# Patient Record
Sex: Female | Born: 1935 | Race: White | Hispanic: Yes | Smoking: Never smoker
Health system: Southern US, Community
[De-identification: ages and names within clinical notes are randomized; demographics above are authoritative.]

## PROBLEM LIST (undated history)

## (undated) DIAGNOSIS — I495 Sick sinus syndrome: Secondary | ICD-10-CM

## (undated) DIAGNOSIS — S329XXA Fracture of unspecified parts of lumbosacral spine and pelvis, initial encounter for closed fracture: Secondary | ICD-10-CM

## (undated) DIAGNOSIS — E785 Hyperlipidemia, unspecified: Secondary | ICD-10-CM

## (undated) DIAGNOSIS — S72112A Displaced fracture of greater trochanter of left femur, initial encounter for closed fracture: Secondary | ICD-10-CM

## (undated) DIAGNOSIS — I10 Essential (primary) hypertension: Secondary | ICD-10-CM

## (undated) DIAGNOSIS — R296 Repeated falls: Secondary | ICD-10-CM

## (undated) DIAGNOSIS — F419 Anxiety disorder, unspecified: Secondary | ICD-10-CM

## (undated) DIAGNOSIS — E039 Hypothyroidism, unspecified: Secondary | ICD-10-CM

## (undated) DIAGNOSIS — I480 Paroxysmal atrial fibrillation: Secondary | ICD-10-CM

## (undated) DIAGNOSIS — F329 Major depressive disorder, single episode, unspecified: Secondary | ICD-10-CM

## (undated) DIAGNOSIS — S72002A Fracture of unspecified part of neck of left femur, initial encounter for closed fracture: Secondary | ICD-10-CM

## (undated) DIAGNOSIS — E559 Vitamin D deficiency, unspecified: Secondary | ICD-10-CM

## (undated) DIAGNOSIS — Z950 Presence of cardiac pacemaker: Secondary | ICD-10-CM

## (undated) DIAGNOSIS — M810 Age-related osteoporosis without current pathological fracture: Secondary | ICD-10-CM

## (undated) DIAGNOSIS — J309 Allergic rhinitis, unspecified: Secondary | ICD-10-CM

## (undated) DIAGNOSIS — S0300XA Dislocation of jaw, unspecified side, initial encounter: Secondary | ICD-10-CM

## (undated) DIAGNOSIS — I639 Cerebral infarction, unspecified: Secondary | ICD-10-CM

## (undated) DIAGNOSIS — F32A Depression, unspecified: Secondary | ICD-10-CM

## (undated) DIAGNOSIS — N189 Chronic kidney disease, unspecified: Secondary | ICD-10-CM

## (undated) HISTORY — DX: Essential (primary) hypertension: I10

## (undated) HISTORY — DX: Major depressive disorder, single episode, unspecified: F32.9

## (undated) HISTORY — PX: TONSILLECTOMY AND ADENOIDECTOMY: SHX28

## (undated) HISTORY — DX: Depression, unspecified: F32.A

## (undated) HISTORY — DX: Hyperlipidemia, unspecified: E78.5

## (undated) HISTORY — DX: Chronic kidney disease, unspecified: N18.9

## (undated) HISTORY — DX: Presence of cardiac pacemaker: Z95.0

## (undated) HISTORY — DX: Sick sinus syndrome: I49.5

## (undated) HISTORY — DX: Hypothyroidism, unspecified: E03.9

## (undated) HISTORY — DX: Vitamin D deficiency, unspecified: E55.9

## (undated) HISTORY — PX: GALLBLADDER SURGERY: SHX652

## (undated) HISTORY — DX: Age-related osteoporosis without current pathological fracture: M81.0

## (undated) HISTORY — DX: Repeated falls: R29.6

## (undated) HISTORY — DX: Anxiety disorder, unspecified: F41.9

## (undated) HISTORY — DX: Paroxysmal atrial fibrillation: I48.0

## (undated) HISTORY — PX: CHOLECYSTECTOMY: SHX55

---

## 2005-01-04 HISTORY — PX: PACEMAKER INSERTION: SHX728

## 2010-09-23 ENCOUNTER — Ambulatory Visit: Payer: Medicare (Managed Care) | Attending: Orthopaedic Surgery | Admitting: Occupational Therapy

## 2010-09-23 DIAGNOSIS — IMO0001 Reserved for inherently not codable concepts without codable children: Secondary | ICD-10-CM | POA: Insufficient documentation

## 2010-09-23 DIAGNOSIS — M6281 Muscle weakness (generalized): Secondary | ICD-10-CM | POA: Insufficient documentation

## 2010-09-23 DIAGNOSIS — R269 Unspecified abnormalities of gait and mobility: Secondary | ICD-10-CM | POA: Insufficient documentation

## 2010-09-24 ENCOUNTER — Ambulatory Visit: Payer: Medicare (Managed Care) | Admitting: Occupational Therapy

## 2010-09-30 ENCOUNTER — Ambulatory Visit: Payer: Medicare (Managed Care) | Admitting: Occupational Therapy

## 2010-10-08 ENCOUNTER — Ambulatory Visit: Payer: Medicare (Managed Care) | Admitting: Occupational Therapy

## 2010-10-13 ENCOUNTER — Ambulatory Visit: Payer: Medicare (Managed Care) | Admitting: Occupational Therapy

## 2010-10-15 ENCOUNTER — Ambulatory Visit: Payer: Medicare (Managed Care) | Admitting: Occupational Therapy

## 2010-10-18 ENCOUNTER — Ambulatory Visit: Payer: Medicare (Managed Care) | Admitting: Occupational Therapy

## 2010-10-20 ENCOUNTER — Ambulatory Visit: Payer: Medicare (Managed Care) | Admitting: Occupational Therapy

## 2010-10-26 ENCOUNTER — Ambulatory Visit: Payer: Medicare Other | Attending: Orthopaedic Surgery | Admitting: Occupational Therapy

## 2010-10-26 DIAGNOSIS — IMO0001 Reserved for inherently not codable concepts without codable children: Secondary | ICD-10-CM | POA: Insufficient documentation

## 2010-10-26 DIAGNOSIS — M6281 Muscle weakness (generalized): Secondary | ICD-10-CM | POA: Insufficient documentation

## 2010-10-26 DIAGNOSIS — R269 Unspecified abnormalities of gait and mobility: Secondary | ICD-10-CM | POA: Insufficient documentation

## 2010-10-28 ENCOUNTER — Ambulatory Visit: Payer: Medicare Other | Admitting: Physical Therapy

## 2010-10-28 ENCOUNTER — Inpatient Hospital Stay (INDEPENDENT_AMBULATORY_CARE_PROVIDER_SITE_OTHER)
Admission: RE | Admit: 2010-10-28 | Discharge: 2010-10-28 | Disposition: A | Discharge status: Home / Self Care | Payer: Medicare Other | Source: Ambulatory Visit | Attending: Emergency Medicine | Admitting: Emergency Medicine

## 2010-10-28 ENCOUNTER — Ambulatory Visit: Payer: Medicare Other | Admitting: Occupational Therapy

## 2010-10-28 DIAGNOSIS — I10 Essential (primary) hypertension: Secondary | ICD-10-CM

## 2010-10-28 LAB — POCT I-STAT, CHEM 8
Calcium, Ion: 1.2 mmol/L (ref 1.12–1.32)
Creatinine, Ser: 0.8 mg/dL (ref 0.50–1.10)
Glucose, Bld: 103 mg/dL — ABNORMAL HIGH (ref 70–99)
Hemoglobin: 15.3 g/dL — ABNORMAL HIGH (ref 12.0–15.0)
Sodium: 142 mEq/L (ref 135–145)
TCO2: 28 mmol/L (ref 0–100)

## 2010-11-02 ENCOUNTER — Ambulatory Visit: Payer: Medicare Other | Admitting: Occupational Therapy

## 2010-11-05 ENCOUNTER — Ambulatory Visit: Payer: Medicare Other | Admitting: Physical Therapy

## 2010-11-05 ENCOUNTER — Ambulatory Visit: Payer: Medicare Other | Admitting: Occupational Therapy

## 2010-11-09 ENCOUNTER — Ambulatory Visit: Payer: Medicare Other | Admitting: Physical Therapy

## 2010-11-09 ENCOUNTER — Ambulatory Visit: Payer: Medicare Other | Admitting: Occupational Therapy

## 2010-11-12 ENCOUNTER — Ambulatory Visit: Payer: Medicare Other | Admitting: Occupational Therapy

## 2010-11-16 ENCOUNTER — Ambulatory Visit: Payer: Medicare Other | Admitting: Physical Therapy

## 2010-11-16 ENCOUNTER — Ambulatory Visit: Payer: Medicare Other | Admitting: Occupational Therapy

## 2010-11-18 ENCOUNTER — Ambulatory Visit: Payer: Medicare Other | Admitting: Physical Therapy

## 2010-11-18 ENCOUNTER — Ambulatory Visit: Payer: Medicare Other | Admitting: Occupational Therapy

## 2010-11-23 ENCOUNTER — Ambulatory Visit: Payer: Medicare Other | Admitting: Physical Therapy

## 2010-11-23 ENCOUNTER — Ambulatory Visit: Payer: Medicare Other | Attending: Orthopaedic Surgery | Admitting: Occupational Therapy

## 2010-11-23 DIAGNOSIS — R269 Unspecified abnormalities of gait and mobility: Secondary | ICD-10-CM | POA: Insufficient documentation

## 2010-11-23 DIAGNOSIS — IMO0001 Reserved for inherently not codable concepts without codable children: Secondary | ICD-10-CM | POA: Insufficient documentation

## 2010-11-23 DIAGNOSIS — M6281 Muscle weakness (generalized): Secondary | ICD-10-CM | POA: Insufficient documentation

## 2010-11-25 ENCOUNTER — Ambulatory Visit: Payer: Medicare Other | Admitting: Occupational Therapy

## 2010-11-25 ENCOUNTER — Ambulatory Visit: Payer: Medicare Other | Admitting: Physical Therapy

## 2010-11-26 ENCOUNTER — Ambulatory Visit: Payer: Medicare Other | Admitting: Family Medicine

## 2010-11-30 ENCOUNTER — Ambulatory Visit: Payer: Medicare Other | Admitting: Occupational Therapy

## 2010-11-30 ENCOUNTER — Ambulatory Visit: Payer: Medicare Other | Admitting: Physical Therapy

## 2010-12-03 ENCOUNTER — Ambulatory Visit: Payer: Medicare Other | Admitting: Physical Therapy

## 2010-12-03 ENCOUNTER — Ambulatory Visit: Payer: Medicare Other | Admitting: Occupational Therapy

## 2010-12-06 ENCOUNTER — Telehealth: Payer: Self-pay | Admitting: Family Medicine

## 2010-12-06 NOTE — Telephone Encounter (Signed)
I have no record that this patient is established with our practice.  I am unable to offer medical advice to people who are not yet established patients. Yvonne Buck

## 2010-12-06 NOTE — Telephone Encounter (Signed)
Yvonne Buck has an appt on Friday with Dr. Mauricio Po.  She had a fall on Friday and went to an Orthopedic MD for a fractured left arm.  She has also hit her head and now has headaches.  The daughter would like to talk to Dr. Mauricio Po.

## 2010-12-06 NOTE — Telephone Encounter (Signed)
Fwd. To Dr.Breen .Latorsha Curling  

## 2010-12-07 ENCOUNTER — Ambulatory Visit: Payer: Medicare Other | Admitting: Occupational Therapy

## 2010-12-07 ENCOUNTER — Ambulatory Visit: Payer: Medicare Other | Admitting: Physical Therapy

## 2010-12-09 ENCOUNTER — Ambulatory Visit: Payer: Medicare Other | Admitting: Physical Therapy

## 2010-12-09 ENCOUNTER — Ambulatory Visit: Payer: Medicare Other | Admitting: Occupational Therapy

## 2010-12-10 ENCOUNTER — Ambulatory Visit (INDEPENDENT_AMBULATORY_CARE_PROVIDER_SITE_OTHER): Payer: Medicare Other | Admitting: Family Medicine

## 2010-12-10 ENCOUNTER — Encounter: Payer: Self-pay | Admitting: Family Medicine

## 2010-12-10 DIAGNOSIS — E039 Hypothyroidism, unspecified: Secondary | ICD-10-CM

## 2010-12-10 DIAGNOSIS — W19XXXA Unspecified fall, initial encounter: Secondary | ICD-10-CM | POA: Insufficient documentation

## 2010-12-10 DIAGNOSIS — J45909 Unspecified asthma, uncomplicated: Secondary | ICD-10-CM | POA: Insufficient documentation

## 2010-12-10 DIAGNOSIS — I10 Essential (primary) hypertension: Secondary | ICD-10-CM | POA: Insufficient documentation

## 2010-12-10 LAB — COMPREHENSIVE METABOLIC PANEL
Alkaline Phosphatase: 66 U/L (ref 39–117)
BUN: 33 mg/dL — ABNORMAL HIGH (ref 6–23)
Creat: 0.68 mg/dL (ref 0.50–1.10)
Glucose, Bld: 117 mg/dL — ABNORMAL HIGH (ref 70–99)
Sodium: 143 mEq/L (ref 135–145)
Total Bilirubin: 0.4 mg/dL (ref 0.3–1.2)

## 2010-12-10 LAB — CBC
HCT: 41.5 % (ref 36.0–46.0)
Hemoglobin: 12.9 g/dL (ref 12.0–15.0)
MCV: 89.4 fL (ref 78.0–100.0)
RBC: 4.64 MIL/uL (ref 3.87–5.11)
WBC: 8 10*3/uL (ref 4.0–10.5)

## 2010-12-10 MED ORDER — CLONIDINE HCL 0.1 MG PO TABS: 0.1000 mg | ORAL_TABLET | Freq: Every day | ORAL | Status: DC

## 2010-12-10 MED ORDER — ALBUTEROL SULFATE HFA 108 (90 BASE) MCG/ACT IN AERS: 2.0000 | INHALATION_SPRAY | Freq: Four times a day (QID) | RESPIRATORY_TRACT | Status: DC | PRN

## 2010-12-10 MED ORDER — FLUTICASONE PROPIONATE (INHAL) 50 MCG/BLIST IN AEPB: 1.0000 | INHALATION_SPRAY | Freq: Two times a day (BID) | RESPIRATORY_TRACT | Status: DC

## 2010-12-10 MED ORDER — MAGNESIUM 250 MG PO TABS: 1.0000 | ORAL_TABLET | Freq: Once | ORAL | Status: DC | PRN

## 2010-12-10 MED ORDER — ASPIRIN 81 MG PO TBDP: 81.0000 mg | ORAL_TABLET | Freq: Every day | ORAL | Status: AC

## 2010-12-10 MED ORDER — HYDROCHLOROTHIAZIDE 25 MG PO TABS: 25.0000 mg | ORAL_TABLET | Freq: Every day | ORAL | Status: DC

## 2010-12-10 MED ORDER — LEVOTHYROXINE SODIUM 25 MCG PO TABS: 25.0000 ug | ORAL_TABLET | Freq: Every day | ORAL | Status: DC

## 2010-12-10 MED ORDER — NITROGLYCERIN 0.4 MG SL SUBL: 0.4000 mg | SUBLINGUAL_TABLET | SUBLINGUAL | Status: DC | PRN

## 2010-12-10 MED ORDER — B-12 2000 MCG PO TABS: 1.0000 | ORAL_TABLET | Freq: Every day | ORAL | Status: DC

## 2010-12-10 MED ORDER — LOSARTAN POTASSIUM 50 MG PO TABS: 50.0000 mg | ORAL_TABLET | Freq: Every day | ORAL | Status: DC

## 2010-12-10 MED ORDER — THEOPHYLLINE ER 400 MG PO TB24: 400.0000 mg | ORAL_TABLET | Freq: Every day | ORAL | Status: DC

## 2010-12-10 MED ORDER — AMLODIPINE BESYLATE 5 MG PO TABS: 5.0000 mg | ORAL_TABLET | Freq: Every day | ORAL | Status: DC

## 2010-12-10 MED ORDER — SIMVASTATIN 40 MG PO TABS: 40.0000 mg | ORAL_TABLET | Freq: Every evening | ORAL | Status: DC

## 2010-12-10 NOTE — Assessment & Plan Note (Addendum)
Patient reports that her hypertension has been very difficult to control. She reports that without the clonidine she has pressures in the 190s. She has a pacemaker since 2006 and is unable to give me more details about this. Again, we will request records from her primary physician in [place] and she plans to establish cardiac consultation with Dr. Eden Emms, who is her daughter's cardiologist as well. We are checking a direct LDL as well as a CBC and C&S on her today, and refilling her hydrochlorothiazide, amlodipine, Cozaar, and Catapres.

## 2010-12-10 NOTE — Progress Notes (Signed)
  Subjective:    Patient ID: Yvonne Buck, female    DOB: 1929/11/08, 77 y.o.   MRN: [id number]  HPI Ms. Broers comes in today for a new patient visit. She is accompanied by her daughter. She moved here from [place] about 2 months ago and comes to establish care and get refills on her chronic medications. Her previous doctor in [place] was Dr. Ramond Marrow. Her primary concern today is that she fell on October 12 and hit the left side of her head when getting out of her car in the garage. She feels that this was a trip and fall he was not preceded by any feeling of presyncope he was not preceded by any palpitations or chest pain.  She banged the left side of her head and had a knot on her head which has receded in the ensuing week. Prior to this fall she had fallen one other time in July in [place] and had fractured her left forearm requiring orthopedic surgery and hardware. She has seen an orthopedist here in [place] since moving here.  Additionally she had a pacemaker placed in [place] in 2006 and would like to establish with a cardiologist to have her pacemaker checked. Her daughter is a patient of Dr. Eden Emms on and she would like to he seen by him.  Her chronic medical conditions include hypertension which she reports has been difficult to control. She also has a long history of asthma and previously was using Flovent discus 250 mcg daily as well as Symbicort but has not needed to take these medications since moving to [place]. She has had hyperlipidemia and hypothyroidism in the past as well. The only medication allergy she reports is penicillin which gives her rashes.  Social history: The patient recently moved from [place] to [place]. She has never been a smoker. She does not drink alcohol. She is a widow.  Family history: Patient's mother died at age 71 and had diabetes. She has a brother who also has died and he too had diabetes. She has a sister who died of a MI.  Review  of Systems Her review of systems is negative for weight changes fevers or chills chest pain or pressure palpitations shortness of breath cough sputum production abdominal pain dysuria loss of control of bowel or bladder. She denies diplopia or headache or neck pain since her fall.    Objective:   Physical Exam Well-appearing in no acute distress able to get up on the exam table without assistance.  HEENT neck is supple without cervical adenopathy there is a small fleshy bump just to the left of midline at the corona it is nontender there is no ecchymosis. Her extraocular muscles are intact her pupils are round and reactive to light. Heart: Regular S1-S2 without any extra sounds or murmurs appreciated  Pulmonary: Good air movement with clear breath sounds bilaterally no wheezes rales or rhonchi are appreciated.  Abdomen soft nontender and without masses.  Neurologic: Full handgrip symmetrically, sensation in both hands and feet is grossly full and symmetrical. Gait is unremarkable. Walks without assistance.  Musculoskeletal: She has positive dorsalis pedis pulses in both feet without any ankle edema. Full-strength with dorsiflexion and plantar flexion of both feet which are symmetrical. She has no tenderness in her cervical spine, on her head, or with shoulder shrug.        Assessment & Plan:

## 2010-12-10 NOTE — Assessment & Plan Note (Signed)
Patient has suffered 2 falls in the past 6 months, both of which appeared to have been mechanical falls. This fall in July occurred in [place] while she was performing yard work with heavy long instruments and did not could not maintain her balance. The fall last week occurred while getting out of her car possibly with some mechanical obstructions limiting her movement. She gives no history of palpitations or presyncopal events.  We may consider a physical therapy consult for further assessment if she continues to have problems with unsteadiness or any future falls. She reports that she has had a DEXA scan that I determined that she had normal bone density 2 years ago in [place] We will request records from that DEXA scan as well as from her primary doctor in [place]

## 2010-12-10 NOTE — Patient Instructions (Signed)
Fue un Marketing executive.  Estoy mandando hacer una serie de laboratorios y Engineer, maintenance (IT) contacto con los [place].  QUiero [employer] records de sus medicos en [place] sobretodo de su medico de atencion primaria Dr Ramond Marrow, y [place] del estudio de la densidad de los huesos que se hizo Tax adviser.  Recomiendo que haga una cita con el Dr. Eden Emms para atenderle al [place].  RELEASE OF INFORMATION FOR DR VALE, PRIOR PRIMARY CARE DOCTOR.   SPECIFY BONE DENSITY REPORT OF 2 YRS AGO.

## 2010-12-10 NOTE — Assessment & Plan Note (Signed)
Patient reports she has been on a low dose of Levophed her oxygen for about one year. She says she has never had her TSH rechecked since initiating the medication. We will recheck a TSH today as part of her lab work and I will contact her with the results.

## 2010-12-10 NOTE — Progress Notes (Signed)
Addended by: Barbaraann Barthel on: 12/10/2010 02:33 PM   Modules accepted: Orders

## 2010-12-13 ENCOUNTER — Telehealth: Payer: Self-pay | Admitting: Family Medicine

## 2010-12-13 ENCOUNTER — Encounter: Payer: Self-pay | Admitting: Family Medicine

## 2010-12-13 DIAGNOSIS — E039 Hypothyroidism, unspecified: Secondary | ICD-10-CM

## 2010-12-13 NOTE — Telephone Encounter (Signed)
Called patient at number on file.  No answer, answering machine.  I left a message that I would send lab results to patient's home.  In the letter I plan to ask her to stop LT4 and recheck TSH in 6-8 weeks.

## 2010-12-14 ENCOUNTER — Ambulatory Visit: Payer: Medicare Other | Admitting: Physical Therapy

## 2010-12-14 ENCOUNTER — Ambulatory Visit: Payer: Medicare Other | Admitting: Occupational Therapy

## 2010-12-15 ENCOUNTER — Telehealth: Payer: Self-pay | Admitting: Internal Medicine

## 2010-12-15 NOTE — Telephone Encounter (Signed)
Spoke with pt dtr, she is a new device pt for dr allred and she was calling to let us know the provider that [place] the pacer [place] is Dr Boris Lown [phone]. Will try to contact to get records Deliah Goody

## 2010-12-15 NOTE — Telephone Encounter (Signed)
Pt daughter calling to speak with Stanton Kidney regarding pt Visual merchandiser.   Pt daughter asks that msg be sent to [facility], Dr. Fabio Bering nurse, per Debra's request b/c she needs information about pt pacemaker.   Please call back.

## 2010-12-16 ENCOUNTER — Ambulatory Visit: Payer: Medicare Other | Admitting: Occupational Therapy

## 2010-12-16 ENCOUNTER — Ambulatory Visit: Payer: Medicare Other | Admitting: Physical Therapy

## 2010-12-17 NOTE — Telephone Encounter (Signed)
Left message for pt dtr, release of information paperwork mailed to pt to sign for [place] to be able to get her records Yvonne Buck

## 2010-12-20 ENCOUNTER — Emergency Department (HOSPITAL_COMMUNITY)
Admission: EM | Admit: 2010-12-20 | Discharge: 2010-12-20 | Disposition: A | Discharge status: Home / Self Care | Payer: Medicare Other | Attending: Emergency Medicine | Admitting: Emergency Medicine

## 2010-12-20 ENCOUNTER — Inpatient Hospital Stay (INDEPENDENT_AMBULATORY_CARE_PROVIDER_SITE_OTHER)
Admission: RE | Admit: 2010-12-20 | Discharge: 2010-12-20 | Disposition: A | Discharge status: Home / Self Care | Payer: Medicare Other | Source: Ambulatory Visit | Attending: Family Medicine | Admitting: Family Medicine

## 2010-12-20 ENCOUNTER — Emergency Department (HOSPITAL_COMMUNITY): Payer: Medicare Other

## 2010-12-20 DIAGNOSIS — W19XXXA Unspecified fall, initial encounter: Secondary | ICD-10-CM

## 2010-12-20 DIAGNOSIS — R221 Localized swelling, mass and lump, neck: Secondary | ICD-10-CM | POA: Insufficient documentation

## 2010-12-20 DIAGNOSIS — S0003XA Contusion of scalp, initial encounter: Secondary | ICD-10-CM | POA: Insufficient documentation

## 2010-12-20 DIAGNOSIS — M542 Cervicalgia: Secondary | ICD-10-CM | POA: Insufficient documentation

## 2010-12-20 DIAGNOSIS — R51 Headache: Secondary | ICD-10-CM | POA: Insufficient documentation

## 2010-12-20 DIAGNOSIS — Z79899 Other long term (current) drug therapy: Secondary | ICD-10-CM | POA: Insufficient documentation

## 2010-12-20 DIAGNOSIS — E039 Hypothyroidism, unspecified: Secondary | ICD-10-CM | POA: Insufficient documentation

## 2010-12-20 DIAGNOSIS — S1093XA Contusion of unspecified part of neck, initial encounter: Secondary | ICD-10-CM | POA: Insufficient documentation

## 2010-12-20 DIAGNOSIS — W1809XA Striking against other object with subsequent fall, initial encounter: Secondary | ICD-10-CM | POA: Insufficient documentation

## 2010-12-20 DIAGNOSIS — I10 Essential (primary) hypertension: Secondary | ICD-10-CM | POA: Insufficient documentation

## 2010-12-20 DIAGNOSIS — R220 Localized swelling, mass and lump, head: Secondary | ICD-10-CM | POA: Insufficient documentation

## 2010-12-20 DIAGNOSIS — Z7982 Long term (current) use of aspirin: Secondary | ICD-10-CM | POA: Insufficient documentation

## 2010-12-20 DIAGNOSIS — E785 Hyperlipidemia, unspecified: Secondary | ICD-10-CM | POA: Insufficient documentation

## 2010-12-20 DIAGNOSIS — Y92009 Unspecified place in unspecified non-institutional (private) residence as the place of occurrence of the external cause: Secondary | ICD-10-CM | POA: Insufficient documentation

## 2010-12-20 DIAGNOSIS — J45909 Unspecified asthma, uncomplicated: Secondary | ICD-10-CM | POA: Insufficient documentation

## 2010-12-20 DIAGNOSIS — S0990XA Unspecified injury of head, initial encounter: Secondary | ICD-10-CM

## 2010-12-20 DIAGNOSIS — Z950 Presence of cardiac pacemaker: Secondary | ICD-10-CM | POA: Insufficient documentation

## 2010-12-20 DIAGNOSIS — S139XXA Sprain of joints and ligaments of unspecified parts of neck, initial encounter: Secondary | ICD-10-CM | POA: Insufficient documentation

## 2010-12-20 LAB — CBC
HCT: 41.4 % (ref 36.0–46.0)
Hemoglobin: 13.5 g/dL (ref 12.0–15.0)
MCHC: 32.6 g/dL (ref 30.0–36.0)
RBC: 4.81 MIL/uL (ref 3.87–5.11)

## 2010-12-20 LAB — URINALYSIS, ROUTINE W REFLEX MICROSCOPIC
Bilirubin Urine: NEGATIVE
Hgb urine dipstick: NEGATIVE
Nitrite: NEGATIVE
Specific Gravity, Urine: 1.01 (ref 1.005–1.030)
pH: 8 (ref 5.0–8.0)

## 2010-12-20 LAB — BASIC METABOLIC PANEL
BUN: 9 mg/dL (ref 6–23)
CO2: 27 mEq/L (ref 19–32)
GFR calc non Af Amer: 88 mL/min — ABNORMAL LOW (ref >90)
Glucose, Bld: 92 mg/dL (ref 70–99)
Potassium: 3.2 mEq/L — ABNORMAL LOW (ref 3.5–5.1)
Sodium: 140 mEq/L (ref 135–145)

## 2010-12-20 LAB — DIFFERENTIAL
Basophils Absolute: 0 10*3/uL (ref 0.0–0.1)
Lymphocytes Relative: 13 % (ref 12–46)
Monocytes Absolute: 0.5 10*3/uL (ref 0.1–1.0)
Monocytes Relative: 5 % (ref 3–12)
Neutro Abs: 7.2 10*3/uL (ref 1.7–7.7)
Neutrophils Relative %: 69 % (ref 43–77)

## 2010-12-21 ENCOUNTER — Encounter: Payer: Medicare Other | Admitting: Occupational Therapy

## 2010-12-21 ENCOUNTER — Encounter: Payer: Medicare Other | Admitting: Physical Therapy

## 2010-12-23 ENCOUNTER — Ambulatory Visit: Payer: Medicare Other | Admitting: Occupational Therapy

## 2010-12-23 ENCOUNTER — Ambulatory Visit: Payer: Medicare Other | Attending: Orthopaedic Surgery | Admitting: Physical Therapy

## 2010-12-23 DIAGNOSIS — M6281 Muscle weakness (generalized): Secondary | ICD-10-CM | POA: Insufficient documentation

## 2010-12-23 DIAGNOSIS — IMO0001 Reserved for inherently not codable concepts without codable children: Secondary | ICD-10-CM | POA: Insufficient documentation

## 2010-12-23 DIAGNOSIS — R269 Unspecified abnormalities of gait and mobility: Secondary | ICD-10-CM | POA: Insufficient documentation

## 2010-12-28 ENCOUNTER — Ambulatory Visit: Payer: Medicare Other | Admitting: Occupational Therapy

## 2010-12-28 ENCOUNTER — Ambulatory Visit: Payer: Medicare Other | Admitting: Physical Therapy

## 2010-12-30 ENCOUNTER — Ambulatory Visit: Payer: Medicare Other | Admitting: Occupational Therapy

## 2010-12-30 ENCOUNTER — Ambulatory Visit: Payer: Medicare Other | Admitting: Physical Therapy

## 2011-01-04 ENCOUNTER — Encounter: Payer: Medicare Other | Admitting: Occupational Therapy

## 2011-01-04 ENCOUNTER — Encounter: Payer: Medicare Other | Admitting: Physical Therapy

## 2011-01-06 ENCOUNTER — Encounter: Payer: Self-pay | Admitting: Internal Medicine

## 2011-01-06 ENCOUNTER — Ambulatory Visit (INDEPENDENT_AMBULATORY_CARE_PROVIDER_SITE_OTHER): Payer: Medicare Other | Admitting: Internal Medicine

## 2011-01-06 ENCOUNTER — Ambulatory Visit: Payer: Medicare Other | Admitting: Occupational Therapy

## 2011-01-06 ENCOUNTER — Ambulatory Visit: Payer: Medicare Other | Admitting: Physical Therapy

## 2011-01-06 VITALS — BP 177/86 | HR 78 | Ht 61.0 in | Wt 111.8 lb

## 2011-01-06 DIAGNOSIS — I495 Sick sinus syndrome: Secondary | ICD-10-CM

## 2011-01-06 DIAGNOSIS — I10 Essential (primary) hypertension: Secondary | ICD-10-CM

## 2011-01-06 DIAGNOSIS — I4891 Unspecified atrial fibrillation: Secondary | ICD-10-CM | POA: Insufficient documentation

## 2011-01-06 LAB — PACEMAKER DEVICE OBSERVATION
AL IMPEDENCE PM: 530 Ohm
ATRIAL PACING PM: 3
BAMS-0001: 170 {beats}/min
BAMS-0002: 0 ms
BAMS-0003: 70 {beats}/min
DEVICE MODEL PM: 764926
RV LEAD AMPLITUDE: 6 mv
VENTRICULAR PACING PM: 1

## 2011-01-06 MED ORDER — AMLODIPINE BESYLATE 10 MG PO TABS: 10.0000 mg | ORAL_TABLET | Freq: Every day | ORAL | Status: DC

## 2011-01-06 NOTE — Assessment & Plan Note (Signed)
Above goal Increase norvasc to 10mg  daily

## 2011-01-06 NOTE — Progress Notes (Signed)
Yvonne Buck is a pleasant 77 y.o. patient with a h/o atrial fibrillation and tachycardia bradycardia sp PPM Conservation officer, historic buildings) in [place] who presents today to establish care in the Electrophysiology device clinic.   She had symptomatic bradycardia for which she underwent PPM implantation 01/04/2005 in [place].  She was living in [place] but went to [place] to be with her daughter for the procedure.  She also reports having a heart cath and that she had "two blockages". The patient reports doing very well since having a pacemaker implanted and remains very active despite her age.  Her biggest concern is with recent frequent falls.   Today, she  denies symptoms of palpitations, chest pain, shortness of breath, orthopnea, PND, lower extremity edema, dizziness, presyncope, syncope, or neurologic sequela.  The patientis tolerating medications without difficulties and is otherwise without complaint today.   Past Medical History  Diagnosis Date  . Tachycardia-bradycardia syndrome     s/p [employer] PPM implant in [place]  . Paroxysmal atrial fibrillation   . Hypertension   . Frequent falls   . Asthma   . Hyperlipidemia     Past Surgical History  Procedure Date  . Pacemaker insertion 01/04/2005    [employer] [place] PPM 1290 [phone]), GDT [phone] atrial lead and 4457 V lead all implanted in [place]  . Cholecystectomy     History   Social History  . Marital Status: Widowed    Spouse Name: N/A    Number of Children: N/A  . Years of Education: N/A   Occupational History  . Not on file.   Social History Main Topics  . Smoking status: Never Smoker   . Smokeless tobacco: Never Used  . Alcohol Use: No  . Drug Use: No  . Sexually Active: Not on file   Other Topics Concern  . Not on file   Social History Narrative   Recently moved to [place] to live with her daughter.  Previously lived in [place].  Daughter lived previously in [place].    Family History  Problem  Relation Age of Onset  . Diabetes      Allergies  Allergen Reactions  . Penicillins     Current Outpatient Prescriptions  Medication Sig Dispense Refill  . albuterol (PROVENTIL HFA;VENTOLIN HFA) 108 (90 BASE) MCG/ACT inhaler Inhale 2 puffs into the lungs every 6 (six) hours as needed for wheezing or shortness of breath.  1 Inhaler  4  . amLODipine (NORVASC) 5 MG tablet Take 1 tablet (5 mg total) by mouth daily.  30 tablet  11  . Aspirin (ADULT ASPIRIN LOW STRENGTH) 81 MG EC tablet Take 1 tablet (81 mg total) by mouth daily.      . cloNIDine (CATAPRES) 0.1 MG tablet Take 1 tablet (0.1 mg total) by mouth at bedtime.  30 tablet  5  . Cyanocobalamin (B-12) 2000 MCG TABS Take 1 tablet by mouth daily.  30 tablet  0  . fluticasone (FLOVENT DISKUS) 50 MCG/BLIST diskus inhaler Inhale 1 puff into the lungs 2 (two) times daily.  1 Inhaler  12  . hydrochlorothiazide (HYDRODIURIL) 25 MG tablet Take 1 tablet (25 mg total) by mouth daily.  30 tablet  11  . losartan (COZAAR) 50 MG tablet Take 1 tablet (50 mg total) by mouth daily.  30 tablet  11  . Magnesium 250 MG TABS Take 1 tablet (250 mg total) by mouth once as needed.  1 each  0  . nitroGLYCERIN (NITROQUICK) 0.4  MG SL tablet Place 1 tablet (0.4 mg total) under the tongue every 5 (five) minutes as needed for chest pain.  90 tablet  12  . simvastatin (ZOCOR) 40 MG tablet Take 1 tablet (40 mg total) by mouth every evening.  30 tablet  11  . theophylline (UNIPHYL) 400 MG 24 hr tablet Take 1 tablet (400 mg total) by mouth daily.  30 tablet  6    ROS- all systems are reviewed and negative except as per HPI  Physical Exam: Filed Vitals:   01/06/11 1139  BP: 177/86  Pulse: 78  Height: 5\' 1"  (1.549 m)  Weight: 111 lb 12.8 oz (50.712 kg)    GEN- The patient is thin and elderly appearing, alert and oriented x 3 today.   Head- normocephalic, atraumatic Eyes-  Sclera clear, conjunctiva pink Ears- hearing intact Oropharynx- clear Neck- supple, no  JVP Lymph- no cervical lymphadenopathy Lungs- normal work of breathing few expiratory wheezes otherwise clear Chest- pacemaker pocket is well healed Heart- Regular rate and rhythm, no murmurs, rubs or gallops, PMI not laterally displaced GI- soft, NT, ND, + BS Extremities- no clubbing, cyanosis, or edema MS- diffuse muscle atrophy, walks slowly with rolling walker Skin- no rash or lesion Psych- euthymic mood, full affect Neuro- strength and sensation are intact  Pacemaker interrogation- reviewed in detail today,  See PACEART report  Assessment and Plan:

## 2011-01-06 NOTE — Patient Instructions (Signed)
Your physician wants you to follow-up in: 6 months in the device clinic You will receive a reminder letter in the mail two months in advance. If you don't receive a letter, please call our office to schedule the follow-up appointment.  Your physician has recommended you make the following change in your medication:  1) Increase Amlodipine to 10mg  daily

## 2011-01-06 NOTE — Assessment & Plan Note (Signed)
Normal pacemaker function See Arita Miss Art report No changes today  Return to device clinic in 6 months if not back in [place]

## 2011-01-06 NOTE — Assessment & Plan Note (Signed)
Maintaining sinus rhythm Poor candidate for coumadin due to multiple recent falls No changes

## 2011-01-07 ENCOUNTER — Telehealth: Payer: Self-pay | Admitting: Internal Medicine

## 2011-01-07 NOTE — Telephone Encounter (Addendum)
Release of Information faxed to Medical Records Dept with Shari Heritage @ [phone]   01/07/11/km  Refaxed ROI to [facility]el's Office @ [phone]  01/14/11/km  Received ROI back Via fax, Dr.Patel's Office has NO records on this Pt, Let Tresa Endo Know 01/18/11  Michail Sermon wit Dr.Allred about Dr.Patels office has no records on Pt, Dr.Allred said don't worry about it, I am Shredding ROI  01/20/11/km

## 2011-01-12 ENCOUNTER — Ambulatory Visit: Payer: Medicare Other | Admitting: *Deleted

## 2011-01-12 ENCOUNTER — Ambulatory Visit: Payer: Medicare Other | Admitting: Physical Therapy

## 2011-01-18 ENCOUNTER — Ambulatory Visit: Payer: Medicare Other | Admitting: Occupational Therapy

## 2011-01-18 ENCOUNTER — Ambulatory Visit: Payer: Medicare Other | Admitting: Physical Therapy

## 2011-01-20 ENCOUNTER — Ambulatory Visit: Payer: Medicare Other | Admitting: Occupational Therapy

## 2011-01-20 ENCOUNTER — Ambulatory Visit: Payer: Medicare Other | Admitting: Physical Therapy

## 2011-01-25 ENCOUNTER — Encounter: Payer: Medicare Other | Admitting: Occupational Therapy

## 2011-01-25 ENCOUNTER — Ambulatory Visit: Payer: Medicare Other | Admitting: Physical Therapy

## 2011-01-27 ENCOUNTER — Ambulatory Visit: Payer: Medicare Other | Admitting: Physical Therapy

## 2011-01-27 ENCOUNTER — Encounter: Payer: Medicare Other | Admitting: Occupational Therapy

## 2011-02-01 ENCOUNTER — Ambulatory Visit: Payer: Medicare Other | Admitting: Physical Therapy

## 2011-02-01 ENCOUNTER — Encounter: Payer: Medicare Other | Admitting: Occupational Therapy

## 2011-02-03 ENCOUNTER — Ambulatory Visit: Payer: Medicare Other | Admitting: Physical Therapy

## 2011-02-03 ENCOUNTER — Encounter: Payer: Medicare Other | Admitting: Occupational Therapy

## 2011-08-10 ENCOUNTER — Telehealth: Payer: Self-pay | Admitting: Internal Medicine

## 2011-08-10 ENCOUNTER — Encounter: Payer: Self-pay | Admitting: Internal Medicine

## 2011-08-10 NOTE — Telephone Encounter (Signed)
08-10-11 the lady who answered the phone, said this pt does not live there, sent past due letter/mt

## 2012-02-09 NOTE — Telephone Encounter (Signed)
02-09-12 called home number back and pt moved to [facility] rico/mt

## 2012-09-20 ENCOUNTER — Emergency Department (HOSPITAL_COMMUNITY)
Admission: EM | Admit: 2012-09-20 | Discharge: 2012-09-20 | Disposition: A | Discharge status: Home / Self Care | Payer: Medicare Other | Attending: Emergency Medicine | Admitting: Emergency Medicine

## 2012-09-20 ENCOUNTER — Encounter (HOSPITAL_COMMUNITY): Payer: Self-pay | Admitting: *Deleted

## 2012-09-20 DIAGNOSIS — Z79899 Other long term (current) drug therapy: Secondary | ICD-10-CM | POA: Insufficient documentation

## 2012-09-20 DIAGNOSIS — S0300XA Dislocation of jaw, unspecified side, initial encounter: Secondary | ICD-10-CM | POA: Insufficient documentation

## 2012-09-20 DIAGNOSIS — I4891 Unspecified atrial fibrillation: Secondary | ICD-10-CM | POA: Insufficient documentation

## 2012-09-20 DIAGNOSIS — E785 Hyperlipidemia, unspecified: Secondary | ICD-10-CM | POA: Insufficient documentation

## 2012-09-20 DIAGNOSIS — Y929 Unspecified place or not applicable: Secondary | ICD-10-CM | POA: Insufficient documentation

## 2012-09-20 DIAGNOSIS — Z8679 Personal history of other diseases of the circulatory system: Secondary | ICD-10-CM | POA: Insufficient documentation

## 2012-09-20 DIAGNOSIS — I10 Essential (primary) hypertension: Secondary | ICD-10-CM | POA: Insufficient documentation

## 2012-09-20 DIAGNOSIS — X58XXXA Exposure to other specified factors, initial encounter: Secondary | ICD-10-CM | POA: Insufficient documentation

## 2012-09-20 DIAGNOSIS — Z9181 History of falling: Secondary | ICD-10-CM | POA: Insufficient documentation

## 2012-09-20 DIAGNOSIS — Y9389 Activity, other specified: Secondary | ICD-10-CM | POA: Insufficient documentation

## 2012-09-20 DIAGNOSIS — Z880 Allergy status to penicillin: Secondary | ICD-10-CM | POA: Insufficient documentation

## 2012-09-20 DIAGNOSIS — J45909 Unspecified asthma, uncomplicated: Secondary | ICD-10-CM | POA: Insufficient documentation

## 2012-09-20 DIAGNOSIS — Z950 Presence of cardiac pacemaker: Secondary | ICD-10-CM | POA: Insufficient documentation

## 2012-09-20 NOTE — ED Notes (Signed)
The pt  Went to sleep around 1900 tonight.  The pt woke up with swelling to her lower lip and cannot speak very well.  She reports that she has had this previously and calls it lock jaw.  .  The daughter is speaking for her.  The pt nods yes to difficulty breathing sats ok

## 2012-09-20 NOTE — ED Provider Notes (Signed)
CSN: [id number]     Arrival date & time 09/20/12  0030 History     First MD Initiated Contact with Patient 09/20/12 0047     Chief Complaint  Patient presents with  . Oral Swelling    HPI Patient reports she awoke this morning with "my job locked".  She states she's had a history of childhood dislocation before in the past.  Usually it spontaneously resolves.  Tonight she was unable to get her job back in and that she presents the ER for evaluation.  Her pain is mild in severity at this time.  No trauma.  She was in her normal state of health when she went to bed.  No other complaints.  Past Medical History  Diagnosis Date  . Tachycardia-bradycardia syndrome     s/p [employer] PPM implant in [place]  . Paroxysmal atrial fibrillation   . Hypertension   . Frequent falls   . Asthma   . Hyperlipidemia    Past Surgical History  Procedure Laterality Date  . Pacemaker insertion  01/04/2005    [employer] [place] PPM 1290 [phone]), GDT [phone] atrial lead and 4457 V lead all implanted in [place]  . Cholecystectomy     Family History  Problem Relation Age of Onset  . Diabetes     History  Substance Use Topics  . Smoking status: Never Smoker   . Smokeless tobacco: Never Used  . Alcohol Use: No   OB History   Grav Para Term Preterm Abortions TAB SAB Ect Mult Living                 Review of Systems  All other systems reviewed and are negative.    Allergies  Penicillins  Home Medications   Current Outpatient Rx  Name  Route  Sig  Dispense  Refill  . EXPIRED: albuterol (PROVENTIL HFA;VENTOLIN HFA) 108 (90 BASE) MCG/ACT inhaler   Inhalation   Inhale 2 puffs into the lungs every 6 (six) hours as needed for wheezing or shortness of breath.   1 Inhaler   4   . EXPIRED: amLODipine (NORVASC) 10 MG tablet   Oral   Take 1 tablet (10 mg total) by mouth daily.   30 tablet   11   . EXPIRED: cloNIDine (CATAPRES) 0.1 MG tablet   Oral   Take 1 tablet (0.1 mg total) by  mouth at bedtime.   30 tablet   5   . Cyanocobalamin (B-12) 2000 MCG TABS   Oral   Take 1 tablet by mouth daily.   30 tablet   0   . EXPIRED: fluticasone (FLOVENT DISKUS) 50 MCG/BLIST diskus inhaler   Inhalation   Inhale 1 puff into the lungs 2 (two) times daily.   1 Inhaler   12   . EXPIRED: hydrochlorothiazide (HYDRODIURIL) 25 MG tablet   Oral   Take 1 tablet (25 mg total) by mouth daily.   30 tablet   11   . EXPIRED: losartan (COZAAR) 50 MG tablet   Oral   Take 1 tablet (50 mg total) by mouth daily.   30 tablet   11   . Magnesium 250 MG TABS   Oral   Take 1 tablet (250 mg total) by mouth once as needed.   1 each   0   . EXPIRED: nitroGLYCERIN (NITROQUICK) 0.4 MG SL tablet   Sublingual   Place 1 tablet (0.4 mg total) under the tongue every 5 (five) minutes as needed  for chest pain.   90 tablet   12   . EXPIRED: simvastatin (ZOCOR) 40 MG tablet   Oral   Take 1 tablet (40 mg total) by mouth every evening.   30 tablet   11   . EXPIRED: theophylline (UNIPHYL) 400 MG 24 hr tablet   Oral   Take 1 tablet (400 mg total) by mouth daily.   30 tablet   6    BP 218/91  Pulse 79  Temp(Src) 97.9 F (36.6 C) (Oral)  Resp 26  SpO2 100% Physical Exam  Nursing note and vitals reviewed. Constitutional: She is oriented to person, place, and time. She appears well-developed and well-nourished. No distress.  HENT:  Head: Normocephalic and atraumatic.  Obvious jaw dislocation of bilaterally. Easily relocated without any difficulty.  No dental injury  Eyes: EOM are normal.  Neck: Normal range of motion.  Cardiovascular: Normal rate, regular rhythm and normal heart sounds.   Pulmonary/Chest: Effort normal and breath sounds normal.  Abdominal: Soft. She exhibits no distension. There is no tenderness.  Musculoskeletal: Normal range of motion.  Neurological: She is alert and oriented to person, place, and time.  Skin: Skin is warm and dry.  Psychiatric: She has a normal  mood and affect. Judgment normal.    ED Course   Procedures (including critical care time)  Reduction of dislocation Date/Time: 07/22/2012 11:41 AM Performed by: Lyanne Co Authorized by: Lyanne Co Consent: Verbal consent obtained. Risks and benefits: risks, benefits and alternatives were discussed Consent given by: patient Required items: required blood products, implants, devices, and special equipment available Time out: Immediately prior to procedure a "time out" was called to verify the correct patient, procedure, equipment, support staff and site/side marked as required. Patient sedated: no Vitals: Vital signs were monitored during sedation. Patient tolerance: Patient tolerated the procedure well with no immediate complications. Joint: bilateral Temporal Mandibular Joint Reduction technique: thumbs on molars bilaterally, tolerated procedure well    Labs Reviewed - No data to display No results found. 1. Dislocated jaw, initial encounter     MDM  Very simple reduction of bilateral temporal mandibular joints.  Patient tolerated procedure well.  Discharge home in good condition.  No indication for imaging.  The patient will need to followup with ENT as this is been a recurrent issue.  Lyanne Co, MD 09/20/12 [phone]

## 2012-10-20 ENCOUNTER — Encounter (HOSPITAL_COMMUNITY): Payer: Self-pay

## 2012-10-20 ENCOUNTER — Emergency Department (HOSPITAL_COMMUNITY)
Admission: EM | Admit: 2012-10-20 | Discharge: 2012-10-20 | Disposition: A | Discharge status: Home / Self Care | Payer: Medicare Other | Attending: Emergency Medicine | Admitting: Emergency Medicine

## 2012-10-20 ENCOUNTER — Emergency Department (HOSPITAL_COMMUNITY): Payer: Medicare Other

## 2012-10-20 DIAGNOSIS — Z8679 Personal history of other diseases of the circulatory system: Secondary | ICD-10-CM | POA: Insufficient documentation

## 2012-10-20 DIAGNOSIS — Y939 Activity, unspecified: Secondary | ICD-10-CM | POA: Insufficient documentation

## 2012-10-20 DIAGNOSIS — J45909 Unspecified asthma, uncomplicated: Secondary | ICD-10-CM | POA: Insufficient documentation

## 2012-10-20 DIAGNOSIS — Z7982 Long term (current) use of aspirin: Secondary | ICD-10-CM | POA: Insufficient documentation

## 2012-10-20 DIAGNOSIS — Y929 Unspecified place or not applicable: Secondary | ICD-10-CM | POA: Insufficient documentation

## 2012-10-20 DIAGNOSIS — Z79899 Other long term (current) drug therapy: Secondary | ICD-10-CM | POA: Insufficient documentation

## 2012-10-20 DIAGNOSIS — Z880 Allergy status to penicillin: Secondary | ICD-10-CM | POA: Insufficient documentation

## 2012-10-20 DIAGNOSIS — E785 Hyperlipidemia, unspecified: Secondary | ICD-10-CM | POA: Insufficient documentation

## 2012-10-20 DIAGNOSIS — S0300XA Dislocation of jaw, unspecified side, initial encounter: Secondary | ICD-10-CM | POA: Insufficient documentation

## 2012-10-20 DIAGNOSIS — S0300XD Dislocation of jaw, unspecified side, subsequent encounter: Secondary | ICD-10-CM

## 2012-10-20 DIAGNOSIS — X58XXXA Exposure to other specified factors, initial encounter: Secondary | ICD-10-CM | POA: Insufficient documentation

## 2012-10-20 DIAGNOSIS — Z9181 History of falling: Secondary | ICD-10-CM | POA: Insufficient documentation

## 2012-10-20 DIAGNOSIS — I10 Essential (primary) hypertension: Secondary | ICD-10-CM | POA: Insufficient documentation

## 2012-10-20 MED ORDER — LORAZEPAM 2 MG/ML IJ SOLN
0.5000 mg | Freq: Once | INTRAMUSCULAR | Status: AC
  Administered 2012-10-20: 0.5 mg via INTRAMUSCULAR
  Filled 2012-10-20: qty 1

## 2012-10-20 MED ORDER — METHOCARBAMOL 500 MG PO TABS
1000.0000 mg | ORAL_TABLET | Freq: Once | ORAL | Status: AC
  Administered 2012-10-20: 1000 mg via ORAL
  Filled 2012-10-20: qty 2

## 2012-10-20 MED ORDER — METHOCARBAMOL 500 MG PO TABS: 500.0000 mg | ORAL_TABLET | Freq: Two times a day (BID) | ORAL | Status: DC

## 2012-10-20 NOTE — ED Provider Notes (Signed)
CSN: [id number]     Arrival date & time 10/20/12  0405 History   First MD Initiated Contact with Patient 10/20/12 [phone]     Chief Complaint  Patient presents with  . Jaw Pain   (Consider location/radiation/quality/duration/timing/severity/associated sxs/prior Treatment) Patient is a 79 y.o. female presenting with mouth injury. The history is provided by the patient. No language interpreter was used.  Mouth Injury This is a recurrent problem. Episode onset: unknown. The problem occurs constantly. The problem has not changed since onset.Pertinent negatives include no chest pain, no abdominal pain, no headaches and no shortness of breath. Nothing aggravates the symptoms. Nothing relieves the symptoms. She has tried nothing for the symptoms. The treatment provided no relief.  Repeat dislocation   Past Medical History  Diagnosis Date  . Tachycardia-bradycardia syndrome     s/p [employer] PPM implant in [place]  . Paroxysmal atrial fibrillation   . Hypertension   . Frequent falls   . Asthma   . Hyperlipidemia    Past Surgical History  Procedure Laterality Date  . Pacemaker insertion  01/04/2005    [employer] [place] PPM 1290 [phone]), GDT [phone] atrial lead and 4457 V lead all implanted in [place]  . Cholecystectomy     Family History  Problem Relation Age of Onset  . Diabetes     History  Substance Use Topics  . Smoking status: Never Smoker   . Smokeless tobacco: Never Used  . Alcohol Use: No   OB History   Grav Para Term Preterm Abortions TAB SAB Ect Mult Living                 Review of Systems  Respiratory: Negative for shortness of breath.   Cardiovascular: Negative for chest pain.  Gastrointestinal: Negative for abdominal pain.  Neurological: Negative for headaches.  All other systems reviewed and are negative.    Allergies  Penicillins  Home Medications   Current Outpatient Rx  Name  Route  Sig  Dispense  Refill  . amLODipine (NORVASC) 5 MG tablet  Oral   Take 5 mg by mouth daily.         Marland Kitchen aspirin EC 81 MG tablet   Oral   Take 81 mg by mouth daily.         . cholecalciferol (VITAMIN D) 1000 UNITS tablet   Oral   Take 1,000 Units by mouth daily.         . fluticasone (FLOVENT DISKUS) 50 MCG/BLIST diskus inhaler   Inhalation   Inhale 1 puff into the lungs 2 (two) times daily.   1 Inhaler   12   . hydrochlorothiazide (HYDRODIURIL) 25 MG tablet   Oral   Take 1 tablet (25 mg total) by mouth daily.   30 tablet   11   . losartan (COZAAR) 50 MG tablet   Oral   Take 1 tablet (50 mg total) by mouth daily.   30 tablet   11   . nitroGLYCERIN (NITROQUICK) 0.4 MG SL tablet   Sublingual   Place 1 tablet (0.4 mg total) under the tongue every 5 (five) minutes as needed for chest pain.   90 tablet   12   . simvastatin (ZOCOR) 40 MG tablet   Oral   Take 1 tablet (40 mg total) by mouth every evening.   30 tablet   11   . theophylline (UNIPHYL) 400 MG 24 hr tablet   Oral   Take 1 tablet (400 mg total)  by mouth daily.   30 tablet   6    BP 171/92  Pulse 84  Temp(Src) 97.7 F (36.5 C) (Axillary)  Resp 18  SpO2 95% Physical Exam  Constitutional: She is oriented to person, place, and time. She appears well-developed and well-nourished. No distress.  HENT:  Head: Normocephalic and atraumatic.  Mouth/Throat: Oropharynx is clear and moist.  Jaw dislocated  Eyes: Conjunctivae are normal. Pupils are equal, round, and reactive to light.  Neck: Normal range of motion. Neck supple.  Cardiovascular: Normal rate, regular rhythm and intact distal pulses.   Pulmonary/Chest: Effort normal and breath sounds normal. She has no wheezes. She has no rales.  Abdominal: Soft. Bowel sounds are normal. There is no tenderness. There is no rebound and no guarding.  Musculoskeletal: Normal range of motion.  Neurological: She is alert and oriented to person, place, and time.  Skin: Skin is warm and dry.  Psychiatric: She has a normal  mood and affect.    ED Course  Procedures (including critical care time) Labs Review Labs Reviewed - No data to display Imaging Review Dg Orthopantogram  10/20/2012   *RADIOLOGY REPORT*  Clinical Data: Jaw pain.  ORTHOPANTOGRAM/PANORAMIC  Comparison: CT head 12/20/2010.  Findings: Limited examination due to blurring, overpenetration laterally, and underpenetration centrally.  No acute fracture detected.  There is extensive dental work, which would be better assessed by bite wing radiography.  No large periapical erosions detected.  IMPRESSION: 1.Technically limited study, particularly for evaluating the mandibular angles.  No acute abnormality detected. 2.  Numerous dental caries which would be better assessed by bite wing radiography.   Original Report Authenticated By: Tiburcio Pea    MDM  Dr. Jeanice Lim to see    Sonda Coppens K Creedence Heiss-Rasch, MD 10/20/12 [phone]

## 2012-10-20 NOTE — ED Notes (Signed)
Pt discharged home. Instructed to follow up with [facility]. No further questions. Wheelchair to triage.

## 2012-10-20 NOTE — Consult Note (Signed)
Oral and Maxillofacial Surgery Consultation Note  Reason for Consult: Bilateral Jaw Dislocation Referring Physician: Dr. Markham Jordan is an 79 y.o. female.  HPI: The patient is a 79 year old female that has had multiple subluxations and dislocations in the past.  She reports that she was sleeping in bed and yawned jaw dislocated.  She was unable to reduce and was taken to ER by her daughter.  At [facility] ER reduction was attempted by Dr. Nicanor Alcon after Ativan and Robaxin; however, this was unsuccessful. I was consulted for evaluation.  PMHx:  Past Medical History  Diagnosis Date  . Tachycardia-bradycardia syndrome     s/p [employer] PPM implant in [place]  . Paroxysmal atrial fibrillation   . Hypertension   . Frequent falls   . Asthma   . Hyperlipidemia     PSx:  Past Surgical History  Procedure Laterality Date  . Pacemaker insertion  01/04/2005    [employer] [place] PPM 1290 [phone]), GDT [phone] atrial lead and 4457 V lead all implanted in [place]  . Cholecystectomy      Family Hx:  Family History  Problem Relation Age of Onset  . Diabetes      Social Hx:  reports that she has never smoked. She has never used smokeless tobacco. She reports that she does not drink alcohol or use illicit drugs.  Allergies:  Allergies  Allergen Reactions  . Penicillins Other (See Comments)    unknown    Medications: I have reviewed the patient's current medications.  Labs: No results found for this or any previous visit (from the past 48 hour(s)).  Radiology: Dg Orthopantogram  10/20/2012   *RADIOLOGY REPORT*  Clinical Data: Jaw pain.  ORTHOPANTOGRAM/PANORAMIC  Comparison: CT head 12/20/2010.  Findings: Limited examination due to blurring, overpenetration laterally, and underpenetration centrally.  No acute fracture detected.  There is extensive dental work, which would be better assessed by bite wing radiography.  No large periapical erosions detected.  IMPRESSION:  1.Technically limited study, particularly for evaluating the mandibular angles.  No acute abnormality detected. 2.  Numerous dental caries which would be better assessed by bite wing radiography.   Original Report Authenticated By: Tiburcio Pea    WJX:BJYNWGNFA items are noted in HPI.  Vital Signs: BP 171/92  Pulse 84  Temp(Src) 97.7 F (36.5 C) (Axillary)  Resp 18  SpO2 95%  Physical Exam: General appearance: alert and cooperative The patient has an open bite due to anterior dislocation of the temporomandibular joint. She can not reduce the jaw on her own.  Assessment/Plan: 79 year old female with bilateral dislocation of the Temporomandibular Joint. 1. I reduced the dislocated joint by applying inferior and posterior pressure. 2. Recommend follow up at [facility] for possible EM guided botox of the lateral pterygoid muscles. The patient will likely dislocate again and maxillomandibular fixation (wiring her jaw) is not an appropriate option at the age of 37.    Yvonne Buck,Yvonne Buck  10/20/2012, 6:48 AM

## 2012-10-20 NOTE — ED Notes (Signed)
0415  Pt arrives with jaw dislocation.  Has had problems with this before but has been unable to find a specialist to help with the problem

## 2012-11-16 ENCOUNTER — Ambulatory Visit (INDEPENDENT_AMBULATORY_CARE_PROVIDER_SITE_OTHER): Payer: Medicare Other | Admitting: Neurology

## 2012-11-16 ENCOUNTER — Encounter: Payer: Self-pay | Admitting: Neurology

## 2012-11-16 VITALS — BP 170/79 | HR 89 | Ht 60.0 in | Wt 194.0 lb

## 2012-11-16 DIAGNOSIS — G8114 Spastic hemiplegia affecting left nondominant side: Secondary | ICD-10-CM | POA: Insufficient documentation

## 2012-11-16 DIAGNOSIS — G8110 Spastic hemiplegia affecting unspecified side: Secondary | ICD-10-CM

## 2012-11-16 NOTE — Progress Notes (Signed)
[facility]  PATIENT: Yvonne Buck DOB: 1929-12-21  HISTORICAL  [name] is a 79 yo RH Female, accompanied by her daughter [name] for evaluation of left-sided difficulty, gait difficulty.  She had past medical history of pacemaker placement, hypertension, hyperlipidemia, depression, anxiety.  She began to have gradual onset gait difficulty, frequent morning since 2012, she fell January 2013, broke her left wrist, require surgery, she has increased gait difficulty since, she fell multiple times this year, complains of left leg and the left arm weakness, she also complains of numbness from the left calf down, has urinary urgency, wear a diaper now, chronic constipation,  She suffered a sever fall in June 2014,, with bilateral lower extremity bruise, has moved from [place] to be with her daughter in [place],  She denies swallowing difficulty, no language difficulty,   REVIEW OF SYSTEMS: Full 14 system review of systems performed and notable only for chest pain, swelling in legs, trouble swallowing, shortness of breath, wheezing, incontinence, constipation, easy bruising, feeling hot, feeling cold, achy muscles, running nose, numbness, difficulty swelling, left-sided weakness, insomnia, restless legs, depression, anxiety, not enough sleep, decreased energy, disinterested in activities  ALLERGIES: Allergies  Allergen Reactions  . Penicillins Other (See Comments)    unknown    HOME MEDICATIONS: Outpatient Prescriptions Prior to Visit  Medication Sig Dispense Refill  . amLODipine (NORVASC) 5 MG tablet Take 10 mg by mouth daily.       Marland Kitchen aspirin EC 81 MG tablet Take 81 mg by mouth daily.      . cholecalciferol (VITAMIN D) 1000 UNITS tablet Take 1,000 Units by mouth daily.      . nitroGLYCERIN (NITROQUICK) 0.4 MG SL tablet Place 1 tablet (0.4 mg total) under the tongue every 5 (five) minutes as needed for chest pain.  90 tablet  12  . simvastatin (ZOCOR)  40 MG tablet Take 1 tablet (40 mg total) by mouth every evening.  30 tablet  11  . theophylline (UNIPHYL) 400 MG 24 hr tablet Take 1 tablet (400 mg total) by mouth daily.  30 tablet  6  . fluticasone (FLOVENT DISKUS) 50 MCG/BLIST diskus inhaler Inhale 1 puff into the lungs 2 (two) times daily.  1 Inhaler  12  . hydrochlorothiazide (HYDRODIURIL) 25 MG tablet Take 1 tablet (25 mg total) by mouth daily.  30 tablet  11  . losartan (COZAAR) 50 MG tablet Take 1 tablet (50 mg total) by mouth daily.  30 tablet  11  . methocarbamol (ROBAXIN) 500 MG tablet Take 1 tablet (500 mg total) by mouth 2 (two) times daily.  20 tablet  0   No facility-administered medications prior to visit.    PAST MEDICAL HISTORY: Past Medical History  Diagnosis Date  . Tachycardia-bradycardia syndrome     s/p [employer] PPM implant in [place]  . Paroxysmal atrial fibrillation   . Hypertension   . Frequent falls   . Asthma   . Hyperlipidemia   . Depression   . Anxiety   . Pacemaker     PAST SURGICAL HISTORY: Past Surgical History  Procedure Laterality Date  . Pacemaker insertion  01/04/2005    [employer] [place] PPM 1290 [phone]), GDT [phone] atrial lead and 4457 V lead all implanted in [place]  . Cholecystectomy    . Gallbladder surgery    . Tonsillectomy and adenoidectomy      FAMILY HISTORY: Family History  Problem Relation Age of Onset  . Diabetes Mother   .  High blood pressure Mother   . Heart Problems Father     SOCIAL HISTORY:  History   Social History  . Marital Status: Widowed    Spouse Name: N/A    Number of Children: 2  . Years of Education: college   Occupational History  .      retired   Social History Main Topics  . Smoking status: Never Smoker   . Smokeless tobacco: Never Used  . Alcohol Use: No  . Drug Use: No  . Sexual Activity: Not on file    Social History Narrative   Recently moved to [place] to live with her daughter.  Previously lived in [place].      Daughter . [name].     Patient has some college education.   Right handed   Caffeine- None     PHYSICAL EXAM   Filed Vitals:   11/16/12 1023  BP: 170/79  Pulse: 89  Height: 5' (1.524 m)  Weight: 194 lb (87.998 kg)    Body mass index is 37.89 kg/(m^2).   Generalized: In no acute distress  Neck: Supple, no carotid bruits   Cardiac: Regular rate rhythm  Pulmonary: Clear to auscultation bilaterally  Musculoskeletal: No deformity  Neurological examination  Mentation: Alert oriented to time, place, history taking, and causual conversation  Cranial nerve II-XII: Pupils were equal round reactive to light extraocular movements were full, visual field were full on confrontational test. facial sensation and strength were normal. hearing was intact to finger rubbing bilaterally. Uvula tongue midline.  head turning and shoulder shrug and were normal and symmetric.Tongue protrusion into cheek strength was normal.  Motor: mild spastic left hemiparesis, left shoulder abduction 4+, elbow flexion 4+, elbow extension 4+, fixed left wrist, left wrist flexion 4+, extension 4+, grip 4+, left hip flexion 4+, knee flexion 4+, left ankle dorsiflexion 4+.  Sensory: decreased fine touch, pinprick at left distal leg, preserved vibratory sensation at toes.  Coordination: Normal finger to nose, heel-to-shin bilaterally there was no truncal ataxia  Gait: need to push up from seated position, cautious, mild unsteady, dragging left leg across the floor, left circumferential gait.  Romberg signs: Negative  Deep tendon reflexes: Brachioradialis 2/2, biceps 2/2, triceps 2/2, patellar 2/2, Achilles 2/2, plantar responses were flexor at right, extensor at left.   DIAGNOSTIC DATA (LABS, IMAGING, TESTING) - I reviewed patient records, labs, notes, testing and imaging myself where available.  Lab Results  Component Value Date   WBC 10.5 12/20/2010   HGB 13.5 12/20/2010   HCT 41.4  12/20/2010   MCV 86.1 12/20/2010   PLT 316 12/20/2010      Component Value Date/Time   NA 140 12/20/2010 1152   K 3.2* 12/20/2010 1152   CL 104 12/20/2010 1152   CO2 27 12/20/2010 1152   GLUCOSE 92 12/20/2010 1152   BUN 9 12/20/2010 1152   CREATININE 0.53 12/20/2010 1152   CREATININE 0.68 12/10/2010 1018   CALCIUM 9.9 12/20/2010 1152   PROT 6.8 12/10/2010 1018   ALBUMIN 4.2 12/10/2010 1018   AST 13 12/10/2010 1018   ALT 10 12/10/2010 1018   ALKPHOS 66 12/10/2010 1018   BILITOT 0.4 12/10/2010 1018   GFRNONAA 88* 12/20/2010 1152   GFRAA >90 12/20/2010 1152   Lab Results  Component Value Date   LDLDIRECT 83 12/10/2010    Lab Results  Component Value Date   TSH 0.308* 12/10/2010     ASSESSMENT AND PLAN  79 years old right-handed female,  with left-sided weakness, involving left upper, and lower extremity, gait difficulty,  1. most suggestive of right hemisphere pathology, such as right-sided stroke, 2 she has pacemaker, is not MRI candidate, we will proceed with CAT scan of the brain, 3  daily aspirin, keep physical therapy 4. complete evaluation with ultrasound of carotid artery, echocardiogram. 5 return to clinic in one month.   Levert Feinstein, M.D. Ph.D.  [facility] [address] [place], [place] [address] [phone]

## 2012-11-23 ENCOUNTER — Ambulatory Visit
Admission: RE | Admit: 2012-11-23 | Discharge: 2012-11-23 | Disposition: A | Discharge status: Home / Self Care | Payer: Medicare Other | Source: Ambulatory Visit | Attending: Neurology | Admitting: Neurology

## 2012-11-23 DIAGNOSIS — R413 Other amnesia: Secondary | ICD-10-CM

## 2012-11-23 DIAGNOSIS — G8114 Spastic hemiplegia affecting left nondominant side: Secondary | ICD-10-CM

## 2012-11-23 NOTE — Progress Notes (Signed)
Quick Note:  Please call patient, age related changes, no acute lesions. ______

## 2012-11-27 ENCOUNTER — Encounter: Payer: Self-pay | Admitting: Neurology

## 2012-11-27 NOTE — Progress Notes (Signed)
Quick Note:  I called and spoke to Ronal Fear for pt, gave her the results of CT. No stroke, only age related changes, no acute lesions. She verbalized understanding. ______

## 2012-12-06 ENCOUNTER — Encounter: Payer: Self-pay | Admitting: Neurology

## 2012-12-06 ENCOUNTER — Other Ambulatory Visit: Payer: Self-pay | Admitting: Neurology

## 2012-12-06 ENCOUNTER — Ambulatory Visit (INDEPENDENT_AMBULATORY_CARE_PROVIDER_SITE_OTHER): Payer: Medicare Other | Admitting: Neurology

## 2012-12-06 VITALS — BP 196/88 | HR 77 | Ht 61.0 in | Wt 110.0 lb

## 2012-12-06 DIAGNOSIS — E039 Hypothyroidism, unspecified: Secondary | ICD-10-CM

## 2012-12-06 DIAGNOSIS — I10 Essential (primary) hypertension: Secondary | ICD-10-CM

## 2012-12-06 DIAGNOSIS — G8114 Spastic hemiplegia affecting left nondominant side: Secondary | ICD-10-CM

## 2012-12-06 DIAGNOSIS — G8110 Spastic hemiplegia affecting unspecified side: Secondary | ICD-10-CM

## 2012-12-06 DIAGNOSIS — W19XXXS Unspecified fall, sequela: Secondary | ICD-10-CM

## 2012-12-06 NOTE — Progress Notes (Signed)
[facility]  PATIENT: Yvonne Buck DOB: 1929-05-01  HISTORICAL  Yvonne Buck is a 79 yo RH Female, accompanied by her daughter Yvonne for evaluation of left-sided difficulty, gait difficulty.  She had past medical history of pacemaker placement, hypertension, hyperlipidemia, depression, anxiety.  She began to have gradual onset gait difficulty, frequent fall since 2012, she fell January 2013, broke her left wrist, require surgery, she has increased gait difficulty since, she fell multiple times this year, complains of left leg and the left arm weakness, she also complains of numbness from the left calf down, has urinary urgency, wear a diaper now, chronic constipation,  She suffered a sever fall in June 2014,, with bilateral lower extremity bruise, has moved from [place] to be with her daughter in [place],  She denies swallowing difficulty, no language difficulty.  UPDATE Oct 16th 2014: I have reviewed CAT scan with patient and her daughter, there is evidence of moderate periventricular small vessel disease, no acute lesions,     REVIEW OF SYSTEMS: Full 14 system review of systems performed and notable only for chest pain, swelling in legs, trouble swallowing, shortness of breath, wheezing, incontinence, constipation, easy bruising, feeling hot, feeling cold, achy muscles, running nose, numbness, difficulty swelling, left-sided weakness, insomnia, restless legs, depression, anxiety, not enough sleep, decreased energy, disinterested in activities  ALLERGIES: Allergies  Allergen Reactions  . Penicillins Other (See Comments)    unknown    HOME MEDICATIONS: Outpatient Prescriptions Prior to Visit  Medication Sig Dispense Refill  . albuterol (PROVENTIL) (2.5 MG/3ML) 0.083% nebulizer solution Take 2.5 mg by nebulization. One puff twice daily      . amLODipine (NORVASC) 5 MG tablet Take 10 mg by mouth daily.       Marland Kitchen aspirin EC 81 MG tablet Take 81 mg by mouth  daily.      . cholecalciferol (VITAMIN D) 1000 UNITS tablet Take 1,000 Units by mouth daily.      Marland Kitchen losartan (COZAAR) 100 MG tablet Take 100 mg by mouth daily.      . montelukast (SINGULAIR) 10 MG tablet Take 10 mg by mouth at bedtime.      Marland Kitchen MONTELUKAST SODIUM PO Take by mouth as directed.      . nitroGLYCERIN (NITROQUICK) 0.4 MG SL tablet Place 1 tablet (0.4 mg total) under the tongue every 5 (five) minutes as needed for chest pain.  90 tablet  12  . simvastatin (ZOCOR) 40 MG tablet Take 1 tablet (40 mg total) by mouth every evening.  30 tablet  11  . theophylline (UNIPHYL) 400 MG 24 hr tablet Take 1 tablet (400 mg total) by mouth daily.  30 tablet  6   No facility-administered medications prior to visit.    PAST MEDICAL HISTORY: Past Medical History  Diagnosis Date  . Tachycardia-bradycardia syndrome     s/p [employer] PPM implant in [place]  . Paroxysmal atrial fibrillation   . Hypertension   . Frequent falls   . Asthma   . Hyperlipidemia   . Depression   . Anxiety   . Pacemaker     PAST SURGICAL HISTORY: Past Surgical History  Procedure Laterality Date  . Pacemaker insertion  01/04/2005    [employer] [place] PPM 1290 [phone]), GDT [phone] atrial lead and 4457 V lead all implanted in [place]  . Cholecystectomy    . Gallbladder surgery    . Tonsillectomy and adenoidectomy      FAMILY HISTORY: Family History  Problem Relation Age  of Onset  . Diabetes Mother   . High blood pressure Mother   . Heart Problems Father     SOCIAL HISTORY:  History   Social History  . Marital Status: Widowed    Spouse Name: N/A    Number of Children: 2  . Years of Education: college   Occupational History  .      retired   Social History Main Topics  . Smoking status: Never Smoker   . Smokeless tobacco: Never Used  . Alcohol Use: No  . Drug Use: No  . Sexual Activity: Not on file    Social History Narrative   Recently moved to [place] to live with her daughter.   Previously lived in [place].     Daughter . Arleene Norton Buck.     Patient has some college education.   Right handed   Caffeine- None     PHYSICAL EXAM   Filed Vitals:   12/06/12 1444  BP: 196/88  Pulse: 77  Height: 5\' 1"  (1.549 m)  Weight: 110 lb (49.896 kg)    Body mass index is 20.8 kg/(m^2).   Generalized: In no acute distress  Neck: Supple, no carotid bruits   Cardiac: Regular rate rhythm  Pulmonary: Clear to auscultation bilaterally  Musculoskeletal: No deformity  Neurological examination  Mentation: Alert oriented to time, place, history taking, and causual conversation  Cranial nerve II-XII: Pupils were equal round reactive to light extraocular movements were full, visual field were full on confrontational test. facial sensation and strength were normal. hearing was intact to finger rubbing bilaterally. Uvula tongue midline.  head turning and shoulder shrug and were normal and symmetric.Tongue protrusion into cheek strength was normal.  Motor: mild spastic left hemiparesis, left shoulder abduction 4+, elbow flexion 4+, elbow extension 4+, fixed left wrist, left wrist flexion 4+, extension 4+, grip 4+, left hip flexion 4+, knee flexion 4+, left ankle dorsiflexion 4+.  Sensory: decreased fine touch, pinprick at left distal leg, preserved vibratory sensation at toes.  Coordination: Normal finger to nose, heel-to-shin bilaterally there was no truncal ataxia  Gait: need to push up from seated position, cautious, mild unsteady, dragging left leg across the floor, left circumferential gait.  Romberg signs: Negative  Deep tendon reflexes: Brachioradialis 2/2, biceps 2/2, triceps 2/2, patellar 2/2, Achilles 2/2, plantar responses were flexor at right, extensor at left.   DIAGNOSTIC DATA (LABS, IMAGING, TESTING) - I reviewed patient records, labs, notes, testing and imaging myself where available.  Lab Results  Component Value Date   WBC 10.5 12/20/2010    HGB 13.5 12/20/2010   HCT 41.4 12/20/2010   MCV 86.1 12/20/2010   PLT 316 12/20/2010      Component Value Date/Time   NA 140 12/20/2010 1152   K 3.2* 12/20/2010 1152   CL 104 12/20/2010 1152   CO2 27 12/20/2010 1152   GLUCOSE 92 12/20/2010 1152   BUN 9 12/20/2010 1152   CREATININE 0.53 12/20/2010 1152   CREATININE 0.68 12/10/2010 1018   CALCIUM 9.9 12/20/2010 1152   PROT 6.8 12/10/2010 1018   ALBUMIN 4.2 12/10/2010 1018   AST 13 12/10/2010 1018   ALT 10 12/10/2010 1018   ALKPHOS 66 12/10/2010 1018   BILITOT 0.4 12/10/2010 1018   GFRNONAA 88* 12/20/2010 1152   GFRAA >90 12/20/2010 1152   Lab Results  Component Value Date   LDLDIRECT 83 12/10/2010    Lab Results  Component Value Date   TSH 0.308* 12/10/2010  ASSESSMENT AND PLAN  79 years old right-handed female, with spastic left hemiparesis,  left-sided weakness, involving left upper, and lower extremity, gait difficulty,  1. CAT scan of the brain has demonstrated periventricular small vessel disease, which could explain her spastic left hemiparesis,  2.  Botox injection of her spastic left lower extremity  3  continue moderate exercise 4. RTC in one month     Levert Feinstein, M.D. Ph.D.  [facility] [address] [place], [place] [address] [phone]

## 2012-12-14 ENCOUNTER — Emergency Department (HOSPITAL_COMMUNITY): Payer: Medicare Other

## 2012-12-14 ENCOUNTER — Emergency Department (HOSPITAL_COMMUNITY)
Admission: EM | Admit: 2012-12-14 | Discharge: 2012-12-14 | Disposition: A | Discharge status: Home / Self Care | Payer: Medicare Other | Attending: Emergency Medicine | Admitting: Emergency Medicine

## 2012-12-14 ENCOUNTER — Encounter (HOSPITAL_COMMUNITY): Payer: Self-pay | Admitting: Emergency Medicine

## 2012-12-14 DIAGNOSIS — S79919A Unspecified injury of unspecified hip, initial encounter: Secondary | ICD-10-CM | POA: Insufficient documentation

## 2012-12-14 DIAGNOSIS — R0789 Other chest pain: Secondary | ICD-10-CM

## 2012-12-14 DIAGNOSIS — S0003XA Contusion of scalp, initial encounter: Secondary | ICD-10-CM | POA: Insufficient documentation

## 2012-12-14 DIAGNOSIS — S0990XA Unspecified injury of head, initial encounter: Secondary | ICD-10-CM | POA: Insufficient documentation

## 2012-12-14 DIAGNOSIS — Z950 Presence of cardiac pacemaker: Secondary | ICD-10-CM | POA: Insufficient documentation

## 2012-12-14 DIAGNOSIS — Z9181 History of falling: Secondary | ICD-10-CM | POA: Insufficient documentation

## 2012-12-14 DIAGNOSIS — S8990XA Unspecified injury of unspecified lower leg, initial encounter: Secondary | ICD-10-CM | POA: Insufficient documentation

## 2012-12-14 DIAGNOSIS — W1809XA Striking against other object with subsequent fall, initial encounter: Secondary | ICD-10-CM | POA: Insufficient documentation

## 2012-12-14 DIAGNOSIS — R42 Dizziness and giddiness: Secondary | ICD-10-CM | POA: Insufficient documentation

## 2012-12-14 DIAGNOSIS — Y9389 Activity, other specified: Secondary | ICD-10-CM | POA: Insufficient documentation

## 2012-12-14 DIAGNOSIS — E876 Hypokalemia: Secondary | ICD-10-CM | POA: Insufficient documentation

## 2012-12-14 DIAGNOSIS — I10 Essential (primary) hypertension: Secondary | ICD-10-CM | POA: Insufficient documentation

## 2012-12-14 DIAGNOSIS — G8929 Other chronic pain: Secondary | ICD-10-CM | POA: Insufficient documentation

## 2012-12-14 DIAGNOSIS — Z7982 Long term (current) use of aspirin: Secondary | ICD-10-CM | POA: Insufficient documentation

## 2012-12-14 DIAGNOSIS — Z880 Allergy status to penicillin: Secondary | ICD-10-CM | POA: Insufficient documentation

## 2012-12-14 DIAGNOSIS — E785 Hyperlipidemia, unspecified: Secondary | ICD-10-CM | POA: Insufficient documentation

## 2012-12-14 DIAGNOSIS — Y9289 Other specified places as the place of occurrence of the external cause: Secondary | ICD-10-CM | POA: Insufficient documentation

## 2012-12-14 DIAGNOSIS — Z8659 Personal history of other mental and behavioral disorders: Secondary | ICD-10-CM | POA: Insufficient documentation

## 2012-12-14 DIAGNOSIS — W06XXXA Fall from bed, initial encounter: Secondary | ICD-10-CM | POA: Insufficient documentation

## 2012-12-14 DIAGNOSIS — J45901 Unspecified asthma with (acute) exacerbation: Secondary | ICD-10-CM | POA: Insufficient documentation

## 2012-12-14 DIAGNOSIS — W19XXXA Unspecified fall, initial encounter: Secondary | ICD-10-CM

## 2012-12-14 DIAGNOSIS — S298XXA Other specified injuries of thorax, initial encounter: Secondary | ICD-10-CM | POA: Insufficient documentation

## 2012-12-14 DIAGNOSIS — Z79899 Other long term (current) drug therapy: Secondary | ICD-10-CM | POA: Insufficient documentation

## 2012-12-14 LAB — CBC WITH DIFFERENTIAL/PLATELET
Eosinophils Absolute: 2.3 10*3/uL — ABNORMAL HIGH (ref 0.0–0.7)
Eosinophils Relative: 30 % — ABNORMAL HIGH (ref 0–5)
Lymphs Abs: 1.4 10*3/uL (ref 0.7–4.0)
MCH: 29.4 pg (ref 26.0–34.0)
MCHC: 33.4 g/dL (ref 30.0–36.0)
MCV: 88 fL (ref 78.0–100.0)
Monocytes Absolute: 0.4 10*3/uL (ref 0.1–1.0)
Platelets: 307 10*3/uL (ref 150–400)
RBC: 4.83 MIL/uL (ref 3.87–5.11)

## 2012-12-14 LAB — POCT I-STAT, CHEM 8
Creatinine, Ser: 0.9 mg/dL (ref 0.50–1.10)
Glucose, Bld: 127 mg/dL — ABNORMAL HIGH (ref 70–99)
Hemoglobin: 15 g/dL (ref 12.0–15.0)
Sodium: 144 mEq/L (ref 135–145)
TCO2: 29 mmol/L (ref 0–100)

## 2012-12-14 LAB — BASIC METABOLIC PANEL
CO2: 30 mEq/L (ref 19–32)
Calcium: 9.4 mg/dL (ref 8.4–10.5)
Creatinine, Ser: 0.63 mg/dL (ref 0.50–1.10)
GFR calc non Af Amer: 82 mL/min — ABNORMAL LOW (ref >90)
Glucose, Bld: 127 mg/dL — ABNORMAL HIGH (ref 70–99)

## 2012-12-14 MED ORDER — POTASSIUM CHLORIDE CRYS ER 20 MEQ PO TBCR
40.0000 meq | EXTENDED_RELEASE_TABLET | Freq: Once | ORAL | Status: AC
  Administered 2012-12-14: 40 meq via ORAL
  Filled 2012-12-14: qty 2

## 2012-12-14 MED ORDER — AEROCHAMBER PLUS W/MASK MISC
1.0000 | Freq: Once | Status: DC
  Filled 2012-12-14: qty 1

## 2012-12-14 MED ORDER — ALBUTEROL SULFATE HFA 108 (90 BASE) MCG/ACT IN AERS
2.0000 | INHALATION_SPRAY | Freq: Once | RESPIRATORY_TRACT | Status: AC
  Administered 2012-12-14: 2 via RESPIRATORY_TRACT
  Filled 2012-12-14: qty 6.7

## 2012-12-14 MED ORDER — ACETAMINOPHEN 325 MG PO TABS
650.0000 mg | ORAL_TABLET | Freq: Once | ORAL | Status: AC
  Administered 2012-12-14: 650 mg via ORAL
  Filled 2012-12-14: qty 2

## 2012-12-14 NOTE — ED Provider Notes (Signed)
CSN: [id number]     Arrival date & time 12/14/12  0820 History   First MD Initiated Contact with Patient 12/14/12 [phone]     Chief Complaint  Patient presents with  . Fall    fell backward and hit the back of her head   (Consider location/radiation/quality/duration/timing/severity/associated sxs/prior Treatment) HPI Yvonne Buck is a 79 y.o. female who presents to emergency department with complaint of a fall. Patient states she was trying to get out of bed and got tangled in her bed sheets and fell down to the ground backwards. Patient states that she hit the back of her head, and states having pain in her left hip and left knee. Patient did not have any loss of consciousness. Patient was found by her daughter who heard patient fall. Patient was ambulatory after the fall. Patient does have history of chronic left leg pain with associated numbness, states this is not new. Patient stated that after falling down she developed right-sided chest pain, dizziness, shortness of breath. Patient states that her symptoms are definitely started after she fell. Patient states that she has had an several recent falls, states unstable on her feet.  Past Medical History  Diagnosis Date  . Tachycardia-bradycardia syndrome     s/p [employer] PPM implant in [place]  . Paroxysmal atrial fibrillation   . Hypertension   . Frequent falls   . Asthma   . Hyperlipidemia   . Depression   . Anxiety   . Pacemaker    Past Surgical History  Procedure Laterality Date  . Pacemaker insertion  01/04/2005    [employer] [place] PPM 1290 [phone]), GDT [phone] atrial lead and 4457 V lead all implanted in [place]  . Cholecystectomy    . Gallbladder surgery    . Tonsillectomy and adenoidectomy     Family History  Problem Relation Age of Onset  . Diabetes Mother   . High blood pressure Mother   . Heart Problems Father    History  Substance Use Topics  . Smoking status: Never Smoker   . Smokeless tobacco:  Never Used  . Alcohol Use: No   OB History   Grav Para Term Preterm Abortions TAB SAB Ect Mult Living                 Review of Systems  Constitutional: Negative for fever and chills.  Respiratory: Positive for chest tightness and shortness of breath. Negative for cough.   Cardiovascular: Positive for chest pain. Negative for palpitations and leg swelling.  Gastrointestinal: Negative for nausea, vomiting, abdominal pain and diarrhea.  Genitourinary: Negative for dysuria, flank pain, vaginal bleeding, vaginal discharge, vaginal pain and pelvic pain.  Musculoskeletal: Positive for arthralgias and myalgias. Negative for back pain, joint swelling, neck pain and neck stiffness.  Skin: Negative for rash.  Neurological: Positive for dizziness and headaches. Negative for seizures, syncope, speech difficulty, weakness and numbness.  All other systems reviewed and are negative.    Allergies  Penicillins  Home Medications   Current Outpatient Rx  Name  Route  Sig  Dispense  Refill  . amLODipine (NORVASC) 5 MG tablet   Oral   Take 10 mg by mouth daily.          . montelukast (SINGULAIR) 10 MG tablet   Oral   Take 10 mg by mouth at bedtime.         . montelukast (SINGULAIR) 10 MG tablet   Oral   Take 10 mg by mouth  at bedtime.         . simvastatin (ZOCOR) 40 MG tablet   Oral   Take 1 tablet (40 mg total) by mouth every evening.   30 tablet   11   . theophylline (UNIPHYL) 400 MG 24 hr tablet   Oral   Take 1 tablet (400 mg total) by mouth daily.   30 tablet   6   . aspirin EC 81 MG tablet   Oral   Take 81 mg by mouth daily.         . cholecalciferol (VITAMIN D) 1000 UNITS tablet   Oral   Take 1,000 Units by mouth daily.         Marland Kitchen losartan (COZAAR) 100 MG tablet   Oral   Take 100 mg by mouth daily.         Marland Kitchen EXPIRED: nitroGLYCERIN (NITROQUICK) 0.4 MG SL tablet   Sublingual   Place 1 tablet (0.4 mg total) under the tongue every 5 (five) minutes as needed  for chest pain.   90 tablet   12    BP 190/85  Pulse 77  Temp(Src) 97.4 F (36.3 C) (Oral)  Resp 24  SpO2 96% Physical Exam  Nursing note and vitals reviewed. Constitutional: She appears well-developed and well-nourished. No distress.  HENT:  Head: Normocephalic.  Hematoma to the right posterior scalp  Eyes: Conjunctivae are normal.  Neck: Normal range of motion. Neck supple.  Cardiovascular: Normal rate, regular rhythm and normal heart sounds.   Dorsal pedal pulses intact bilaterally  Pulmonary/Chest: Effort normal. No respiratory distress. She has wheezes. She has no rales.  Right chest wall tenderness. No bruising or deformity  Abdominal: Soft. Bowel sounds are normal. She exhibits no distension. There is no tenderness. There is no rebound.  Musculoskeletal: She exhibits no edema.  Mild midline cervical tenderness, no thoracic or lumbar spine tenderness. FUll ROM of upper and lower extremities. Small abrasion to the left knee. Full ROM of the knee. Ambulatory with assist  Neurological: She is alert.  Skin: Skin is warm and dry.  Psychiatric: She has a normal mood and affect. Her behavior is normal.    ED Course  Procedures (including critical care time) Labs Review Labs Reviewed  CBC WITH DIFFERENTIAL - Abnormal; Notable for the following:    Eosinophils Relative 30 (*)    Eosinophils Absolute 2.3 (*)    All other components within normal limits  BASIC METABOLIC PANEL - Abnormal; Notable for the following:    Potassium 3.1 (*)    Glucose, Bld 127 (*)    GFR calc non Af Amer 82 (*)    All other components within normal limits  POCT I-STAT, CHEM 8 - Abnormal; Notable for the following:    Potassium 2.7 (*)    Glucose, Bld 127 (*)    All other components within normal limits  POCT I-STAT TROPONIN I   Imaging Review Dg Chest 2 View  12/14/2012   CLINICAL DATA:  Pain post trauma  EXAM: CHEST  2 VIEW  COMPARISON:  December 20, 2010  FINDINGS: The lungs are clear.  Heart size and pulmonary vascularity are normal. No adenopathy. Pacemaker leads are attached to the right atrium and right ventricle. There is evidence of an old healed fracture of the left posterior 9th rib. There is mild anterior wedging of an upper thoracic vertebral body which is stable. No pneumothorax.  IMPRESSION: No edema or consolidation.   Electronically Signed   By: Chrissie Noa  Margarita Grizzle M.D.   On: 12/14/2012 09:30   Ct Cervical Spine Wo Contrast  12/14/2012   CLINICAL DATA:  Pain post trauma  EXAM: CT HEAD WITHOUT CONTRAST  CT CERVICAL SPINE WITHOUT CONTRAST  TECHNIQUE: Multidetector CT imaging of the head and cervical spine was performed following the standard protocol without intravenous contrast. Multiplanar CT image reconstructions of the cervical spine were also generated.  COMPARISON:  brain CT November 23, 2012; cervical spine CT December 20, 2010  FINDINGS: CT HEAD FINDINGS  There is mild diffuse atrophy. There is no intracranial mass, hemorrhage, extra-axial fluid collection, or midline shift.  There is patchy small vessel disease throughout the centra semiovale bilaterally. There is no new gray-white compartment lesion. No acute infarct apparent.  There is a right parietal scalp hematoma. Bony calvarium appears intact. The mastoid air cells are clear.  CT CERVICAL SPINE FINDINGS  There is no fracture or spondylolisthesis. Prevertebral soft tissues and predental space regions are normal.  There is the moderately severe disc space narrowing at C6-7. There is milder disc space narrowing at C3-4, C4-5, and C5-6. There is facet hypertrophy at multiple levels, most marked at C6-7 on the left. No disc extrusion or stenosis. There is carotid artery calcification bilaterally.  IMPRESSION: CT head: Mild atrophy with patchy small vessel disease throughout the centra semiovale bilaterally, stable. No intracranial mass or hemorrhage. There is a right parietal scalp hematoma.  CT cervical spine: No fracture or  spondylolisthesis. Multilevel osteoarthritic change. Carotid artery calcification bilaterally.   Electronically Signed   By: Bretta Bang M.D.   On: 12/14/2012 09:29    EKG Interpretation     Ventricular Rate:  72 PR Interval:  164 QRS Duration: 105 QT Interval:  447 QTC Calculation: 489 R Axis:   82 Text Interpretation:  Age not entered, assumed to be  79 years old for purpose of ECG interpretation Sinus rhythm Borderline prolonged QT interval No significant change was found            MDM   1. Fall, initial encounter   2. Chest wall pain   3. Hypokalemia     Pt with mechanical fall at home. She is complaining of a headache, right sided chest pain in ER. ECG unremarkable. No LOC. On 81mg  aspirin daily otherwise no anticoagulants. Her exam unremarkable other than hematoma to the right scalp, mild cervical spine tenderness, and right chest wall tenderness. Her CT head and cspine negative for acute injury, CXR unremarkable. She is ambulatory. Blood work initially showed potassium of 2.7 on istat chem 8, regular BMET obtained, and it is 3.1. She was given of potassium in ED, will need close recheck. Her BP elevated in ED, advised to see her PCP for medication adjustment. Pain is atypical doubt ACS or PE. No hypoxia, tachycardia, tachypnea. She did have mild wheezing on chest exam, she did not take her albuterol this am, inhaler given in ED which helped her symptoms. At this time, believe cp is musculoskeletal from the fall. She is stable for d/c home with close follow up.    Filed Vitals:   12/14/12 0840 12/14/12 0857 12/14/12 1015  BP: 190/85  176/80  Pulse: 77 71 70  Temp: 97.4 F (36.3 C)    TempSrc: Oral    Resp: 24 28 22   SpO2: 96% 95% 96%      Rahi Chandonnet A Brieann Osinski, PA-C 12/14/12 1021

## 2012-12-14 NOTE — ED Notes (Signed)
Pt stated that she had chest pain once she was roomed in the ED.

## 2012-12-14 NOTE — ED Notes (Signed)
Chem 8 results shown to [employer].

## 2012-12-14 NOTE — ED Provider Notes (Signed)
Medical screening examination/treatment/procedure(s) were conducted as a shared visit with non-physician practitioner(s) and myself.  I personally evaluated the patient during the encounter.  EKG Interpretation     Ventricular Rate:  72 PR Interval:  164 QRS Duration: 105 QT Interval:  447 QTC Calculation: 489 R Axis:   82 Text Interpretation:  Age not entered, assumed to be  79 years old for purpose of ECG interpretation Sinus rhythm Borderline prolonged QT interval No significant change was found             Shon Baton, MD 12/14/12 [phone]

## 2012-12-14 NOTE — ED Notes (Signed)
Patient's daughter said she heard a loud noise and when she went to check on her mother.  The patient's daughter said she saw her mother on the floor with the sheets tangled in her feet.  The patient was getting out of bed and fell backward.  The patient is complaining of chest pain and pain to the back of the head.

## 2012-12-14 NOTE — ED Provider Notes (Signed)
Medical screening examination/treatment/procedure(s) were conducted as a shared visit with non-physician practitioner(s) and myself.  I personally evaluated the patient during the encounter.  EKG Interpretation     Ventricular Rate:  72 PR Interval:  164 QRS Duration: 105 QT Interval:  447 QTC Calculation: 489 R Axis:   82 Text Interpretation:  Age not entered, assumed to be  79 years old for purpose of ECG interpretation Sinus rhythm Borderline prolonged QT interval No significant change was found             This is an 79 year old female who presents following a mechanical fall. Patient reports headache and chest pain. She denies loss of consciousness. When asked what her chest pain feels like the patient states "it feels like I fell and hit chest." She denies any shortness of breath. Patient has a hematoma to the back of the forehead without laceration. She is reproducible anterior chest wall pain. EKG was interpreted by me and is in the PAs note.  It is nonischemic. Basic lab work is notable for hypokalemia. Plain films and head CT are negative.  Patient w/u otherwise reassuring.  Shon Baton, MD 12/14/12 [phone]

## 2012-12-19 ENCOUNTER — Other Ambulatory Visit: Payer: Medicare Other

## 2012-12-21 ENCOUNTER — Other Ambulatory Visit (HOSPITAL_COMMUNITY): Payer: Self-pay | Admitting: Neurology

## 2012-12-21 ENCOUNTER — Ambulatory Visit (HOSPITAL_COMMUNITY): Payer: Medicare Other | Attending: Neurology | Admitting: Radiology

## 2012-12-21 DIAGNOSIS — I079 Rheumatic tricuspid valve disease, unspecified: Secondary | ICD-10-CM | POA: Insufficient documentation

## 2012-12-21 DIAGNOSIS — I495 Sick sinus syndrome: Secondary | ICD-10-CM | POA: Insufficient documentation

## 2012-12-21 DIAGNOSIS — I4891 Unspecified atrial fibrillation: Secondary | ICD-10-CM | POA: Insufficient documentation

## 2012-12-21 DIAGNOSIS — I6789 Other cerebrovascular disease: Secondary | ICD-10-CM

## 2012-12-21 DIAGNOSIS — G8114 Spastic hemiplegia affecting left nondominant side: Secondary | ICD-10-CM

## 2012-12-21 DIAGNOSIS — I10 Essential (primary) hypertension: Secondary | ICD-10-CM | POA: Insufficient documentation

## 2012-12-21 DIAGNOSIS — Z950 Presence of cardiac pacemaker: Secondary | ICD-10-CM | POA: Insufficient documentation

## 2012-12-21 DIAGNOSIS — I639 Cerebral infarction, unspecified: Secondary | ICD-10-CM

## 2012-12-21 DIAGNOSIS — E785 Hyperlipidemia, unspecified: Secondary | ICD-10-CM | POA: Insufficient documentation

## 2012-12-21 DIAGNOSIS — I6350 Cerebral infarction due to unspecified occlusion or stenosis of unspecified cerebral artery: Secondary | ICD-10-CM | POA: Insufficient documentation

## 2012-12-21 NOTE — Progress Notes (Signed)
Echocardiogram performed.  

## 2012-12-24 NOTE — Progress Notes (Signed)
Quick Note:  Please call patient for normal ECHO, ______

## 2012-12-24 NOTE — Progress Notes (Signed)
Quick Note:  I called pt and relayed the results of echocardiogram (normal) . Pt verbalized understanding. ______

## 2012-12-25 ENCOUNTER — Telehealth: Payer: Self-pay | Admitting: Neurology

## 2012-12-25 ENCOUNTER — Other Ambulatory Visit: Payer: Medicare Other

## 2012-12-25 NOTE — Telephone Encounter (Signed)
Patient was resched

## 2012-12-26 ENCOUNTER — Other Ambulatory Visit: Payer: Medicare Other

## 2012-12-31 ENCOUNTER — Emergency Department (HOSPITAL_COMMUNITY): Payer: Medicare Other

## 2012-12-31 ENCOUNTER — Inpatient Hospital Stay (HOSPITAL_COMMUNITY)
Admission: EM | Admit: 2012-12-31 | Discharge: 2013-01-04 | DRG: 689 | Disposition: A | Discharge status: Skilled Nursing Facility | Payer: Medicare Other | Attending: Family Medicine | Admitting: Family Medicine

## 2012-12-31 ENCOUNTER — Encounter (HOSPITAL_COMMUNITY): Payer: Self-pay | Admitting: Emergency Medicine

## 2012-12-31 DIAGNOSIS — I4891 Unspecified atrial fibrillation: Secondary | ICD-10-CM | POA: Diagnosis present

## 2012-12-31 DIAGNOSIS — Z681 Body mass index (BMI) 19 or less, adult: Secondary | ICD-10-CM

## 2012-12-31 DIAGNOSIS — F4320 Adjustment disorder, unspecified: Secondary | ICD-10-CM

## 2012-12-31 DIAGNOSIS — F3289 Other specified depressive episodes: Secondary | ICD-10-CM | POA: Diagnosis present

## 2012-12-31 DIAGNOSIS — E039 Hypothyroidism, unspecified: Secondary | ICD-10-CM | POA: Diagnosis present

## 2012-12-31 DIAGNOSIS — F4323 Adjustment disorder with mixed anxiety and depressed mood: Secondary | ICD-10-CM | POA: Diagnosis present

## 2012-12-31 DIAGNOSIS — Z72820 Sleep deprivation: Secondary | ICD-10-CM

## 2012-12-31 DIAGNOSIS — E41 Nutritional marasmus: Secondary | ICD-10-CM | POA: Diagnosis present

## 2012-12-31 DIAGNOSIS — F329 Major depressive disorder, single episode, unspecified: Secondary | ICD-10-CM | POA: Diagnosis present

## 2012-12-31 DIAGNOSIS — Z8673 Personal history of transient ischemic attack (TIA), and cerebral infarction without residual deficits: Secondary | ICD-10-CM

## 2012-12-31 DIAGNOSIS — N390 Urinary tract infection, site not specified: Principal | ICD-10-CM

## 2012-12-31 DIAGNOSIS — I495 Sick sinus syndrome: Secondary | ICD-10-CM | POA: Diagnosis present

## 2012-12-31 DIAGNOSIS — S0003XA Contusion of scalp, initial encounter: Secondary | ICD-10-CM | POA: Diagnosis present

## 2012-12-31 DIAGNOSIS — Z79899 Other long term (current) drug therapy: Secondary | ICD-10-CM

## 2012-12-31 DIAGNOSIS — Z7982 Long term (current) use of aspirin: Secondary | ICD-10-CM

## 2012-12-31 DIAGNOSIS — Z9181 History of falling: Secondary | ICD-10-CM

## 2012-12-31 DIAGNOSIS — Y92009 Unspecified place in unspecified non-institutional (private) residence as the place of occurrence of the external cause: Secondary | ICD-10-CM

## 2012-12-31 DIAGNOSIS — E876 Hypokalemia: Secondary | ICD-10-CM | POA: Diagnosis not present

## 2012-12-31 DIAGNOSIS — Z950 Presence of cardiac pacemaker: Secondary | ICD-10-CM

## 2012-12-31 DIAGNOSIS — IMO0002 Reserved for concepts with insufficient information to code with codable children: Secondary | ICD-10-CM | POA: Diagnosis present

## 2012-12-31 DIAGNOSIS — I10 Essential (primary) hypertension: Secondary | ICD-10-CM | POA: Diagnosis present

## 2012-12-31 DIAGNOSIS — F411 Generalized anxiety disorder: Secondary | ICD-10-CM | POA: Diagnosis present

## 2012-12-31 DIAGNOSIS — J45909 Unspecified asthma, uncomplicated: Secondary | ICD-10-CM | POA: Diagnosis present

## 2012-12-31 DIAGNOSIS — E785 Hyperlipidemia, unspecified: Secondary | ICD-10-CM | POA: Diagnosis present

## 2012-12-31 HISTORY — DX: Cerebral infarction, unspecified: I63.9

## 2012-12-31 LAB — CREATININE, SERUM
GFR calc Af Amer: 45 mL/min — ABNORMAL LOW (ref >90)
GFR calc non Af Amer: 38 mL/min — ABNORMAL LOW (ref >90)

## 2012-12-31 LAB — COMPREHENSIVE METABOLIC PANEL
AST: 17 U/L (ref 0–37)
Albumin: 3.6 g/dL (ref 3.5–5.2)
Alkaline Phosphatase: 101 U/L (ref 39–117)
BUN: 25 mg/dL — ABNORMAL HIGH (ref 6–23)
CO2: 26 mEq/L (ref 19–32)
Chloride: 102 mEq/L (ref 96–112)
Creatinine, Ser: 0.95 mg/dL (ref 0.50–1.10)
Potassium: 3.5 mEq/L (ref 3.5–5.1)
Sodium: 139 mEq/L (ref 135–145)
Total Bilirubin: 0.5 mg/dL (ref 0.3–1.2)
Total Protein: 6.8 g/dL (ref 6.0–8.3)

## 2012-12-31 LAB — URINALYSIS, ROUTINE W REFLEX MICROSCOPIC
Glucose, UA: NEGATIVE mg/dL
Hgb urine dipstick: NEGATIVE
Ketones, ur: NEGATIVE mg/dL
Protein, ur: 30 mg/dL — AB

## 2012-12-31 LAB — CBC
MCHC: 32.6 g/dL (ref 30.0–36.0)
MCV: 88.9 fL (ref 78.0–100.0)
Platelets: 349 10*3/uL (ref 150–400)
RDW: 14.2 % (ref 11.5–15.5)
WBC: 9.5 10*3/uL (ref 4.0–10.5)

## 2012-12-31 LAB — CBC WITH DIFFERENTIAL/PLATELET
Basophils Absolute: 0 10*3/uL (ref 0.0–0.1)
Basophils Relative: 0 % (ref 0–1)
Eosinophils Absolute: 0.6 10*3/uL (ref 0.0–0.7)
HCT: 39.1 % (ref 36.0–46.0)
Hemoglobin: 12.9 g/dL (ref 12.0–15.0)
Lymphocytes Relative: 8 % — ABNORMAL LOW (ref 12–46)
MCHC: 33 g/dL (ref 30.0–36.0)
Monocytes Absolute: 0.8 10*3/uL (ref 0.1–1.0)
Monocytes Relative: 9 % (ref 3–12)
Neutro Abs: 6.5 10*3/uL (ref 1.7–7.7)
Neutrophils Relative %: 76 % (ref 43–77)
RBC: 4.43 MIL/uL (ref 3.87–5.11)
RDW: 14.2 % (ref 11.5–15.5)
WBC: 8.5 10*3/uL (ref 4.0–10.5)

## 2012-12-31 LAB — URINE MICROSCOPIC-ADD ON

## 2012-12-31 MED ORDER — LOSARTAN POTASSIUM 50 MG PO TABS
100.0000 mg | ORAL_TABLET | Freq: Every day | ORAL | Status: DC
  Administered 2012-12-31 – 2013-01-04 (×5): 100 mg via ORAL
  Filled 2012-12-31 (×5): qty 2

## 2012-12-31 MED ORDER — SODIUM CHLORIDE 0.9 % IV SOLN
Freq: Once | INTRAVENOUS | Status: AC
  Administered 2012-12-31: 13:00:00 via INTRAVENOUS

## 2012-12-31 MED ORDER — DEXTROSE 5 % IV SOLN
1.0000 g | Freq: Once | INTRAVENOUS | Status: AC
  Administered 2012-12-31: 1 g via INTRAVENOUS
  Filled 2012-12-31: qty 10

## 2012-12-31 MED ORDER — SODIUM CHLORIDE 0.9 % IV SOLN
INTRAVENOUS | Status: DC
  Administered 2013-01-01: 02:00:00 via INTRAVENOUS

## 2012-12-31 MED ORDER — ONDANSETRON HCL 4 MG PO TABS: 4.0000 mg | ORAL_TABLET | Freq: Four times a day (QID) | ORAL | Status: DC | PRN

## 2012-12-31 MED ORDER — MONTELUKAST SODIUM 10 MG PO TABS
10.0000 mg | ORAL_TABLET | Freq: Every day | ORAL | Status: DC
  Administered 2012-12-31 – 2013-01-04 (×5): 10 mg via ORAL
  Filled 2012-12-31 (×6): qty 1

## 2012-12-31 MED ORDER — ACETAMINOPHEN 325 MG PO TABS: 650.0000 mg | ORAL_TABLET | Freq: Four times a day (QID) | ORAL | Status: DC | PRN

## 2012-12-31 MED ORDER — AMLODIPINE BESYLATE 10 MG PO TABS
10.0000 mg | ORAL_TABLET | Freq: Every day | ORAL | Status: DC
  Administered 2012-12-31 – 2013-01-04 (×5): 10 mg via ORAL
  Filled 2012-12-31 (×6): qty 1

## 2012-12-31 MED ORDER — ENOXAPARIN SODIUM 40 MG/0.4ML ~~LOC~~ SOLN
40.0000 mg | SUBCUTANEOUS | Status: DC
  Administered 2012-12-31 – 2013-01-03 (×4): 40 mg via SUBCUTANEOUS
  Filled 2012-12-31 (×5): qty 0.4

## 2012-12-31 MED ORDER — SIMVASTATIN 40 MG PO TABS: 40.0000 mg | ORAL_TABLET | Freq: Every day | ORAL | Status: DC

## 2012-12-31 MED ORDER — ACETAMINOPHEN 650 MG RE SUPP: 650.0000 mg | Freq: Four times a day (QID) | RECTAL | Status: DC | PRN

## 2012-12-31 MED ORDER — VITAMIN D3 25 MCG (1000 UNIT) PO TABS
1000.0000 [IU] | ORAL_TABLET | Freq: Every day | ORAL | Status: DC
  Administered 2013-01-01 – 2013-01-04 (×4): 1000 [IU] via ORAL
  Filled 2012-12-31 (×4): qty 1

## 2012-12-31 MED ORDER — ASPIRIN EC 81 MG PO TBEC
81.0000 mg | DELAYED_RELEASE_TABLET | Freq: Every day | ORAL | Status: DC
  Administered 2012-12-31 – 2013-01-04 (×5): 81 mg via ORAL
  Filled 2012-12-31 (×5): qty 1

## 2012-12-31 MED ORDER — FLUTICASONE PROPIONATE HFA 110 MCG/ACT IN AERO
1.0000 | INHALATION_SPRAY | Freq: Every day | RESPIRATORY_TRACT | Status: DC
  Administered 2012-12-31 – 2013-01-04 (×5): 1 via RESPIRATORY_TRACT
  Filled 2012-12-31: qty 12

## 2012-12-31 MED ORDER — DEXTROSE 5 % IV SOLN
1.0000 g | INTRAVENOUS | Status: DC
  Administered 2013-01-01 – 2013-01-02 (×2): 1 g via INTRAVENOUS
  Filled 2012-12-31 (×2): qty 10

## 2012-12-31 MED ORDER — ATORVASTATIN CALCIUM 20 MG PO TABS
20.0000 mg | ORAL_TABLET | Freq: Every day | ORAL | Status: DC
  Administered 2012-12-31 – 2013-01-03 (×4): 20 mg via ORAL
  Filled 2012-12-31 (×5): qty 1

## 2012-12-31 MED ORDER — SODIUM CHLORIDE 0.9 % IJ SOLN
3.0000 mL | Freq: Two times a day (BID) | INTRAMUSCULAR | Status: DC
  Administered 2013-01-03: 3 mL via INTRAVENOUS

## 2012-12-31 MED ORDER — ONDANSETRON HCL 4 MG/2ML IJ SOLN: 4.0000 mg | Freq: Four times a day (QID) | INTRAMUSCULAR | Status: DC | PRN

## 2012-12-31 MED ORDER — TUBERCULIN PPD 5 UNIT/0.1ML ID SOLN
5.0000 [IU] | Freq: Once | INTRADERMAL | Status: DC
  Filled 2012-12-31: qty 0.1

## 2012-12-31 MED ORDER — SODIUM CHLORIDE 0.9 % IV SOLN
INTRAVENOUS | Status: AC
  Administered 2012-12-31: 17:00:00 via INTRAVENOUS

## 2012-12-31 MED ORDER — SODIUM CHLORIDE 0.9 % IJ SOLN: 3.0000 mL | INTRAMUSCULAR | Status: DC | PRN

## 2012-12-31 MED ORDER — ALBUTEROL SULFATE HFA 108 (90 BASE) MCG/ACT IN AERS
2.0000 | INHALATION_SPRAY | Freq: Four times a day (QID) | RESPIRATORY_TRACT | Status: DC | PRN
  Filled 2012-12-31: qty 6.7

## 2012-12-31 MED ORDER — SODIUM CHLORIDE 0.9 % IV SOLN: 250.0000 mL | INTRAVENOUS | Status: DC | PRN

## 2012-12-31 NOTE — Progress Notes (Signed)
UR done. 

## 2012-12-31 NOTE — ED Notes (Signed)
Pt is calm and cooperative. Pt provided water.
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
## 2012-12-31 NOTE — ED Notes (Signed)
Pt ambulated to restroom with walker and RN and back to bed 16. Stretcher locked and in lowest position. Side rails up x 2.

## 2012-12-31 NOTE — ED Notes (Signed)
[place], MD at bedside. 
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
## 2013-01-01 DIAGNOSIS — E039 Hypothyroidism, unspecified: Secondary | ICD-10-CM

## 2013-01-01 DIAGNOSIS — F4323 Adjustment disorder with mixed anxiety and depressed mood: Secondary | ICD-10-CM

## 2013-01-01 LAB — COMPREHENSIVE METABOLIC PANEL
ALT: 11 U/L (ref 0–35)
Albumin: 3 g/dL — ABNORMAL LOW (ref 3.5–5.2)
CO2: 25 mEq/L (ref 19–32)
Calcium: 9.2 mg/dL (ref 8.4–10.5)
Creatinine, Ser: 0.79 mg/dL (ref 0.50–1.10)
GFR calc Af Amer: 88 mL/min — ABNORMAL LOW (ref >90)
GFR calc non Af Amer: 76 mL/min — ABNORMAL LOW (ref >90)
Glucose, Bld: 92 mg/dL (ref 70–99)
Sodium: 139 mEq/L (ref 135–145)
Total Protein: 6 g/dL (ref 6.0–8.3)

## 2013-01-01 LAB — URINE CULTURE: Culture: NO GROWTH

## 2013-01-01 LAB — CBC
Hemoglobin: 11.5 g/dL — ABNORMAL LOW (ref 12.0–15.0)
MCH: 29.2 pg (ref 26.0–34.0)
MCHC: 32.6 g/dL (ref 30.0–36.0)
MCV: 89.6 fL (ref 78.0–100.0)
Platelets: 284 10*3/uL (ref 150–400)

## 2013-01-01 MED ORDER — ENSURE COMPLETE PO LIQD
237.0000 mL | Freq: Two times a day (BID) | ORAL | Status: DC
  Administered 2013-01-01 – 2013-01-04 (×6): 237 mL via ORAL

## 2013-01-01 MED ORDER — POTASSIUM CHLORIDE CRYS ER 20 MEQ PO TBCR
40.0000 meq | EXTENDED_RELEASE_TABLET | Freq: Once | ORAL | Status: DC
  Filled 2013-01-01: qty 2
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
## 2013-01-02 DIAGNOSIS — F4320 Adjustment disorder, unspecified: Secondary | ICD-10-CM

## 2013-01-02 LAB — BASIC METABOLIC PANEL
BUN: 16 mg/dL (ref 6–23)
CO2: 27 mEq/L (ref 19–32)
Chloride: 105 mEq/L (ref 96–112)
Creatinine, Ser: 0.58 mg/dL (ref 0.50–1.10)
GFR calc Af Amer: >90 mL/min (ref >90)
GFR calc non Af Amer: 84 mL/min — ABNORMAL LOW (ref >90)
Potassium: 3.1 mEq/L — ABNORMAL LOW (ref 3.5–5.1)

## 2013-01-02 MED ORDER — CIPROFLOXACIN HCL 500 MG PO TABS
500.0000 mg | ORAL_TABLET | Freq: Two times a day (BID) | ORAL | Status: DC
  Administered 2013-01-02 – 2013-01-04 (×4): 500 mg via ORAL
  Filled 2013-01-02 (×6): qty 1

## 2013-01-02 MED ORDER — HALOPERIDOL LACTATE 5 MG/ML IJ SOLN
2.0000 mg | Freq: Once | INTRAMUSCULAR | Status: AC
  Administered 2013-01-02: 2 mg via INTRAVENOUS

## 2013-01-02 MED ORDER — HALOPERIDOL LACTATE 5 MG/ML IJ SOLN
2.0000 mg | Freq: Once | INTRAMUSCULAR | Status: DC
  Administered 2013-01-02: 07:00:00 2 mg via INTRAVENOUS
  Filled 2013-01-02: qty 0.4

## 2013-01-02 NOTE — Progress Notes (Signed)
Discussed with MD. Capacity may need to be readdressed due to patient's behavior. CSW can not currently call patient's daughter due to the fact that patient does not want CSW to and patient was found to have capacity yesterday.  Takia Runyon C. Kiki Bivens MSW, LCSW [phone]
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
## 2013-01-02 NOTE — Progress Notes (Addendum)
Clinical Social Work  Per psych MD, patient has capacity to make decisions. CSW attempted to meet with patient in order to complete psychosocial assessment but patient sleeping at this time. No visitors present. CSW will continue to follow and will complete assessment at later time.  [employer], [place] [phone]
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
## 2013-01-02 NOTE — Progress Notes (Signed)
Patient and sitter in room. Patient asked me to remove her restraints. I told her I could not do this. She answered questions about her life appropriately, but sometimes wanders in her thought. She is Catholic and has a strong faith that is important to her. She loves her home which she considers to be [place] not here. She has a home and family there including six brothers and a sister. Her daughter brought her here to [facility] but she does not want to go back to her daughter's and wants to return to [place] Her husband died years ago, but she is still grieving his loss and talked about how much changed as a result of his death. We had a pleasant visit. Support; presence; listening; prayer. Will follow up.

## 2013-01-03 ENCOUNTER — Ambulatory Visit: Payer: Medicare Other | Admitting: Neurology

## 2013-01-03 LAB — CBC WITH DIFFERENTIAL/PLATELET
Basophils Absolute: 0.1 10*3/uL (ref 0.0–0.1)
Basophils Relative: 1 % (ref 0–1)
Eosinophils Absolute: 2.6 10*3/uL — ABNORMAL HIGH (ref 0.0–0.7)
MCH: 29.7 pg (ref 26.0–34.0)
MCHC: 33.3 g/dL (ref 30.0–36.0)
Monocytes Absolute: 0.6 10*3/uL (ref 0.1–1.0)
Neutrophils Relative %: 40 % — ABNORMAL LOW (ref 43–77)
Platelets: 335 10*3/uL (ref 150–400)
WBC: 8 10*3/uL (ref 4.0–10.5)

## 2013-01-03 LAB — COMPREHENSIVE METABOLIC PANEL
ALT: 12 U/L (ref 0–35)
Albumin: 2.9 g/dL — ABNORMAL LOW (ref 3.5–5.2)
Alkaline Phosphatase: 82 U/L (ref 39–117)
Calcium: 9.8 mg/dL (ref 8.4–10.5)
Creatinine, Ser: 0.72 mg/dL (ref 0.50–1.10)
GFR calc Af Amer: >90 mL/min (ref >90)
Glucose, Bld: 98 mg/dL (ref 70–99)
Potassium: 4 mEq/L (ref 3.5–5.1)
Sodium: 139 mEq/L (ref 135–145)
Total Protein: 6.1 g/dL (ref 6.0–8.3)

## 2013-01-03 MED ORDER — POTASSIUM CHLORIDE CRYS ER 20 MEQ PO TBCR
40.0000 meq | EXTENDED_RELEASE_TABLET | Freq: Once | ORAL | Status: DC
  Filled 2013-01-03: qty 2

## 2013-01-03 MED ORDER — POTASSIUM CHLORIDE 20 MEQ PO PACK: 40.0000 meq | PACK | Freq: Once | ORAL | Status: DC

## 2013-01-03 NOTE — Progress Notes (Addendum)
Clinical Social Work Department CLINICAL SOCIAL WORK PLACEMENT NOTE 01/03/2013  Patient:  Yvonne Buck, Yvonne Buck  Account Number:  [id number] Admit date:  12/31/2012  Clinical Social Worker:  Unk Lightning, LCSW  Date/time:  01/03/2013 10:00 AM  Clinical Social Work is seeking post-discharge placement for this patient at the following level of care:   SKILLED NURSING   (*CSW will update this form in Epic as items are completed)   01/03/2013  Patient/family provided with [facility] Department of Clinical Social Work's list of facilities offering this level of care within the geographic area requested by the patient (or if unable, by the patient's family).  01/03/2013  Patient/family informed of their freedom to choose among providers that offer the needed level of care, that participate in Medicare, Medicaid or managed care program needed by the patient, have an available bed and are willing to accept the patient.  01/03/2013  Patient/family informed of [facility]' ownership interest in [facility], as well as of the fact that they are under no obligation to receive care at this facility.  PASARR submitted to EDS on 01/03/2013 PASARR number received from EDS on 01/03/2013  FL2 transmitted to all facilities in geographic area requested by pt/family on  01/03/2013 FL2 transmitted to all facilities within larger geographic area on   Patient informed that his/her managed care company has contracts with or will negotiate with  certain facilities, including the following:     Patient/family informed of bed offers received:  01/03/13 Patient chooses bed at [facility] Physician recommends and patient chooses bed at    Patient to be transferred to [facility]  on  01/04/13 Patient to be transferred to facility by [facility]  The following physician request were entered in Epic:   Additional Comments:

## 2013-01-03 NOTE — Progress Notes (Signed)
Discussed case with pt's daughter, [name], and updated her on her mother's condition. This AM, patient stated it is OK to speak with her daughter. Daughter expressed concerns that it is becoming more difficult to care for her mother and therefore, is requesting SNF. Will defer discharge planning to SW. Will follow.
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
## 2013-01-04 MED ORDER — CIPROFLOXACIN HCL 500 MG PO TABS: 500.0000 mg | ORAL_TABLET | Freq: Two times a day (BID) | ORAL | Status: DC

## 2013-01-04 NOTE — Progress Notes (Signed)
Report called to Cheri Guppy, RN, at [facility]. All questions answered.
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
## 2013-01-08 NOTE — Clinical Documentation Improvement (Signed)
Malnutrition Coding Query   Please update your documentation within the medical record to reflect your response to this query.  01/08/13  Dear Dr. Rhona Leavens Marton Redwood,  In a better effort to capture your patient's severity of illness, reflect appropriate length of stay and utilization of resources, a review of the patient medical record has revealed the following indicators.   Based on your clinical judgment, please clarify and document in a progress note and/or discharge summary the clinical condition associated with the following supporting information: BMI 16.83  In responding to this query please exercise your independent judgment. The fact that a query is asked, does not imply that any particular answer is desired or expected.  Possible Clinical Conditions?  _______Mild Malnutrition  _______Moderate Malnutrition ____x___Severe Malnutrition  _______Protein Calorie Malnutrition _______Severe Protein Calorie Malnutrition _______Emaciation  _______Cachexia  _______Other Condition________________ _______Cannot clinically determine    Supporting Information:  Signs & Symptoms: Ht 5'6 Wt 104 lbs  BMI: 16.83   Diagnostics:  Albumin level: 2.9   Treatment: Ensure Complete

## 2013-01-10 ENCOUNTER — Encounter: Payer: Medicare Other | Admitting: Internal Medicine

## 2013-01-14 ENCOUNTER — Non-Acute Institutional Stay (SKILLED_NURSING_FACILITY): Payer: Medicare Other | Admitting: Internal Medicine

## 2013-01-14 ENCOUNTER — Encounter: Payer: Self-pay | Admitting: Internal Medicine

## 2013-01-14 DIAGNOSIS — N390 Urinary tract infection, site not specified: Secondary | ICD-10-CM

## 2013-01-14 DIAGNOSIS — J45909 Unspecified asthma, uncomplicated: Secondary | ICD-10-CM

## 2013-01-14 DIAGNOSIS — I10 Essential (primary) hypertension: Secondary | ICD-10-CM

## 2013-01-14 DIAGNOSIS — I495 Sick sinus syndrome: Secondary | ICD-10-CM

## 2013-01-14 DIAGNOSIS — R269 Unspecified abnormalities of gait and mobility: Secondary | ICD-10-CM

## 2013-01-14 DIAGNOSIS — R2681 Unsteadiness on feet: Secondary | ICD-10-CM

## 2013-01-14 NOTE — Progress Notes (Signed)
Patient ID: Yvonne Buck, female   DOB: Jul 03, 1929, 79 y.o.   MRN: [id number]  [facility]   PCP: Lonia Blood, MD  Code Status: full code  Allergies  Allergen Reactions  . Penicillins Other (See Comments)    Red spots ### Tolerated Rocephin 12/2012 ###    Chief Complaint: new admit  HPI:  79 y/o female patient is here for rehabilitation after hospital admission from 12/31/12- 01/04/13 with UTI. She has history of afib, hypertension and hypothyroidism. She was seen in her room. She has been working with therapy team. She denies any complaints. She has been worried about returning to her daughter's home as she feels she is not getting appropriate care there. Se is alert and oriented. No concerns from staff  Review of Systems  Constitutional: Negative for fever, chills, weight loss, malaise/fatigue and diaphoresis.  HENT: Negative for congestion, hearing loss and sore throat.   Eyes: Negative for blurred vision, double vision and discharge.  Respiratory: Negative for cough, sputum production, shortness of breath and wheezing.   Cardiovascular: Negative for chest pain, palpitations, orthopnea and leg swelling.  Gastrointestinal: Negative for heartburn, nausea, vomiting, abdominal pain, diarrhea and constipation.  Genitourinary: Negative for dysuria, urgency, frequency and flank pain.  Musculoskeletal: Negative for back pain, falls, joint pain and myalgias.  Skin: Negative for itching and rash.  Neurological: Positive for weakness. Negative for dizziness, tingling, focal weakness and headaches.  Psychiatric/Behavioral: Negative for depression and memory loss. The patient is some what nervous/anxious.     Past Medical History  Diagnosis Date  . Tachycardia-bradycardia syndrome     s/p [employer] PPM implant in [place]  . Paroxysmal atrial fibrillation   . Hypertension   . Frequent falls   . Asthma   . Hyperlipidemia   . Depression   . Anxiety   . Pacemaker   .  Stroke    Past Surgical History  Procedure Laterality Date  . Pacemaker insertion  01/04/2005    [employer] [place] PPM 1290 [phone]), GDT [id number] atrial lead and 4457 V lead all implanted in [place]  . Cholecystectomy    . Gallbladder surgery    . Tonsillectomy and adenoidectomy     Social History:   reports that she has never smoked. She has never used smokeless tobacco. She reports that she does not drink alcohol or use illicit drugs.  Family History  Problem Relation Age of Onset  . Diabetes Mother   . High blood pressure Mother   . Heart Problems Father     Medications: Patient's Medications  New Prescriptions   No medications on file  Previous Medications   ALBUTEROL (PROVENTIL HFA;VENTOLIN HFA) 108 (90 BASE) MCG/ACT INHALER    Inhale 2 puffs into the lungs every 6 (six) hours as needed for wheezing.   AMLODIPINE (NORVASC) 10 MG TABLET    Take 10 mg by mouth at bedtime.   ASPIRIN EC 81 MG TABLET    Take 81 mg by mouth daily.   CHOLECALCIFEROL (VITAMIN D) 1000 UNITS TABLET    Take 1,000 Units by mouth daily.   CIPROFLOXACIN (CIPRO) 500 MG TABLET    Take 1 tablet (500 mg total) by mouth 2 (two) times daily.   FLUTICASONE (FLOVENT HFA) 110 MCG/ACT INHALER    Inhale 1 puff into the lungs daily.    LOSARTAN (COZAAR) 100 MG TABLET    Take 100 mg by mouth daily.   MONTELUKAST (SINGULAIR) 10 MG TABLET    Take 10  mg by mouth at bedtime.   NITROGLYCERIN (NITROSTAT) 0.4 MG SL TABLET    Place 0.4 mg under the tongue every 5 (five) minutes as needed for chest pain.   SIMVASTATIN (ZOCOR) 40 MG TABLET    Take 40 mg by mouth daily.   TETRAHYDROZOLINE HCL (VISINE OP)    Apply 1 drop to eye 2 (two) times daily as needed (for dry eyes).   THEOPHYLLINE (UNIPHYL) 400 MG 24 HR TABLET    Take 400 mg by mouth daily.  Modified Medications   No medications on file  Discontinued Medications   No medications on file     Physical Exam:  Filed Vitals:   01/14/13 1706  BP: 148/69  Pulse:  82  Temp: 97.7 F (36.5 C)  Resp: 18  SpO2: 96%   General- elderly frail female in no acute distress Head- atraumatic, normocephalic Eyes- PERRLA, EOMI, no pallor, no icterus, no discharge Neck- no lymphadenopathy, no thyromegaly, no jugular vein distension, no carotid bruit Cardiovascular- normal s1,s2, no murmurs/ rubs/ gallops Respiratory- bilateral clear to auscultation, no wheeze, no rhonchi, no crackles Abdomen- bowel sounds present, soft, non tender Musculoskeletal- able to move all 4 extremities, no spinal and paraspinal tenderness, unsteady gait, using walker Neurological- no focal deficit Psychiatry- alert and oriented to person, place and time   Labs reviewed: Basic Metabolic Panel:  Recent Labs  98/11/91 0515 01/02/13 0831 01/03/13 0530  NA 139 140 139  K 3.4* 3.1* 4.0  CL 107 105 105  CO2 25 27 29   GLUCOSE 92 166* 98  BUN 19 16 22   CREATININE 0.79 0.58 0.72  CALCIUM 9.2 9.5 9.8   Liver Function Tests:  Recent Labs  12/31/12 1125 01/01/13 0515 01/03/13 0530  AST 17 14 15   ALT 15 11 12   ALKPHOS 101 85 82  BILITOT 0.5 0.5 0.3  PROT 6.8 6.0 6.1  ALBUMIN 3.6 3.0* 2.9*   No results found for this basename: LIPASE, AMYLASE,  in the last 8760 hours No results found for this basename: AMMONIA,  in the last 8760 hours CBC:  Recent Labs  12/14/12 0857  12/31/12 1125 12/31/12 1717 01/01/13 0515 01/03/13 0530  WBC 7.5  --  8.5 9.5 8.1 8.0  NEUTROABS 3.3  --  6.5  --   --  3.2  HGB 14.2  < > 12.9 12.5 11.5* 12.1  HCT 42.5  < > 39.1 38.3 35.3* 36.3  MCV 88.0  --  88.3 88.9 89.6 89.2  PLT 307  --  361 349 284 335  < > = values in this interval not displayed.  Radiological Exams: Dg Chest 2 View  12/14/2012   CLINICAL DATA:  Pain post trauma  EXAM: CHEST  2 VIEW  COMPARISON:  December 20, 2010  FINDINGS: The lungs are clear. Heart size and pulmonary vascularity are normal. No adenopathy. Pacemaker leads are attached to the right atrium and right  ventricle. There is evidence of an old healed fracture of the left posterior 9th rib. There is mild anterior wedging of an upper thoracic vertebral body which is stable. No pneumothorax.  IMPRESSION: No edema or consolidation.   Electronically Signed   By: Bretta Bang M.D.   On: 12/14/2012 09:30   Ct Head Wo Contrast  12/31/2012   CLINICAL DATA:  Altered mental status. Medical clearance.  EXAM: CT HEAD WITHOUT CONTRAST  TECHNIQUE: Contiguous axial images were obtained from the base of the skull through the vertex without intravenous contrast.  COMPARISON:  12/14/2012  FINDINGS: Cerebral volume normal for age. Mild atrophy noted. Negative for hydrocephalus.  Chronic microvascular ischemic changes in the white matter are stable. No acute infarct, hemorrhage, or mass lesion.  IMPRESSION: Chronic microvascular ischemia. No acute abnormality.   Electronically Signed   By: Marlan Palau M.D.   On: 12/31/2012 11:46   Ct Cervical Spine Wo Contrast  12/14/2012   CLINICAL DATA:  Pain post trauma  EXAM: CT HEAD WITHOUT CONTRAST  CT CERVICAL SPINE WITHOUT CONTRAST  TECHNIQUE: Multidetector CT imaging of the head and cervical spine was performed following the standard protocol without intravenous contrast. Multiplanar CT image reconstructions of the cervical spine were also generated.  COMPARISON:  brain CT November 23, 2012; cervical spine CT December 20, 2010  FINDINGS: CT HEAD FINDINGS  There is mild diffuse atrophy. There is no intracranial mass, hemorrhage, extra-axial fluid collection, or midline shift.  There is patchy small vessel disease throughout the centra semiovale bilaterally. There is no new gray-white compartment lesion. No acute infarct apparent.  There is a right parietal scalp hematoma. Bony calvarium appears intact. The mastoid air cells are clear.  CT CERVICAL SPINE FINDINGS  There is no fracture or spondylolisthesis. Prevertebral soft tissues and predental space regions are normal.  There is the  moderately severe disc space narrowing at C6-7. There is milder disc space narrowing at C3-4, C4-5, and C5-6. There is facet hypertrophy at multiple levels, most marked at C6-7 on the left. No disc extrusion or stenosis. There is carotid artery calcification bilaterally.  IMPRESSION: CT head: Mild atrophy with patchy small vessel disease throughout the centra semiovale bilaterally, stable. No intracranial mass or hemorrhage. There is a right parietal scalp hematoma.  CT cervical spine: No fracture or spondylolisthesis. Multilevel osteoarthritic change. Carotid artery calcification bilaterally.   Electronically Signed   By: Bretta Bang M.D.   On: 12/14/2012 09:29    Assessment/Plan Gait instability- ere for gait training exercise, fall precautions, to work with PT and OT  Hypertension- controlled bp readings, continue amlodipine 10 mg daily, losartan 100 mg daily  Tachy brady syndrome- rate controlled, continue baby aspirin  uti- currently asymptomatic, completed course of ciprofloxacin, monitor clinically  Asthma- continue flovent, theophylline and singulair, currently stable. Also to continue proventil   Goals of care: return home to her son, will have social worker look into this   Labs/tests ordered- none

## 2013-01-25 ENCOUNTER — Encounter: Payer: Self-pay | Admitting: Cardiology

## 2013-01-25 ENCOUNTER — Ambulatory Visit (INDEPENDENT_AMBULATORY_CARE_PROVIDER_SITE_OTHER): Payer: Medicare Other | Admitting: Cardiology

## 2013-01-25 VITALS — BP 148/80 | HR 84 | Ht 66.0 in | Wt 113.0 lb

## 2013-01-25 DIAGNOSIS — Z950 Presence of cardiac pacemaker: Secondary | ICD-10-CM

## 2013-01-25 DIAGNOSIS — I4891 Unspecified atrial fibrillation: Secondary | ICD-10-CM

## 2013-01-25 DIAGNOSIS — I495 Sick sinus syndrome: Secondary | ICD-10-CM

## 2013-01-25 LAB — MDC_IDC_ENUM_SESS_TYPE_INCLINIC
Brady Statistic RV Percent Paced: 1 %
Date Time Interrogation Session: 20141205050000
Implantable Pulse Generator Model: 1290
Lead Channel Impedance Value: 380 Ohm
Lead Channel Impedance Value: 470 Ohm
Lead Channel Pacing Threshold Amplitude: 0.4 V
Lead Channel Pacing Threshold Pulse Width: 0.5 ms
Lead Channel Sensing Intrinsic Amplitude: 2.7 mV
Lead Channel Setting Pacing Amplitude: 3 V
Lead Channel Setting Pacing Amplitude: 3 V

## 2013-01-25 NOTE — Progress Notes (Signed)
ELECTROPHYSIOLOGY OFFICE NOTE  Patient ID: Yvonne Buck MRN: [id number], DOB/AGE: [birth year]   Date of Visit: 01/25/2013  Primary Physician: Lonia Blood, MD Primary Cardiologist: Hillis Range, MD Reason for Visit: EP/device follow-up  History of Present Illness  Yvonne Buck is a 79 y.o. female with tachy-brady syndrome s/p PPM implant, PAF, prior CVA, HTN and frequent falls who presents today for routine electrophysiology followup. She has been living in [place] over the last 2 years and has not been seen here since Nov 2012. She is now back in the [place] with her daughter who accompanies her today. She is residing in a nursing and rehab facility.   Since last being seen in our clinic, she reports she is doing well and has no new complaints. She continues to have frequent falls which is why she is not anticoagulated. She has occasional racing palpitations. She denies chest pain or shortness of breath. She denies dizziness, near syncope or syncope. She denies LE swelling, orthopnea, PND or recent weight gain. She is compliant with medications.  Past Medical History Past Medical History  Diagnosis Date  . Tachycardia-bradycardia syndrome     s/p [employer] PPM implant in [place]  . Paroxysmal atrial fibrillation   . Hypertension   . Frequent falls   . Asthma   . Hyperlipidemia   . Depression   . Anxiety   . Pacemaker   . Stroke     Past Surgical History Past Surgical History  Procedure Laterality Date  . Pacemaker insertion  01/04/2005    [employer] [place] PPM 1290 [phone]), GDT [phone] atrial lead and 4457 V lead all implanted in [place]  . Cholecystectomy    . Gallbladder surgery    . Tonsillectomy and adenoidectomy      Allergies/Intolerances Allergies  Allergen Reactions  . Penicillins Other (See Comments)    Red spots ### Tolerated Rocephin 12/2012 ###    Current Home Medications Current Outpatient Prescriptions  Medication Sig Dispense Refill  .  albuterol (PROVENTIL HFA;VENTOLIN HFA) 108 (90 BASE) MCG/ACT inhaler Inhale 2 puffs into the lungs every 6 (six) hours as needed for wheezing.      Marland Kitchen amLODipine (NORVASC) 10 MG tablet Take 10 mg by mouth at bedtime.      Marland Kitchen aspirin EC 81 MG tablet Take 81 mg by mouth daily.      . cholecalciferol (VITAMIN D) 1000 UNITS tablet Take 1,000 Units by mouth daily.      . ciprofloxacin (CIPRO) 500 MG tablet Take 1 tablet (500 mg total) by mouth 2 (two) times daily.  8 tablet  0  . losartan (COZAAR) 100 MG tablet Take 100 mg by mouth daily.      . montelukast (SINGULAIR) 10 MG tablet Take 10 mg by mouth at bedtime.      . nitroGLYCERIN (NITROSTAT) 0.4 MG SL tablet Place 0.4 mg under the tongue every 5 (five) minutes as needed for chest pain.      . simvastatin (ZOCOR) 40 MG tablet Take 40 mg by mouth daily.      . Tetrahydrozoline HCl (VISINE OP) Apply 1 drop to eye 2 (two) times daily as needed (for dry eyes).      . theophylline (UNIPHYL) 400 MG 24 hr tablet Take 400 mg by mouth daily.       No current facility-administered medications for this visit.    Social History History   Social History  . Marital Status: Widowed    Spouse Name: N/A  Number of Children: 2  . Years of Education: college   Occupational History  .      retired   Social History Main Topics  . Smoking status: Never Smoker   . Smokeless tobacco: Never Used  . Alcohol Use: No  . Drug Use: No  . Sexual Activity: No   Other Topics Concern  . Not on file   Social History Narrative   Recently moved to [place] to live with her daughter.  Previously lived in [place].     Daughter . [name].     Patient has some college education.   Right handed   Caffeine- None     Review of Systems General: ++frequent falls  No chills, fever, night sweats or weight changes Cardiovascular: No chest pain, dyspnea on exertion, edema, orthopnea, paroxysmal nocturnal dyspnea Dermatological: No rash, lesions or  masses Respiratory: No cough, dyspnea Urologic: No hematuria, dysuria Abdominal: No nausea, vomiting, diarrhea, bright red blood per rectum, melena, or hematemesis Neurologic: No visual changes, weakness, changes in mental status All other systems reviewed and are otherwise negative except as noted above.  Physical Exam Vitals: Blood pressure 148/80, pulse 84, height 5\' 6"  (1.676 m), weight 113 lb (51.256 kg), SpO2 94.00%.  General: Well developed, well appearing 79 y.o. female in no acute distress. HEENT: Normocephalic, atraumatic. EOMs intact. Sclera nonicteric. Oropharynx clear.  Neck: Supple. No JVD. Lungs: Respirations regular and unlabored, CTA bilaterally. No wheezes, rales or rhonchi. Heart: RRR. S1, S2 present. No murmurs, rub, S3 or S4. Abdomen: Soft, non-distended.  Extremities: No clubbing, cyanosis or edema. PT/Radials 2+ and equal bilaterally. Psych: Normal affect. Neuro: Alert and oriented X 3. Moves all extremities spontaneously. Skin: Left upper chest / implant site intact and well healed.   Diagnostics Device interrogation today - Normal device function. Thresholds, sensing, impedances consistent with previous measurements. Device programmed to maximize longevity. 29 mode switch episodes since 10/04/2007, longest 2.6 minutes. 26 V-tachy detections since 10/04/2007, available EGMs (3) reviewed and show SVT. Device programmed at appropriate safety margins. Histogram distribution appropriate for patient activity level. Device programmed to optimize intrinsic conduction. Estimated longevity 2 years.  Assessment and Plan 1. Tachy-brady syndrome s/p PPM implant - normal device function - no programming changes made - return to device clinic for follow-up in 6 months - return for follow-up with Dr. Johney Frame in one year 2. PAF - stable - not a candidate for anticoagulation due to frequent falls  Signed, Rick Duff, PA-C 01/25/2013, 4:24 PM

## 2013-01-25 NOTE — Patient Instructions (Signed)
Your physician wants you to follow-up in: 6 MONTHS WITH DEVICE CLINIC You will receive a reminder letter in the mail two months in advance. If you don't receive a letter, please call our office to schedule the follow-up appointment.  Your physician wants you to follow-up in: 1 YEAR WITH DR. ALLRED You will receive a reminder letter in the mail two months in advance. If you don't receive a letter, please call our office to schedule the follow-up appointment.  Your physician recommends that you continue on your current medications as directed. Please refer to the Current Medication list given to you today.

## 2013-01-30 ENCOUNTER — Telehealth: Payer: Self-pay | Admitting: Neurology

## 2013-01-30 NOTE — Telephone Encounter (Signed)
I have called this patient twice to attempt to set up Botox injections and have not received a call back.

## 2013-02-01 ENCOUNTER — Ambulatory Visit: Payer: Medicare Other | Admitting: Neurology

## 2013-02-05 ENCOUNTER — Non-Acute Institutional Stay (SKILLED_NURSING_FACILITY): Payer: Medicare Other | Admitting: Adult Health

## 2013-02-05 ENCOUNTER — Encounter: Payer: Self-pay | Admitting: Adult Health

## 2013-02-05 DIAGNOSIS — F418 Other specified anxiety disorders: Secondary | ICD-10-CM

## 2013-02-05 DIAGNOSIS — I4891 Unspecified atrial fibrillation: Secondary | ICD-10-CM

## 2013-02-05 DIAGNOSIS — I10 Essential (primary) hypertension: Secondary | ICD-10-CM

## 2013-02-05 DIAGNOSIS — F341 Dysthymic disorder: Secondary | ICD-10-CM

## 2013-02-05 DIAGNOSIS — J45909 Unspecified asthma, uncomplicated: Secondary | ICD-10-CM

## 2013-02-05 NOTE — Progress Notes (Signed)
Patient ID: Yvonne Buck, female   DOB: Jan 27, 1930, 79 y.o.   MRN: [id number]     [facility]  Allergies  Allergen Reactions  . Penicillins Other (See Comments)    Red spots ### Tolerated Rocephin 12/2012 ###     Chief Complaint  Patient presents with  . Medical Managment of Chronic Issues    HPI:  She is being seen for the management of her chronic illnesses. Her family is concerned about her having dementia. She is alert to place time person; surroundings. She is have depression and anxiety about being in a facility.    Past Medical History  Diagnosis Date  . Tachycardia-bradycardia syndrome     s/p [employer] PPM implant in [place]  . Paroxysmal atrial fibrillation   . Hypertension   . Frequent falls   . Asthma   . Hyperlipidemia   . Depression   . Anxiety   . Pacemaker   . Stroke     Past Surgical History  Procedure Laterality Date  . Pacemaker insertion  01/04/2005    [employer] [place] PPM 1290 [phone]), GDT [phone] atrial lead and 4457 V lead all implanted in [place]  . Cholecystectomy    . Gallbladder surgery    . Tonsillectomy and adenoidectomy      VITAL SIGNS BP 136/72  Pulse 82  Ht 5\' 5"  (1.651 m)  Wt 108 lb (48.988 kg)  BMI 17.97 kg/m2   Patient's Medications  New Prescriptions   No medications on file  Previous Medications   ALBUTEROL (PROVENTIL HFA;VENTOLIN HFA) 108 (90 BASE) MCG/ACT INHALER    Inhale 2 puffs into the lungs every 6 (six) hours as needed for wheezing.   AMLODIPINE (NORVASC) 10 MG TABLET    Take 10 mg by mouth at bedtime.   ASPIRIN EC 81 MG TABLET    Take 81 mg by mouth daily.   CHOLECALCIFEROL (VITAMIN D) 1000 UNITS TABLET    Take 1,000 Units by mouth daily.   FLUTICASONE (FLOVENT HFA) 110 MCG/ACT INHALER    Inhale 1 puff into the lungs daily.   LOSARTAN (COZAAR) 100 MG TABLET    Take 100 mg by mouth daily.   MONTELUKAST (SINGULAIR) 10 MG TABLET    Take 10 mg by mouth at bedtime.   NITROGLYCERIN (NITROSTAT) 0.4  MG SL TABLET    Place 0.4 mg under the tongue every 5 (five) minutes as needed for chest pain.   SIMVASTATIN (ZOCOR) 40 MG TABLET    Take 40 mg by mouth daily.   TETRAHYDROZOLINE HCL (VISINE OP)    Apply 1 drop to eye 2 (two) times daily as needed (for dry eyes).   THEOPHYLLINE (UNIPHYL) 400 MG 24 HR TABLET    Take 400 mg by mouth daily.  Modified Medications   No medications on file  Discontinued Medications   CIPROFLOXACIN (CIPRO) 500 MG TABLET    Take 1 tablet (500 mg total) by mouth 2 (two) times daily.    SIGNIFICANT DIAGNOSTIC EXAMS    LABS REVIEWED:   01-07-13: wbc 8.7;hgb 12.5; hct 41.0; mcv 91.3;plt 362;glucose 167; bun 25; creat 0.8; k+3.3; na++138;  Liver normal albumin 3.7; chol 160; ldl 88; trig 57    Review of Systems  Constitutional: Negative for malaise/fatigue.  Eyes: Negative for blurred vision.  Respiratory: Negative for cough and shortness of breath.   Cardiovascular: Negative for chest pain, palpitations and leg swelling.  Gastrointestinal: Negative for heartburn and abdominal pain.  Musculoskeletal: Negative for joint  pain and myalgias.  Skin: Negative.   Neurological: Negative for dizziness and headaches.  Psychiatric/Behavioral: Positive for depression. Negative for memory loss. The patient is nervous/anxious.     Physical Exam  Constitutional: She is oriented to person, place, and time. No distress.  frail  Neck: Neck supple. No JVD present.  Cardiovascular: Normal rate, regular rhythm and intact distal pulses.   Respiratory: Effort normal and breath sounds normal. No respiratory distress. She has no wheezes.  GI: Soft. Bowel sounds are normal. She exhibits no distension. There is no tenderness.  Musculoskeletal: Normal range of motion.  Neurological: She is alert and oriented to person, place, and time.  Skin: Skin is warm and dry. She is not diaphoretic.  Psychiatric: She has a normal mood and affect.     ASSESSMENT/ PLAN:  1. Hypertension: is  stable is taking cozaar 100 mg daily; norvasc 10 mg daily   2. Asthma: is stable will continue flovent inhaler daily; albuterol 2 puffs every 6 hours as needed; singulair 10 mg nightly is staking theophylline er 400 mg daily.   3. Afib: is stable has pacemaker; asa 81 mg daily  4. Depression with anxiety: will begin zoloft 50 mg daily for one week then increase to 100 mg daily and will continue to monitor her status. I have left a message for her family. There may be some underlying dementia issues present; however; her mood state will need to be treated first.

## 2013-03-07 ENCOUNTER — Encounter: Payer: Self-pay | Admitting: Internal Medicine

## 2013-03-14 ENCOUNTER — Non-Acute Institutional Stay (SKILLED_NURSING_FACILITY): Payer: Medicare Other | Admitting: Internal Medicine

## 2013-03-14 DIAGNOSIS — J45909 Unspecified asthma, uncomplicated: Secondary | ICD-10-CM

## 2013-03-14 DIAGNOSIS — I4891 Unspecified atrial fibrillation: Secondary | ICD-10-CM

## 2013-03-14 DIAGNOSIS — I10 Essential (primary) hypertension: Secondary | ICD-10-CM

## 2013-03-14 DIAGNOSIS — E785 Hyperlipidemia, unspecified: Secondary | ICD-10-CM | POA: Insufficient documentation

## 2013-03-14 NOTE — Progress Notes (Signed)
Patient ID: Yvonne Buck, female   DOB: [birth year], 80 y.o.   MRN: [id number]    [facility]    PCP: Barbette Merino, MD  Code Status: full code  Allergies  Allergen Reactions  . Penicillins Other (See Comments)    Red spots ### Tolerated Rocephin 12/2012 ###    Chief Complaint: RV  HPI:  80 y/o female pt is seen for routine visit. She denies any complaints. No concerns from staff.   Review of Systems  Constitutional: Negative for fever, chills, weight loss, malaise/fatigue and diaphoresis.  HENT: Negative for congestion, hearing loss and sore throat.   Eyes: Negative for blurred vision, double vision and discharge.  Respiratory: Negative for cough, sputum production, shortness of breath and wheezing.   Cardiovascular: Negative for chest pain, palpitations, orthopnea and leg swelling.  Gastrointestinal: Negative for heartburn, nausea, vomiting, abdominal pain, diarrhea and constipation.  Genitourinary: Negative for dysuria, urgency, frequency and flank pain.  Musculoskeletal: Negative for back pain, falls, joint pain and myalgias.  Skin: Negative for itching and rash.  Neurological: Negative for dizziness, tingling, focal weakness and headaches.  Psychiatric/Behavioral: Negative for depression and memory loss. The patient is not nervous/anxious.     Past Medical History  Diagnosis Date  . Tachycardia-bradycardia syndrome     s/p [employer] PPM implant in [place]  . Paroxysmal atrial fibrillation   . Hypertension   . Frequent falls   . Asthma   . Hyperlipidemia   . Depression   . Anxiety   . Pacemaker   . Stroke    Past Surgical History  Procedure Laterality Date  . Pacemaker insertion  01/04/2005    [place] PPM 1290 [phone]), GDT [phone] atrial lead and 4457 V lead all implanted in [place]  . Cholecystectomy    . Gallbladder surgery    . Tonsillectomy and adenoidectomy     Social History:   reports that she has never smoked. She has  never used smokeless tobacco. She reports that she does not drink alcohol or use illicit drugs.  Family History  Problem Relation Age of Onset  . Diabetes Mother   . High blood pressure Mother   . Heart Problems Father     Medications: Patient's Medications  New Prescriptions   No medications on file  Previous Medications   ALBUTEROL (PROVENTIL HFA;VENTOLIN HFA) 108 (90 BASE) MCG/ACT INHALER    Inhale 2 puffs into the lungs every 6 (six) hours as needed for wheezing.   AMLODIPINE (NORVASC) 10 MG TABLET    Take 10 mg by mouth at bedtime.   ASPIRIN EC 81 MG TABLET    Take 81 mg by mouth daily.   CHOLECALCIFEROL (VITAMIN D) 1000 UNITS TABLET    Take 1,000 Units by mouth daily.   FLUTICASONE (FLOVENT HFA) 110 MCG/ACT INHALER    Inhale 1 puff into the lungs daily.   LOSARTAN (COZAAR) 100 MG TABLET    Take 100 mg by mouth daily.   MONTELUKAST (SINGULAIR) 10 MG TABLET    Take 10 mg by mouth at bedtime.   NITROGLYCERIN (NITROSTAT) 0.4 MG SL TABLET    Place 0.4 mg under the tongue every 5 (five) minutes as needed for chest pain.   SIMVASTATIN (ZOCOR) 40 MG TABLET    Take 40 mg by mouth daily.   TETRAHYDROZOLINE HCL (VISINE OP)    Apply 1 drop to eye 2 (two) times daily as needed (for dry eyes).   THEOPHYLLINE (UNIPHYL) 400 MG 24  HR TABLET    Take 400 mg by mouth daily.  Modified Medications   No medications on file  Discontinued Medications   No medications on file     Physical Exam: BP 140/74  Pulse 78  Temp(Src) 97.2 F (36.2 C)  Resp 18  SpO2 98%  Constitutional: She is oriented to person, place, and time. No distress. frail  Neck: Neck supple. No JVD present.  Cardiovascular: Normal rate, regular rhythm and intact distal pulses.   Respiratory: Effort normal and breath sounds normal. No respiratory distress. She has no wheezes.  GI: Soft. Bowel sounds are normal. She exhibits no distension. There is no tenderness.  Musculoskeletal: Normal range of motion.  Neurological: She is  alert and oriented to person, place, and time.  Skin: Skin is warm and dry. She is not diaphoretic.  Psychiatric: She has a normal mood and affect.    Labs reviewed: Basic Metabolic Panel:  Recent Labs  01/01/13 0515 01/02/13 0831 01/03/13 0530  NA 139 140 139  K 3.4* 3.1* 4.0  CL 107 105 105  CO2 25 27 29   GLUCOSE 92 166* 98  BUN 19 16 22   CREATININE 0.79 0.58 0.72  CALCIUM 9.2 9.5 9.8   Liver Function Tests:  Recent Labs  12/31/12 1125 01/01/13 0515 01/03/13 0530  AST 17 14 15   ALT 15 11 12   ALKPHOS 101 85 82  BILITOT 0.5 0.5 0.3  PROT 6.8 6.0 6.1  ALBUMIN 3.6 3.0* 2.9*   No results found for this basename: LIPASE, AMYLASE,  in the last 8760 hours No results found for this basename: AMMONIA,  in the last 8760 hours CBC:  Recent Labs  12/14/12 0857  12/31/12 1125 12/31/12 1717 01/01/13 0515 01/03/13 0530  WBC 7.5  --  8.5 9.5 8.1 8.0  NEUTROABS 3.3  --  6.5  --   --  3.2  HGB 14.2  < > 12.9 12.5 11.5* 12.1  HCT 42.5  < > 39.1 38.3 35.3* 36.3  MCV 88.0  --  88.3 88.9 89.6 89.2  PLT 307  --  361 349 284 335  < > = values in this interval not displayed.  LABS REVIEWED:   01-07-13: wbc 8.7;hgb 12.5; hct 41.0; mcv 91.3;plt 362;glucose 167; bun 25; creat 0.8; k+3.3; na++138;  Liver normal albumin 3.7; chol 160; ldl 88; trig 57  02-27-13 theophylline 3.9 low  Assessment/Plan  Afib: stable. continue asa 81 mg daily, has a pacemaker  Hyperlipidemia: continue zocor for now  Hypertension: is stable on cozaar 100 mg daily and norvasc 10 mg daily. Monitor renal function  Asthma: breathing remains stable. Has low theophylline level. No recent asthma exacerbation. Continue flovent inhaler daily, theophylline 400 mg daily, albuterol 2 puffs every 6 hours as needed; singulair 10 mg nightly. Consider decreasing theophylline next visit if her breathing remains stable

## 2013-04-02 ENCOUNTER — Encounter (HOSPITAL_COMMUNITY): Payer: Self-pay | Admitting: Emergency Medicine

## 2013-04-02 ENCOUNTER — Emergency Department (HOSPITAL_COMMUNITY): Payer: Medicare Other

## 2013-04-02 ENCOUNTER — Encounter: Payer: Self-pay | Admitting: Adult Health

## 2013-04-02 ENCOUNTER — Non-Acute Institutional Stay (SKILLED_NURSING_FACILITY): Payer: Medicare Other | Admitting: Adult Health

## 2013-04-02 ENCOUNTER — Observation Stay (HOSPITAL_COMMUNITY)
Admission: EM | Admit: 2013-04-02 | Discharge: 2013-04-03 | Disposition: A | Discharge status: Skilled Nursing Facility | Payer: Medicare Other | Attending: Internal Medicine | Admitting: Internal Medicine

## 2013-04-02 DIAGNOSIS — N390 Urinary tract infection, site not specified: Secondary | ICD-10-CM

## 2013-04-02 DIAGNOSIS — S72002A Fracture of unspecified part of neck of left femur, initial encounter for closed fracture: Secondary | ICD-10-CM | POA: Diagnosis present

## 2013-04-02 DIAGNOSIS — S72112A Displaced fracture of greater trochanter of left femur, initial encounter for closed fracture: Secondary | ICD-10-CM | POA: Diagnosis present

## 2013-04-02 DIAGNOSIS — S72109A Unspecified trochanteric fracture of unspecified femur, initial encounter for closed fracture: Principal | ICD-10-CM

## 2013-04-02 DIAGNOSIS — E785 Hyperlipidemia, unspecified: Secondary | ICD-10-CM

## 2013-04-02 DIAGNOSIS — I69959 Hemiplegia and hemiparesis following unspecified cerebrovascular disease affecting unspecified side: Secondary | ICD-10-CM | POA: Insufficient documentation

## 2013-04-02 DIAGNOSIS — D472 Monoclonal gammopathy: Secondary | ICD-10-CM | POA: Insufficient documentation

## 2013-04-02 DIAGNOSIS — F418 Other specified anxiety disorders: Secondary | ICD-10-CM

## 2013-04-02 DIAGNOSIS — R2681 Unsteadiness on feet: Secondary | ICD-10-CM

## 2013-04-02 DIAGNOSIS — J45909 Unspecified asthma, uncomplicated: Secondary | ICD-10-CM

## 2013-04-02 DIAGNOSIS — I495 Sick sinus syndrome: Secondary | ICD-10-CM

## 2013-04-02 DIAGNOSIS — F329 Major depressive disorder, single episode, unspecified: Secondary | ICD-10-CM | POA: Insufficient documentation

## 2013-04-02 DIAGNOSIS — I10 Essential (primary) hypertension: Secondary | ICD-10-CM | POA: Diagnosis present

## 2013-04-02 DIAGNOSIS — F3289 Other specified depressive episodes: Secondary | ICD-10-CM | POA: Insufficient documentation

## 2013-04-02 DIAGNOSIS — S72009A Fracture of unspecified part of neck of unspecified femur, initial encounter for closed fracture: Secondary | ICD-10-CM

## 2013-04-02 DIAGNOSIS — G8114 Spastic hemiplegia affecting left nondominant side: Secondary | ICD-10-CM | POA: Diagnosis present

## 2013-04-02 DIAGNOSIS — Z9181 History of falling: Secondary | ICD-10-CM | POA: Insufficient documentation

## 2013-04-02 DIAGNOSIS — E039 Hypothyroidism, unspecified: Secondary | ICD-10-CM

## 2013-04-02 DIAGNOSIS — F411 Generalized anxiety disorder: Secondary | ICD-10-CM | POA: Insufficient documentation

## 2013-04-02 DIAGNOSIS — Z950 Presence of cardiac pacemaker: Secondary | ICD-10-CM | POA: Insufficient documentation

## 2013-04-02 DIAGNOSIS — I4891 Unspecified atrial fibrillation: Secondary | ICD-10-CM | POA: Diagnosis present

## 2013-04-02 DIAGNOSIS — W010XXA Fall on same level from slipping, tripping and stumbling without subsequent striking against object, initial encounter: Secondary | ICD-10-CM | POA: Insufficient documentation

## 2013-04-02 DIAGNOSIS — W19XXXA Unspecified fall, initial encounter: Secondary | ICD-10-CM | POA: Diagnosis present

## 2013-04-02 DIAGNOSIS — Y9210 Unspecified residential institution as the place of occurrence of the external cause: Secondary | ICD-10-CM | POA: Insufficient documentation

## 2013-04-02 HISTORY — DX: Fracture of unspecified part of neck of left femur, initial encounter for closed fracture: S72.002A

## 2013-04-02 HISTORY — DX: Displaced fracture of greater trochanter of left femur, initial encounter for closed fracture: S72.112A

## 2013-04-02 LAB — BASIC METABOLIC PANEL
BUN: 15 mg/dL (ref 6–23)
CALCIUM: 9.2 mg/dL (ref 8.4–10.5)
CO2: 27 mEq/L (ref 19–32)
CREATININE: 0.61 mg/dL (ref 0.50–1.10)
Chloride: 104 mEq/L (ref 96–112)
GFR calc Af Amer: >90 mL/min (ref >90)
GFR, EST NON AFRICAN AMERICAN: 83 mL/min — AB (ref >90)
GLUCOSE: 105 mg/dL — AB (ref 70–99)
Potassium: 3.4 mEq/L — ABNORMAL LOW (ref 3.7–5.3)
Sodium: 143 mEq/L (ref 137–147)

## 2013-04-02 LAB — CBC WITH DIFFERENTIAL/PLATELET
BASOS PCT: 0 % (ref 0–1)
Basophils Absolute: 0 10*3/uL (ref 0.0–0.1)
EOS ABS: 1.3 10*3/uL — AB (ref 0.0–0.7)
Eosinophils Relative: 17 % — ABNORMAL HIGH (ref 0–5)
HCT: 42.2 % (ref 36.0–46.0)
Hemoglobin: 13.9 g/dL (ref 12.0–15.0)
Lymphocytes Relative: 16 % (ref 12–46)
Lymphs Abs: 1.2 10*3/uL (ref 0.7–4.0)
MCH: 28.7 pg (ref 26.0–34.0)
MCHC: 32.9 g/dL (ref 30.0–36.0)
MCV: 87.2 fL (ref 78.0–100.0)
Monocytes Absolute: 0.8 10*3/uL (ref 0.1–1.0)
Monocytes Relative: 10 % (ref 3–12)
NEUTROS PCT: 57 % (ref 43–77)
Neutro Abs: 4.5 10*3/uL (ref 1.7–7.7)
PLATELETS: 292 10*3/uL (ref 150–400)
RBC: 4.84 MIL/uL (ref 3.87–5.11)
RDW: 13.4 % (ref 11.5–15.5)
WBC: 7.8 10*3/uL (ref 4.0–10.5)

## 2013-04-02 LAB — PROTIME-INR
INR: 0.93 (ref 0.00–1.49)
Prothrombin Time: 12.3 seconds (ref 11.6–15.2)

## 2013-04-02 LAB — TYPE AND SCREEN
ABO/RH(D): O POS
ANTIBODY SCREEN: NEGATIVE

## 2013-04-02 LAB — ABO/RH: ABO/RH(D): O POS

## 2013-04-02 MED ORDER — HYDROCODONE-ACETAMINOPHEN 5-325 MG PO TABS: 1.0000 | ORAL_TABLET | ORAL | Status: DC | PRN

## 2013-04-02 MED ORDER — POTASSIUM CHLORIDE IN NACL 20-0.45 MEQ/L-% IV SOLN
INTRAVENOUS | Status: DC
  Administered 2013-04-02: via INTRAVENOUS
  Filled 2013-04-02 (×2): qty 1000

## 2013-04-02 MED ORDER — MORPHINE SULFATE 2 MG/ML IJ SOLN: 0.5000 mg | INTRAMUSCULAR | Status: DC | PRN

## 2013-04-02 MED ORDER — NITROGLYCERIN 0.4 MG SL SUBL: 0.4000 mg | SUBLINGUAL_TABLET | SUBLINGUAL | Status: DC | PRN

## 2013-04-02 MED ORDER — ONDANSETRON HCL 4 MG/2ML IJ SOLN
4.0000 mg | Freq: Once | INTRAMUSCULAR | Status: AC | PRN
  Administered 2013-04-02: 4 mg via INTRAVENOUS
  Filled 2013-04-02: qty 2

## 2013-04-02 MED ORDER — HYDROMORPHONE HCL PF 1 MG/ML IJ SOLN
0.5000 mg | INTRAMUSCULAR | Status: DC | PRN
  Administered 2013-04-02: 0.5 mg via INTRAVENOUS
  Filled 2013-04-02: qty 1

## 2013-04-02 MED ORDER — TRAMADOL HCL 50 MG PO TABS: 50.0000 mg | ORAL_TABLET | Freq: Four times a day (QID) | ORAL | Status: DC | PRN

## 2013-04-02 MED ORDER — MORPHINE SULFATE 2 MG/ML IJ SOLN: 1.0000 mg | INTRAMUSCULAR | Status: DC | PRN

## 2013-04-02 MED ORDER — SIMVASTATIN 40 MG PO TABS
40.0000 mg | ORAL_TABLET | Freq: Every day | ORAL | Status: DC
  Administered 2013-04-02: 40 mg via ORAL
  Filled 2013-04-02 (×2): qty 1

## 2013-04-02 MED ORDER — SODIUM CHLORIDE 0.9 % IV SOLN
1000.0000 mL | INTRAVENOUS | Status: DC
  Administered 2013-04-02: 1000 mL via INTRAVENOUS

## 2013-04-02 MED ORDER — VITAMIN D3 25 MCG (1000 UNIT) PO TABS
1000.0000 [IU] | ORAL_TABLET | Freq: Every day | ORAL | Status: DC
  Administered 2013-04-03: 1000 [IU] via ORAL
  Filled 2013-04-02: qty 1

## 2013-04-02 MED ORDER — AMLODIPINE BESYLATE 10 MG PO TABS
10.0000 mg | ORAL_TABLET | Freq: Every day | ORAL | Status: DC
  Administered 2013-04-02: 10 mg via ORAL
  Filled 2013-04-02 (×2): qty 1

## 2013-04-02 MED ORDER — FLUTICASONE PROPIONATE HFA 110 MCG/ACT IN AERO
1.0000 | INHALATION_SPRAY | Freq: Every day | RESPIRATORY_TRACT | Status: DC
  Administered 2013-04-03: 1 via RESPIRATORY_TRACT
  Filled 2013-04-02 (×2): qty 12

## 2013-04-02 MED ORDER — ONDANSETRON HCL 4 MG/2ML IJ SOLN
4.0000 mg | Freq: Three times a day (TID) | INTRAMUSCULAR | Status: AC | PRN
  Administered 2013-04-03: 4 mg via INTRAVENOUS
  Filled 2013-04-02: qty 2

## 2013-04-02 MED ORDER — ALBUTEROL SULFATE HFA 108 (90 BASE) MCG/ACT IN AERS: 2.0000 | INHALATION_SPRAY | Freq: Four times a day (QID) | RESPIRATORY_TRACT | Status: DC | PRN

## 2013-04-02 MED ORDER — MONTELUKAST SODIUM 10 MG PO TABS
10.0000 mg | ORAL_TABLET | Freq: Every day | ORAL | Status: DC
  Administered 2013-04-02: 10 mg via ORAL
  Filled 2013-04-02 (×2): qty 1

## 2013-04-02 MED ORDER — THEOPHYLLINE ER 400 MG PO TB24
400.0000 mg | ORAL_TABLET | Freq: Every day | ORAL | Status: DC
  Administered 2013-04-03: 400 mg via ORAL
  Filled 2013-04-02: qty 1

## 2013-04-02 MED ORDER — HYDROCODONE-ACETAMINOPHEN 5-325 MG PO TABS: 1.0000 | ORAL_TABLET | Freq: Four times a day (QID) | ORAL | Status: DC | PRN

## 2013-04-02 NOTE — Progress Notes (Signed)
Pt arrived to unit at 2145. Positioned comfortably in bed. Rates pain after movement at "3". Pt is A&Ox4. +CMS. Skin intact, sacrum slightly pink. Contacted pt's daughter, Huston Foley, who is HCPOA to ensure she was aware of pt's arrival to hospital. She stated she had not been told as of yet the pt has a hip fx. Informed daughter of the plan for Ortho MD consult and possible surgery. Per the daughter the pt may have some underlying undiagnosed dementia, as before she went to [facility] she was running away from home in freezing temperatures and eventually police were called to find the pt, which led to her placement at [facility]. She states she does have some confusion at times. Also stated she has a history of delirium with anesthesia, with great confusion and belligerent. Pt currently resting comfortably in bed. Pt informed of being NPO after midnight for possible surgery tomorrow. Will continue to monitor.

## 2013-04-02 NOTE — ED Notes (Signed)
Pt arrived from [facility] by [facility] and they report pt fell Last night and facility took XR and confirmed a L Hip FX

## 2013-04-02 NOTE — ED Notes (Signed)
Patient returned from XR. 

## 2013-04-02 NOTE — ED Provider Notes (Signed)
CSN: [id number]     Arrival date & time 04/02/13  1839 History   First MD Initiated Contact with Patient 04/02/13 1849     Chief Complaint  Patient presents with  . Hip Pain    L Hip Fx   HPI The patient presents to the emergency room with complaints of left hip pain. Patient states she had a fall last night in the bathroom. She landed on her left hip. Since that time the patient has had pain in her left hip and she states it's very difficult to walk.  She denies any other injuries., She denies knee pain or chest pain. She denies any head injury or loss of consciousness. She denies any nausea vomiting or diarrhea. Patient is residing in a nursing facility. She had x-rays today that showed questionable left greater trochanter fracture. She was sent to the emergency room for further treatment and evaluation. Past Medical History  Diagnosis Date  . Tachycardia-bradycardia syndrome     s/p [employer] PPM implant in [place]  . Paroxysmal atrial fibrillation   . Hypertension   . Frequent falls   . Asthma   . Hyperlipidemia   . Depression   . Anxiety   . Pacemaker   . Stroke    Past Surgical History  Procedure Laterality Date  . Pacemaker insertion  01/04/2005    [facility] PPM 1290 [phone]), GDT [phone] atrial lead and 4457 V lead all implanted in [place]  . Cholecystectomy    . Gallbladder surgery    . Tonsillectomy and adenoidectomy     Family History  Problem Relation Age of Onset  . Diabetes Mother   . High blood pressure Mother   . Heart Problems Father    History  Substance Use Topics  . Smoking status: Never Smoker   . Smokeless tobacco: Never Used  . Alcohol Use: No   OB History   Grav Para Term Preterm Abortions TAB SAB Ect Mult Living                 Review of Systems  All other systems reviewed and are negative.      Allergies  Penicillins  Home Medications   Current Outpatient Rx  Name  Route  Sig  Dispense  Refill  . albuterol  (PROVENTIL HFA;VENTOLIN HFA) 108 (90 BASE) MCG/ACT inhaler   Inhalation   Inhale 2 puffs into the lungs every 6 (six) hours as needed for wheezing.         Marland Kitchen amLODipine (NORVASC) 10 MG tablet   Oral   Take 10 mg by mouth at bedtime.         Marland Kitchen aspirin EC 81 MG tablet   Oral   Take 81 mg by mouth daily.         . cholecalciferol (VITAMIN D) 1000 UNITS tablet   Oral   Take 1,000 Units by mouth daily.         . fluticasone (FLOVENT HFA) 110 MCG/ACT inhaler   Inhalation   Inhale 1 puff into the lungs daily.         Marland Kitchen losartan (COZAAR) 100 MG tablet   Oral   Take 100 mg by mouth daily.         . montelukast (SINGULAIR) 10 MG tablet   Oral   Take 10 mg by mouth at bedtime.         . nitroGLYCERIN (NITROSTAT) 0.4 MG SL tablet   Sublingual   Place  0.4 mg under the tongue every 5 (five) minutes as needed for chest pain.         . simvastatin (ZOCOR) 40 MG tablet   Oral   Take 40 mg by mouth daily.         . Tetrahydrozoline HCl (VISINE OP)   Ophthalmic   Apply 1 drop to eye 2 (two) times daily as needed (for dry eyes).         . theophylline (UNIPHYL) 400 MG 24 hr tablet   Oral   Take 400 mg by mouth daily.         . traMADol (ULTRAM) 50 MG tablet   Oral   Take 1 tablet (50 mg total) by mouth every 6 (six) hours as needed.   30 tablet   0    BP 163/82  Pulse 77  Temp(Src) 98.4 F (36.9 C) (Oral)  Resp 16  Ht 5\' 6"  (1.676 m)  Wt 140 lb (63.504 kg)  BMI 22.61 kg/m2  SpO2 97% Physical Exam  Nursing note and vitals reviewed. Constitutional: No distress.  HENT:  Head: Normocephalic and atraumatic.  Right Ear: External ear normal.  Left Ear: External ear normal.  Eyes: Conjunctivae are normal. Right eye exhibits no discharge. Left eye exhibits no discharge. No scleral icterus.  Neck: Neck supple. No tracheal deviation present.  Cardiovascular: Normal rate, regular rhythm and intact distal pulses.   Pulmonary/Chest: Effort normal and breath  sounds normal. No stridor. No respiratory distress. She has no wheezes. She has no rales.  Abdominal: Soft. Bowel sounds are normal. She exhibits no distension. There is no tenderness. There is no rebound and no guarding.  Musculoskeletal: She exhibits tenderness. She exhibits no edema.       Left hip: She exhibits decreased strength and bony tenderness. She exhibits no swelling and no deformity.       Cervical back: Normal.       Thoracic back: Normal.       Lumbar back: Normal.  Neurological: She is alert. She has normal strength. No cranial nerve deficit (no facial droop, extraocular movements intact, no slurred speech) or sensory deficit. She exhibits normal muscle tone. She displays no seizure activity. Coordination normal.  Skin: Skin is warm and dry. No rash noted.  Psychiatric: She has a normal mood and affect.    ED Course  Procedures (including critical care time) Labs Review Labs Reviewed  BASIC METABOLIC PANEL - Abnormal; Notable for the following:    Potassium 3.4 (*)    Glucose, Bld 105 (*)    GFR calc non Af Amer 83 (*)    All other components within normal limits  CBC WITH DIFFERENTIAL - Abnormal; Notable for the following:    Eosinophils Relative 17 (*)    Eosinophils Absolute 1.3 (*)    All other components within normal limits  PROTIME-INR  TYPE AND SCREEN  ABO/RH   Imaging Review Dg Hip Complete Left  04/02/2013   CLINICAL DATA:  [place] last night.  Left hip pain.  EXAM: LEFT HIP - COMPLETE 2+ VIEW  COMPARISON:  None.  FINDINGS: There is subtle lucency at the greater trochanter, consistent with avulsion of the greater trochanter. The femoral head appears intact. No evidence for dislocation. The acetabulum appears intact. The right hip is unremarkable in appearance. Degenerative changes are seen in the lower lumbar spine.  Bowel gas pattern is nonobstructive. There is a large amount of stool throughout nondilated loops of colon however. There is dense  atherosclerotic  calcification of the femoral arteries bilaterally.  IMPRESSION: Minimally displaced fracture of the left greater trochanter.   Electronically Signed   By: Shon Hale M.D.   On: 04/02/2013 19:32    EKG ordered for pre-op purposes.   EKG  Rate: 75  Rhythm: normal sinus rhythm  QRS Axis: normal  Intervals: normal  ST/T Wave abnormalities: normal  Conduction Disutrbances:none  Narrative Interpretation:   Old EKG Reviewed: no SIGNIFICANT CHANGE  MDM   Final diagnoses:  Fracture of greater trochanter of left femur    Discussed with Dr Percell Miller.  Pt may not require surgery.  Will admit to medicine and he will consult for admission.    Kathalene Frames, MD 04/05/13 [phone]

## 2013-04-02 NOTE — H&P (Signed)
PCP:   Barbette Merino, MD   Chief Complaint:  Hip pain  HPI: 80 yo female fell last night at assisted living going to the bathroom mechanical fall, has been having left hip pain since and difficult to walk.  No recent illnesses.  Not so much pain when she moves hip just to bear weight.    Review of Systems:  Positive and negative as per HPI otherwise all other systems are negative  Past Medical History: Past Medical History  Diagnosis Date  . Tachycardia-bradycardia syndrome     s/p [employer] PPM implant in [place]  . Paroxysmal atrial fibrillation   . Hypertension   . Frequent falls   . Asthma   . Hyperlipidemia   . Depression   . Anxiety   . Pacemaker   . Stroke    Past Surgical History  Procedure Laterality Date  . Pacemaker insertion  01/04/2005    [place] PPM 1290 [phone]), GDT [phone] atrial lead and 4457 V lead all implanted in [place]  . Cholecystectomy    . Gallbladder surgery    . Tonsillectomy and adenoidectomy      Medications: Prior to Admission medications   Medication Sig Start Date End Date Taking? Authorizing Provider  albuterol (PROVENTIL HFA;VENTOLIN HFA) 108 (90 BASE) MCG/ACT inhaler Inhale 2 puffs into the lungs every 6 (six) hours as needed for wheezing.    Historical Provider, MD  amLODipine (NORVASC) 10 MG tablet Take 10 mg by mouth at bedtime.    Historical Provider, MD  aspirin EC 81 MG tablet Take 81 mg by mouth daily.    Historical Provider, MD  cholecalciferol (VITAMIN D) 1000 UNITS tablet Take 1,000 Units by mouth daily.    Historical Provider, MD  fluticasone (FLOVENT HFA) 110 MCG/ACT inhaler Inhale 1 puff into the lungs daily.    Historical Provider, MD  losartan (COZAAR) 100 MG tablet Take 100 mg by mouth daily.    Historical Provider, MD  montelukast (SINGULAIR) 10 MG tablet Take 10 mg by mouth at bedtime.    Historical Provider, MD  nitroGLYCERIN (NITROSTAT) 0.4 MG SL tablet Place 0.4 mg under the tongue every 5 (five)  minutes as needed for chest pain.    Historical Provider, MD  simvastatin (ZOCOR) 40 MG tablet Take 40 mg by mouth daily.    Historical Provider, MD  Tetrahydrozoline HCl (VISINE OP) Apply 1 drop to eye 2 (two) times daily as needed (for dry eyes).    Historical Provider, MD  theophylline (UNIPHYL) 400 MG 24 hr tablet Take 400 mg by mouth daily.    Historical Provider, MD  traMADol (ULTRAM) 50 MG tablet Take 1 tablet (50 mg total) by mouth every 6 (six) hours as needed. 04/02/13   Gerlene Fee, NP    Allergies:   Allergies  Allergen Reactions  . Penicillins Other (See Comments)    Red spots ### Tolerated Rocephin 12/2012 ###    Social History:  reports that she has never smoked. She has never used smokeless tobacco. She reports that she does not drink alcohol or use illicit drugs.  Family History: Family History  Problem Relation Age of Onset  . Diabetes Mother   . High blood pressure Mother   . Heart Problems Father     Physical Exam: Filed Vitals:   04/02/13 1858 04/02/13 1859 04/02/13 1935  BP:  181/82 163/82  Pulse:  79 77  Temp:  98.4 F (36.9 C)   TempSrc:  Oral  Resp:  16 16  Height: 5\' 6"  (1.676 m)    Weight: 63.504 kg (140 lb)    SpO2:  97% 97%   General appearance: alert, cooperative and no distress Head: Normocephalic, without obvious abnormality, atraumatic Eyes: negative Nose: Nares normal. Septum midline. Mucosa normal. No drainage or sinus tenderness. Neck: no JVD and supple, symmetrical, trachea midline Lungs: clear to auscultation bilaterally Heart: regular rate and rhythm, S1, S2 normal, no murmur, click, rub or gallop Abdomen: soft, non-tender; bowel sounds normal; no masses,  no organomegaly Extremities: extremities normal, atraumatic, no cyanosis or edema Pulses: 2+ and symmetric Skin: Skin color, texture, turgor normal. No rashes or lesions Neurologic: Grossly normal    Labs on Admission:   Recent Labs  04/02/13 1858  NA 143  K  3.4*  CL 104  CO2 27  GLUCOSE 105*  BUN 15  CREATININE 0.61  CALCIUM 9.2    Radiological Exams on Admission: Dg Hip Complete Left  04/02/2013   CLINICAL DATA:  [place] last night.  Left hip pain.  EXAM: LEFT HIP - COMPLETE 2+ VIEW  COMPARISON:  None.  FINDINGS: There is subtle lucency at the greater trochanter, consistent with avulsion of the greater trochanter. The femoral head appears intact. No evidence for dislocation. The acetabulum appears intact. The right hip is unremarkable in appearance. Degenerative changes are seen in the lower lumbar spine.  Bowel gas pattern is nonobstructive. There is a large amount of stool throughout nondilated loops of colon however. There is dense atherosclerotic calcification of the femoral arteries bilaterally.  IMPRESSION: Minimally displaced fracture of the left greater trochanter.   Electronically Signed   By: Shon Hale M.D.   On: 04/02/2013 19:32    Assessment/Plan  80 yo femael with left minimally displaced greater trochanter fracture  Principal Problem:   Fracture of greater trochanter of left femur-  Ortho service to see.  Will keep npo in case needs surgical repair in am.  May not need surgery however, await ortho recs.  Place on hip fracture pathway.  Obtain 12 lead ekg in case needs surgical repair.  obs until plan clarified.  Pain meds ordered.  Active Problems:   Hypertension   Fall   Atrial fibrillation   Left spastic hemiparesis    Yvonne Buck A 04/02/2013, 8:05 PM

## 2013-04-02 NOTE — Progress Notes (Signed)
Patient ID: Yvonne Buck, female   DOB: July 29, 1929, 80 y.o.   MRN: [id number]     [facility]  Allergies  Allergen Reactions  . Penicillins Other (See Comments)    Red spots ### Tolerated Rocephin 12/2012 ###     Chief Complaint  Patient presents with  . Acute Visit    left leg pain     HPI:  She had a fall yesterday, and at that time there was no report of injury present. Today she is complaining of left knee and leg pain. Upon exam her left knee is not tender to palpation; there is a small hematoma on her left lower leg. She has tenderness to palpation to her left hip; she does exhibit external rotation to the left leg. I was concerned for fracture her x-ray was positive for hip fracture. She is to be sent to the ed for further treatment options.    Past Medical History  Diagnosis Date  . Tachycardia-bradycardia syndrome     s/p [employer] PPM implant in [place]  . Paroxysmal atrial fibrillation   . Hypertension   . Frequent falls   . Asthma   . Hyperlipidemia   . Depression   . Anxiety   . Pacemaker   . Stroke     Past Surgical History  Procedure Laterality Date  . Pacemaker insertion  01/04/2005    [place] PPM 1290 [phone]), GDT [phone] atrial lead and 4457 V lead all implanted in [place]  . Cholecystectomy    . Gallbladder surgery    . Tonsillectomy and adenoidectomy      VITAL SIGNS BP 164/98  Pulse 85  Ht 5\' 5"  (1.651 m)  Wt 106 lb 1.6 oz (48.127 kg)  BMI 17.66 kg/m2   Patient's Medications  New Prescriptions   No medications on file  Previous Medications   ALBUTEROL (PROVENTIL HFA;VENTOLIN HFA) 108 (90 BASE) MCG/ACT INHALER    Inhale 2 puffs into the lungs every 6 (six) hours as needed for wheezing.   AMLODIPINE (NORVASC) 10 MG TABLET    Take 10 mg by mouth at bedtime.   ASPIRIN EC 81 MG TABLET    Take 81 mg by mouth daily.   CHOLECALCIFEROL (VITAMIN D) 1000 UNITS TABLET    Take 1,000 Units by mouth daily.   FLUTICASONE  (FLOVENT HFA) 110 MCG/ACT INHALER    Inhale 1 puff into the lungs daily.   LOSARTAN (COZAAR) 100 MG TABLET    Take 100 mg by mouth daily.   MONTELUKAST (SINGULAIR) 10 MG TABLET    Take 10 mg by mouth at bedtime.   NITROGLYCERIN (NITROSTAT) 0.4 MG SL TABLET    Place 0.4 mg under the tongue every 5 (five) minutes as needed for chest pain.   SIMVASTATIN (ZOCOR) 40 MG TABLET    Take 40 mg by mouth daily.   TETRAHYDROZOLINE HCL (VISINE OP)    Apply 1 drop to eye 2 (two) times daily as needed (for dry eyes).   THEOPHYLLINE (UNIPHYL) 400 MG 24 HR TABLET    Take 400 mg by mouth daily.  Modified Medications   No medications on file  Discontinued Medications   No medications on file    SIGNIFICANT DIAGNOSTIC EXAMS  04-02-13: pelvic x-ray: mild to moderate diffuse osteoporosis; no acute fracture or lytic destructive lesion. Mild osteoarthritis at the sacroiliac disease.   04-02-13: left hip x-ray: mild diffuse osteopenia. No acute fracture; malalignment or lytic destructive disease. There is fracture deformity at  the left greater trochanter of indeterminate age possible acute or subacute.    LABS REVIEWED:   01-07-13: wbc 8.7;hgb 12.5; hct 41.0; mcv 91.3;plt 362;glucose 167; bun 25; creat 0.8; k+3.3; na++138;  Liver normal albumin 3.7; chol 160; ldl 88; trig 57    Review of Systems  Constitutional: Negative for malaise/fatigue.  Eyes: Negative for blurred vision.  Respiratory: Negative for cough and shortness of breath.   Cardiovascular: Negative for chest pain, palpitations and leg swelling.  Gastrointestinal: Negative for heartburn and abdominal pain.  Musculoskeletal: has left leg and knee pain   Skin: Negative.   Neurological: Negative for dizziness and headaches.  Psychiatric/Behavioral: negative for anxiety and depression    Physical Exam  Constitutional: She is oriented to person, place, and time. No distress.  frail  Neck: Neck supple. No JVD present.  Cardiovascular: Normal rate,  regular rhythm and intact distal pulses.   Respiratory: Effort normal and breath sounds normal. No respiratory distress. She has no wheezes.  GI: Soft. Bowel sounds are normal. She exhibits no distension. There is no tenderness.  Musculoskeletal: Normal range of motion. has tenderness to left hip joint has external rotation to left leg present.  Neurological: She is alert and oriented to person, place, and time.  Skin: Skin is warm and dry. She is not diaphoretic.  Psychiatric: She has a normal mood and affect.    ASSESSMENT/ PLAN:  1. Status post fall with left greater trochanteric fracture: will send to the ED for further evaluation and treatment.   2. Her blood pressure is elevated; this is more than likely related to her level of pain at this time.    Time spent with patient 45 minutes.

## 2013-04-02 NOTE — Consult Note (Signed)
ORTHOPAEDIC CONSULTATION  REQUESTING PHYSICIAN: Kathalene Frames, MD  Chief Complaint: L hip greater trochanteric fracture  HPI: Yvonne Buck is a 80 y.o. female who complains of  Mechanical fall onto the L side  Past Medical History  Diagnosis Date  . Tachycardia-bradycardia syndrome     s/p [employer] PPM implant in [place]  . Paroxysmal atrial fibrillation   . Hypertension   . Frequent falls   . Asthma   . Hyperlipidemia   . Depression   . Anxiety   . Pacemaker   . Stroke    Past Surgical History  Procedure Laterality Date  . Pacemaker insertion  01/04/2005    [place] PPM 1290 [phone]), GDT [phone] atrial lead and 4457 V lead all implanted in [place]  . Cholecystectomy    . Gallbladder surgery    . Tonsillectomy and adenoidectomy     History   Social History  . Marital Status: Widowed    Spouse Name: N/A    Number of Children: 2  . Years of Education: college   Occupational History  .      retired   Social History Main Topics  . Smoking status: Never Smoker   . Smokeless tobacco: Never Used  . Alcohol Use: No  . Drug Use: No  . Sexual Activity: No   Other Topics Concern  . None   Social History Narrative   Recently moved to [place] to live with her daughter.  Previously lived in [place].     Daughter . Yvonne Buck.     Patient has some college education.   Right handed   Caffeine- None   Family History  Problem Relation Age of Onset  . Diabetes Mother   . High blood pressure Mother   . Heart Problems Father    Allergies  Allergen Reactions  . Penicillins Other (See Comments)    Red spots ### Tolerated Rocephin 12/2012 ###   Prior to Admission medications   Medication Sig Start Date End Date Taking? Authorizing Provider  albuterol (PROVENTIL HFA;VENTOLIN HFA) 108 (90 BASE) MCG/ACT inhaler Inhale 2 puffs into the lungs every 6 (six) hours as needed for wheezing.    Historical Provider, MD  amLODipine  (NORVASC) 10 MG tablet Take 10 mg by mouth at bedtime.    Historical Provider, MD  aspirin EC 81 MG tablet Take 81 mg by mouth daily.    Historical Provider, MD  cholecalciferol (VITAMIN D) 1000 UNITS tablet Take 1,000 Units by mouth daily.    Historical Provider, MD  fluticasone (FLOVENT HFA) 110 MCG/ACT inhaler Inhale 1 puff into the lungs daily.    Historical Provider, MD  losartan (COZAAR) 100 MG tablet Take 100 mg by mouth daily.    Historical Provider, MD  montelukast (SINGULAIR) 10 MG tablet Take 10 mg by mouth at bedtime.    Historical Provider, MD  nitroGLYCERIN (NITROSTAT) 0.4 MG SL tablet Place 0.4 mg under the tongue every 5 (five) minutes as needed for chest pain.    Historical Provider, MD  simvastatin (ZOCOR) 40 MG tablet Take 40 mg by mouth daily.    Historical Provider, MD  Tetrahydrozoline HCl (VISINE OP) Apply 1 drop to eye 2 (two) times daily as needed (for dry eyes).    Historical Provider, MD  theophylline (UNIPHYL) 400 MG 24 hr tablet Take 400 mg by mouth daily.    Historical Provider, MD  traMADol (ULTRAM) 50 MG tablet Take 1 tablet (50  mg total) by mouth every 6 (six) hours as needed. 04/02/13   Gerlene Fee, NP   Dg Hip Complete Left  04/02/2013   CLINICAL DATA:  [place] last night.  Left hip pain.  EXAM: LEFT HIP - COMPLETE 2+ VIEW  COMPARISON:  None.  FINDINGS: There is subtle lucency at the greater trochanter, consistent with avulsion of the greater trochanter. The femoral head appears intact. No evidence for dislocation. The acetabulum appears intact. The right hip is unremarkable in appearance. Degenerative changes are seen in the lower lumbar spine.  Bowel gas pattern is nonobstructive. There is a large amount of stool throughout nondilated loops of colon however. There is dense atherosclerotic calcification of the femoral arteries bilaterally.  IMPRESSION: Minimally displaced fracture of the left greater trochanter.   Electronically Signed   By: Shon Hale M.D.   On:  04/02/2013 19:32    Positive ROS: All other systems have been reviewed and were otherwise negative with the exception of those mentioned in the HPI and as above.  Labs cbc  Recent Labs  04/02/13 1858  WBC 7.8  HGB 13.9  HCT 42.2  PLT 292    Labs inflam No results found for this basename: ESR, CRP,  in the last 72 hours  Labs coag  Recent Labs  04/02/13 1858  INR 0.93     Recent Labs  04/02/13 1858  NA 143  K 3.4*  CL 104  CO2 27  GLUCOSE 105*  BUN 15  CREATININE 0.61  CALCIUM 9.2    Physical Exam: Filed Vitals:   04/02/13 1935  BP: 163/82  Pulse: 77  Temp:   Resp: 16   General: Alert, no acute distress Cardiovascular: No pedal edema Respiratory: No cyanosis, no use of accessory musculature GI: No organomegaly, abdomen is soft and non-tender Skin: No lesions in the area of chief complaint Neurologic: Sensation intact distally Psychiatric: Patient is competent for consent with normal mood and affect Lymphatic: No axillary or cervical lymphadenopathy  MUSCULOSKELETAL:  Some pain with log roll SILT DP/SP/S/S/T nerve, 2+ DP, +TA/GS/EHL Compartments soft  Other extremities are atraumatic with painless ROM and NVI.  Assessment: Left greator trochanteric fracture  Plan: Non-operative managment Weight Bearing Status: TDWB LLE  PT/OT VTE px: SCD's and Chemical per the primary team, recommend continued aspirin but no orthopedic contraindication to a stronger anticoagulant.   Dispo when possible, likely SNF vs AIR  Follow up with me in 2-4wks for re-exam and xray.   Yvonne Buck, D, MD Cell [phone]   04/02/2013 8:15 PM

## 2013-04-02 NOTE — ED Notes (Signed)
Attempted to call report to 5N, was told will call back.

## 2013-04-02 NOTE — ED Notes (Signed)
Patient transported to XR. 

## 2013-04-03 DIAGNOSIS — F341 Dysthymic disorder: Secondary | ICD-10-CM

## 2013-04-03 LAB — BASIC METABOLIC PANEL
BUN: 11 mg/dL (ref 6–23)
CALCIUM: 9.1 mg/dL (ref 8.4–10.5)
CO2: 27 meq/L (ref 19–32)
Chloride: 107 mEq/L (ref 96–112)
Creatinine, Ser: 0.64 mg/dL (ref 0.50–1.10)
GFR calc Af Amer: >90 mL/min (ref >90)
GFR calc non Af Amer: 81 mL/min — ABNORMAL LOW (ref >90)
GLUCOSE: 108 mg/dL — AB (ref 70–99)
Potassium: 4.3 mEq/L (ref 3.7–5.3)
Sodium: 144 mEq/L (ref 137–147)

## 2013-04-03 MED ORDER — TRAMADOL HCL 50 MG PO TABS: 50.0000 mg | ORAL_TABLET | Freq: Four times a day (QID) | ORAL | Status: DC | PRN

## 2013-04-03 MED ORDER — ACETAMINOPHEN 325 MG PO TABS
650.0000 mg | ORAL_TABLET | Freq: Four times a day (QID) | ORAL | Status: DC | PRN
  Administered 2013-04-03: 650 mg via ORAL
  Filled 2013-04-03: qty 2

## 2013-04-03 MED ORDER — ATORVASTATIN CALCIUM 20 MG PO TABS
20.0000 mg | ORAL_TABLET | Freq: Every day | ORAL | Status: DC
  Filled 2013-04-03: qty 1

## 2013-04-03 MED ORDER — HYDROCODONE-ACETAMINOPHEN 5-325 MG PO TABS: 1.0000 | ORAL_TABLET | Freq: Four times a day (QID) | ORAL | Status: DC | PRN

## 2013-04-03 NOTE — Evaluation (Signed)
Physical Therapy Evaluation Patient Details Name: Yvonne Buck MRN: [id number] DOB: 1929/05/23 Today's Date: 04/03/2013 Time: 8413-2440 PT Time Calculation (min): 23 min  PT Assessment / Plan / Recommendation History of Present Illness  80 yo female fell last night at assisted living going to the bathroom mechanical fall, has been having left hip pain since and difficult to walk.  No recent illnesses.  Not so much pain when she moves hip just to bear weight. Pt with left minimally displaced greater trochanter fracture. Has mild dementia, reports that she moved here from [place] 4 mos ago and was independent living alone before that.   Clinical Impression  Pt at min A level with mobility and needs frequent cues for safety and to keep TDWB status. Pt will require supervision for all OOB mobility. She would like to return to [facility] but was in [place] per her report. If adequate supervision not available, will need SNF level care. PT will follow acutely.    PT Assessment  Patient needs continued PT services    Follow Up Recommendations  SNF;Supervision for mobility/OOB;Other (comment) (or return to ALF if they can provide adequate supervision)    Does the patient have the potential to tolerate intense rehabilitation      Barriers to Discharge Decreased caregiver support pt will need assistance for all OOB mobility    Equipment Recommendations  None recommended by PT    Recommendations for Other Services     Frequency Min 3X/week    Precautions / Restrictions Precautions Precautions: Fall Restrictions Weight Bearing Restrictions: Yes LLE Weight Bearing: Touchdown weight bearing   Pertinent Vitals/Pain 6/10 LLE, meds given prior to session      Mobility  Bed Mobility General bed mobility comments: pt up in chair, will practice next session Transfers Overall transfer level: Needs assistance Equipment used: Rolling walker (2 wheeled) Transfers: Sit to/from Stand Sit to  Stand: Min assist General transfer comment: min A to steady and to help pt understand TDWB status. Put my foot under her foot, she was trying to put too much wt through LLE but imrpoved with practice.  Ambulation/Gait Ambulation/Gait assistance: Min assist Ambulation Distance (Feet): 15 Feet Assistive device: Rolling walker (2 wheeled) Gait Pattern/deviations: Step-to pattern;Decreased stance time - left;Decreased step length - left;Trunk flexed Gait velocity: decreased Gait velocity interpretation: <1.8 ft/sec, indicative of risk for recurrent falls General Gait Details: pt needing education on taking wt through UE's to offset wt through LLE. Frequent vc's needed, also cued for sequencing of gait and upright posture    Exercises General Exercises - Lower Extremity Ankle Circles/Pumps: AROM;Both;10 reps;Seated Quad Sets: AROM;Both;10 reps;Seated Straight Leg Raises: AAROM;Left;5 reps;Seated   PT Diagnosis: Difficulty walking;Abnormality of gait;Acute pain  PT Problem List: Decreased strength;Decreased balance;Decreased mobility;Decreased cognition;Decreased knowledge of use of DME;Decreased safety awareness;Decreased knowledge of precautions;Pain PT Treatment Interventions: DME instruction;Gait training;Functional mobility training;Therapeutic activities;Therapeutic exercise;Balance training;Patient/family education     PT Goals(Current goals can be found in the care plan section) Acute Rehab PT Goals Patient Stated Goal: return to [facility] PT Goal Formulation: With patient Time For Goal Achievement: 04/17/13 Potential to Achieve Goals: Good  Visit Information  Last PT Received On: 04/03/13 Assistance Needed: +1 History of Present Illness: 80 yo female fell last night at assisted living going to the bathroom mechanical fall, has been having left hip pain since and difficult to walk.  No recent illnesses.  Not so much pain when she moves hip just to bear weight. Pt with left  minimally displaced greater trochanter fracture. Has mild dementia, reports that she moved here from [place] 4 mos ago and was independent living alone before that.        Prior [place] expects to be discharged to:: Assisted living Home Equipment: Walker - 2 wheels Additional Comments: pt was at [employer], she says she used a RW but it looked different from ours though she cannot tell me what was different Prior Function Comments: unsure of functional status, pt reports she could walk unassisted and that she fell in the bathroom Communication Communication: Prefers language other than English    Cognition  Cognition Arousal/Alertness: Awake/alert Behavior During Therapy: WFL for tasks assessed/performed Overall Cognitive Status: History of cognitive impairments - at baseline Memory: Decreased recall of precautions;Decreased short-term memory    Extremity/Trunk Assessment Upper Extremity Assessment Upper Extremity Assessment: Defer to OT evaluation;Overall [facility] for tasks assessed Lower Extremity Assessment Lower Extremity Assessment: LLE deficits/detail LLE Deficits / Details: knee and ankle WFL, hip flex 2/5 and painful Cervical / Trunk Assessment Cervical / Trunk Assessment: Kyphotic   Balance Balance Overall balance assessment: Needs assistance Standing balance support: Bilateral upper extremity supported;During functional activity Standing balance-Leahy Scale: Poor Standing balance comment: needs UE support to maintain standing with TDWB status  End of Session PT - End of Session Equipment Utilized During Treatment: Gait belt Activity Tolerance: Patient tolerated treatment well Patient left: in chair;with call bell/phone within reach Nurse Communication: Mobility status  GP Functional Assessment Tool Used: clinical judgement Functional Limitation: Mobility: Walking and moving around Mobility: Walking and Moving Around Current Status  (T0211): At least 20 percent but less than 40 percent impaired, limited or restricted Mobility: Walking and Moving Around Goal Status [id number]): At least 1 percent but less than 20 percent impaired, limited or restricted  Leighton Roach, PT  Acute Rehab Services  [phone]  Leighton Roach 04/03/2013, 12:17 PM

## 2013-04-03 NOTE — Discharge Instructions (Signed)
Partial Weight bearing on Left leg likely for 4wks  Continue Aspirin 81mg  daily  Mobilize with PT/OT

## 2013-04-03 NOTE — Discharge Summary (Addendum)
Physician Discharge Summary  Yvonne Buck DVV:[id number] DOB: Apr 21, 1929 DOA: 04/02/2013  PCP: Barbette Merino, MD  Admit date: 04/02/2013 Discharge date: 04/03/2013  Time spent: <43minutes  Recommendations for Outpatient Follow-up:  Follow-up Information   Follow up with Edmonia Lynch, D, MD. Schedule an appointment as soon as possible for a visit in 3 weeks.   Specialty:  Orthopedic Surgery   Contact information:   [address] [place] [address] [phone]       Please follow up. (Facility MD in 1-2days)        Discharge Diagnoses:  Principal Problem:   Fracture of greater trochanter of left femur Active Problems:   Hypertension   Fall   Atrial fibrillation   Left spastic hemiparesis   Hip fracture, left   Discharge Condition: Improved/stable  Diet recommendation: Low sodium  Filed Weights   04/02/13 1858  Weight: 63.504 kg (140 lb)    History of present illness:  Patient is  An 80 yo female fell last night at assisted living going to the bathroom mechanical fall, has been having left hip pain since and difficult to walk. No recent illnesses. Not so much pain when she moves hip just to bear weight. She was admitted for further evaluation and management.   Hospital Course:  Fracture of greater trochanter of left femur- -As discussed above upon admission orthopedics was consulted and Dr. Percell Miller saw the patient and recommended nonsurgical management. She was placed on narcotics for pain management as needed. On followup today she reports she is feeling better and per nursing only requiring Tylenol when necessary for pain. -PT OT was consulted and saw patient in the hospital recommends SNF -Dr. Percell Miller recommends left lower extremity touch down weightbearing with PT -Discussed her DVT prophylaxis per Dr. Percell Miller as well and he recommends to continue her aspirin 81 mg as previously. -She is to follow up with Dr. Percell Miller in 2-4 weeks for reexam on  x-ray, per Dr. Percell Miller okay to DC back to facility today.  Active Problems:  Hypertension  -Continue outpatient medication Fall  History of paroxysmal Atrial fibrillation  -Continue aspirin, she is on no rate control medication Left spastic hemiparesis   Procedures:  None  Consultations:  Orthopedics-Dr. Percell Miller  Discharge Exam: Filed Vitals:   04/03/13 0551  BP: 150/69  Pulse: 78  Temp: 97.9 F (36.6 C)  Resp: 18   Exam:  General: Sitting up in chair,alert & oriented x 3 In NAD Cardiovascular: RRR, nl S1 s2 Respiratory: CTAB Abdomen: soft +BS NT/ND, no masses palpable Extremities: No cyanosis and no edema    Discharge Instructions  Discharge Orders   Future Appointments Provider Department Dept Phone   07/29/2013 2:00 PM [facility] [place] Office [phone]   Future Orders Complete By Expires   Diet - low sodium heart healthy  As directed    Increase activity slowly  As directed        Medication List         albuterol 108 (90 BASE) MCG/ACT inhaler  Commonly known as:  PROVENTIL HFA;VENTOLIN HFA  Inhale 2 puffs into the lungs every 6 (six) hours as needed for wheezing.     amLODipine 10 MG tablet  Commonly known as:  NORVASC  Take 10 mg by mouth at bedtime.     aspirin EC 81 MG tablet  Take 81 mg by mouth daily.     cholecalciferol 1000 UNITS tablet  Commonly known as:  VITAMIN D  Take 1,000 Units by mouth daily.     fluticasone 110 MCG/ACT inhaler  Commonly known as:  FLOVENT HFA  Inhale 1 puff into the lungs daily.     HYDROcodone-acetaminophen 5-325 MG per tablet  Commonly known as:  NORCO/VICODIN  Take 1-2 tablets by mouth every 6 (six) hours as needed for moderate pain.     losartan 100 MG tablet  Commonly known as:  COZAAR  Take 100 mg by mouth daily.     montelukast 10 MG tablet  Commonly known as:  SINGULAIR  Take 10 mg by mouth at bedtime.     nitroGLYCERIN 0.4 MG SL tablet  Commonly known as:   NITROSTAT  Place 0.4 mg under the tongue every 5 (five) minutes as needed for chest pain.     simvastatin 40 MG tablet  Commonly known as:  ZOCOR  Take 40 mg by mouth daily.     theophylline 400 MG 24 hr tablet  Commonly known as:  UNIPHYL  Take 400 mg by mouth daily.     traMADol 50 MG tablet  Commonly known as:  ULTRAM  Take 1 tablet (50 mg total) by mouth every 6 (six) hours as needed.     VISINE OP  Apply 1 drop to eye 2 (two) times daily as needed (for dry eyes).       Allergies  Allergen Reactions  . Penicillins Other (See Comments)    Red spots ### Tolerated Rocephin 12/2012 ###       Follow-up Information   Follow up with MURPHY, TIMOTHY, D, MD. Schedule an appointment as soon as possible for a visit in 3 weeks.   Specialty:  Orthopedic Surgery   Contact information:   [address] [place] [address] [phone]       Please follow up. (Facility MD in 1-2days)        The results of significant diagnostics from this hospitalization (including imaging, microbiology, ancillary and laboratory) are listed below for reference.    Significant Diagnostic Studies: Dg Hip Complete Left  04/02/2013   CLINICAL DATA:  [place] last night.  Left hip pain.  EXAM: LEFT HIP - COMPLETE 2+ VIEW  COMPARISON:  None.  FINDINGS: There is subtle lucency at the greater trochanter, consistent with avulsion of the greater trochanter. The femoral head appears intact. No evidence for dislocation. The acetabulum appears intact. The right hip is unremarkable in appearance. Degenerative changes are seen in the lower lumbar spine.  Bowel gas pattern is nonobstructive. There is a large amount of stool throughout nondilated loops of colon however. There is dense atherosclerotic calcification of the femoral arteries bilaterally.  IMPRESSION: Minimally displaced fracture of the left greater trochanter.   Electronically Signed   By: Shon Hale M.D.   On: 04/02/2013 19:32     Microbiology: No results found for this or any previous visit (from the past 240 hour(s)).   Labs: Basic Metabolic Panel:  Recent Labs Lab 04/02/13 1858  NA 143  K 3.4*  CL 104  CO2 27  GLUCOSE 105*  BUN 15  CREATININE 0.61  CALCIUM 9.2   Liver Function Tests: No results found for this basename: AST, ALT, ALKPHOS, BILITOT, PROT, ALBUMIN,  in the last 168 hours No results found for this basename: LIPASE, AMYLASE,  in the last 168 hours No results found for this basename: AMMONIA,  in the last 168 hours CBC:  Recent Labs Lab 04/02/13 1858  WBC 7.8  NEUTROABS 4.5  HGB 13.9  HCT 42.2  MCV 87.2  PLT 292   Cardiac Enzymes: No results found for this basename: CKTOTAL, CKMB, CKMBINDEX, TROPONINI,  in the last 168 hours BNP: BNP (last 3 results)  Recent Labs  12/31/12 1717  PROBNP 309.1   CBG: No results found for this basename: GLUCAP,  in the last 168 hours     Signed:  Lailany Enoch C  [facility] 04/03/2013, 10:31 AM

## 2013-04-04 ENCOUNTER — Non-Acute Institutional Stay (SKILLED_NURSING_FACILITY): Payer: Medicare Other | Admitting: Adult Health

## 2013-04-04 ENCOUNTER — Other Ambulatory Visit: Payer: Self-pay | Admitting: *Deleted

## 2013-04-04 ENCOUNTER — Encounter: Payer: Self-pay | Admitting: Adult Health

## 2013-04-04 DIAGNOSIS — S72112A Displaced fracture of greater trochanter of left femur, initial encounter for closed fracture: Secondary | ICD-10-CM

## 2013-04-04 DIAGNOSIS — I10 Essential (primary) hypertension: Secondary | ICD-10-CM

## 2013-04-04 DIAGNOSIS — S72109A Unspecified trochanteric fracture of unspecified femur, initial encounter for closed fracture: Secondary | ICD-10-CM

## 2013-04-04 MED ORDER — TRAMADOL HCL 50 MG PO TABS: ORAL_TABLET | ORAL | Status: DC

## 2013-04-04 MED ORDER — HYDROCODONE-ACETAMINOPHEN 5-325 MG PO TABS: ORAL_TABLET | ORAL | Status: DC

## 2013-04-04 NOTE — Telephone Encounter (Signed)
[facility] 

## 2013-04-04 NOTE — Progress Notes (Signed)
Patient ID: Yvonne Buck, female   DOB: 1929/05/15, 80 y.o.   MRN: [id number]     Allergies  Allergen Reactions  . Penicillins Other (See Comments)    Red spots ### Tolerated Rocephin 12/2012 ###     Chief Complaint  Patient presents with  . Acute Visit    follow up fracture     HPI:  She had gone to the ED for her left hip fracture. After being examined in the hospital and consultations done; the decision was made for conservative treatment. Her pain is being managed at this time. She will need therapy for follow up and will need further monitoring.   Past Medical History  Diagnosis Date  . Tachycardia-bradycardia syndrome     s/p [employer] PPM implant in [place]  . Paroxysmal atrial fibrillation   . Hypertension   . Frequent falls   . Asthma   . Hyperlipidemia   . Depression   . Anxiety   . Pacemaker   . Stroke     Past Surgical History  Procedure Laterality Date  . Pacemaker insertion  01/04/2005    [place] PPM 1290 [phone]), GDT [phone] atrial lead and 4457 V lead all implanted in [place]  . Cholecystectomy    . Gallbladder surgery    . Tonsillectomy and adenoidectomy      VITAL SIGNS BP 155/70  Pulse 70  Ht 5\' 5"  (1.651 m)  Wt 106 lb 1.6 oz (48.127 kg)  BMI 17.66 kg/m2   Patient's Medications  New Prescriptions   No medications on file  Previous Medications   ALBUTEROL (PROVENTIL HFA;VENTOLIN HFA) 108 (90 BASE) MCG/ACT INHALER    Inhale 2 puffs into the lungs every 6 (six) hours as needed for wheezing.   AMLODIPINE (NORVASC) 10 MG TABLET    Take 10 mg by mouth at bedtime.   ASPIRIN EC 81 MG TABLET    Take 81 mg by mouth daily.   CHOLECALCIFEROL (VITAMIN D) 1000 UNITS TABLET    Take 1,000 Units by mouth daily.   FLUTICASONE (FLOVENT HFA) 110 MCG/ACT INHALER    Inhale 1 puff into the lungs daily.   HYDROCODONE-ACETAMINOPHEN (NORCO/VICODIN) 5-325 MG PER TABLET    Take one tablet by mouth every 6 hours as needed for mild pain; Take two  tablets by mouth every 6 hours as needed for moderate to severe pain   LOSARTAN (COZAAR) 100 MG TABLET    Take 100 mg by mouth daily.   MONTELUKAST (SINGULAIR) 10 MG TABLET    Take 10 mg by mouth at bedtime.   NITROGLYCERIN (NITROSTAT) 0.4 MG SL TABLET    Place 0.4 mg under the tongue every 5 (five) minutes as needed for chest pain.   SIMVASTATIN (ZOCOR) 40 MG TABLET    Take 40 mg by mouth daily.   TETRAHYDROZOLINE HCL (VISINE OP)    Apply 1 drop to eye 2 (two) times daily as needed (for dry eyes).   THEOPHYLLINE (UNIPHYL) 400 MG 24 HR TABLET    Take 400 mg by mouth daily.   TRAMADOL (ULTRAM) 50 MG TABLET    Take 1 tablet (50 mg total) by mouth every 6 (six) hours as needed.  Modified Medications   No medications on file  Discontinued Medications   No medications on file    SIGNIFICANT DIAGNOSTIC EXAMS  04-02-13: pelvic x-ray: mild to moderate diffuse osteoporosis; no acute fracture or lytic destructive lesion. Mild osteoarthritis at the sacroiliac disease.   04-02-13: left  hip x-ray: mild diffuse osteopenia. No acute fracture; malalignment or lytic destructive disease. There is fracture deformity at the left greater trochanter of indeterminate age possible acute or subacute.  04-02-13: left hip x-ray (hospital): Minimally displaced fracture of the left greater trochanter.    LABS REVIEWED:   01-07-13: wbc 8.7;hgb 12.5; hct 41.0; mcv 91.3;plt 362;glucose 167; bun 25; creat 0.8; k+3.3; na++138;  Liver normal albumin 3.7; chol 160; ldl 88; trig 57  04-02-13: wbc 7.8; hgb 13.9; hct 87.2; plt 292; glucose 105; bun 15; creat 0.61; k+3.4; na++ 143  2-111-15: glucose 108; bun 11; creat 0.64; k+4.3; na++144      Review of Systems  Constitutional: Negative for malaise/fatigue.  Eyes: Negative for blurred vision.  Respiratory: Negative for cough and shortness of breath.   Cardiovascular: Negative for chest pain, palpitations and leg swelling.  Gastrointestinal: Negative for heartburn and  abdominal pain.  Musculoskeletal: has left leg and knee pain is managed    Skin: Negative.   Neurological: Negative for dizziness and headaches.  Psychiatric/Behavioral: negative for anxiety and depression    Physical Exam  Constitutional: She is oriented to person, place, and time. No distress.  frail  Neck: Neck supple. No JVD present.  Cardiovascular: Normal rate, regular rhythm and intact distal pulses.   Respiratory: Effort normal and breath sounds normal. No respiratory distress. She has no wheezes.  GI: Soft. Bowel sounds are normal. She exhibits no distension. There is no tenderness.  Musculoskeletal: Normal range of motion. has tenderness to left hip joint has external rotation to left leg present.  Neurological: She is alert and oriented to person, place, and time.  Skin: Skin is warm and dry. She is not diaphoretic.  Psychiatric: She has a normal mood and affect.    ASSESSMENT/ PLAN:  1. Left trochanter fracture: will begin therapy as directed; will continue vicodin 5.325 mg every 6 hours as needed and ultram 50 mg every 6 hours as needed and  Will continue to monitor her status.   2. Hypertension; her blood pressure is improving with better management of her pain; will monitor her status.   Ok Edwards NP [facility] Adult Medicine  Contact [phone] Monday through Friday 8am- 5pm  After hours call [phone]

## 2013-04-12 ENCOUNTER — Non-Acute Institutional Stay (SKILLED_NURSING_FACILITY): Payer: Medicare Other | Admitting: Adult Health

## 2013-04-12 DIAGNOSIS — S72112A Displaced fracture of greater trochanter of left femur, initial encounter for closed fracture: Secondary | ICD-10-CM

## 2013-04-12 DIAGNOSIS — J45909 Unspecified asthma, uncomplicated: Secondary | ICD-10-CM

## 2013-04-12 DIAGNOSIS — E785 Hyperlipidemia, unspecified: Secondary | ICD-10-CM

## 2013-04-12 DIAGNOSIS — F418 Other specified anxiety disorders: Secondary | ICD-10-CM

## 2013-04-12 DIAGNOSIS — S72109A Unspecified trochanteric fracture of unspecified femur, initial encounter for closed fracture: Secondary | ICD-10-CM

## 2013-04-12 DIAGNOSIS — I10 Essential (primary) hypertension: Secondary | ICD-10-CM

## 2013-04-12 DIAGNOSIS — F341 Dysthymic disorder: Secondary | ICD-10-CM

## 2013-04-12 DIAGNOSIS — I4891 Unspecified atrial fibrillation: Secondary | ICD-10-CM

## 2013-04-14 ENCOUNTER — Encounter: Payer: Self-pay | Admitting: Adult Health

## 2013-04-14 NOTE — Progress Notes (Signed)
Patient ID: Yvonne Buck, female   DOB: 30-Jan-1930, 80 y.o.   MRN: [id number]     [facility]  Allergies  Allergen Reactions  . Penicillins Other (See Comments)    Red spots ### Tolerated Rocephin 12/2012 ###     Chief Complaint  Patient presents with  . Medical Managment of Chronic Issues    HPI:  She is being seen for the medical management of her chronic illnesses. she has had a left hip fracture this month; in which the decision was made for conservative treatment. She is not complaining of pain; states her medications are working. There are no concerns being voiced by the nursing staff at this time.   Past Medical History  Diagnosis Date  . Tachycardia-bradycardia syndrome     s/p [employer] PPM implant in [place]  . Paroxysmal atrial fibrillation   . Hypertension   . Frequent falls   . Asthma   . Hyperlipidemia   . Depression   . Anxiety   . Pacemaker   . Stroke     Past Surgical History  Procedure Laterality Date  . Pacemaker insertion  01/04/2005    [place] PPM 1290 [phone]), GDT [phone] atrial lead and 4457 V lead all implanted in [place]  . Cholecystectomy    . Gallbladder surgery    . Tonsillectomy and adenoidectomy      VITAL SIGNS BP 162/89  Pulse 75  Ht 5\' 5"  (1.651 m)  Wt 106 lb 1.6 oz (48.127 kg)  BMI 17.66 kg/m2   Patient's Medications  New Prescriptions   No medications on file  Previous Medications   ALBUTEROL (PROVENTIL HFA;VENTOLIN HFA) 108 (90 BASE) MCG/ACT INHALER    Inhale 2 puffs into the lungs every 6 (six) hours as needed for wheezing.   AMLODIPINE (NORVASC) 10 MG TABLET    Take 10 mg by mouth at bedtime.   ASPIRIN EC 81 MG TABLET    Take 81 mg by mouth daily.   CHOLECALCIFEROL (VITAMIN D) 1000 UNITS TABLET    Take 1,000 Units by mouth daily.   FLUTICASONE (FLOVENT HFA) 110 MCG/ACT INHALER    Inhale 1 puff into the lungs daily.   HYDROCODONE-ACETAMINOPHEN (NORCO/VICODIN) 5-325 MG PER TABLET    Take one tablet  by mouth every 6 hours as needed for pain   LOSARTAN (COZAAR) 100 MG TABLET    Take 100 mg by mouth daily.   MONTELUKAST (SINGULAIR) 10 MG TABLET    Take 10 mg by mouth at bedtime.   NITROGLYCERIN (NITROSTAT) 0.4 MG SL TABLET    Place 0.4 mg under the tongue every 5 (five) minutes as needed for chest pain.   SIMVASTATIN (ZOCOR) 40 MG TABLET    Take 40 mg by mouth daily.   TETRAHYDROZOLINE HCL (VISINE OP)    Apply 1 drop to eye 2 (two) times daily as needed (for dry eyes).   THEOPHYLLINE (UNIPHYL) 400 MG 24 HR TABLET    Take 400 mg by mouth daily.   TRAMADOL (ULTRAM) 50 MG TABLET    Take one tablet by mouth every six hours for 7 days for left hip and knee pain. Then begin as needed dosing.  Modified Medications   No medications on file  Discontinued Medications   No medications on file    SIGNIFICANT DIAGNOSTIC EXAMS  04-02-13: pelvic x-ray: mild to moderate diffuse osteoporosis; no acute fracture or lytic destructive lesion. Mild osteoarthritis at the sacroiliac disease.   04-02-13: left hip x-ray:  mild diffuse osteopenia. No acute fracture; malalignment or lytic destructive disease. There is fracture deformity at the left greater trochanter of indeterminate age possible acute or subacute.   04-02-13: left hip x-ray (hospital): Minimally displaced fracture of the left greater trochanter.    LABS REVIEWED:   01-07-13: wbc 8.7;hgb 12.5; hct 41.0; mcv 91.3;plt 362;glucose 167; bun 25; creat 0.8; k+3.3; na++138;  Liver normal albumin 3.7; chol 160; ldl 88; trig 57  04-02-13: wbc 7.8; hgb 13.9; hct 87.2; plt 292; glucose 105; bun 15; creat 0.61; k+3.4; na++ 143  2-111-15: glucose 108; bun 11; creat 0.64; k+4.3; na++144      Review of Systems  Constitutional: Negative for malaise/fatigue.  Eyes: Negative for blurred vision.  Respiratory: Negative for cough and shortness of breath.   Cardiovascular: Negative for chest pain, palpitations and leg swelling.  Gastrointestinal: Negative for  heartburn and abdominal pain.  Musculoskeletal: has left leg and knee pain is managed    Skin: Negative.   Neurological: Negative for dizziness and headaches.  Psychiatric/Behavioral: negative for anxiety and depression    Physical Exam  Constitutional: She is oriented to person, place, and time. No distress.  frail  Neck: Neck supple. No JVD present.  Cardiovascular: Normal rate, regular rhythm and intact distal pulses.   Respiratory: Effort normal and breath sounds normal. No respiratory distress. She has no wheezes.  GI: Soft. Bowel sounds are normal. She exhibits no distension. There is no tenderness.  Musculoskeletal: Normal range of motion. is able to move left leg.  Neurological: She is alert and oriented to person, place, and time.  Skin: Skin is warm and dry. She is not diaphoretic.  Psychiatric: She has a normal mood and affect.      ASSESSMENT/ PLAN:  1. Hypertension; is worse: will continue cozaar 100 mg daily and norvasc 10 mg daily; will add lopressor 25 mg twice daily and will monitor her status.   2. Dyslipidemia: will continue zocor 40 mg daily  3. Left trochanteric fracture: is stable will continue therapy as directed; will continue ultram 50 mg every 6 hours as needed and vicodin 5/325 mg every 6 hours as needed and will monitor her status.   4. asthma : is stable will continue theophylline er 400 mg daily; flovent 110 mcg inhaler daily singulair 10 mg daily; and albuterol 2 puffs every 6 hours as needed   5, afib: her heart rate is under control;  Will continue her asa 81 mg daily and will monitor her status.  6. Depression with anxiety: she is emotionally stable; she is presently not on medications; will not make changes at this time and will monitor her status.       Ok Edwards NP [facility] Adult Medicine  Contact [phone] Monday through Friday 8am- 5pm  After hours call [phone]

## 2013-05-06 ENCOUNTER — Encounter: Payer: Self-pay | Admitting: Adult Health

## 2013-05-06 ENCOUNTER — Non-Acute Institutional Stay (SKILLED_NURSING_FACILITY): Payer: Medicare Other | Admitting: Adult Health

## 2013-05-06 DIAGNOSIS — F418 Other specified anxiety disorders: Secondary | ICD-10-CM

## 2013-05-06 DIAGNOSIS — F341 Dysthymic disorder: Secondary | ICD-10-CM

## 2013-05-06 DIAGNOSIS — J45909 Unspecified asthma, uncomplicated: Secondary | ICD-10-CM

## 2013-05-06 DIAGNOSIS — S72112A Displaced fracture of greater trochanter of left femur, initial encounter for closed fracture: Secondary | ICD-10-CM

## 2013-05-06 DIAGNOSIS — I4891 Unspecified atrial fibrillation: Secondary | ICD-10-CM

## 2013-05-06 DIAGNOSIS — I10 Essential (primary) hypertension: Secondary | ICD-10-CM

## 2013-05-06 DIAGNOSIS — S72109A Unspecified trochanteric fracture of unspecified femur, initial encounter for closed fracture: Secondary | ICD-10-CM

## 2013-05-06 NOTE — Progress Notes (Signed)
Patient ID: Yvonne Buck, female   DOB: [birth year], 80 y.o.   MRN: [id number]     [facility]  Allergies  Allergen Reactions  . Penicillins Other (See Comments)    Red spots ### Tolerated Rocephin 12/2012 ###     Chief Complaint  Patient presents with  . Medical Managment of Chronic Issues    HPI:  She is being seen for the management of her chronic illnesses. Her blood pressure remains elevated and will require further treatment. She is without change overall in her status. There are no concerns being voiced by the nursing staff at this time. She is not voicing any complaints at this time.   Past Medical History  Diagnosis Date  . Tachycardia-bradycardia syndrome     s/p [employer] PPM implant in [place]  . Paroxysmal atrial fibrillation   . Hypertension   . Frequent falls   . Asthma   . Hyperlipidemia   . Depression   . Anxiety   . Pacemaker   . Stroke     Past Surgical History  Procedure Laterality Date  . Pacemaker insertion  01/04/2005    [place] PPM 1290 [phone]), GDT [phone] atrial lead and 4457 V lead all implanted in [place]  . Cholecystectomy    . Gallbladder surgery    . Tonsillectomy and adenoidectomy      VITAL SIGNS BP 186/70  Pulse 80  Ht 5\' 5"  (1.651 m)  Wt 107 lb 12.8 oz (48.898 kg)  BMI 17.94 kg/m2   Patient's Medications  New Prescriptions   No medications on file  Previous Medications   ALBUTEROL (PROVENTIL HFA;VENTOLIN HFA) 108 (90 BASE) MCG/ACT INHALER    Inhale 2 puffs into the lungs every 6 (six) hours as needed for wheezing.   AMLODIPINE (NORVASC) 10 MG TABLET    Take 10 mg by mouth at bedtime.   ASPIRIN EC 81 MG TABLET    Take 81 mg by mouth daily.   CHOLECALCIFEROL (VITAMIN D) 1000 UNITS TABLET    Take 1,000 Units by mouth daily.   FLUTICASONE (FLOVENT HFA) 110 MCG/ACT INHALER    Inhale 1 puff into the lungs daily.   HYDROCODONE-ACETAMINOPHEN (NORCO/VICODIN) 5-325 MG PER TABLET    Take one tablet by mouth  every 6 hours as needed for pain   LOSARTAN (COZAAR) 100 MG TABLET    Take 100 mg by mouth daily.   METOPROLOL TARTRATE (LOPRESSOR) 25 MG TABLET    Take 25 mg by mouth 2 (two) times daily.   MONTELUKAST (SINGULAIR) 10 MG TABLET    Take 10 mg by mouth at bedtime.   NITROGLYCERIN (NITROSTAT) 0.4 MG SL TABLET    Place 0.4 mg under the tongue every 5 (five) minutes as needed for chest pain.   SIMVASTATIN (ZOCOR) 40 MG TABLET    Take 40 mg by mouth daily.   THEOPHYLLINE (UNIPHYL) 400 MG 24 HR TABLET    Take 400 mg by mouth daily.   TRAMADOL (ULTRAM) 50 MG TABLET    Take one tablet by mouth every six hours for 7 days for left hip and knee pain. Then begin as needed dosing.  Modified Medications   No medications on file  Discontinued Medications   TETRAHYDROZOLINE HCL (VISINE OP)    Apply 1 drop to eye 2 (two) times daily as needed (for dry eyes).    SIGNIFICANT DIAGNOSTIC EXAMS  04-02-13: pelvic x-ray: mild to moderate diffuse osteoporosis; no acute fracture or lytic destructive lesion. Mild  osteoarthritis at the sacroiliac disease.   04-02-13: left hip x-ray: mild diffuse osteopenia. No acute fracture; malalignment or lytic destructive disease. There is fracture deformity at the left greater trochanter of indeterminate age possible acute or subacute.   04-02-13: left hip x-ray (hospital): Minimally displaced fracture of the left greater trochanter.    LABS REVIEWED:   01-07-13: wbc 8.7;hgb 12.5; hct 41.0; mcv 91.3;plt 362;glucose 167; bun 25; creat 0.8; k+3.3; na++138;  Liver normal albumin 3.7; chol 160; ldl 88; trig 57  04-02-13: wbc 7.8; hgb 13.9; hct 87.2; plt 292; glucose 105; bun 15; creat 0.61; k+3.4; na++ 143  2-111-15: glucose 108; bun 11; creat 0.64; k+4.3; na++144      Review of Systems  Constitutional: Negative for malaise/fatigue.  Eyes: Negative for blurred vision.  Respiratory: Negative for cough and shortness of breath.   Cardiovascular: Negative for chest pain,  palpitations and leg swelling.  Gastrointestinal: Negative for heartburn and abdominal pain.  Musculoskeletal: no complaints of pain present     Skin: Negative.   Neurological: Negative for dizziness and headaches.  Psychiatric/Behavioral: negative for anxiety and depression    Physical Exam  Constitutional: She is oriented to person, place, and time. No distress.  frail  Neck: Neck supple. No JVD present.  Cardiovascular: Normal rate, regular rhythm and intact distal pulses.   Respiratory: Effort normal and breath sounds normal. No respiratory distress. She has no wheezes.  GI: Soft. Bowel sounds are normal. She exhibits no distension. There is no tenderness.  Musculoskeletal: Normal range of motion. is able to move left leg.  Neurological: She is alert and oriented to person, place, and time.  Skin: Skin is warm and dry. She is not diaphoretic.  Psychiatric: She has a normal mood and affect.      ASSESSMENT/ PLAN:  1. Hypertension; is worse: will continue cozaar 100 mg daily and norvasc 10 mg daily; will increase  lopressor 25 mg twice daily and will monitor her status.   2. Dyslipidemia: will continue zocor 40 mg daily  3. Left trochanteric fracture: is stable  will continue ultram 50 mg every 6 hours as needed and vicodin 5/325 mg every 6 hours as needed and will monitor her status.   4. asthma : is stable will continue theophylline er 400 mg daily; flovent 110 mcg inhaler daily singulair 10 mg daily; and albuterol 2 puffs every 6 hours as needed   5, afib: her heart rate is under control;  Will continue her asa 81 mg daily and will monitor her status.  6. Depression with anxiety: she is emotionally stable; she is presently not on medications; will not make changes at this time and will monitor her status.          Ok Edwards NP [facility]  Contact [phone] Monday through Friday 8am- 5pm  After hours call [phone]

## 2013-05-09 MED ORDER — METOPROLOL TARTRATE 25 MG PO TABS
50.0000 mg | ORAL_TABLET | Freq: Two times a day (BID) | ORAL | Status: DC
Start: ? — End: 2014-03-24

## 2013-06-06 ENCOUNTER — Non-Acute Institutional Stay (SKILLED_NURSING_FACILITY): Payer: Medicare Other | Admitting: Adult Health

## 2013-06-06 DIAGNOSIS — F418 Other specified anxiety disorders: Secondary | ICD-10-CM

## 2013-06-06 DIAGNOSIS — S72112A Displaced fracture of greater trochanter of left femur, initial encounter for closed fracture: Secondary | ICD-10-CM

## 2013-06-06 DIAGNOSIS — F341 Dysthymic disorder: Secondary | ICD-10-CM

## 2013-06-06 DIAGNOSIS — J45909 Unspecified asthma, uncomplicated: Secondary | ICD-10-CM

## 2013-06-06 DIAGNOSIS — S72109A Unspecified trochanteric fracture of unspecified femur, initial encounter for closed fracture: Secondary | ICD-10-CM

## 2013-06-06 DIAGNOSIS — I4891 Unspecified atrial fibrillation: Secondary | ICD-10-CM

## 2013-06-06 DIAGNOSIS — I10 Essential (primary) hypertension: Secondary | ICD-10-CM

## 2013-06-13 ENCOUNTER — Non-Acute Institutional Stay (SKILLED_NURSING_FACILITY): Payer: Medicare Other | Admitting: Adult Health

## 2013-06-13 DIAGNOSIS — S329XXA Fracture of unspecified parts of lumbosacral spine and pelvis, initial encounter for closed fracture: Secondary | ICD-10-CM

## 2013-06-13 DIAGNOSIS — W19XXXA Unspecified fall, initial encounter: Secondary | ICD-10-CM

## 2013-06-13 DIAGNOSIS — M810 Age-related osteoporosis without current pathological fracture: Secondary | ICD-10-CM

## 2013-06-16 ENCOUNTER — Encounter: Payer: Self-pay | Admitting: Adult Health

## 2013-06-16 DIAGNOSIS — J45909 Unspecified asthma, uncomplicated: Secondary | ICD-10-CM | POA: Insufficient documentation

## 2013-06-16 NOTE — Progress Notes (Signed)
Patient ID: Yvonne Buck, female   DOB: 1930/01/11, 80 y.o.   MRN: [id number]    [facility]  Allergies  Allergen Reactions  . Penicillins Other (See Comments)    Red spots ### Tolerated Rocephin 12/2012 ###     Chief Complaint  Patient presents with  . Medical Management of Chronic Issues    HPI:  She is being seen for the management of her chronic illnesses. Overall her status is without change. There are no concerns being voiced by the nursing staff at this time. She is not voicing any concerns.    Past Medical History  Diagnosis Date  . Tachycardia-bradycardia syndrome     s/p [employer] PPM implant in [place]  . Paroxysmal atrial fibrillation   . Hypertension   . Frequent falls   . Asthma   . Hyperlipidemia   . Depression   . Anxiety   . Pacemaker   . Stroke     Past Surgical History  Procedure Laterality Date  . Pacemaker insertion  01/04/2005    [place] PPM 1290 [phone]), GDT [phone] atrial lead and 4457 V lead all implanted in [place]  . Cholecystectomy    . Gallbladder surgery    . Tonsillectomy and adenoidectomy      VITAL SIGNS BP 148/80  Pulse 70  Ht 5\' 5"  (1.651 m)  Wt 106 lb (48.081 kg)  BMI 17.64 kg/m2   Patient's Medications  New Prescriptions   No medications on file  Previous Medications   ALBUTEROL (PROVENTIL HFA;VENTOLIN HFA) 108 (90 BASE) MCG/ACT INHALER    Inhale 2 puffs into the lungs every 6 (six) hours as needed for wheezing.   AMLODIPINE (NORVASC) 10 MG TABLET    Take 10 mg by mouth at bedtime.   ASPIRIN EC 81 MG TABLET    Take 81 mg by mouth daily.   CHOLECALCIFEROL (VITAMIN D) 1000 UNITS TABLET    Take 1,000 Units by mouth daily.   FLUTICASONE (FLOVENT HFA) 110 MCG/ACT INHALER    Inhale 1 puff into the lungs daily.   HYDROCODONE-ACETAMINOPHEN (NORCO/VICODIN) 5-325 MG PER TABLET    Take one tablet by mouth every 6 hours as needed for pain   LOSARTAN (COZAAR) 100 MG TABLET    Take 100 mg by mouth daily.   METOPROLOL TARTRATE (LOPRESSOR) 25 MG TABLET    Take 2 tablets (50 mg total) by mouth 2 (two) times daily.   MONTELUKAST (SINGULAIR) 10 MG TABLET    Take 10 mg by mouth at bedtime.   NITROGLYCERIN (NITROSTAT) 0.4 MG SL TABLET    Place 0.4 mg under the tongue every 5 (five) minutes as needed for chest pain.   SIMVASTATIN (ZOCOR) 40 MG TABLET    Take 40 mg by mouth daily.   THEOPHYLLINE (UNIPHYL) 400 MG 24 HR TABLET    Take 400 mg by mouth daily.   TRAMADOL (ULTRAM) 50 MG TABLET    Take one tablet by mouth every six hours for 7 days for left hip and knee pain. Then begin as needed dosing.  Modified Medications   No medications on file  Discontinued Medications   No medications on file    SIGNIFICANT DIAGNOSTIC EXAMS   04-02-13: pelvic x-ray: mild to moderate diffuse osteoporosis; no acute fracture or lytic destructive lesion. Mild osteoarthritis at the sacroiliac disease.   04-02-13: left hip x-ray: mild diffuse osteopenia. No acute fracture; malalignment or lytic destructive disease. There is fracture deformity at the left greater trochanter  of indeterminate age possible acute or subacute.   04-02-13: left hip x-ray (hospital): Minimally displaced fracture of the left greater trochanter.    LABS REVIEWED:   01-07-13: wbc 8.7;hgb 12.5; hct 41.0; mcv 91.3;plt 362;glucose 167; bun 25; creat 0.8; k+3.3; na++138;  Liver normal albumin 3.7; chol 160; ldl 88; trig 57  04-02-13: wbc 7.8; hgb 13.9; hct 87.2; plt 292; glucose 105; bun 15; creat 0.61; k+3.4; na++ 143  2-111-15: glucose 108; bun 11; creat 0.64; k+4.3; na++144      Review of Systems  Constitutional: Negative for malaise/fatigue.  Eyes: Negative for blurred vision.  Respiratory: Negative for cough and shortness of breath.   Cardiovascular: Negative for chest pain, palpitations and leg swelling.  Gastrointestinal: Negative for heartburn and abdominal pain.  Musculoskeletal: no complaints of pain present     Skin: Negative.     Neurological: Negative for dizziness and headaches.  Psychiatric/Behavioral: negative for anxiety and depression    Physical Exam  Constitutional: She is oriented to person, place, and time. No distress.  frail  Neck: Neck supple. No JVD present.  Cardiovascular: Normal rate, regular rhythm and intact distal pulses.   Respiratory: Effort normal and breath sounds normal. No respiratory distress. She has no wheezes.  GI: Soft. Bowel sounds are normal. She exhibits no distension. There is no tenderness.  Musculoskeletal: Normal range of motion. is able to move left leg.  Neurological: She is alert and oriented to person, place, and time.  Skin: Skin is warm and dry. She is not diaphoretic.  Psychiatric: She has a normal mood and affect.      ASSESSMENT/ PLAN:  1. Hypertension; is worse: will continue cozaar 100 mg daily and norvasc 10 mg daily; will increase  lopressor 25 mg twice daily and will monitor her status.   2. Dyslipidemia: will continue zocor 40 mg daily  3. Left trochanteric fracture: is stable  will continue ultram 50 mg every 6 hours as needed and vicodin 5/325 mg every 6 hours as needed and will monitor her status.   4. asthma : is stable will continue theophylline er 400 mg daily; flovent 110 mcg inhaler daily singulair 10 mg daily; and albuterol 2 puffs every 6 hours as needed   5, afib: her heart rate is under control;  Will continue her asa 81 mg daily and will monitor her status.  6. Depression with anxiety: she is emotionally stable; she is presently not on medications; will not make changes at this time and will monitor her status.         Ok Edwards NP [facility]  Contact [phone] Monday through Friday 8am- 5pm  After hours call [phone]

## 2013-06-17 ENCOUNTER — Other Ambulatory Visit: Payer: Self-pay | Admitting: *Deleted

## 2013-06-17 MED ORDER — HYDROCODONE-ACETAMINOPHEN 5-325 MG PO TABS
ORAL_TABLET | ORAL | Status: DC
Start: 2013-06-17 — End: 2014-06-25

## 2013-06-17 NOTE — Telephone Encounter (Signed)
[facility] 

## 2013-06-23 ENCOUNTER — Encounter: Payer: Self-pay | Admitting: Adult Health

## 2013-06-23 DIAGNOSIS — M810 Age-related osteoporosis without current pathological fracture: Secondary | ICD-10-CM | POA: Insufficient documentation

## 2013-06-23 DIAGNOSIS — S329XXA Fracture of unspecified parts of lumbosacral spine and pelvis, initial encounter for closed fracture: Secondary | ICD-10-CM

## 2013-06-23 HISTORY — DX: Age-related osteoporosis without current pathological fracture: M81.0

## 2013-06-23 HISTORY — DX: Fracture of unspecified parts of lumbosacral spine and pelvis, initial encounter for closed fracture: S32.9XXA

## 2013-06-23 MED ORDER — ASPIRIN EC 325 MG PO TBEC: 325.0000 mg | DELAYED_RELEASE_TABLET | Freq: Every day | ORAL | Status: DC

## 2013-06-23 MED ORDER — CALCIUM CARBONATE 600 MG PO TABS: 600.0000 mg | ORAL_TABLET | Freq: Two times a day (BID) | ORAL | Status: DC

## 2013-06-23 MED ORDER — VITAMIN D 50 MCG (2000 UT) PO CAPS: 2000.0000 [IU] | ORAL_CAPSULE | Freq: Every day | ORAL | Status: DC

## 2013-06-23 MED ORDER — TERIPARATIDE (RECOMBINANT) 600 MCG/2.4ML ~~LOC~~ SOLN: 20.0000 ug | Freq: Every day | SUBCUTANEOUS | Status: DC

## 2013-06-23 NOTE — Progress Notes (Signed)
Patient ID: Yvonne Buck, female   DOB: Dec 28, 1929, 80 y.o.   MRN: [id number]     [facility]  Allergies  Allergen Reactions  . Penicillins Other (See Comments)    Red spots ### Tolerated Rocephin 12/2012 ###     Chief Complaint  Patient presents with  . Acute Visit    follow up x-rays     HPI:  She has had a fall in which she has suffered another fall. She has had a pelvic fracture. She also has had a previous minimal left hip fracture as well. She will require forteo treatment at this time in order better prevent further fractures.    Past Medical History  Diagnosis Date  . Tachycardia-bradycardia syndrome     s/p [employer] PPM implant in [place]  . Paroxysmal atrial fibrillation   . Hypertension   . Frequent falls   . Asthma   . Hyperlipidemia   . Depression   . Anxiety   . Pacemaker   . Stroke     Past Surgical History  Procedure Laterality Date  . Pacemaker insertion  01/04/2005    [place] PPM 1290 [phone]), GDT [phone] atrial lead and 4457 V lead all implanted in [place]  . Cholecystectomy    . Gallbladder surgery    . Tonsillectomy and adenoidectomy      VITAL SIGNS BP 148/80  Pulse 78  Ht 5\' 5"  (1.651 m)  Wt 106 lb (48.081 kg)  BMI 17.64 kg/m2   Patient's Medications  New Prescriptions   No medications on file  Previous Medications   ALBUTEROL (PROVENTIL HFA;VENTOLIN HFA) 108 (90 BASE) MCG/ACT INHALER    Inhale 2 puffs into the lungs every 6 (six) hours as needed for wheezing.   AMLODIPINE (NORVASC) 10 MG TABLET    Take 10 mg by mouth at bedtime.   ASPIRIN EC 81 MG TABLET    Take 81 mg by mouth daily.   CHOLECALCIFEROL (VITAMIN D) 1000 UNITS TABLET    Take 1,000 Units by mouth daily.   FLUTICASONE (FLOVENT HFA) 110 MCG/ACT INHALER    Inhale 1 puff into the lungs daily.   HYDROCODONE-ACETAMINOPHEN (NORCO/VICODIN) 5-325 MG PER TABLET    Take one tablet by mouth every 6 hours as needed for mild pain; Take two tablets by mouth  every 6 hours as needed for moderate to severe pain   LOSARTAN (COZAAR) 100 MG TABLET    Take 100 mg by mouth daily.   METOPROLOL TARTRATE (LOPRESSOR) 25 MG TABLET    Take 2 tablets (50 mg total) by mouth 2 (two) times daily.   MONTELUKAST (SINGULAIR) 10 MG TABLET    Take 10 mg by mouth at bedtime.   NITROGLYCERIN (NITROSTAT) 0.4 MG SL TABLET    Place 0.4 mg under the tongue every 5 (five) minutes as needed for chest pain.   SIMVASTATIN (ZOCOR) 40 MG TABLET    Take 40 mg by mouth daily.   THEOPHYLLINE (UNIPHYL) 400 MG 24 HR TABLET    Take 400 mg by mouth daily.   TRAMADOL (ULTRAM) 50 MG TABLET    Take one tablet by mouth every six hours for 7 days for left hip and knee pain. Then begin as needed dosing.  Modified Medications   No medications on file  Discontinued Medications   No medications on file    SIGNIFICANT DIAGNOSTIC EXAMS  04-02-13: pelvic x-ray: mild to moderate diffuse osteoporosis; no acute fracture or lytic destructive lesion. Mild osteoarthritis at  the sacroiliac disease.   04-02-13: left hip x-ray: mild diffuse osteopenia. No acute fracture; malalignment or lytic destructive disease. There is fracture deformity at the left greater trochanter of indeterminate age possible acute or subacute.   04-02-13: left hip x-ray (hospital): Minimally displaced fracture of the left greater trochanter.  06-13-13: pelvic x-ray: 1. Mild to moderate diffuse osteopenia. 2. No lytic destructive lesion. 3. Nondisplaced acute or subacute fracture at the left inferior pubic ramus which has developed in the interval.   06-13-13: right hip x-ray: 1. No acute fracture or malalignment or lytic destructive lesion. 2. No significant joint space narrowing 3. Mild degenerative irregularity at the right greater trochanter  06-13-13: left hip x-ray; 1. Mild diffuse osteopenia 2. No subluxation, dislocation or lytic destructive lesion. 3. Acute to subacute nondisplaced fracture at the left inferior pubic ramus. 4.  A 2.0 cm old fracture fragment lateral to the left femoral neck   06-13-13: right femur x-ray: 1. Mild diffuse osteopenia 2. No acute fracture or lytic destructive lesion  06-13-13: left knee x-ray: 1. No acute fracture malalignment or lytic destructive lesion 2. No significant joint space narrowing  06-13-13; left tib/fib x-ray; 1. Mild diffuse osteopenia 2. No acute fracture or lytic destructive lesion 3. No radiographic evidence for osteomyelitis.   06-13-13: right tib/fib x-ray: mild diffuse osteopenia 2. No acute fracture or lytic destructive lesion. 3. No radiographic evidence for osteomyelitis  06-13-13; right knee x-ray; no acute fracture malalignment or lytic destructive lesion. No significant joint narrowing.     LABS REVIEWED:   01-07-13: wbc 8.7;hgb 12.5; hct 41.0; mcv 91.3;plt 362;glucose 167; bun 25; creat 0.8; k+3.3; na++138;  Liver normal albumin 3.7; chol 160; ldl 88; trig 57  04-02-13: wbc 7.8; hgb 13.9; hct 87.2; plt 292; glucose 105; bun 15; creat 0.61; k+3.4; na++ 143  2-111-15: glucose 108; bun 11; creat 0.64; k+4.3; na++144      Review of Systems  Constitutional: Negative for malaise/fatigue.  Eyes: Negative for blurred vision.  Respiratory: Negative for cough and shortness of breath.   Cardiovascular: Negative for chest pain, palpitations and leg swelling.  Gastrointestinal: Negative for heartburn and abdominal pain.  Musculoskeletal: no complaints of pain present     Skin: Negative.   Neurological: Negative for dizziness and headaches.  Psychiatric/Behavioral: negative for anxiety and depression    Physical Exam  Constitutional: She is oriented to person, place, and time. No distress.  frail  Neck: Neck supple. No JVD present.  Cardiovascular: Normal rate, regular rhythm and intact distal pulses.   Respiratory: Effort normal and breath sounds normal. No respiratory distress. She has no wheezes.  GI: Soft. Bowel sounds are normal. She exhibits no distension.  There is no tenderness.  Musculoskeletal: Normal range of motion. is able to move left leg.  Neurological: She is alert and oriented to person, place, and time.  Skin: Skin is warm and dry. She is not diaphoretic.  Psychiatric: She has a normal mood and affect.       ASSESSMENT/ PLAN:  1. Pelvic fracture 2. Osteoporosis Will begin forteo daily for 2 years; will begin ca++600 mg twice daily and vit d 2000 units daily. Will check bmp and pth next draw. Will monitor her status.   Time spent with patient 45 minutes.

## 2013-07-01 ENCOUNTER — Non-Acute Institutional Stay (SKILLED_NURSING_FACILITY): Payer: Medicare Other | Admitting: Adult Health

## 2013-07-01 ENCOUNTER — Encounter: Payer: Self-pay | Admitting: Adult Health

## 2013-07-01 DIAGNOSIS — J45909 Unspecified asthma, uncomplicated: Secondary | ICD-10-CM

## 2013-07-01 DIAGNOSIS — M810 Age-related osteoporosis without current pathological fracture: Secondary | ICD-10-CM

## 2013-07-01 DIAGNOSIS — S72109A Unspecified trochanteric fracture of unspecified femur, initial encounter for closed fracture: Secondary | ICD-10-CM

## 2013-07-01 DIAGNOSIS — S329XXA Fracture of unspecified parts of lumbosacral spine and pelvis, initial encounter for closed fracture: Secondary | ICD-10-CM

## 2013-07-01 DIAGNOSIS — F341 Dysthymic disorder: Secondary | ICD-10-CM

## 2013-07-01 DIAGNOSIS — I4891 Unspecified atrial fibrillation: Secondary | ICD-10-CM

## 2013-07-01 DIAGNOSIS — S72112A Displaced fracture of greater trochanter of left femur, initial encounter for closed fracture: Secondary | ICD-10-CM

## 2013-07-01 DIAGNOSIS — I10 Essential (primary) hypertension: Secondary | ICD-10-CM

## 2013-07-01 DIAGNOSIS — F418 Other specified anxiety disorders: Secondary | ICD-10-CM

## 2013-07-01 DIAGNOSIS — W19XXXA Unspecified fall, initial encounter: Secondary | ICD-10-CM

## 2013-07-01 DIAGNOSIS — E785 Hyperlipidemia, unspecified: Secondary | ICD-10-CM

## 2013-07-01 NOTE — Progress Notes (Signed)
Patient ID: Yvonne Buck, female   DOB: [birth year], 80 y.o.   MRN: [id number]     [facility]  Allergies  Allergen Reactions  . Penicillins Other (See Comments)    Red spots ### Tolerated Rocephin 12/2012 ###     Chief Complaint  Patient presents with  . Medical Management of Chronic Issues    HPI:  She is being seen for the management of her chronic illnesses. She has been treated for an uti this past month without complications. She has suffered a pelvic fracture from a fall and has been started on forteo therapy. There are no concerns being voiced by the nursing staff at this time. She states that she is feeling better. She is ambulating with restorative therapy.    Past Medical History  Diagnosis Date  . Tachycardia-bradycardia syndrome     s/p [employer] PPM implant in [place]  . Paroxysmal atrial fibrillation   . Hypertension   . Frequent falls   . Asthma   . Hyperlipidemia   . Depression   . Anxiety   . Pacemaker   . Stroke     Past Surgical History  Procedure Laterality Date  . Pacemaker insertion  01/04/2005    [place] PPM 1290 [phone]), GDT [phone] atrial lead and 4457 V lead all implanted in [place]  . Cholecystectomy    . Gallbladder surgery    . Tonsillectomy and adenoidectomy      VITAL SIGNS BP 130/80  Pulse 70  Ht 5\' 5"  (1.651 m)  Wt 104 lb 3.2 oz (47.265 kg)  BMI 17.34 kg/m2   Patient's Medications  New Prescriptions   No medications on file  Previous Medications   ALBUTEROL (PROVENTIL HFA;VENTOLIN HFA) 108 (90 BASE) MCG/ACT INHALER    Inhale 2 puffs into the lungs every 6 (six) hours as needed for wheezing.   AMLODIPINE (NORVASC) 10 MG TABLET    Take 10 mg by mouth at bedtime.   ASPIRIN EC 325 MG TABLET    Take 1 tablet (325 mg total) by mouth daily.   CALCIUM CARBONATE (CALCIUM 600) 600 MG TABS TABLET    Take 1 tablet (600 mg total) by mouth 2 (two) times daily with a meal.   CHOLECALCIFEROL (VITAMIN D) 2000 UNITS  CAPS    Take 1 capsule (2,000 Units total) by mouth daily.   FLUTICASONE (FLOVENT HFA) 110 MCG/ACT INHALER    Inhale 1 puff into the lungs daily.   HYDROCODONE-ACETAMINOPHEN (NORCO/VICODIN) 5-325 MG PER TABLET    Take one tablet by mouth every 6 hours as needed for mild pain; Take two tablets by mouth every 6 hours as needed for moderate to severe pain   LOSARTAN (COZAAR) 100 MG TABLET    Take 100 mg by mouth daily.   METOPROLOL TARTRATE (LOPRESSOR) 25 MG TABLET    Take 2 tablets (50 mg total) by mouth 2 (two) times daily.   MONTELUKAST (SINGULAIR) 10 MG TABLET    Take 10 mg by mouth at bedtime.   NITROGLYCERIN (NITROSTAT) 0.4 MG SL TABLET    Place 0.4 mg under the tongue every 5 (five) minutes as needed for chest pain.   SIMVASTATIN (ZOCOR) 40 MG TABLET    Take 40 mg by mouth daily.   TERIPARATIDE, RECOMBINANT, (FORTEO) 600 MCG/2.4ML SOLN    Inject 0.08 mLs (20 mcg total) into the skin daily. From 06-14-13 for 2 years   THEOPHYLLINE (UNIPHYL) 400 MG 24 HR TABLET    Take 400  mg by mouth daily.  Modified Medications   Modified Medication Previous Medication   TRAMADOL (ULTRAM) 50 MG TABLET traMADol (ULTRAM) 50 MG tablet      Take 50 mg by mouth every 6 (six) hours as needed.    Take one tablet by mouth every six hours for 7 days for left hip and knee pain. Then begin as needed dosing.  Discontinued Medications   No medications on file    SIGNIFICANT DIAGNOSTIC EXAMS  04-02-13: pelvic x-ray: mild to moderate diffuse osteoporosis; no acute fracture or lytic destructive lesion. Mild osteoarthritis at the sacroiliac disease.   04-02-13: left hip x-ray: mild diffuse osteopenia. No acute fracture; malalignment or lytic destructive disease. There is fracture deformity at the left greater trochanter of indeterminate age possible acute or subacute.   04-02-13: left hip x-ray (hospital): Minimally displaced fracture of the left greater trochanter.  06-13-13: pelvic x-ray: 1. Mild to moderate diffuse  osteopenia. 2. No lytic destructive lesion. 3. Nondisplaced acute or subacute fracture at the left inferior pubic ramus which has developed in the interval.   06-13-13: right hip x-ray: 1. No acute fracture or malalignment or lytic destructive lesion. 2. No significant joint space narrowing 3. Mild degenerative irregularity at the right greater trochanter  06-13-13: left hip x-ray; 1. Mild diffuse osteopenia 2. No subluxation, dislocation or lytic destructive lesion. 3. Acute to subacute nondisplaced fracture at the left inferior pubic ramus. 4. A 2.0 cm old fracture fragment lateral to the left femoral neck   06-13-13: right femur x-ray: 1. Mild diffuse osteopenia 2. No acute fracture or lytic destructive lesion  06-13-13: left knee x-ray: 1. No acute fracture malalignment or lytic destructive lesion 2. No significant joint space narrowing  06-13-13; left tib/fib x-ray; 1. Mild diffuse osteopenia 2. No acute fracture or lytic destructive lesion 3. No radiographic evidence for osteomyelitis.   06-13-13: right tib/fib x-ray: mild diffuse osteopenia 2. No acute fracture or lytic destructive lesion. 3. No radiographic evidence for osteomyelitis  06-13-13; right knee x-ray; no acute fracture malalignment or lytic destructive lesion. No significant joint narrowing.     LABS REVIEWED:   01-07-13: wbc 8.7;hgb 12.5; hct 41.0; mcv 91.3;plt 362;glucose 167; bun 25; creat 0.8; k+3.3; na++138;  Liver normal albumin 3.7; chol 160; ldl 88; trig 57  04-02-13: wbc 7.8; hgb 13.9; hct 87.2; plt 292; glucose 105; bun 15; creat 0.61; k+3.4; na++ 143  04-03-13: glucose 108; bun 11; creat 0.64; k+4.3; na++144  06-17-13: glucose 102; bun 15; creat 0.7; k+3.5; na++144 06-21-13: urine culture: e-coli: macrobid      Review of Systems  Constitutional: Negative for malaise/fatigue.  Eyes: Negative for blurred vision.  Respiratory: Negative for cough and shortness of breath.   Cardiovascular: Negative for chest pain,  palpitations and leg swelling.  Gastrointestinal: Negative for heartburn and abdominal pain.  Musculoskeletal: no complaints of pain present     Skin: Negative.   Neurological: Negative for dizziness and headaches.  Psychiatric/Behavioral: negative for anxiety and depression      Physical Exam  Constitutional: She is oriented to person, place, and time. No distress.  frail  Neck: Neck supple. No JVD present.  Cardiovascular: Normal rate, regular rhythm and intact distal pulses.   Respiratory: Effort normal and breath sounds normal. No respiratory distress. She has no wheezes.  GI: Soft. Bowel sounds are normal. She exhibits no distension. There is no tenderness.  Musculoskeletal: Normal range of motion. is able to move left leg.  Neurological: She is alert  and oriented to person, place, and time.  Skin: Skin is warm and dry. She is not diaphoretic.  Psychiatric: She has a normal mood and affect.     ASSESSMENT/ PLAN:  1. Hypertension: will continue norvasc 10 mg daily; cozaar 100 mg daily; lopressor 50 mg twice daily   2. Afib: her heart rate is stable is status post pace maker insertion; is not a candidate for coumadin therapy. Will continue asa 325 mg daily   3. Osteoporosis: is status post multiple fractures of left trochanter fracture; and pelvic fracture; will continue forteo daily for total of 2 years; will continue ca++ 600 mg twice daily and vit d 2000 units daily; will continue vitcodin 5/325 mg 1-2 tabs every 6 hours as needed for pain and ultram 50 mge very 6 hours as needed for pain and will monitor    4. Chronic asthma: she is presently stable; will continue flovent inhaler daily; will continue singulair 10 mg daily; will continue albuterol 2 puffs every 6 hours as needed will continue theophylline er 400 mg daily  Will monitor   5. Dyslipidemia: will continue zocor 40 mg daily   6. Depression with anxiety: she is presently not on medications is presently stable will  continue to monitor her status.     Ok Edwards NP [facility]  Contact [phone] Monday through Friday 8am- 5pm  After hours call [phone]

## 2013-07-02 ENCOUNTER — Non-Acute Institutional Stay (SKILLED_NURSING_FACILITY): Payer: Medicare Other | Admitting: Adult Health

## 2013-07-02 DIAGNOSIS — I10 Essential (primary) hypertension: Secondary | ICD-10-CM

## 2013-07-07 ENCOUNTER — Encounter: Payer: Self-pay | Admitting: Adult Health

## 2013-07-07 DIAGNOSIS — I10 Essential (primary) hypertension: Secondary | ICD-10-CM | POA: Insufficient documentation

## 2013-07-07 MED ORDER — HYDROCHLOROTHIAZIDE 25 MG PO TABS: 25.0000 mg | ORAL_TABLET | Freq: Every day | ORAL | Status: DC

## 2013-07-07 NOTE — Progress Notes (Signed)
Patient ID: Yvonne Buck, female   DOB: 1929-05-01, 80 y.o.   MRN: [id number]     [facility]  Allergies  Allergen Reactions  . Penicillins Other (See Comments)    Red spots ### Tolerated Rocephin 12/2012 ###     Chief Complaint  Patient presents with  . Acute Visit    hypertension    HPI:  Her blood pressure is not being well managed. Her readings are elevated around 190/90. She is not complaining of chest pain; shortness of breath. No complaints of visual changes. She is taking her medications on a regular basis.    Past Medical History  Diagnosis Date  . Tachycardia-bradycardia syndrome     s/p [employer] PPM implant in [place]  . Paroxysmal atrial fibrillation   . Hypertension   . Frequent falls   . Asthma   . Hyperlipidemia   . Depression   . Anxiety   . Pacemaker   . Stroke     Past Surgical History  Procedure Laterality Date  . Pacemaker insertion  01/04/2005    [place] PPM 1290 [phone]), GDT [id number] atrial lead and 4457 V lead all implanted in [place]  . Cholecystectomy    . Gallbladder surgery    . Tonsillectomy and adenoidectomy      VITAL SIGNS BP 190/92  Pulse 63  Ht 5\' 5"  (1.651 m)  Wt 104 lb 3.2 oz (47.265 kg)  BMI 17.34 kg/m2   Patient's Medications  New Prescriptions   No medications on file  Previous Medications   ALBUTEROL (PROVENTIL HFA;VENTOLIN HFA) 108 (90 BASE) MCG/ACT INHALER    Inhale 2 puffs into the lungs every 6 (six) hours as needed for wheezing.   AMLODIPINE (NORVASC) 10 MG TABLET    Take 10 mg by mouth at bedtime.   ASPIRIN EC 325 MG TABLET    Take 1 tablet (325 mg total) by mouth daily.   CALCIUM CARBONATE (CALCIUM 600) 600 MG TABS TABLET    Take 1 tablet (600 mg total) by mouth 2 (two) times daily with a meal.   CHOLECALCIFEROL (VITAMIN D) 2000 UNITS CAPS    Take 1 capsule (2,000 Units total) by mouth daily.   FLUTICASONE (FLOVENT HFA) 110 MCG/ACT INHALER    Inhale 1 puff into the lungs daily.   HYDROCODONE-ACETAMINOPHEN (NORCO/VICODIN) 5-325 MG PER TABLET    Take one tablet by mouth every 6 hours as needed for mild pain; Take two tablets by mouth every 6 hours as needed for moderate to severe pain   LOSARTAN (COZAAR) 100 MG TABLET    Take 100 mg by mouth daily.   METOPROLOL TARTRATE (LOPRESSOR) 25 MG TABLET    Take 2 tablets (50 mg total) by mouth 2 (two) times daily.   MONTELUKAST (SINGULAIR) 10 MG TABLET    Take 10 mg by mouth at bedtime.   NITROGLYCERIN (NITROSTAT) 0.4 MG SL TABLET    Place 0.4 mg under the tongue every 5 (five) minutes as needed for chest pain.   SIMVASTATIN (ZOCOR) 40 MG TABLET    Take 40 mg by mouth daily.   TERIPARATIDE, RECOMBINANT, (FORTEO) 600 MCG/2.4ML SOLN    Inject 0.08 mLs (20 mcg total) into the skin daily. From 06-14-13 for 2 years   THEOPHYLLINE (UNIPHYL) 400 MG 24 HR TABLET    Take 400 mg by mouth daily.   TRAMADOL (ULTRAM) 50 MG TABLET    Take 50 mg by mouth every 6 (six) hours as needed.  Modified  Medications   No medications on file  Discontinued Medications   No medications on file    SIGNIFICANT DIAGNOSTIC EXAMS  04-02-13: pelvic x-ray: mild to moderate diffuse osteoporosis; no acute fracture or lytic destructive lesion. Mild osteoarthritis at the sacroiliac disease.   04-02-13: left hip x-ray: mild diffuse osteopenia. No acute fracture; malalignment or lytic destructive disease. There is fracture deformity at the left greater trochanter of indeterminate age possible acute or subacute.   04-02-13: left hip x-ray (hospital): Minimally displaced fracture of the left greater trochanter.  06-13-13: pelvic x-ray: 1. Mild to moderate diffuse osteopenia. 2. No lytic destructive lesion. 3. Nondisplaced acute or subacute fracture at the left inferior pubic ramus which has developed in the interval.   06-13-13: right hip x-ray: 1. No acute fracture or malalignment or lytic destructive lesion. 2. No significant joint space narrowing 3. Mild degenerative  irregularity at the right greater trochanter  06-13-13: left hip x-ray; 1. Mild diffuse osteopenia 2. No subluxation, dislocation or lytic destructive lesion. 3. Acute to subacute nondisplaced fracture at the left inferior pubic ramus. 4. A 2.0 cm old fracture fragment lateral to the left femoral neck   06-13-13: right femur x-ray: 1. Mild diffuse osteopenia 2. No acute fracture or lytic destructive lesion  06-13-13: left knee x-ray: 1. No acute fracture malalignment or lytic destructive lesion 2. No significant joint space narrowing  06-13-13; left tib/fib x-ray; 1. Mild diffuse osteopenia 2. No acute fracture or lytic destructive lesion 3. No radiographic evidence for osteomyelitis.   06-13-13: right tib/fib x-ray: mild diffuse osteopenia 2. No acute fracture or lytic destructive lesion. 3. No radiographic evidence for osteomyelitis  06-13-13; right knee x-ray; no acute fracture malalignment or lytic destructive lesion. No significant joint narrowing.     LABS REVIEWED:   01-07-13: wbc 8.7;hgb 12.5; hct 41.0; mcv 91.3;plt 362;glucose 167; bun 25; creat 0.8; k+3.3; na++138;  Liver normal albumin 3.7; chol 160; ldl 88; trig 57  04-02-13: wbc 7.8; hgb 13.9; hct 87.2; plt 292; glucose 105; bun 15; creat 0.61; k+3.4; na++ 143  04-03-13: glucose 108; bun 11; creat 0.64; k+4.3; na++144  06-17-13: glucose 102; bun 15; creat 0.7; k+3.5; na++144 06-21-13: urine culture: e-coli: macrobid      Review of Systems  Constitutional: Negative for malaise/fatigue.  Eyes: Negative for blurred vision.  Respiratory: Negative for cough and shortness of breath.   Cardiovascular: Negative for chest pain, palpitations and leg swelling.  Gastrointestinal: Negative for heartburn and abdominal pain.  Musculoskeletal: no complaints of pain present     Skin: Negative.   Neurological: Negative for dizziness and headaches.  Psychiatric/Behavioral: negative for anxiety and depression      Physical Exam    Constitutional: She is oriented to person, place, and time. No distress.  frail  Neck: Neck supple. No JVD present.  Cardiovascular: Normal rate, regular rhythm and intact distal pulses.   Respiratory: Effort normal and breath sounds normal. No respiratory distress. She has no wheezes.  GI: Soft. Bowel sounds are normal. She exhibits no distension. There is no tenderness.  Musculoskeletal: Normal range of motion. is able to move left leg.  Neurological: She is alert and oriented to person, place, and time.  Skin: Skin is warm and dry. She is not diaphoretic.  Psychiatric: She has a normal mood and affect.     ASSESSMENT/ PLAN:  1. Hypertension:  Her status is worse will continue norvasc 10 mg daily; cozaar 100 mg daily; lopressor 50 mg twice daily  Will  begin hctz 25 mg daily will have nursing check blood pressure twice daily 2 weeks and report will check bmp in 2 weeks. Will monitor her status.    ASSESSMENT/ PLAN:   FUTURE ORDERS:

## 2013-07-16 LAB — BASIC METABOLIC PANEL
BUN: 19 mg/dL (ref 4–21)
Glucose: 100 mg/dL
Sodium: 140 mmol/L (ref 137–147)

## 2013-07-23 ENCOUNTER — Non-Acute Institutional Stay (SKILLED_NURSING_FACILITY): Payer: Medicare Other | Admitting: Adult Health

## 2013-07-23 DIAGNOSIS — J45909 Unspecified asthma, uncomplicated: Secondary | ICD-10-CM

## 2013-07-23 DIAGNOSIS — M810 Age-related osteoporosis without current pathological fracture: Secondary | ICD-10-CM

## 2013-07-23 DIAGNOSIS — J4520 Mild intermittent asthma, uncomplicated: Secondary | ICD-10-CM

## 2013-07-23 DIAGNOSIS — F418 Other specified anxiety disorders: Secondary | ICD-10-CM

## 2013-07-23 DIAGNOSIS — E785 Hyperlipidemia, unspecified: Secondary | ICD-10-CM

## 2013-07-23 DIAGNOSIS — I10 Essential (primary) hypertension: Secondary | ICD-10-CM

## 2013-07-23 DIAGNOSIS — I4891 Unspecified atrial fibrillation: Secondary | ICD-10-CM

## 2013-07-23 DIAGNOSIS — F341 Dysthymic disorder: Secondary | ICD-10-CM

## 2013-07-23 DIAGNOSIS — I4819 Other persistent atrial fibrillation: Secondary | ICD-10-CM

## 2013-07-29 ENCOUNTER — Ambulatory Visit (INDEPENDENT_AMBULATORY_CARE_PROVIDER_SITE_OTHER): Payer: Medicare Other | Admitting: *Deleted

## 2013-07-29 DIAGNOSIS — I4891 Unspecified atrial fibrillation: Secondary | ICD-10-CM

## 2013-07-29 LAB — MDC_IDC_ENUM_SESS_TYPE_INCLINIC
Battery Remaining Longevity: 17 mo
Brady Statistic RA Percent Paced: 24 %
Brady Statistic RV Percent Paced: 1 %
Implantable Pulse Generator Model: 1290
Implantable Pulse Generator Serial Number: 764926
Lead Channel Impedance Value: 400 Ohm
Lead Channel Impedance Value: 500 Ohm
Lead Channel Pacing Threshold Pulse Width: 0.5 ms
Lead Channel Sensing Intrinsic Amplitude: 3 mV
Lead Channel Setting Pacing Amplitude: 2 V
Lead Channel Setting Pacing Amplitude: 2.4 V
MDC IDC MSMT LEADCHNL RA PACING THRESHOLD AMPLITUDE: 0.5 V
MDC IDC MSMT LEADCHNL RA PACING THRESHOLD PULSEWIDTH: 0.5 ms
MDC IDC MSMT LEADCHNL RV PACING THRESHOLD AMPLITUDE: 0.6 V
MDC IDC MSMT LEADCHNL RV SENSING INTR AMPL: 6 mV
MDC IDC SET LEADCHNL RV PACING PULSEWIDTH: 0.5 ms
MDC IDC SET LEADCHNL RV SENSING SENSITIVITY: 1 mV

## 2013-07-29 NOTE — Progress Notes (Signed)
Pacemaker check in clinic. Normal device function. Thresholds, sensing, impedances consistent with previous measurements. Device programmed to maximize longevity.1 mode switch, 7 seconds.  No high ventricular rates noted. Device programmed at appropriate safety margins. Histogram distribution appropriate for patient activity level. Device programmed to optimize intrinsic conduction. Estimated longevity 1.5 years.  Patient education completed.  ROV 6 months with Dr. Rayann Heman.

## 2013-08-07 ENCOUNTER — Encounter: Payer: Self-pay | Admitting: Internal Medicine

## 2013-08-11 ENCOUNTER — Encounter: Payer: Self-pay | Admitting: Adult Health

## 2013-08-11 NOTE — Progress Notes (Signed)
Patient ID: Yvonne Buck, female   DOB: 05-23-1929, 80 y.o.   MRN: [id number]     [facility]  Allergies  Allergen Reactions  . Penicillins Other (See Comments)    Red spots ### Tolerated Rocephin 12/2012 ###     Chief Complaint  Patient presents with  . Medical Management of Chronic Issues    HPI:  She is being seen for the management of her chronic illnesses. Overall her status is stable. she states she hit her right hand and now has some swelling and bruising present will get an x-ray. There are no concerns being voiced by the nursing staff at this time.   Past Medical History  Diagnosis Date  . Tachycardia-bradycardia syndrome     s/p [employer] PPM implant in [place]  . Paroxysmal atrial fibrillation   . Hypertension   . Frequent falls   . Asthma   . Hyperlipidemia   . Depression   . Anxiety   . Pacemaker   . Stroke     Past Surgical History  Procedure Laterality Date  . Pacemaker insertion  01/04/2005    [place] PPM 1290 [phone]), GDT [phone] atrial lead and 4457 V lead all implanted in [place]  . Cholecystectomy    . Gallbladder surgery    . Tonsillectomy and adenoidectomy      VITAL SIGNS BP 159/73  Pulse 64  Ht 5\' 5"  (1.651 m)  Wt 104 lb 3.2 oz (47.265 kg)  BMI 17.34 kg/m2   Patient's Medications  New Prescriptions   No medications on file  Previous Medications   ALBUTEROL (PROVENTIL HFA;VENTOLIN HFA) 108 (90 BASE) MCG/ACT INHALER    Inhale 2 puffs into the lungs every 6 (six) hours as needed for wheezing.   AMLODIPINE (NORVASC) 10 MG TABLET    Take 10 mg by mouth at bedtime.   ASPIRIN EC 325 MG TABLET    Take 1 tablet (325 mg total) by mouth daily.   CALCIUM CARBONATE (CALCIUM 600) 600 MG TABS TABLET    Take 1 tablet (600 mg total) by mouth 2 (two) times daily with a meal.   CHOLECALCIFEROL (VITAMIN D) 2000 UNITS CAPS    Take 1,000 Units by mouth daily.   FLUTICASONE (FLOVENT HFA) 110 MCG/ACT INHALER    Inhale 1 puff into the  lungs daily.   HYDROCHLOROTHIAZIDE (HYDRODIURIL) 25 MG TABLET    Take 1 tablet (25 mg total) by mouth daily.   HYDROCODONE-ACETAMINOPHEN (NORCO/VICODIN) 5-325 MG PER TABLET    Take one tablet by mouth every 6 hours as needed for mild pain; Take two tablets by mouth every 6 hours as needed for moderate to severe pain   LOSARTAN (COZAAR) 100 MG TABLET    Take 100 mg by mouth daily.   METOPROLOL TARTRATE (LOPRESSOR) 25 MG TABLET    Take 2 tablets (50 mg total) by mouth 2 (two) times daily.   MONTELUKAST (SINGULAIR) 10 MG TABLET    Take 10 mg by mouth at bedtime.   NITROGLYCERIN (NITROSTAT) 0.4 MG SL TABLET    Place 0.4 mg under the tongue every 5 (five) minutes as needed for chest pain.   SIMVASTATIN (ZOCOR) 40 MG TABLET    Take 40 mg by mouth daily.   TERIPARATIDE, RECOMBINANT, (FORTEO) 600 MCG/2.4ML SOLN    Inject 0.08 mLs (20 mcg total) into the skin daily. From 06-14-13 for 2 years   THEOPHYLLINE (UNIPHYL) 400 MG 24 HR TABLET    Take 400 mg by  mouth daily.   TRAMADOL (ULTRAM) 50 MG TABLET    Take 50 mg by mouth every 6 (six) hours as needed.  Modified Medications   No medications on file  Discontinued Medications   No medications on file    SIGNIFICANT DIAGNOSTIC EXAMS   04-02-13: pelvic x-ray: mild to moderate diffuse osteoporosis; no acute fracture or lytic destructive lesion. Mild osteoarthritis at the sacroiliac disease.   04-02-13: left hip x-ray: mild diffuse osteopenia. No acute fracture; malalignment or lytic destructive disease. There is fracture deformity at the left greater trochanter of indeterminate age possible acute or subacute.   04-02-13: left hip x-ray (hospital): Minimally displaced fracture of the left greater trochanter.  06-13-13: pelvic x-ray: 1. Mild to moderate diffuse osteopenia. 2. No lytic destructive lesion. 3. Nondisplaced acute or subacute fracture at the left inferior pubic ramus which has developed in the interval.   06-13-13: right hip x-ray: 1. No acute  fracture or malalignment or lytic destructive lesion. 2. No significant joint space narrowing 3. Mild degenerative irregularity at the right greater trochanter  06-13-13: left hip x-ray; 1. Mild diffuse osteopenia 2. No subluxation, dislocation or lytic destructive lesion. 3. Acute to subacute nondisplaced fracture at the left inferior pubic ramus. 4. A 2.0 cm old fracture fragment lateral to the left femoral neck   06-13-13: right femur x-ray: 1. Mild diffuse osteopenia 2. No acute fracture or lytic destructive lesion  06-13-13: left knee x-ray: 1. No acute fracture malalignment or lytic destructive lesion 2. No significant joint space narrowing  06-13-13; left tib/fib x-ray; 1. Mild diffuse osteopenia 2. No acute fracture or lytic destructive lesion 3. No radiographic evidence for osteomyelitis.   06-13-13: right tib/fib x-ray: mild diffuse osteopenia 2. No acute fracture or lytic destructive lesion. 3. No radiographic evidence for osteomyelitis  06-13-13; right knee x-ray; no acute fracture malalignment or lytic destructive lesion. No significant joint narrowing.   07-18-13: dexa: t score -0.5     LABS REVIEWED:   01-07-13: wbc 8.7;hgb 12.5; hct 41.0; mcv 91.3;plt 362;glucose 167; bun 25; creat 0.8; k+3.3; na++138;  Liver normal albumin 3.7; chol 160; ldl 88; trig 57  04-02-13: wbc 7.8; hgb 13.9; hct 87.2; plt 292; glucose 105; bun 15; creat 0.61; k+3.4; na++ 143  04-03-13: glucose 108; bun 11; creat 0.64; k+4.3; na++144  06-17-13: glucose 102; bun 15; creat 0.7; k+3.5; na++144 06-21-13: urine culture: e-coli: macrobid  07-17-13: glucose 100; bun 19; creat 0.6; k+3.5 ;na++140      Review of Systems  Constitutional: Negative for malaise/fatigue.  Eyes: Negative for blurred vision.  Respiratory: Negative for cough and shortness of breath.   Cardiovascular: Negative for chest pain, palpitations and leg swelling.  Gastrointestinal: Negative for heartburn and abdominal pain.  Musculoskeletal: no  complaints of pain present     Skin: Negative.   Neurological: Negative for dizziness and headaches.  Psychiatric/Behavioral: negative for anxiety and depression      Physical Exam  Constitutional: She is oriented to person, place, and time. No distress.  frail  Neck: Neck supple. No JVD present.  Cardiovascular: Normal rate, regular rhythm and intact distal pulses.   Respiratory: Effort normal and breath sounds normal. No respiratory distress. She has no wheezes.  GI: Soft. Bowel sounds are normal. She exhibits no distension. There is no tenderness.  Musculoskeletal: Normal range of motion. is able to move left leg. Has right hand bruising and swelling present  Neurological: She is alert and oriented to person, place, and time.  Skin:  Skin is warm and dry. She is not diaphoretic.  Psychiatric: She has a normal mood and affect.     ASSESSMENT/ PLAN:  1. Hypertension: will continue norvasc 10 mg daily; cozaar 100 mg daily; lopressor 50 mg twice daily and hctz 25 mg daily and will monitor   2. Afib: her heart rate is stable is status post pace maker insertion; is not a candidate for coumadin therapy. Will continue asa 325 mg daily   3. Osteoporosis: is status post multiple fractures of left trochanter fracture; and pelvic fracture;  will continue ca++ 600 mg twice daily and vit d 2000 units daily; will continue vitcodin 5/325 mg 1-2 tabs every 6 hours as needed for pain and ultram 50 mge very 6 hours as needed for pain as her dexa scan is normal will stop the forteo at this time.  and will monitor    4. Chronic asthma: she is presently stable; will continue flovent inhaler daily; will continue singulair 10 mg daily; will continue albuterol 2 puffs every 6 hours as needed will continue theophylline er 400 mg daily  Will monitor   5. Dyslipidemia: will continue zocor 40 mg daily   6. Depression with anxiety: she is presently not on medications is presently stable will continue to  monitor her status.     Ok Edwards NP [facility]  Contact [phone] Monday through Friday 8am- 5pm  After hours call [phone]

## 2013-08-17 ENCOUNTER — Encounter (HOSPITAL_COMMUNITY): Payer: Self-pay | Admitting: Emergency Medicine

## 2013-08-17 ENCOUNTER — Emergency Department (HOSPITAL_COMMUNITY): Payer: Medicare Other

## 2013-08-17 ENCOUNTER — Emergency Department (HOSPITAL_COMMUNITY)
Admission: EM | Admit: 2013-08-17 | Discharge: 2013-08-17 | Disposition: A | Discharge status: Home / Self Care | Payer: Medicare Other | Attending: Emergency Medicine | Admitting: Emergency Medicine

## 2013-08-17 DIAGNOSIS — J45909 Unspecified asthma, uncomplicated: Secondary | ICD-10-CM | POA: Diagnosis not present

## 2013-08-17 DIAGNOSIS — I10 Essential (primary) hypertension: Secondary | ICD-10-CM | POA: Diagnosis not present

## 2013-08-17 DIAGNOSIS — F329 Major depressive disorder, single episode, unspecified: Secondary | ICD-10-CM | POA: Diagnosis not present

## 2013-08-17 DIAGNOSIS — Z950 Presence of cardiac pacemaker: Secondary | ICD-10-CM | POA: Insufficient documentation

## 2013-08-17 DIAGNOSIS — Y929 Unspecified place or not applicable: Secondary | ICD-10-CM | POA: Insufficient documentation

## 2013-08-17 DIAGNOSIS — E785 Hyperlipidemia, unspecified: Secondary | ICD-10-CM | POA: Diagnosis not present

## 2013-08-17 DIAGNOSIS — M810 Age-related osteoporosis without current pathological fracture: Secondary | ICD-10-CM | POA: Diagnosis not present

## 2013-08-17 DIAGNOSIS — X58XXXA Exposure to other specified factors, initial encounter: Secondary | ICD-10-CM | POA: Insufficient documentation

## 2013-08-17 DIAGNOSIS — Z8673 Personal history of transient ischemic attack (TIA), and cerebral infarction without residual deficits: Secondary | ICD-10-CM | POA: Insufficient documentation

## 2013-08-17 DIAGNOSIS — S0300XA Dislocation of jaw, unspecified side, initial encounter: Secondary | ICD-10-CM | POA: Diagnosis not present

## 2013-08-17 DIAGNOSIS — Z7982 Long term (current) use of aspirin: Secondary | ICD-10-CM | POA: Diagnosis not present

## 2013-08-17 DIAGNOSIS — Z79899 Other long term (current) drug therapy: Secondary | ICD-10-CM | POA: Insufficient documentation

## 2013-08-17 DIAGNOSIS — I495 Sick sinus syndrome: Secondary | ICD-10-CM | POA: Insufficient documentation

## 2013-08-17 DIAGNOSIS — Y939 Activity, unspecified: Secondary | ICD-10-CM | POA: Insufficient documentation

## 2013-08-17 DIAGNOSIS — F411 Generalized anxiety disorder: Secondary | ICD-10-CM | POA: Insufficient documentation

## 2013-08-17 DIAGNOSIS — Z880 Allergy status to penicillin: Secondary | ICD-10-CM | POA: Diagnosis not present

## 2013-08-17 DIAGNOSIS — I4891 Unspecified atrial fibrillation: Secondary | ICD-10-CM | POA: Insufficient documentation

## 2013-08-17 DIAGNOSIS — F3289 Other specified depressive episodes: Secondary | ICD-10-CM | POA: Diagnosis not present

## 2013-08-17 DIAGNOSIS — R6884 Jaw pain: Secondary | ICD-10-CM | POA: Diagnosis present

## 2013-08-17 LAB — CBC WITH DIFFERENTIAL/PLATELET
Basophils Absolute: 0.1 10*3/uL (ref 0.0–0.1)
Basophils Relative: 1 % (ref 0–1)
EOS ABS: 2.1 10*3/uL — AB (ref 0.0–0.7)
Eosinophils Relative: 22 % — ABNORMAL HIGH (ref 0–5)
HCT: 41.9 % (ref 36.0–46.0)
Hemoglobin: 13.8 g/dL (ref 12.0–15.0)
LYMPHS ABS: 1.6 10*3/uL (ref 0.7–4.0)
Lymphocytes Relative: 17 % (ref 12–46)
MCH: 28.5 pg (ref 26.0–34.0)
MCHC: 32.9 g/dL (ref 30.0–36.0)
MCV: 86.4 fL (ref 78.0–100.0)
Monocytes Absolute: 0.6 10*3/uL (ref 0.1–1.0)
Monocytes Relative: 6 % (ref 3–12)
Neutro Abs: 5.2 10*3/uL (ref 1.7–7.7)
Neutrophils Relative %: 54 % (ref 43–77)
PLATELETS: 344 10*3/uL (ref 150–400)
RBC: 4.85 MIL/uL (ref 3.87–5.11)
RDW: 13.9 % (ref 11.5–15.5)
WBC: 9.6 10*3/uL (ref 4.0–10.5)

## 2013-08-17 LAB — BASIC METABOLIC PANEL
BUN: 20 mg/dL (ref 6–23)
CO2: 28 mEq/L (ref 19–32)
Calcium: 9.7 mg/dL (ref 8.4–10.5)
Chloride: 100 mEq/L (ref 96–112)
Creatinine, Ser: 0.69 mg/dL (ref 0.50–1.10)
GFR calc Af Amer: >90 mL/min (ref >90)
GFR calc non Af Amer: 79 mL/min — ABNORMAL LOW (ref >90)
Glucose, Bld: 94 mg/dL (ref 70–99)
Potassium: 4 mEq/L (ref 3.7–5.3)
SODIUM: 139 meq/L (ref 137–147)

## 2013-08-17 MED ORDER — PROPOFOL 10 MG/ML IV BOLUS
25.0000 mg | INTRAVENOUS | Status: DC | PRN
  Filled 2013-08-17: qty 1

## 2013-08-17 MED ORDER — MORPHINE SULFATE 2 MG/ML IJ SOLN: 2.0000 mg | Freq: Once | INTRAMUSCULAR | Status: DC

## 2013-08-17 NOTE — ED Notes (Signed)
Bed: WA10 Expected date:  Expected time:  Means of arrival:  Comments: ems 

## 2013-08-17 NOTE — Discharge Instructions (Signed)
Jaw Dislocation A jaw dislocation is the displacement of the joint where the upper jaw bone (maxilla) and the lower jaw bone (mandibula) meet (temporomandibular joint). Soon after the dislocation, the jaw muscles tighten. This prevents the mouth from closing normally.  CAUSES A jaw dislocation usually is caused by a sudden forceful impact to the jaw. A strong punch in the jaw during a fist fight or a sports injury are examples of causes of jaw dislocation. Another cause is injury due to car or motorcycle accidents. RISK FACTORS Although anyone can have a jaw dislocation, some people are more at risk than others. People at increased risk for jaw dislocation include participants in contact sports. SYMPTOMS Symptoms of jaw dislocation can vary, depending on the severity of the dislocation. They can include:  Feeling that your teeth are out of alignment when you bite.  Inability to close your mouth completely.  Drooling.  Extreme pain, with the inability to move your jaw. DIAGNOSIS Your caregiver will feel your temporomandibular joints and ask you to move your jaw. Your caregiver also will feel the inside of your mouth to make sure there are no fractures or cuts (lacerations). TREATMENT Your caregiver will manipulate your jaw to put it back into place (reduction). If you have any jaw fractures from the dislocation, they usually will be held in place with plates and screws or with wiring.  HOME CARE INSTRUCTIONS The following measures can help to reduce pain and hasten the healing process:  Rest your injured joint. Do not move it. Avoid activities similar to the one that caused your injury.  Apply ice to your injured joint for 1 to 2 days after your reduction or as directed by your caregiver. Applying ice helps to reduce inflammation and pain.  Put ice in a plastic bag.  Place a towel between your skin and the bag.  Leave the ice on for 15-20 minutes at a time, 03-04 times a day.  Take  over-the-counter or prescription medication for pain as directed by your caregiver. Also, your caregiver may instruct you to only have certain foods until your jaw heals. These foods may be soft or liquified so that your jaw does not have to move much to eat them. SEEK IMMEDIATE MEDICAL CARE IF:  You have plates and screws or wiring to hold your jaw together that becomes loose or damaged.  You develop drainage from any of the cuts (incisions) where your wires or plates and screws were placed.  Your pain becomes worse rather than better. MAKE SURE YOU:  Understand these instructions.  Will watch your condition.  Will get help right away if you are not doing well or get worse. Document Released: 02/05/2000 Document Revised: 05/02/2011 Document Reviewed: 07/08/2010 [facility] Patient Information 2015 [place], [place]. This information is not intended to replace advice given to you by your health care provider. Make sure you discuss any questions you have with your health care provider. Luxacin de la mandbula  (Jaw Dislocation)  La luxacin de la mandbula es el desplazamiento de la articulacin en la que el hueso del maxilar superior (maxilla) y el hueso del maxilar inferior (mandbula) se unen (articulacin temporomandibular). Poco despus de la luxacin, los msculos de la [place] se tensan. Esto impide que la boca se cierre normalmente.  CAUSAS  La causa suele ser un impacto repentino que aplica fuerza en la [place]. Un puetazo fuerte en la mandbula durante una lucha o una lesin deportiva son causas de la luxacin. Alcus Dad causa son  los accidentes automovilsticos o de moto.  FACTORES DE RIESGO  Aunque cualquier persona puede tener una luxacin en la mandbula, algunos tienen ms riesgo que otros. Las personas que tienen ms riesgo son los que practican deportes de Diplomatic Services operational officer.  SNTOMAS  Los sntomas varan segn la gravedad de la lesin. Estos pueden ser:   [employer] de que los dientes  no estn alineados al [employer].  Imposibilidad de cerrar la boca completamente.  Babeo  Dolor intenso, con imposibilidad de Psychologist, sport and exercise. DIAGNSTICO  El mdico palpar la articulacin temporomandibular y Water engineer pedir que mueva la [place]. Tambin palpar el interior de la boca para asegurarse de que no hay fracturas o cortes (laceraciones).  TRATAMIENTO  El mdico manipular la mandbula para Glass blower/designer los huesos en su lugar (reduccin). Si hay fracturas, se repararn con placas y tornillos o alambre.  INSTRUCCIONES PARA EL CUIDADO EN EL HOGAR  Las medidas siguientes ayudan a reducir Conservation officer, historic buildings y facilitan el proceso de curacin.   Haga descansar la articulacin lesionada. No la mueva. Evite las actividades similares a la que le produjo la lesin.  Aplique hielo en la articulacin lesionada durante 1  2 das despus de la reduccin, o segn lo que le indique su mdico. La aplicacin del hielo ayuda a reducir la inflamacin y Conservation officer, historic buildings.  Ponga el hielo en una bolsa plstica.  Colquese una toalla entre la piel y la bolsa de hielo.  Deje el hielo durante 15 a 51minutos, y aplquelo 3 a 4 veces por Training and development officer.  Tome slo medicamentos de venta libre o recetados para Glass blower/designer, segn las indicaciones del mdico. El mdico tambin podr darle indicaciones para que slo consuma ciertos alimentos hasta que se cure. Estos son alimentos blandos o lquidos de modo que no tenga que mover la mandbula demasiado al comer.  SOLICITE ATENCIN MDICA DE INMEDIATO SI:   Las placas, tornillos o alambres se aflojan o se daan.  Observa una secrecin en alguno de los cortes (incisiones) en los que le colocaron los alambres o las placas y tornillos.  El Nutritional therapist de Teacher, English as a foreign language. ASEGRESE DE QUE:   Comprende estas instrucciones.  Controlar su enfermedad.  Solicitar ayuda de inmediato si no mejora o si empeora. Document Released: 02/07/2005 Document Revised: 05/02/2011 [facility] Patient  Information 2015 [place]. This information is not intended to replace advice given to you by your health care provider. Make sure you discuss any questions you have with your health care provider.

## 2013-08-17 NOTE — ED Provider Notes (Signed)
CSN: [id number]     Arrival date & time 08/17/13  1130 History   First MD Initiated Contact with Patient 08/17/13 1151     Chief Complaint  Patient presents with  . Jaw Pain   HPI Patient presents to the emergency room with complaints of jaw pain. Patient states last night she suddenly had difficulty closing her mouth properly. She does not recall yawning or injuring herself or opening her mouth widely. Since that time, she has not been able to close her mouth properly. She was not able to eat or drink properly. It is somewhat difficult to understand the patient but she tells me she has had trouble with a jaw dislocation in the past. She denies any other numbness or weakness. No chest pain or shortness of breath. Past Medical History  Diagnosis Date  . Tachycardia-bradycardia syndrome     s/p [employer] PPM implant in [place]  . Paroxysmal atrial fibrillation   . Hypertension   . Frequent falls   . Asthma   . Hyperlipidemia   . Depression   . Anxiety   . Pacemaker   . Stroke   . Osteoporosis 06/23/2013   Past Surgical History  Procedure Laterality Date  . Pacemaker insertion  01/04/2005    [place] PPM [id number] [phone]), GDT [phone] atrial lead and 4457 V lead all implanted in [place]  . Cholecystectomy    . Gallbladder surgery    . Tonsillectomy and adenoidectomy     Family History  Problem Relation Age of Onset  . Diabetes Mother   . High blood pressure Mother   . Heart Problems Father    History  Substance Use Topics  . Smoking status: Never Smoker   . Smokeless tobacco: Never Used  . Alcohol Use: No   OB History   Grav Para Term Preterm Abortions TAB SAB Ect Mult Living                 Review of Systems  All other systems reviewed and are negative.     Allergies  Penicillins  Home Medications   Prior to Admission medications   Medication Sig Start Date End Date Taking? Authorizing Provider  albuterol (PROVENTIL HFA;VENTOLIN HFA) 108 (90 BASE)  MCG/ACT inhaler Inhale 2 puffs into the lungs every 6 (six) hours as needed for wheezing.   Yes Historical Provider, MD  amLODipine (NORVASC) 10 MG tablet Take 10 mg by mouth at bedtime.   Yes Historical Provider, MD  aspirin EC 325 MG tablet Take 1 tablet (325 mg total) by mouth daily. 06/23/13  Yes Gerlene Fee, NP  calcium carbonate (CALCIUM 600) 600 MG TABS tablet Take 1 tablet (600 mg total) by mouth 2 (two) times daily with a meal. 06/23/13  Yes Gerlene Fee, NP  Cholecalciferol (VITAMIN D) 2000 UNITS CAPS Take 1,000 Units by mouth daily. 06/23/13  Yes Gerlene Fee, NP  fluticasone (FLOVENT HFA) 110 MCG/ACT inhaler Inhale 1 puff into the lungs daily.   Yes Historical Provider, MD  hydrochlorothiazide (HYDRODIURIL) 25 MG tablet Take 1 tablet (25 mg total) by mouth daily. 07/07/13  Yes Gerlene Fee, NP  HYDROcodone-acetaminophen (NORCO/VICODIN) 5-325 MG per tablet Take one tablet by mouth every 6 hours as needed for mild pain; Take two tablets by mouth every 6 hours as needed for moderate to severe pain 06/17/13  Yes Tiffany L Reed, DO  losartan (COZAAR) 100 MG tablet Take 100 mg by mouth daily.   Yes  Historical Provider, MD  metoprolol tartrate (LOPRESSOR) 25 MG tablet Take 2 tablets (50 mg total) by mouth 2 (two) times daily.   Yes Gerlene Fee, NP  montelukast (SINGULAIR) 10 MG tablet Take 10 mg by mouth at bedtime.   Yes Historical Provider, MD  nitroGLYCERIN (NITROSTAT) 0.4 MG SL tablet Place 0.4 mg under the tongue every 5 (five) minutes as needed for chest pain.   Yes Historical Provider, MD  simvastatin (ZOCOR) 40 MG tablet Take 40 mg by mouth daily.   Yes Historical Provider, MD  theophylline (UNIPHYL) 400 MG 24 hr tablet Take 400 mg by mouth daily.   Yes Historical Provider, MD  traMADol (ULTRAM) 50 MG tablet Take 50 mg by mouth every 6 (six) hours as needed for moderate pain.  04/04/13  Yes Tiffany L Reed, DO   BP 151/66  Pulse 61  Temp(Src) 97.5 F (36.4 C) (Oral)  Resp 20   SpO2 93% Physical Exam  Nursing note and vitals reviewed. Constitutional: She appears well-developed and well-nourished. No distress.  HENT:  Head: Normocephalic and atraumatic.  Right Ear: External ear normal.  Left Ear: External ear normal.  Mouth/Throat: Mucous membranes are normal. No oral lesions. No lacerations.  Lower mandible seems to be protruding, patient is unable to bite and close her mouth properly, malocclusion  Eyes: Conjunctivae are normal. Right eye exhibits no discharge. Left eye exhibits no discharge. No scleral icterus.  Neck: Neck supple. No tracheal deviation present.  Cardiovascular: Normal rate, regular rhythm and intact distal pulses.   Pulmonary/Chest: Effort normal and breath sounds normal. No stridor. No respiratory distress. She has no wheezes. She has no rales.  Abdominal: Soft. Bowel sounds are normal. She exhibits no distension. There is no tenderness. There is no rebound and no guarding.  Musculoskeletal: She exhibits no edema and no tenderness.  Neurological: She is alert. She has normal strength. No cranial nerve deficit (no facial droop, extraocular movements intact, no slurred speech) or sensory deficit. She exhibits normal muscle tone. She displays no seizure activity. Coordination normal.  Skin: Skin is warm and dry. No rash noted.  Psychiatric: She has a normal mood and affect.    ED Course  Dental Date/Time: 08/17/2013 2:51 PM Performed by: Dorie Rank Authorized by: Dorie Rank Consent: Verbal consent obtained. Risks and benefits: risks, benefits and alternatives were discussed Patient tolerance: Patient tolerated the procedure well with no immediate complications. Comments: Pt requested no medications be given.  I attempted reduction of her mandible dislocation by applying pressure downward and posteriorly.  Successful reduction of her mandible clinically.  Post procedure pt is able to speak normally.  Normal occlusion of her dentition.  Normal jaw  movements.   (including critical care time) Labs Review Labs Reviewed  CBC WITH DIFFERENTIAL - Abnormal; Notable for the following:    Eosinophils Relative 22 (*)    Eosinophils Absolute 2.1 (*)    All other components within normal limits  BASIC METABOLIC PANEL - Abnormal; Notable for the following:    GFR calc non Af Amer 79 (*)    All other components within normal limits    Imaging Review Ct Maxillofacial Wo Cm  08/17/2013   CLINICAL DATA:  Facial pain  EXAM: CT MAXILLOFACIAL WITHOUT CONTRAST  TECHNIQUE: Multidetector CT imaging of the maxillofacial structures was performed. Multiplanar CT image reconstructions were also generated. A small metallic BB was placed on the right temple in order to reliably differentiate right from left.  COMPARISON:  None.  FINDINGS: There is recurrent complete anterior dislocation of the mandibular heads with respect to the TMJ sockets.  No evidence of fracture.  Minimal mucosal thickening in the maxillary sinuses. Mastoid air cells are clear.  Caries.  No obvious soft tissue abscess.  Airway is grossly patent.  IMPRESSION: Bilateral anterior TMJ dislocation.   Electronically Signed   By: Maryclare Bean M.D.   On: 08/17/2013 13:48   Initially planned on using propofol.  Pt became very concerned about any medications.  I was able to reduce without any medications.  MDM   Final diagnoses:  Dislocation of mandible, initial encounter    Pt successfully reduced clinically. I do not feel that post reduction films are necessary.  She can speak without difficulty.  Normal clinical appearance of her mandible.    Dorie Rank, MD 08/17/13 [phone]

## 2013-08-17 NOTE — ED Notes (Signed)
[facility] called for transport.  

## 2013-08-17 NOTE — ED Notes (Signed)
When asked if patient is having pain she points to right cheek area of face.  Patient's mouth seems to be open, but can close it if asked to.  Patient having garbled speech, difficult to understand.

## 2013-08-17 NOTE — ED Notes (Signed)
Patient got out of bed on her own.  Assisted back to bed.  Patient not willing to stay in bed.  MD at bedside in preparation for concious sedation.

## 2013-08-17 NOTE — ED Notes (Signed)
Patient from [facility] was sent in by family because they felt patient was having a difficult time swallowing and that her jaw was locked up.  Staff reports patient does not have any difficulty eating or taking medications.  Alert and oriented.

## 2013-08-27 ENCOUNTER — Non-Acute Institutional Stay (SKILLED_NURSING_FACILITY): Payer: Medicare Other | Admitting: Adult Health

## 2013-08-27 DIAGNOSIS — M810 Age-related osteoporosis without current pathological fracture: Secondary | ICD-10-CM

## 2013-08-27 DIAGNOSIS — F418 Other specified anxiety disorders: Secondary | ICD-10-CM

## 2013-08-27 DIAGNOSIS — F341 Dysthymic disorder: Secondary | ICD-10-CM

## 2013-08-27 DIAGNOSIS — I482 Chronic atrial fibrillation, unspecified: Secondary | ICD-10-CM

## 2013-08-27 DIAGNOSIS — I4891 Unspecified atrial fibrillation: Secondary | ICD-10-CM

## 2013-08-27 DIAGNOSIS — J45909 Unspecified asthma, uncomplicated: Secondary | ICD-10-CM

## 2013-08-27 DIAGNOSIS — J4520 Mild intermittent asthma, uncomplicated: Secondary | ICD-10-CM

## 2013-08-27 DIAGNOSIS — W19XXXS Unspecified fall, sequela: Secondary | ICD-10-CM

## 2013-08-27 DIAGNOSIS — I10 Essential (primary) hypertension: Secondary | ICD-10-CM

## 2013-08-27 DIAGNOSIS — T7589XS Other specified effects of external causes, sequela: Secondary | ICD-10-CM

## 2013-08-31 ENCOUNTER — Encounter: Payer: Self-pay | Admitting: Adult Health

## 2013-08-31 NOTE — Progress Notes (Signed)
Patient ID: Yvonne Buck, female   DOB: 1929/04/23, 80 y.o.   MRN: [id number]     [facility]  Allergies  Allergen Reactions  . Penicillins Other (See Comments)    Red spots ### Tolerated Rocephin 12/2012 ###     Chief Complaint  Patient presents with  . Medical Management of Chronic Issues    HPI:  She is being seen for the management of her chronic illnesses. She did have a bilateral tmj dislocation within this past month. She has had this happen to her in the past. She went to the ED for adjustment and return without further issues. She  has had a fall without injury as well. She is not complaining of any pain or discomfort at this time. She tells me that she is feeling good. There are no concerns being voiced by the nursing staff.    Past Medical History  Diagnosis Date  . Tachycardia-bradycardia syndrome     s/p [employer] PPM implant in [place]  . Paroxysmal atrial fibrillation   . Hypertension   . Frequent falls   . Asthma   . Hyperlipidemia   . Depression   . Anxiety   . Pacemaker   . Stroke   . Osteoporosis 06/23/2013    Past Surgical History  Procedure Laterality Date  . Pacemaker insertion  01/04/2005    [place] PPM 1290 [phone]), GDT [phone] atrial lead and 4457 V lead all implanted in [place]  . Cholecystectomy    . Gallbladder surgery    . Tonsillectomy and adenoidectomy      VITAL SIGNS BP 133/69  Pulse 60  Ht 5\' 5"  (1.651 m)  Wt 105 lb 6.4 oz (47.809 kg)  BMI 17.54 kg/m2   Patient's Medications  New Prescriptions   No medications on file  Previous Medications   ALBUTEROL (PROVENTIL HFA;VENTOLIN HFA) 108 (90 BASE) MCG/ACT INHALER    Inhale 2 puffs into the lungs every 6 (six) hours as needed for wheezing.   AMLODIPINE (NORVASC) 10 MG TABLET    Take 10 mg by mouth at bedtime.   ASPIRIN EC 325 MG TABLET    Take 1 tablet (325 mg total) by mouth daily.   CALCIUM CARBONATE (CALCIUM 600) 600 MG TABS TABLET    Take 1 tablet (600  mg total) by mouth 2 (two) times daily with a meal.   CHOLECALCIFEROL (VITAMIN D) 2000 UNITS CAPS    Take 2,000 Units by mouth daily.    FLUTICASONE (FLOVENT HFA) 110 MCG/ACT INHALER    Inhale 1 puff into the lungs daily.   HYDROCHLOROTHIAZIDE (HYDRODIURIL) 25 MG TABLET    Take 1 tablet (25 mg total) by mouth daily.   HYDROCODONE-ACETAMINOPHEN (NORCO/VICODIN) 5-325 MG PER TABLET    Take one tablet by mouth every 6 hours as needed for mild pain; Take two tablets by mouth every 6 hours as needed for moderate to severe pain   LOSARTAN (COZAAR) 100 MG TABLET    Take 100 mg by mouth daily.   METOPROLOL TARTRATE (LOPRESSOR) 25 MG TABLET    Take 2 tablets (50 mg total) by mouth 2 (two) times daily.   MONTELUKAST (SINGULAIR) 10 MG TABLET    Take 10 mg by mouth at bedtime.   NITROGLYCERIN (NITROSTAT) 0.4 MG SL TABLET    Place 0.4 mg under the tongue every 5 (five) minutes as needed for chest pain.   SIMVASTATIN (ZOCOR) 40 MG TABLET    Take 40 mg by mouth daily.  THEOPHYLLINE (UNIPHYL) 400 MG 24 HR TABLET    Take 400 mg by mouth daily.   TRAMADOL (ULTRAM) 50 MG TABLET    Take 50 mg by mouth every 6 (six) hours as needed for moderate pain.   Modified Medications   No medications on file  Discontinued Medications   No medications on file    SIGNIFICANT DIAGNOSTIC EXAMS   06-13-13: pelvic x-ray: 1. Mild to moderate diffuse osteopenia. 2. No lytic destructive lesion. 3. Nondisplaced acute or subacute fracture at the left inferior pubic ramus which has developed in the interval.   06-13-13: right hip x-ray: 1. No acute fracture or malalignment or lytic destructive lesion. 2. No significant joint space narrowing 3. Mild degenerative irregularity at the right greater trochanter  06-13-13: left hip x-ray; 1. Mild diffuse osteopenia 2. No subluxation, dislocation or lytic destructive lesion. 3. Acute to subacute nondisplaced fracture at the left inferior pubic ramus. 4. A 2.0 cm old fracture fragment lateral to  the left femoral neck   06-13-13: right femur x-ray: 1. Mild diffuse osteopenia 2. No acute fracture or lytic destructive lesion  06-13-13: left knee x-ray: 1. No acute fracture malalignment or lytic destructive lesion 2. No significant joint space narrowing  06-13-13; left tib/fib x-ray; 1. Mild diffuse osteopenia 2. No acute fracture or lytic destructive lesion 3. No radiographic evidence for osteomyelitis.   06-13-13: right tib/fib x-ray: mild diffuse osteopenia 2. No acute fracture or lytic destructive lesion. 3. No radiographic evidence for osteomyelitis  06-13-13; right knee x-ray; no acute fracture malalignment or lytic destructive lesion. No significant joint narrowing.   07-18-13: dexa: t score -0.5   08-17-13: maxillofacial ct: Bilateral anterior TMJ dislocation.    LABS REVIEWED:   01-07-13: wbc 8.7;hgb 12.5; hct 41.0; mcv 91.3;plt 362;glucose 167; bun 25; creat 0.8; k+3.3; na++138;  Liver normal albumin 3.7; chol 160; ldl 88; trig 57  04-02-13: wbc 7.8; hgb 13.9; hct 87.2; plt 292; glucose 105; bun 15; creat 0.61; k+3.4; na++ 143  04-03-13: glucose 108; bun 11; creat 0.64; k+4.3; na++144  06-17-13: glucose 102; bun 15; creat 0.7; k+3.5; na++144 06-21-13: urine culture: e-coli: macrobid  07-17-13: glucose 100; bun 19; creat 0.6; k+3.5 ;na++140  08-17-13: wbc 9.6; hgb 13.8; ct 41.9; mcv 86.4; plt 344; glucose 94; bun 20; creat 0.69; k+4.0; na++139      Review of Systems  Constitutional: Negative for malaise/fatigue.  Eyes: Negative for blurred vision.  Respiratory: Negative for cough and shortness of breath.   Cardiovascular: Negative for chest pain, palpitations and leg swelling.  Gastrointestinal: Negative for heartburn and abdominal pain.  Musculoskeletal: no complaints of pain present     Skin: Negative.   Neurological: Negative for dizziness and headaches.  Psychiatric/Behavioral: negative for anxiety and depression      Physical Exam  Constitutional: She is oriented to  person, place, and time. No distress.  frail  Neck: Neck supple. No JVD present.  Cardiovascular: Normal rate, regular rhythm and intact distal pulses.   Respiratory: Effort normal and breath sounds normal. No respiratory distress. She has no wheezes.  GI: Soft. Bowel sounds are normal. She exhibits no distension. There is no tenderness.  Musculoskeletal: Normal range of motion. is able to move left leg. Has right hand bruising and swelling present  Neurological: She is alert and oriented to person, place, and time.  Skin: Skin is warm and dry. She is not diaphoretic.  Psychiatric: She has a normal mood and affect.     ASSESSMENT/ PLAN:  1. Hypertension: will continue norvasc 10 mg daily; cozaar 100 mg daily; lopressor 50 mg twice daily and hctz 25 mg daily and will monitor   2. Afib: her heart rate is stable is status post pace maker insertion; is not a candidate for coumadin therapy. Will continue asa 325 mg daily   3. Osteoporosis: is status post multiple fractures of left trochanter fracture; and pelvic fracture in the past year;  will continue ca++ 600 mg twice daily and vit d 2000 units daily; will continue vitcodin 5/325 mg 1-2 tabs every 6 hours as needed for pain and ultram 50 mge very 6 hours as needed for pain and will monitor    4. Chronic asthma: she is presently stable; will continue flovent inhaler daily; will continue singulair 10 mg daily; will continue albuterol 2 puffs every 6 hours as needed will continue theophylline er 400 mg daily  Will monitor   5. Dyslipidemia: will continue zocor 40 mg daily   6. Depression with anxiety: she is presently not on medications is presently stable will continue to monitor her status.    Will check theophylline level; tsh lipids and liver    Ok Edwards NP [facility]  Contact [phone] Monday through Friday 8am- 5pm  After hours call [phone]

## 2013-10-14 ENCOUNTER — Non-Acute Institutional Stay (SKILLED_NURSING_FACILITY): Payer: PRIVATE HEALTH INSURANCE | Admitting: Internal Medicine

## 2013-10-14 ENCOUNTER — Encounter: Payer: Self-pay | Admitting: Internal Medicine

## 2013-10-14 DIAGNOSIS — E785 Hyperlipidemia, unspecified: Secondary | ICD-10-CM

## 2013-10-14 DIAGNOSIS — I10 Essential (primary) hypertension: Secondary | ICD-10-CM

## 2013-10-14 DIAGNOSIS — M810 Age-related osteoporosis without current pathological fracture: Secondary | ICD-10-CM

## 2013-10-14 DIAGNOSIS — I4891 Unspecified atrial fibrillation: Secondary | ICD-10-CM

## 2013-10-14 DIAGNOSIS — J45909 Unspecified asthma, uncomplicated: Secondary | ICD-10-CM

## 2013-10-14 DIAGNOSIS — I482 Chronic atrial fibrillation, unspecified: Secondary | ICD-10-CM

## 2013-10-14 DIAGNOSIS — E46 Unspecified protein-calorie malnutrition: Secondary | ICD-10-CM

## 2013-10-14 DIAGNOSIS — J4520 Mild intermittent asthma, uncomplicated: Secondary | ICD-10-CM

## 2013-10-14 NOTE — Progress Notes (Signed)
Patient ID: Yvonne Buck, female   DOB: [birth year], 80 y.o.   MRN: [id number]  Location:  [facility]  Provider:  Blanchie Serve, MD  Code Status:  Full  Chief Complaint  Patient presents with  . Medical Management of Chronic Issues    HPI:  80 y/o female patient is seen today for routine visit. She is a long term care resident of the facility and is now under [facility] services. She is at her baseline with her dementia, HTN, CKD, PVD among others she continues to have falls. She has history of osteopenia and left hp fracture in 03/2013. She denies any complaints this visit. No new nursing concerns  Review of Systems:  Constitutional: Negative for fever, chills, weight loss, malaise/fatigue and diaphoresis.  HENT: Negative for congestion, hearing loss and sore throat.   Eyes: Negative for blurred vision, double vision and discharge.  Respiratory: Negative for cough, sputum production, shortness of breath and wheezing.   Cardiovascular: Negative for chest pain, palpitations, orthopnea and leg swelling.  Gastrointestinal: Negative for heartburn, nausea, vomiting, abdominal pain, diarrhea and constipation.  Genitourinary: Negative for dysuria Musculoskeletal: Negative for back pain,myalgias.  Skin: Negative for itching and rash.  Neurological:  Negative for dizziness, tingling, focal weakness and headaches.  Psychiatric/Behavioral: has memory loss  Medications: Patient's Medications  New Prescriptions   No medications on file  Previous Medications   ALBUTEROL (PROVENTIL HFA;VENTOLIN HFA) 108 (90 BASE) MCG/ACT INHALER    Inhale 2 puffs into the lungs every 6 (six) hours as needed for wheezing.   AMLODIPINE (NORVASC) 10 MG TABLET    Take 10 mg by mouth at bedtime.   ASPIRIN EC 325 MG TABLET    Take 1 tablet (325 mg total) by mouth daily.   CALCIUM CARBONATE (CALCIUM 600) 600 MG TABS TABLET    Take 1 tablet (600 mg total) by mouth 2 (two) times daily with a meal.   CHOLECALCIFEROL  (VITAMIN D) 2000 UNITS CAPS    Take 2,000 Units by mouth daily.    FLUTICASONE (FLOVENT HFA) 110 MCG/ACT INHALER    Inhale 1 puff into the lungs daily.   HYDROCHLOROTHIAZIDE (HYDRODIURIL) 25 MG TABLET    Take 1 tablet (25 mg total) by mouth daily.   HYDROCODONE-ACETAMINOPHEN (NORCO/VICODIN) 5-325 MG PER TABLET    Take one tablet by mouth every 6 hours as needed for mild pain; Take two tablets by mouth every 6 hours as needed for moderate to severe pain   LOSARTAN (COZAAR) 100 MG TABLET    Take 100 mg by mouth daily.   METOPROLOL TARTRATE (LOPRESSOR) 25 MG TABLET    Take 2 tablets (50 mg total) by mouth 2 (two) times daily.   MONTELUKAST (SINGULAIR) 10 MG TABLET    Take 10 mg by mouth at bedtime.   NITROGLYCERIN (NITROSTAT) 0.4 MG SL TABLET    Place 0.4 mg under the tongue every 5 (five) minutes as needed for chest pain.   SIMVASTATIN (ZOCOR) 40 MG TABLET    Take 40 mg by mouth daily.   TRAMADOL (ULTRAM) 50 MG TABLET    Take 50 mg by mouth every 6 (six) hours as needed for moderate pain.   Modified Medications   No medications on file  Discontinued Medications   THEOPHYLLINE (UNIPHYL) 400 MG 24 HR TABLET    Take 400 mg by mouth daily.    Physical Exam: Filed Vitals:   10/14/13 1626  BP: 114/86  Pulse: 68  Temp: 98.2 F (36.8 C)  Resp: 20  Height: 5\' 5"  (1.651 m)  Weight: 105 lb 6.4 oz (47.809 kg)   Constitutional: She is oriented to person, place, and time. No distress. frail  Neck: Neck supple. No JVD present.  Cardiovascular: Normal rate, regular rhythm and intact distal pulses.   Respiratory: Effort normal and breath sounds normal. No respiratory distress. She has no wheezes.  GI: Soft. Bowel sounds are normal. She exhibits no distension. There is no tenderness.  Musculoskeletal: Normal range of motion. Propels self in wheelchair, able to pull to standing Neurological: She is alert and oriented to person, place, and time.  Skin: Skin is warm and dry. She is not diaphoretic.    Psychiatric: She has a normal mood and affect.   Labs reviewed: Basic Metabolic Panel:  Recent Labs  04/02/13 1858 04/03/13 0950 07/16/13 08/17/13 1223  NA 143 144 140 139  K 3.4* 4.3  --  4.0  CL 104 107  --  100  CO2 27 27  --  28  GLUCOSE 105* 108*  --  94  BUN 15 11 19 20   CREATININE 0.61 0.64  --  0.69  CALCIUM 9.2 9.1  --  9.7    Liver Function Tests:  Recent Labs  12/31/12 1125 01/01/13 0515 01/03/13 0530  AST 17 14 15   ALT 15 11 12   ALKPHOS 101 85 82  BILITOT 0.5 0.5 0.3  PROT 6.8 6.0 6.1  ALBUMIN 3.6 3.0* 2.9*    CBC:  Recent Labs  01/03/13 0530 04/02/13 1858 08/17/13 1223  WBC 8.0 7.8 9.6  NEUTROABS 3.2 4.5 5.2  HGB 12.1 13.9 13.8  HCT 36.3 42.2 41.9  MCV 89.2 87.2 86.4  PLT 335 292 344    Assessment/Plan  Asthma breathing stable. Continue theophylline and singulair with proventil  Hypertension On norvasc 10 mg daily, cozaar 100 mg daily,  lopressor 50 mg twice daily and hctz 12.5 mg daily. Her bp remains on lower side of normal and with her increased fall risk, will d/c her hctz for now.monitor clinically   Afib she is status post pace maker insertion. Rate controlled with lopressor. Continue asa 325 mg daily for now  Osteoporosis status post multiple fractures. continue ca-vit d supplement. She is a candidate for bisphonates. If family agrees, will have her on fosamax 70 mg po once a week. will continue vitcodin 5/325 mg 1-2 tabs every 6 hours as needed for pain and ultram 50 mge very 6 hours as needed for pain and will monitor. Fall precuations  Dyslipidemia continue zocor 40 mg daily   Protein calorie malnutrition Continue medpass, encourage po intake, monitor clinically.

## 2013-10-15 LAB — BASIC METABOLIC PANEL
BUN: 20 mg/dL (ref 4–21)
CREATININE: 0.7 mg/dL (ref 0.5–1.1)
Glucose: 102 mg/dL
Potassium: 3.6 mmol/L (ref 3.4–5.3)
Sodium: 140 mmol/L (ref 137–147)

## 2013-10-15 LAB — CBC AND DIFFERENTIAL
HCT: 46 % (ref 36–46)
Hemoglobin: 14.2 g/dL (ref 12.0–16.0)
Platelets: 324 10*3/uL (ref 150–399)
WBC: 8.8 10*3/mL

## 2013-10-15 LAB — TSH: TSH: 0.77 u[IU]/mL (ref 0.41–5.90)

## 2013-10-22 LAB — HEPATIC FUNCTION PANEL
ALK PHOS: 46 U/L (ref 25–125)
ALT: 11 U/L (ref 7–35)
AST: 13 U/L (ref 13–35)
BILIRUBIN, TOTAL: 0.6 mg/dL
Bilirubin, Direct: 0.1 mg/dL (ref 0.01–0.4)

## 2013-11-18 ENCOUNTER — Encounter: Payer: Self-pay | Admitting: Internal Medicine

## 2013-11-18 ENCOUNTER — Non-Acute Institutional Stay (SKILLED_NURSING_FACILITY): Payer: PRIVATE HEALTH INSURANCE | Admitting: Internal Medicine

## 2013-11-18 DIAGNOSIS — E785 Hyperlipidemia, unspecified: Secondary | ICD-10-CM

## 2013-11-18 DIAGNOSIS — F39 Unspecified mood [affective] disorder: Secondary | ICD-10-CM

## 2013-11-18 DIAGNOSIS — I10 Essential (primary) hypertension: Secondary | ICD-10-CM

## 2013-11-18 NOTE — Progress Notes (Addendum)
Patient ID: Yvonne Buck, female   DOB: 1929-05-13, 80 y.o.   MRN: [id number]    Facility: [facility]  Chief complaint- Routine visit  Allergies reviewed- rocephin  HPI:   80 y/o female patient is seen today for routine visit. She has mood disorder and had med adjustment done recently by psych. She is tolerating seroquel and lamictal well. she is at her baseline with her dementia and denies any complaints this visit. No new nursing concerns  Review of Systems:  Constitutional: Negative for fever, chills, weight loss, malaise/fatigue and diaphoresis.  Respiratory: Negative for cough, sputum production, shortness of breath and wheezing.   Cardiovascular: Negative for chest pain, palpitations, orthopnea and leg swelling.  Gastrointestinal: Negative for heartburn, nausea, vomiting, abdominal pain.  Genitourinary: Negative for dysuria Musculoskeletal: Negative for back pain,myalgias.  Skin: Negative for itching and rash.  Neurological:  Negative for dizziness, tingling, focal weakness and headaches.  Psychiatric/Behavioral: has memory loss  Current Outpatient Prescriptions on File Prior to Visit  Medication Sig Dispense Refill  . albuterol (PROVENTIL HFA;VENTOLIN HFA) 108 (90 BASE) MCG/ACT inhaler Inhale 2 puffs into the lungs every 6 (six) hours as needed for wheezing.      Marland Kitchen amLODipine (NORVASC) 10 MG tablet Take 10 mg by mouth at bedtime.      Marland Kitchen aspirin EC 325 MG tablet Take 1 tablet (325 mg total) by mouth daily.  30 tablet  11  . calcium carbonate (CALCIUM 600) 600 MG TABS tablet Take 1 tablet (600 mg total) by mouth 2 (two) times daily with a meal.  60 tablet  11  . Cholecalciferol (VITAMIN D) 2000 UNITS CAPS Take 2,000 Units by mouth daily.       . fluticasone (FLOVENT HFA) 110 MCG/ACT inhaler Inhale 1 puff into the lungs daily.      . hydrochlorothiazide (HYDRODIURIL) 25 MG tablet Take 1 tablet (25 mg total) by mouth daily.  90 tablet  3  .  HYDROcodone-acetaminophen (NORCO/VICODIN) 5-325 MG per tablet Take one tablet by mouth every 6 hours as needed for mild pain; Take two tablets by mouth every 6 hours as needed for moderate to severe pain  240 tablet  0  . losartan (COZAAR) 100 MG tablet Take 100 mg by mouth daily.      . metoprolol tartrate (LOPRESSOR) 25 MG tablet Take 2 tablets (50 mg total) by mouth 2 (two) times daily.  120 tablet  11  . montelukast (SINGULAIR) 10 MG tablet Take 10 mg by mouth at bedtime.      . nitroGLYCERIN (NITROSTAT) 0.4 MG SL tablet Place 0.4 mg under the tongue every 5 (five) minutes as needed for chest pain.      . simvastatin (ZOCOR) 40 MG tablet Take 40 mg by mouth daily.      . traMADol (ULTRAM) 50 MG tablet Take 50 mg by mouth every 6 (six) hours as needed for moderate pain.        No current facility-administered medications on file prior to visit.   Physical exam  Vss, afebrile  Constitutional: She is oriented to person, place, and time. No distress. frail   Neck: Neck supple. No JVD present.   Cardiovascular: Normal rate, regular rhythm and intact distal pulses.    Respiratory: Effort normal and breath sounds normal. No respiratory distress. She has no wheezes.   GI: Soft. Bowel sounds are normal. She exhibits no distension. There is no tenderness.  Musculoskeletal: Normal range of motion.  Propels self in wheelchair Neurological: She is alert   Skin: Skin is warm and dry. She is not diaphoretic.  Psychiatric: She has a normal mood and affect.   Assessment/plan  Mood disorder Continue lamictal 25 mg daily until 11/20/13 and then increase to 50 mg daily.  Also continue seroquel 25 mg daily  HTN bp started rising after discontinuing hctz, has been restarted on this. Continue baby aspirin. Continue cozaar, lopressor and norvasc  Hyperlipidemia Continue zocor 40 mg daily and monitor lipid

## 2013-12-16 ENCOUNTER — Non-Acute Institutional Stay (SKILLED_NURSING_FACILITY): Payer: PRIVATE HEALTH INSURANCE | Admitting: Internal Medicine

## 2013-12-16 ENCOUNTER — Encounter: Payer: Self-pay | Admitting: Internal Medicine

## 2013-12-16 DIAGNOSIS — J45909 Unspecified asthma, uncomplicated: Secondary | ICD-10-CM

## 2013-12-16 DIAGNOSIS — I10 Essential (primary) hypertension: Secondary | ICD-10-CM

## 2013-12-16 DIAGNOSIS — M810 Age-related osteoporosis without current pathological fracture: Secondary | ICD-10-CM

## 2013-12-16 DIAGNOSIS — I480 Paroxysmal atrial fibrillation: Secondary | ICD-10-CM

## 2013-12-16 NOTE — Progress Notes (Signed)
Patient ID: Yvonne Buck, female   DOB: Jun 16, 1929, 80 y.o.   MRN: [id number]   Place of Service: [facility]  Allergies  Allergen Reactions  . Penicillins Other (See Comments)    Red spots ### Tolerated Rocephin 12/2012 ###    Code Status: Full Code  Goals of Care: Longevity/Long term care  Chief Complaint  Patient presents with  . Medical Management of Chronic Issues    asthma, osteoporosis, HTN, paroxysmal afib    HPI 80 y.o. female with PMH of chronic asthma, osteoporosis, htn, and paroxysmal afib among others is being seen for a routine visit. No falls reported from nursing staff. No skin issues or change in functional status. Weight stable. No change in appetite or bowel habit. No concerns from staff.   Review of Systems Constitutional: Negative for fever, chills, and fatigue. HENT: Negative for ear pain, congestion, and sore throat.  Eyes: Negative for eye pain, eye discharge, and visual disturbance  Cardiovascular: Negative for chest pain, palpitations, and leg swelling Respiratory: Negative cough, shortness of breath, and wheezing.  Gastrointestinal: Negative for nausea and vomiting. Negative for abdominal pain, diarrhea and constipation.  Genitourinary: Negative for  dysuria, frequency, urgency, and hematuri Musculoskeletal: Negative for back pain, joint pain, and joint swelling  Neurological: Negative for dizziness, headache, and tremors.  Skin: Negative for rash and wound.   Psychiatric: Negative for nervous/anxious, agitation, depression.   Past Medical History  Diagnosis Date  . Tachycardia-bradycardia syndrome     s/p [employer] PPM implant in [place]  . Paroxysmal atrial fibrillation   . Hypertension   . Frequent falls   . Asthma   . Hyperlipidemia   . Depression   . Anxiety   . Pacemaker   . Stroke   . Osteoporosis 06/23/2013    Past Surgical History  Procedure Laterality Date  . Pacemaker insertion  01/04/2005    [place] PPM 1290 [phone]), GDT [phone] atrial lead and 4457 V lead all implanted in [place]  . Cholecystectomy    . Gallbladder surgery    . Tonsillectomy and adenoidectomy      History   Social History  . Marital Status: Widowed    Spouse Name: N/A    Number of Children: 2  . Years of Education: college   Occupational History  .      retired   Social History Main Topics  . Smoking status: Never Smoker   . Smokeless tobacco: Never Used  . Alcohol Use: No  . Drug Use: No  . Sexual Activity: No   Other Topics Concern  . Not on file   Social History Narrative   Recently moved to [place] to live with her daughter.  Previously lived in [place].     Daughter . [name].     Patient has some college education.   Right handed   Caffeine- None      Medication List       This list is accurate as of: 12/16/13 12:34 PM.  Always use your most recent med list.               albuterol 108 (90 BASE) MCG/ACT inhaler  Commonly known as:  PROVENTIL HFA;VENTOLIN HFA  Inhale 2 puffs into the lungs every 6 (six) hours as needed for wheezing.     alendronate 70 MG tablet  Commonly known as:  FOSAMAX  Take 70 mg by mouth once a week. Take with a  full glass of water on an empty stomach.     amLODipine 10 MG tablet  Commonly known as:  NORVASC  Take 10 mg by mouth at bedtime.     aspirin EC 325 MG tablet  Take 1 tablet (325 mg total) by mouth daily.     calcium carbonate 600 MG Tabs tablet  Commonly known as:  CALCIUM 600  Take 1 tablet (600 mg total) by mouth 2 (two) times daily with a meal.     fluticasone 110 MCG/ACT inhaler  Commonly known as:  FLOVENT HFA  Inhale 1 puff into the lungs daily.     hydrochlorothiazide 25 MG tablet  Commonly known as:  HYDRODIURIL  Take 1 tablet (25 mg total) by mouth daily.     HYDROcodone-acetaminophen 5-325 MG per tablet  Commonly known as:  NORCO/VICODIN  Take one tablet by mouth every 6 hours as needed  for mild pain; Take two tablets by mouth every 6 hours as needed for moderate to severe pain     lamoTRIgine 25 MG tablet  Commonly known as:  LAMICTAL  Take 50 mg by mouth daily.     losartan 100 MG tablet  Commonly known as:  COZAAR  Take 100 mg by mouth daily.     metoprolol tartrate 25 MG tablet  Commonly known as:  LOPRESSOR  Take 2 tablets (50 mg total) by mouth 2 (two) times daily.     montelukast 10 MG tablet  Commonly known as:  SINGULAIR  Take 10 mg by mouth at bedtime.     nitroGLYCERIN 0.4 MG SL tablet  Commonly known as:  NITROSTAT  Place 0.4 mg under the tongue every 5 (five) minutes as needed for chest pain.     QUEtiapine 25 MG tablet  Commonly known as:  SEROQUEL  Take 25 mg by mouth at bedtime.     simvastatin 40 MG tablet  Commonly known as:  ZOCOR  Take 40 mg by mouth daily.     theophylline 400 MG 24 hr tablet  Commonly known as:  UNIPHYL  Take 400 mg by mouth daily.     traMADol 50 MG tablet  Commonly known as:  ULTRAM  Take 50 mg by mouth every 6 (six) hours as needed for moderate pain.     Vitamin D 2000 UNITS Caps  Take 2,000 Units by mouth daily.        Physical Exam Filed Vitals:   12/16/13 1221  BP: 146/70  Pulse: 64  Temp: 97.8 F (36.6 C)  Resp: 17   Constitutional: thin elderly Hispanic female in no acute distress.  HEENT: Normocephalic and atraumatic. PERRL. EOM intact. No icterus. Wears glasses.No nasal discharge or sinus tenderness. Oral mucosa moist. Posterior pharynx clear of any exudate or lesions. Teeth and gingiva in good general condition.  Neck: Supple and nontender. No lymphadenopathy, masses, or thyromegaly. No JVD or carotid bruits. Cardiac: Normal S1, S2. RRR without appreciable murmurs, rubs, or gallops. distal pulses intact. No dependent edema.  Lungs: No respiratory distress. Breath sounds clear bilaterally without rales, rhonchi, or wheezes. Abdomen: Audible bowel sounds in all quadrants. Soft, nontender,  nondistended. No palpable mass.  Musculoskeletal: No joint erythema or tenderness. Able to move all four extremities   Skin: Warm and dry. No rash noted. No erythema. Positive for bruise on L cheekbone Neurological: Alert and oriented to person and place.  Psychiatric: Judgment and insight adequate. Appropriate mood and affect.   Labs Reviewed CBC Latest Ref Rng  10/15/2013 08/17/2013 04/02/2013  WBC - 8.8 9.6 7.8  Hemoglobin 12.0 - 16.0 g/dL 14.2 13.8 13.9  Hematocrit 36 - 46 % 46 41.9 42.2  Platelets 150 - 399 K/L 324 344 292    CMP     Component Value Date/Time   NA 140 10/15/2013   NA 139 08/17/2013 1223   K 3.6 10/15/2013   CL 100 08/17/2013 1223   CO2 28 08/17/2013 1223   GLUCOSE 94 08/17/2013 1223   BUN 20 10/15/2013   BUN 20 08/17/2013 1223   CREATININE 0.7 10/15/2013   CREATININE 0.69 08/17/2013 1223   CREATININE 0.68 12/10/2010 1018   CALCIUM 9.7 08/17/2013 1223   PROT 6.1 01/03/2013 0530   ALBUMIN 2.9* 01/03/2013 0530   AST 13 10/22/2013   ALT 11 10/22/2013   ALKPHOS 46 10/22/2013   BILITOT 0.3 01/03/2013 0530   GFRNONAA 79* 08/17/2013 1223   GFRAA >90 08/17/2013 1223    Lab Results  Component Value Date   TSH 0.77 10/15/2013    Assessment & Plan 1. Chronic asthma, unspecified asthma severity, uncomplicated Stable. Continue flovent 172mcg daily, theophylline 400mg  ER daily, albuterol 2 puffs Q6H PRN and singular 10mg  daily. Continue to monitor.   2. Osteoporosis No falls reported from nursing staff. Continue fosamax 70mg  weekly, calcium supplement, and vit D supplement. Continue fall risk precautions.   3. Paroxysmal a-fib In NSR on exam. Rate-controlled. Continue asa 81mg  daily and lopressor 50mg  twice daily. Continue to monitor   4. HTN (hypertension), benign Stable. Continue lopressor 50mg  daily, norvasc 10mg  daily, and hctz 12.5mg  daily. Continue to monitor BPs.    Family/Staff Communication Plan of care discuss with resident and professional staff members. Resident  and professional staff members verbalize understanding and agree with plan of care. No additional questions or concerns reported.    Arthur Holms, MSN, AGNP-C [facility] [place], [place] [address] [phone] [8am-5pm] After hours: [phone]   I have personally reviewed this note and agree with the care plan  [facility], MD  [facility] [phone] (Monday-Friday 8 am - 5 pm) [phone] (afterhours)

## 2014-01-16 ENCOUNTER — Non-Acute Institutional Stay (SKILLED_NURSING_FACILITY): Payer: PRIVATE HEALTH INSURANCE | Admitting: Internal Medicine

## 2014-01-16 DIAGNOSIS — J4530 Mild persistent asthma, uncomplicated: Secondary | ICD-10-CM

## 2014-01-16 DIAGNOSIS — M810 Age-related osteoporosis without current pathological fracture: Secondary | ICD-10-CM

## 2014-01-16 DIAGNOSIS — J309 Allergic rhinitis, unspecified: Secondary | ICD-10-CM

## 2014-01-16 DIAGNOSIS — E785 Hyperlipidemia, unspecified: Secondary | ICD-10-CM

## 2014-01-16 NOTE — Progress Notes (Signed)
Patient ID: Yvonne Buck, female   DOB: April 11, 1929, 80 y.o.   MRN: [id number]    Facility: [facility]  Chief complaint- Routine visit  Allergies reviewed- PCN  HPI:   80 y/o female patient is seen today for routine visit. She has been having runny nose x 3 days. She has nasal stuffiness and clear discharge. Denies any cough or sore throat. No fever or chills. Denies muscle aches. She is followed by psych services for her mood. No recent falls reported. Weight is low but stable.   Review of Systems:  Constitutional: Negative for fever, chills, malaise/fatigue and diaphoresis.   Respiratory: Negative for cough, sputum production, shortness of breath and wheezing.    Cardiovascular: Negative for chest pain, palpitations Gastrointestinal: Negative for heartburn, nausea, vomiting, abdominal pain.   Genitourinary: Negative for dysuria Musculoskeletal: Negative for back pain,myalgias.   Skin: Negative for itching and rash.   Neurological:  Negative for dizziness and headaches.   Psychiatric/Behavioral: has memory loss  Past Medical History  Diagnosis Date  . Tachycardia-bradycardia syndrome     s/p [employer] PPM implant in [place]  . Paroxysmal atrial fibrillation   . Hypertension   . Frequent falls   . Asthma   . Hyperlipidemia   . Depression   . Anxiety   . Pacemaker   . Stroke   . Osteoporosis 06/23/2013   Medication reviewed. See [facility]  Physical exam BP 136/68 mmHg  Pulse 68  Temp(Src) 97.8 F (36.6 C)  Resp 18  Wt 119 lb (53.978 kg)  SpO2 96%  Constitutional: She is oriented to person, place, and time. No distress. frail   Neck: Neck supple. No JVD present.   Nose: clear nasal discharge, no sinus tenderness Cardiovascular: Normal rate, regular rhythm and intact distal pulses.    Respiratory: Effort normal and breath sounds normal. No respiratory distress. She has no wheezes.   GI: Soft. Bowel sounds are normal. She exhibits  no distension. There is no tenderness.  Musculoskeletal: Normal range of motion. Propels self in wheelchair and able to pull to standing Neurological: She is alert   Skin: Skin is warm and dry. She is not diaphoretic.  Psychiatric: She has a normal mood and affect.   Labs 10/14/13 wbc 8.8, hb 14.2, hct 45.8, plt 324 10/15/13 na 140, k 3.6, bun 20, cr 0.7, glu 102, ca 10.1, tsh 0.768 10/22/13 lft wnl  Assessment/plan  Allergic rhinitis Will have her on loratidine 10 mg daily for 3 days, then daily as needed to help with her allergy symptoms  Asthma No signs of exacerbation at present. Will onset of allergy symptom, monitor for worsening. Continue her singulair and theophylline with her bronchodilators  Senile osteoporosis High fall risk, fall precuations, continue calcium and vit d supplement  HLD Continue zocor 40 mg daily, lab check in 12/15

## 2014-02-16 ENCOUNTER — Ambulatory Visit: Payer: Self-pay

## 2014-02-16 LAB — URINALYSIS, COMPLETE
BACTERIA: NONE SEEN
Bilirubin,UR: NEGATIVE
Blood: NEGATIVE
Glucose,UR: NEGATIVE mg/dL (ref 0–75)
Ketone: NEGATIVE
LEUKOCYTE ESTERASE: NEGATIVE
NITRITE: NEGATIVE
PROTEIN: NEGATIVE
Ph: 7 (ref 4.5–8.0)
SPECIFIC GRAVITY: 1.018 (ref 1.003–1.030)
SQUAMOUS EPITHELIAL: NONE SEEN

## 2014-02-17 LAB — URINE CULTURE

## 2014-02-19 ENCOUNTER — Other Ambulatory Visit: Payer: Self-pay | Admitting: *Deleted

## 2014-02-19 MED ORDER — LORAZEPAM 0.5 MG PO TABS: ORAL_TABLET | ORAL | Status: DC

## 2014-02-19 NOTE — Telephone Encounter (Signed)
[facility] 

## 2014-02-20 ENCOUNTER — Non-Acute Institutional Stay (SKILLED_NURSING_FACILITY): Payer: PRIVATE HEALTH INSURANCE | Admitting: Internal Medicine

## 2014-02-20 DIAGNOSIS — I209 Angina pectoris, unspecified: Secondary | ICD-10-CM

## 2014-02-20 DIAGNOSIS — F319 Bipolar disorder, unspecified: Secondary | ICD-10-CM

## 2014-02-20 DIAGNOSIS — J4530 Mild persistent asthma, uncomplicated: Secondary | ICD-10-CM

## 2014-02-24 ENCOUNTER — Encounter: Payer: Self-pay | Admitting: Internal Medicine

## 2014-02-26 DIAGNOSIS — M810 Age-related osteoporosis without current pathological fracture: Secondary | ICD-10-CM | POA: Insufficient documentation

## 2014-02-26 DIAGNOSIS — E785 Hyperlipidemia, unspecified: Secondary | ICD-10-CM | POA: Insufficient documentation

## 2014-02-26 DIAGNOSIS — J45909 Unspecified asthma, uncomplicated: Secondary | ICD-10-CM | POA: Insufficient documentation

## 2014-02-26 DIAGNOSIS — J309 Allergic rhinitis, unspecified: Secondary | ICD-10-CM

## 2014-02-26 HISTORY — DX: Allergic rhinitis, unspecified: J30.9

## 2014-02-26 HISTORY — DX: Age-related osteoporosis without current pathological fracture: M81.0

## 2014-02-26 NOTE — Progress Notes (Signed)
Patient ID: Yvonne Buck, female   DOB: 1929-04-30, 81 y.o.   MRN: [id number]    Facility: [facility]  Chief complaint- Routine visit  Allergies reviewed- PCN  HPI:   81 y/o female patient is seen today for routine visit. She has been at her baseline. Had Chest pain on 02/06/14 resolved with NTG. Denies chest pain today. Sitting on her wheelchair and mentions she misses her family this holiday season. No recent falls reported. Gained some weight.  Review of Systems:  Constitutional: Negative for fever, chills, malaise/fatigue and diaphoresis.   Respiratory: Negative for cough, sputum production, shortness of breath and wheezing.    Cardiovascular: Negative for chest pain, palpitations Gastrointestinal: Negative for heartburn, nausea, vomiting, abdominal pain.   Genitourinary: Negative for dysuria Musculoskeletal: Negative for back pain,myalgias.   Skin: Negative for itching and rash.   Neurological:  Negative for dizziness and headaches.   Psychiatric/Behavioral: has memory loss  Past Medical History  Diagnosis Date  . Tachycardia-bradycardia syndrome     s/p [employer] PPM implant in [place]  . Paroxysmal atrial fibrillation   . Hypertension   . Frequent falls   . Asthma   . Hyperlipidemia   . Depression   . Anxiety   . Pacemaker   . Stroke   . Osteoporosis 06/23/2013   Medication reviewed. See [facility]  Physical exam BP 140/78 mmHg  Pulse 80  Temp(Src) 98 F (36.7 C)  Resp 18  SpO2 96%  Constitutional: She is oriented to person, place, and time. No distress. frail   Neck: Neck supple. No JVD present.   Nose: clear nasal discharge, no sinus tenderness Cardiovascular: Normal rate, regular rhythm and intact distal pulses.    Respiratory: Effort normal and breath sounds normal. No respiratory distress. She has no wheezes.   GI: Soft. Bowel sounds are normal. She exhibits no distension. There is no tenderness.  Musculoskeletal:  Normal range of motion. Propels self in wheelchair and able to pull to standing Neurological: She is alert   Skin: Skin is warm and dry. She is not diaphoretic.  Psychiatric: She has a normal mood and affect.   Labs 10/14/13 wbc 8.8, hb 14.2, hct 45.8, plt 324 10/15/13 na 140, k 3.6, bun 20, cr 0.7, glu 102, ca 10.1, tsh 0.768 10/22/13 lft wnl 12/1/15wbc 7.4, hb 13, hct 41.1, plt 266, na 141, k 3.7, bun 15, cr 0.6, glu 98, t.chol 143, tg 83, ldl 73, hdl 53, tsh 0.85  Assessment/plan  Angina pectoris Remains chest pain free at present. Continue aspirin and lopressor with prn NTG,   Asthma Stable, continue singulair, theophylline and advair, therapeutic blood level of theophylline at present  Bipolar disorder Stable on lamictal and ativan. Followed by psych services. Calm at present

## 2014-02-28 ENCOUNTER — Other Ambulatory Visit: Payer: Self-pay | Admitting: *Deleted

## 2014-02-28 MED ORDER — LORAZEPAM 0.5 MG PO TABS: ORAL_TABLET | ORAL | Status: DC

## 2014-02-28 NOTE — Telephone Encounter (Signed)
[facility] 

## 2014-03-03 ENCOUNTER — Ambulatory Visit: Payer: Self-pay | Admitting: Physician Assistant

## 2014-03-14 ENCOUNTER — Encounter: Payer: Self-pay | Admitting: Internal Medicine

## 2014-03-21 ENCOUNTER — Non-Acute Institutional Stay (SKILLED_NURSING_FACILITY): Payer: Medicare Other | Admitting: Internal Medicine

## 2014-03-21 DIAGNOSIS — F03918 Unspecified dementia, unspecified severity, with other behavioral disturbance: Secondary | ICD-10-CM

## 2014-03-21 DIAGNOSIS — F0391 Unspecified dementia with behavioral disturbance: Secondary | ICD-10-CM

## 2014-03-21 DIAGNOSIS — F3177 Bipolar disorder, in partial remission, most recent episode mixed: Secondary | ICD-10-CM

## 2014-03-21 DIAGNOSIS — I10 Essential (primary) hypertension: Secondary | ICD-10-CM

## 2014-03-21 DIAGNOSIS — M810 Age-related osteoporosis without current pathological fracture: Secondary | ICD-10-CM

## 2014-03-21 NOTE — Progress Notes (Signed)
Patient ID: Yvonne Buck, female   DOB: 1929/07/23, 81 y.o.   MRN: [id number]    Facility: [facility]  Chief complaint- Routine visit  Allergies reviewed- PCN  HPI:   81 y/o female patient is seen today for routine visit. She has been at her baseline today and is calm. She has been followed by pscyh services for recent episodes of psychosis. She had been combative towards her room mate. There were changes made to her psych medications. She responded well to im risperdal. Her lamictal dose was also adjusted. She was noted to have hypokalemia and kcl was provided. She denies any concerns today. She is on a geri chair. No recent falls reported.  Review of Systems:  Constitutional: Negative for fever, chills, malaise/fatigue and diaphoresis.   Respiratory: Negative for cough, sputum production, shortness of breath and wheezing.    Cardiovascular: Negative for chest pain, palpitations Gastrointestinal: Negative for heartburn, nausea, vomiting, abdominal pain.   Genitourinary: Negative for dysuria Musculoskeletal: Negative for back pain,myalgias.   Skin: Negative for itching and rash.   Neurological:  Negative for dizziness and headaches.   Psychiatric/Behavioral: has memory loss  Past Medical History  Diagnosis Date  . Tachycardia-bradycardia syndrome     s/p [employer] PPM implant in [place]  . Paroxysmal atrial fibrillation   . Hypertension   . Frequent falls   . Asthma   . Hyperlipidemia   . Depression   . Anxiety   . Pacemaker   . Stroke   . Osteoporosis 06/23/2013   Medication reviewed. See [facility]  Physical exam BP 136/88 mmHg  Pulse 72  Temp(Src) 97.6 F (36.4 C)  Resp 18  SpO2 96%  Constitutional: frail and in no acute distress.  Neck: Neck supple. No JVD present.   Nose: clear nasal discharge, no sinus tenderness Cardiovascular: Normal rate, regular rhythm and intact distal pulses.    Respiratory: Effort normal and breath  sounds normal. No respiratory distress. She has no wheezes.   GI: Soft. Bowel sounds are normal. She exhibits no distension. There is no tenderness.  Musculoskeletal: Normal range of motion. Propels self in wheelchair and able to pull to standing Neurological: She is alert  Skin: Skin is warm and dry. She is not diaphoretic.  Psychiatric: poor insight and judgement  Labs 10/14/13 wbc 8.8, hb 14.2, hct 45.8, plt 324 10/15/13 na 140, k 3.6, bun 20, cr 0.7, glu 102, ca 10.1, tsh 0.768 10/22/13 lft wnl 12/1/15wbc 7.4, hb 13, hct 41.1, plt 266, na 141, k 3.7, bun 15, cr 0.6, glu 98, t.chol 143, tg 83, ldl 73, hdl 53, tsh 0.85  Assessment/plan  Dementia with behavioral disturbance Calm today, continue im risperdal for 2 weeks, reassess. Continue lamictal and ativan, off seroquel  HTN bp stable, continue losartan 100 mg daily with lopressor, norvasc and HCTZ. Monitor bp readings  Osteoporosis Continue weekly fosamax and vitamin d, fall precautions  Bipolar disorder Stable on lamictal and ativan. Followed by psych services. Calm at present

## 2014-03-24 ENCOUNTER — Encounter: Payer: Self-pay | Admitting: Internal Medicine

## 2014-03-24 ENCOUNTER — Ambulatory Visit (INDEPENDENT_AMBULATORY_CARE_PROVIDER_SITE_OTHER): Payer: Medicare Other | Admitting: Internal Medicine

## 2014-03-24 VITALS — BP 150/76 | HR 61 | Ht 66.0 in | Wt 123.0 lb

## 2014-03-24 DIAGNOSIS — I480 Paroxysmal atrial fibrillation: Secondary | ICD-10-CM

## 2014-03-24 DIAGNOSIS — I495 Sick sinus syndrome: Secondary | ICD-10-CM

## 2014-03-24 DIAGNOSIS — Z950 Presence of cardiac pacemaker: Secondary | ICD-10-CM

## 2014-03-24 DIAGNOSIS — I10 Essential (primary) hypertension: Secondary | ICD-10-CM

## 2014-03-24 LAB — MDC_IDC_ENUM_SESS_TYPE_INCLINIC
Implantable Pulse Generator Model: 1290
Implantable Pulse Generator Serial Number: 764926
Lead Channel Impedance Value: 520 Ohm
Lead Channel Pacing Threshold Pulse Width: 0.5 ms
Lead Channel Sensing Intrinsic Amplitude: 3 mV
Lead Channel Setting Pacing Amplitude: 2 V
Lead Channel Setting Pacing Amplitude: 2.4 V
Lead Channel Setting Pacing Pulse Width: 0.5 ms
MDC IDC MSMT LEADCHNL RA PACING THRESHOLD AMPLITUDE: 0.4 V
MDC IDC MSMT LEADCHNL RA PACING THRESHOLD PULSEWIDTH: 0.5 ms
MDC IDC MSMT LEADCHNL RV IMPEDANCE VALUE: 410 Ohm
MDC IDC MSMT LEADCHNL RV PACING THRESHOLD AMPLITUDE: 0.7 V
MDC IDC MSMT LEADCHNL RV SENSING INTR AMPL: 6 mV — AB
MDC IDC SESS DTM: 20160201050000
MDC IDC SET LEADCHNL RV SENSING SENSITIVITY: 1 mV
MDC IDC STAT BRADY RA PERCENT PACED: 28 %
MDC IDC STAT BRADY RV PERCENT PACED: 1 %

## 2014-03-24 NOTE — Patient Instructions (Signed)
Your physician wants you to follow-up in: 6 months with Chanetta Marshall, NP You will receive a reminder letter in the mail two months in advance. If you don't receive a letter, please call our office to schedule the follow-up appointment.

## 2014-03-24 NOTE — Progress Notes (Signed)
Electrophysiology Office Note   Date:  03/24/2014   ID:  Yvonne Buck, DOB November 11, 1929, MRN [id number]  PCP:  Blanchie Serve, MD  Primary Electrophysiologist: Thompson Grayer, MD    Chief Complaint  Patient presents with  . Fatigue    SA Node Dysfunction     History of Present Illness: Yvonne Buck is a 81 y.o. female who presents today for electrophysiology evaluation.   She presents for routine device follow-up.  She is fatigued today.  She appears mildly sedated.  She takes a fair bit of ativan. She has had several ED visits for falls/ injury over the past year.  She denies exertional CP.  She has stable SOB.  Today, she denies symptoms of palpitations,  lower extremity edema, claudication, dizziness, presyncope, syncope, bleeding, or neurologic sequela. The patient is tolerating medications without difficulties and is otherwise without complaint today.    Past Medical History  Diagnosis Date  . Tachycardia-bradycardia syndrome     s/p [employer] PPM implant in [place]  . Paroxysmal atrial fibrillation   . Hypertension   . Frequent falls   . Asthma   . Hyperlipidemia   . Depression   . Anxiety   . Pacemaker   . Stroke   . Osteoporosis 06/23/2013   Past Surgical History  Procedure Laterality Date  . Pacemaker insertion  01/04/2005    [place] PPM 1290 [phone]), GDT [phone] atrial lead and 4457 V lead all implanted in [place]  . Cholecystectomy    . Gallbladder surgery    . Tonsillectomy and adenoidectomy       Current Outpatient Prescriptions  Medication Sig Dispense Refill  . albuterol (PROVENTIL HFA;VENTOLIN HFA) 108 (90 BASE) MCG/ACT inhaler Inhale 2 puffs into the lungs every 6 (six) hours as needed for wheezing.    Marland Kitchen amLODipine (NORVASC) 10 MG tablet Take 10 mg by mouth at bedtime.    Marland Kitchen aspirin 81 MG chewable tablet Chew 81 mg by mouth daily.    . calcium carbonate (CALCIUM 600) 600 MG TABS tablet Take 1 tablet (600 mg total) by mouth 2 (two)  times daily with a meal. 60 tablet 11  . Cholecalciferol (VITAMIN D) 2000 UNITS CAPS Take 2,000 Units by mouth daily.     . Glycerin-Hypromellose-PEG 400 (VISINE TEARS OP) Apply 1 drop to eye 2 (two) times daily as needed (DRY EYES).    . hydrochlorothiazide (HYDRODIURIL) 25 MG tablet Take 1 tablet (25 mg total) by mouth daily. 90 tablet 3  . HYDROcodone-acetaminophen (NORCO/VICODIN) 5-325 MG per tablet Take one tablet by mouth every 6 hours as needed for mild pain; Take two tablets by mouth every 6 hours as needed for moderate to severe pain 240 tablet 0  . isosorbide mononitrate (IMDUR) 30 MG 24 hr tablet Take 30 mg by mouth every morning.    . lamoTRIgine (LAMICTAL) 100 MG tablet Take 100 mg by mouth daily.    Marland Kitchen loratadine (CLARITIN) 10 MG tablet Take 10 mg by mouth daily as needed for allergies.    Marland Kitchen LORazepam (ATIVAN) 0.5 MG tablet Take 0.75 mg by mouth 2 (two) times daily.    Marland Kitchen LORazepam (ATIVAN) 2 MG/ML concentrated solution Take 0.5 mg by mouth every 6 (six) hours as needed for anxiety.    Marland Kitchen losartan (COZAAR) 100 MG tablet Take 100 mg by mouth daily.    . metoprolol (LOPRESSOR) 50 MG tablet Take 50 mg by mouth 2 (two) times daily.    . montelukast (SINGULAIR)  10 MG tablet Take 10 mg by mouth at bedtime.    . nitroGLYCERIN (NITROSTAT) 0.4 MG SL tablet Place 0.4 mg under the tongue every 5 (five) minutes as needed for chest pain.    Marland Kitchen RISPERIDONE PO (OTD)  Diss    . simvastatin (ZOCOR) 40 MG tablet Take 40 mg by mouth daily.    . theophylline (UNIPHYL) 400 MG 24 hr tablet Take 400 mg by mouth daily.    . traMADol (ULTRAM) 50 MG tablet Take 50 mg by mouth every 6 (six) hours as needed for moderate pain.     Marland Kitchen alendronate (FOSAMAX) 70 MG tablet Take 70 mg by mouth once a week. Take with a full glass of water on an empty stomach.    . fluticasone (FLOVENT HFA) 110 MCG/ACT inhaler Inhale 1 puff into the lungs daily.    . QUEtiapine (SEROQUEL) 25 MG tablet Take 25 mg by mouth at bedtime.      No current facility-administered medications for this visit.    Allergies:   Penicillins   Social History:  The patient  reports that she has never smoked. She has never used smokeless tobacco. She reports that she does not drink alcohol or use illicit drugs.   Family History:  The patient's family history includes Diabetes in her mother; Heart Problems in her father; High blood pressure in her mother.    PHYSICAL EXAM: VS:  BP 150/76 mmHg  Pulse 61  Ht 5\' 6"  (1.676 m)  Wt 123 lb (55.792 kg)  BMI 19.86 kg/m2 , BMI Body mass index is 19.86 kg/(m^2). GEN: fatigued appearing today, in no acute distress HEENT: normal Neck: no JVD, carotid bruits, or masses Cardiac: RRR; no murmurs, rubs, or gallops,no edema  Respiratory:  clear to auscultation bilaterally, normal work of breathing GI: soft, nontender, nondistended, + BS MS: in a rolling chair today Skin: warm and dry, device pocket is well healed Neuro:  Strength and sensation are intact Psych: euthymic mood, full affect  Device interrogation is reviewed today in detail.  See PaceArt for details.   Recent Labs: 10/15/2013: BUN 20; Creatinine 0.7; Hemoglobin 14.2; Platelets 324; Potassium 3.6; Sodium 140; TSH 0.77 10/22/2013: ALT 11    Lipid Panel     Component Value Date/Time   LDLDIRECT 83 12/10/2010 1018     Wt Readings from Last 3 Encounters:  03/24/14 123 lb (55.792 kg)  01/16/14 119 lb (53.978 kg)  12/16/13 113 lb 12.8 oz (51.619 kg)      Other studies Reviewed: Additional studies/ records that were reviewed today include: medicines from outside facility, several recent ER visits  Review of the above records today demonstrates: falls   ASSESSMENT AND PLAN:  1.  Sick sinus syndrome Normal pacemaker function See Pace Art report No changes today  2. afib Well controlled (no afib by PPM interrogation today) chads2vasc score is at least 6.  She is not a candidate for anticoagulation due to falls.  Her AF  burden is low.  3. HTN Stable No change required today    Current medicines are reviewed at length with the patient today.   I have advised that her primary physician consider reducing ativan dosing as she appears overly sedated today.  This is communicated in my note to the SNF where she resides.  Follow-up: return to follow-up with Chanetta Marshall in the EP device clinic in 6 months Her battery is approaching ERI (estimated longevity is 1 year)  Signed, Thompson Grayer, MD  03/24/2014 12:22 PM     [address] [place] [address] [phone] (office) [phone] (fax)

## 2014-03-28 ENCOUNTER — Encounter: Payer: Self-pay | Admitting: Internal Medicine

## 2014-04-04 ENCOUNTER — Other Ambulatory Visit: Payer: Self-pay | Admitting: *Deleted

## 2014-04-04 MED ORDER — LORAZEPAM 0.5 MG PO TABS: ORAL_TABLET | ORAL | Status: DC

## 2014-04-04 NOTE — Telephone Encounter (Signed)
[facility] 

## 2014-04-11 ENCOUNTER — Other Ambulatory Visit: Payer: Self-pay

## 2014-04-11 MED ORDER — LORAZEPAM 0.5 MG PO TABS: ORAL_TABLET | ORAL | Status: DC

## 2014-04-11 NOTE — Telephone Encounter (Signed)
Rx faxed to [facility] @ [phone], phone number [phone]  

## 2014-04-17 ENCOUNTER — Other Ambulatory Visit: Payer: Self-pay

## 2014-04-17 MED ORDER — LORAZEPAM 0.5 MG PO TABS: ORAL_TABLET | ORAL | Status: DC

## 2014-04-17 NOTE — Telephone Encounter (Signed)
Rx faxed to [facility] @ [phone], phone number [phone]  

## 2014-04-24 ENCOUNTER — Non-Acute Institutional Stay (SKILLED_NURSING_FACILITY): Payer: Medicare Other | Admitting: Internal Medicine

## 2014-04-24 DIAGNOSIS — I10 Essential (primary) hypertension: Secondary | ICD-10-CM

## 2014-04-24 DIAGNOSIS — B370 Candidal stomatitis: Secondary | ICD-10-CM

## 2014-04-24 DIAGNOSIS — G2401 Drug induced subacute dyskinesia: Secondary | ICD-10-CM | POA: Diagnosis not present

## 2014-04-24 DIAGNOSIS — D721 Eosinophilia, unspecified: Secondary | ICD-10-CM

## 2014-04-24 DIAGNOSIS — R1310 Dysphagia, unspecified: Secondary | ICD-10-CM | POA: Diagnosis not present

## 2014-04-24 NOTE — Progress Notes (Signed)
Patient ID: Yvonne Buck, female   DOB: 1929-06-22, 81 y.o.   MRN: [id number]    Facility: [facility]  Chief complaint- acute visit Code status: DNR  Allergies reviewed- PCN  HPI:   81 y/o female patient is seen today for acute visit. She was noted to have anti psychotic related tardive dyskinesia 3 days back. Her risperdal has been discontinued and she is on cogentin at present. She has tongue protrusions but as per staff this is improved from the day of onset.  She is on iv fluids due to her difficulty swallowing and is getting it through clysis. She is unable to articulate her words, can say yes/ no but it is difficult to communicate with her. As per staff and family members of other residents in hallway, she is able communicate slightly more than when this occurred and is more alert and interactive today.  No fall reported by staff. She has poor po intake and still appears somewhat confused while trying to interview her.  Review of Systems:  Unable to obtain at present  Past Medical History  Diagnosis Date  . Tachycardia-bradycardia syndrome     s/p [employer] PPM implant in [place]  . Paroxysmal atrial fibrillation     chads2vasc score is at least 6.  She is not a candidate for anticoagulation due to falls.  Her AF burden is low.  . Hypertension   . Frequent falls   . Asthma   . Hyperlipidemia   . Depression   . Anxiety   . Pacemaker   . Stroke   . Osteoporosis 06/23/2013   Medication reviewed. See [facility]  Physical exam BP 177/76 mmHg  Pulse 67  Temp(Src) 98.6 F (37 C)  Resp 16  Constitutional: frail and in no acute distress Mouth: oral thrush noted, dry mucus membrane, poor dentition, intermittent tongue protrusion noted, unable to open mouth further, head tilted to right side Neck: Neck supple. No JVD present.   Nose: clear nasal discharge, no sinus tenderness Cardiovascular: Normal rate, regular rhythm and intact distal  pulses.    Respiratory: Effort normal and breath sounds normal. No respiratory distress. She has no wheezes.   GI: Soft. Bowel sounds are normal. She exhibits no distension. There is no tenderness.  Musculoskeletal: able to move her arms, on gerichair, mild spasticity of arms noted, can move her legs Neurological: appears confused, slurred speech Skin: Skin is warm and dry. She is not diaphoretic.   Labs 04/22/14 chest xray- no focal infiltrates or effusion, mild osteoporosis and arthritis 04/22/14 kub- no bowel obstruction. Moderate stool in colon 04/23/14 wbc 7.5, hb 13.3, hct 40, plt 275, eosinophil 24, na 139, k 3.7, bun 20, cr 0.53, glu 82, alb 3.1, ca 8.7 04/24/14 urine culture- < 50,000  Colonies. Multiple bacteria, none predominant  Assessment/plan  Tardive dyskinesia Much improved as per staff. Continue cogentin for now. Off risperdal. Hold off on all antipyshcotics for now- lamictal and ativan  Dysphagia Oral thrush on exam. Start fluconazole 100 mg po daily x 3 days if can take, nystatin swish and spit tid for a week. SLP to evaluate and start dietary recs. Iv fluids for now  Oral thrush Start fluconazole 100 mg po daily x 3 days if can take, nystatin swish and spit tid for a week.  HTN Elevated bp reading at present. Her not taking her po meds could be contributing to this. On losartan 100 mg daily, hctz 12.5 mg daily, imdur  30 mg daily and lopressor 50 mg bid, norvasc 10 mg daily. Increase hctz to 25 mg daily. Monitor bp q shift for now  Eosinophilia Her chronic asthma, allergic rhinitis and recent stress in setting of side effects of antipyshcotics could be contributing to this. Monitor clinically for now

## 2014-04-30 ENCOUNTER — Emergency Department (HOSPITAL_COMMUNITY)
Admission: EM | Admit: 2014-04-30 | Discharge: 2014-04-30 | Disposition: A | Discharge status: Home / Self Care | Payer: Medicare Other | Attending: Emergency Medicine | Admitting: Emergency Medicine

## 2014-04-30 ENCOUNTER — Emergency Department (HOSPITAL_COMMUNITY): Payer: Medicare Other

## 2014-04-30 ENCOUNTER — Encounter (HOSPITAL_COMMUNITY): Payer: Self-pay | Admitting: Emergency Medicine

## 2014-04-30 DIAGNOSIS — F329 Major depressive disorder, single episode, unspecified: Secondary | ICD-10-CM | POA: Diagnosis not present

## 2014-04-30 DIAGNOSIS — Z8673 Personal history of transient ischemic attack (TIA), and cerebral infarction without residual deficits: Secondary | ICD-10-CM | POA: Insufficient documentation

## 2014-04-30 DIAGNOSIS — Y929 Unspecified place or not applicable: Secondary | ICD-10-CM | POA: Insufficient documentation

## 2014-04-30 DIAGNOSIS — I480 Paroxysmal atrial fibrillation: Secondary | ICD-10-CM | POA: Diagnosis not present

## 2014-04-30 DIAGNOSIS — Y999 Unspecified external cause status: Secondary | ICD-10-CM | POA: Diagnosis not present

## 2014-04-30 DIAGNOSIS — X58XXXA Exposure to other specified factors, initial encounter: Secondary | ICD-10-CM | POA: Diagnosis not present

## 2014-04-30 DIAGNOSIS — Z880 Allergy status to penicillin: Secondary | ICD-10-CM | POA: Insufficient documentation

## 2014-04-30 DIAGNOSIS — F419 Anxiety disorder, unspecified: Secondary | ICD-10-CM | POA: Diagnosis not present

## 2014-04-30 DIAGNOSIS — Z7951 Long term (current) use of inhaled steroids: Secondary | ICD-10-CM | POA: Insufficient documentation

## 2014-04-30 DIAGNOSIS — S0300XA Dislocation of jaw, unspecified side, initial encounter: Secondary | ICD-10-CM

## 2014-04-30 DIAGNOSIS — Z9181 History of falling: Secondary | ICD-10-CM | POA: Diagnosis not present

## 2014-04-30 DIAGNOSIS — Z7982 Long term (current) use of aspirin: Secondary | ICD-10-CM | POA: Insufficient documentation

## 2014-04-30 DIAGNOSIS — Z950 Presence of cardiac pacemaker: Secondary | ICD-10-CM | POA: Insufficient documentation

## 2014-04-30 DIAGNOSIS — Y939 Activity, unspecified: Secondary | ICD-10-CM | POA: Insufficient documentation

## 2014-04-30 DIAGNOSIS — J45909 Unspecified asthma, uncomplicated: Secondary | ICD-10-CM | POA: Insufficient documentation

## 2014-04-30 DIAGNOSIS — Z79899 Other long term (current) drug therapy: Secondary | ICD-10-CM | POA: Insufficient documentation

## 2014-04-30 DIAGNOSIS — M810 Age-related osteoporosis without current pathological fracture: Secondary | ICD-10-CM | POA: Diagnosis not present

## 2014-04-30 DIAGNOSIS — I10 Essential (primary) hypertension: Secondary | ICD-10-CM | POA: Insufficient documentation

## 2014-04-30 DIAGNOSIS — E785 Hyperlipidemia, unspecified: Secondary | ICD-10-CM | POA: Insufficient documentation

## 2014-04-30 DIAGNOSIS — S030XXA Dislocation of jaw, initial encounter: Secondary | ICD-10-CM | POA: Insufficient documentation

## 2014-04-30 DIAGNOSIS — R1310 Dysphagia, unspecified: Secondary | ICD-10-CM | POA: Diagnosis present

## 2014-04-30 LAB — COMPREHENSIVE METABOLIC PANEL
ALK PHOS: 61 U/L (ref 39–117)
ALT: 14 U/L (ref 0–35)
ANION GAP: 4 — AB (ref 5–15)
AST: 15 U/L (ref 0–37)
Albumin: 3.1 g/dL — ABNORMAL LOW (ref 3.5–5.2)
BILIRUBIN TOTAL: 0.7 mg/dL (ref 0.3–1.2)
BUN: 15 mg/dL (ref 6–23)
CO2: 31 mmol/L (ref 19–32)
Calcium: 9.2 mg/dL (ref 8.4–10.5)
Chloride: 107 mmol/L (ref 96–112)
Creatinine, Ser: 0.55 mg/dL (ref 0.50–1.10)
GFR calc Af Amer: >90 mL/min (ref >90)
GFR calc non Af Amer: 85 mL/min — ABNORMAL LOW (ref >90)
GLUCOSE: 106 mg/dL — AB (ref 70–99)
POTASSIUM: 3.7 mmol/L (ref 3.5–5.1)
Sodium: 142 mmol/L (ref 135–145)
Total Protein: 5.9 g/dL — ABNORMAL LOW (ref 6.0–8.3)

## 2014-04-30 LAB — CBC WITH DIFFERENTIAL/PLATELET
BASOS ABS: 0 10*3/uL (ref 0.0–0.1)
BASOS PCT: 0 % (ref 0–1)
Eosinophils Absolute: 2.1 10*3/uL — ABNORMAL HIGH (ref 0.0–0.7)
Eosinophils Relative: 24 % — ABNORMAL HIGH (ref 0–5)
HCT: 39.7 % (ref 36.0–46.0)
HEMOGLOBIN: 12.7 g/dL (ref 12.0–15.0)
Lymphocytes Relative: 15 % (ref 12–46)
Lymphs Abs: 1.3 10*3/uL (ref 0.7–4.0)
MCH: 29.1 pg (ref 26.0–34.0)
MCHC: 32 g/dL (ref 30.0–36.0)
MCV: 91.1 fL (ref 78.0–100.0)
MONOS PCT: 6 % (ref 3–12)
Monocytes Absolute: 0.5 10*3/uL (ref 0.1–1.0)
Neutro Abs: 4.9 10*3/uL (ref 1.7–7.7)
Neutrophils Relative %: 55 % (ref 43–77)
Platelets: 293 10*3/uL (ref 150–400)
RBC: 4.36 MIL/uL (ref 3.87–5.11)
RDW: 14.1 % (ref 11.5–15.5)
WBC: 8.8 10*3/uL (ref 4.0–10.5)

## 2014-04-30 MED ORDER — SODIUM CHLORIDE 0.9 % IV BOLUS (SEPSIS)
1500.0000 mL | Freq: Once | INTRAVENOUS | Status: AC
  Administered 2014-04-30: 1500 mL via INTRAVENOUS

## 2014-04-30 MED ORDER — PROPOFOL 10 MG/ML IV BOLUS
0.5000 mg/kg | Freq: Once | INTRAVENOUS | Status: AC
  Administered 2014-04-30: 30 mg via INTRAVENOUS
  Filled 2014-04-30: qty 1

## 2014-04-30 MED ORDER — IOHEXOL 300 MG/ML  SOLN
100.0000 mL | Freq: Once | INTRAMUSCULAR | Status: AC | PRN
  Administered 2014-04-30: 80 mL via INTRAVENOUS

## 2014-04-30 NOTE — Discharge Instructions (Signed)
Soft diet only!  Jaw Dislocation A jaw dislocation is the displacement of the joint where the upper jaw bone (maxilla) and the lower jaw bone (mandibula) meet (temporomandibular joint). Soon after the dislocation, the jaw muscles tighten. This prevents the mouth from closing normally.  CAUSES A jaw dislocation usually is caused by a sudden forceful impact to the jaw. A strong punch in the jaw during a fist fight or a sports injury are examples of causes of jaw dislocation. Another cause is injury due to car or motorcycle accidents. RISK FACTORS Although anyone can have a jaw dislocation, some people are more at risk than others. People at increased risk for jaw dislocation include participants in contact sports. SYMPTOMS Symptoms of jaw dislocation can vary, depending on the severity of the dislocation. They can include:  Feeling that your teeth are out of alignment when you bite.  Inability to close your mouth completely.  Drooling.  Extreme pain, with the inability to move your jaw. DIAGNOSIS Your caregiver will feel your temporomandibular joints and ask you to move your jaw. Your caregiver also will feel the inside of your mouth to make sure there are no fractures or cuts (lacerations). TREATMENT Your caregiver will manipulate your jaw to put it back into place (reduction). If you have any jaw fractures from the dislocation, they usually will be held in place with plates and screws or with wiring.  HOME CARE INSTRUCTIONS The following measures can help to reduce pain and hasten the healing process:  Rest your injured joint. Do not move it. Avoid activities similar to the one that caused your injury.  Apply ice to your injured joint for 1 to 2 days after your reduction or as directed by your caregiver. Applying ice helps to reduce inflammation and pain.  Put ice in a plastic bag.  Place a towel between your skin and the bag.  Leave the ice on for 15-20 minutes at a time, 03-04  times a day.  Take over-the-counter or prescription medication for pain as directed by your caregiver. Also, your caregiver may instruct you to only have certain foods until your jaw heals. These foods may be soft or liquified so that your jaw does not have to move much to eat them. SEEK IMMEDIATE MEDICAL CARE IF:  You have plates and screws or wiring to hold your jaw together that becomes loose or damaged.  You develop drainage from any of the cuts (incisions) where your wires or plates and screws were placed.  Your pain becomes worse rather than better. MAKE SURE YOU:  Understand these instructions.  Will watch your condition.  Will get help right away if you are not doing well or get worse. Document Released: 02/05/2000 Document Revised: 05/02/2011 Document Reviewed: 07/08/2010 [facility] Patient Information 2015 [place], [place]. This information is not intended to replace advice given to you by your health care provider. Make sure you discuss any questions you have with your health care provider.

## 2014-04-30 NOTE — ED Notes (Signed)
[facility] was called for pt's transportation back to [place].

## 2014-04-30 NOTE — ED Notes (Signed)
Per EMS: Pt from [facility].  Pt has had several weeks of dysphagia.  This started after starting risperdal.  Daughter originally told staff that they did not want her transported because she is a DNR.  Someone who is family but has not been in the picture recently showed up last night and wanted her to come to the ER because she was not how they remembered her.  Pt sleeping in stretcher at the moment.  NAD.

## 2014-04-30 NOTE — ED Provider Notes (Signed)
CSN: [id number]     Arrival date & time 04/30/14  1232 History   First MD Initiated Contact with Patient 04/30/14 1503     Chief Complaint  Patient presents with  . Dysphagia  . Psychiatric Evaluation     HPI Patient was sent to the emergency department for difficulty swallowing over the past several days.  She is thought to have swelling of her tongue and has had some difficulty swallowing.  Family is concerned that she is dehydrated.  She states she is unable to close her mouth completely.  She denies difficulty breathing or shortness of breath.  She denies significant jaw pain.  She denies trauma.  No other complaints.  She denies dental pain.  She denies swelling or redness or pain over her anterior neck.  Family reports that her speech is abnormal   Past Medical History  Diagnosis Date  . Tachycardia-bradycardia syndrome     s/p [employer] PPM implant in [place]  . Paroxysmal atrial fibrillation     chads2vasc score is at least 6.  She is not a candidate for anticoagulation due to falls.  Her AF burden is low.  . Hypertension   . Frequent falls   . Asthma   . Hyperlipidemia   . Depression   . Anxiety   . Pacemaker   . Stroke   . Osteoporosis 06/23/2013   Past Surgical History  Procedure Laterality Date  . Pacemaker insertion  01/04/2005    [place] PPM 1290 [id number]), GDT [phone] atrial lead and 4457 V lead all implanted in [place]  . Cholecystectomy    . Gallbladder surgery    . Tonsillectomy and adenoidectomy     Family History  Problem Relation Age of Onset  . Diabetes Mother   . High blood pressure Mother   . Heart Problems Father    History  Substance Use Topics  . Smoking status: Never Smoker   . Smokeless tobacco: Never Used  . Alcohol Use: No   OB History    No data available     Review of Systems  All other systems reviewed and are negative.     Allergies  Penicillins  Home Medications   Prior to Admission medications    Medication Sig Start Date End Date Taking? Authorizing Provider  acetaminophen (TYLENOL) 325 MG tablet Take 650 mg by mouth every 6 (six) hours as needed for mild pain or fever.   Yes Historical Provider, MD  acetaminophen (TYLENOL) 650 MG suppository Place 650 mg rectally every 6 (six) hours as needed for fever.   Yes Historical Provider, MD  albuterol (PROVENTIL HFA;VENTOLIN HFA) 108 (90 BASE) MCG/ACT inhaler Inhale 2 puffs into the lungs every 6 (six) hours as needed for wheezing.   Yes Historical Provider, MD  amLODipine (NORVASC) 10 MG tablet Take 10 mg by mouth at bedtime.   Yes Historical Provider, MD  aspirin 81 MG chewable tablet Chew 81 mg by mouth daily.   Yes Historical Provider, MD  Glycerin-Hypromellose-PEG 400 (VISINE TEARS OP) Apply 1 drop to eye 2 (two) times daily as needed (DRY EYES).   Yes Historical Provider, MD  hydrochlorothiazide (HYDRODIURIL) 25 MG tablet Take 1 tablet (25 mg total) by mouth daily. 07/07/13  Yes Gerlene Fee, NP  HYDROcodone-acetaminophen (NORCO/VICODIN) 5-325 MG per tablet Take one tablet by mouth every 6 hours as needed for mild pain; Take two tablets by mouth every 6 hours as needed for moderate to severe pain 06/17/13  Yes Tiffany L Reed, DO  isosorbide mononitrate (IMDUR) 30 MG 24 hr tablet Take 30 mg by mouth every morning.   Yes Historical Provider, MD  loratadine (CLARITIN) 10 MG tablet Take 10 mg by mouth daily as needed for allergies.   Yes Historical Provider, MD  LORazepam (ATIVAN) 0.5 MG tablet Take One and 1/2 tablet by mouth three times daily for anxiety 04/17/14  Yes Lauree Chandler, NP  losartan (COZAAR) 100 MG tablet Take 100 mg by mouth daily.   Yes Historical Provider, MD  metoprolol (LOPRESSOR) 50 MG tablet Take 50 mg by mouth 2 (two) times daily.   Yes Historical Provider, MD  montelukast (SINGULAIR) 10 MG tablet Take 10 mg by mouth at bedtime.   Yes Historical Provider, MD  nitroGLYCERIN (NITROSTAT) 0.4 MG SL tablet Place 0.4 mg  under the tongue every 5 (five) minutes as needed for chest pain.   Yes Historical Provider, MD  nystatin (MYCOSTATIN) 100000 UNIT/ML suspension Take 5 mLs by mouth 3 (three) times daily. For 1 week.   Yes Historical Provider, MD  potassium chloride 20 MEQ/15ML (10%) SOLN Take 20 mEq by mouth daily. Dilute with 4 to 8 ounces of liquid.   Yes Historical Provider, MD  simvastatin (ZOCOR) 40 MG tablet Take 40 mg by mouth daily.   Yes Historical Provider, MD  traMADol (ULTRAM) 50 MG tablet Take 50 mg by mouth every 6 (six) hours as needed for moderate pain.  04/04/13  Yes Tiffany L Reed, DO  alendronate (FOSAMAX) 70 MG tablet Take 70 mg by mouth once a week. Take with a full glass of water on an empty stomach.    Historical Provider, MD  calcium carbonate (CALCIUM 600) 600 MG TABS tablet Take 1 tablet (600 mg total) by mouth 2 (two) times daily with a meal. Patient not taking: Reported on 04/30/2014 06/23/13   Gerlene Fee, NP  Cholecalciferol (VITAMIN D) 2000 UNITS CAPS Take 2,000 Units by mouth daily.  06/23/13   Gerlene Fee, NP  fluticasone (FLOVENT HFA) 110 MCG/ACT inhaler Inhale 1 puff into the lungs daily.    Historical Provider, MD  lamoTRIgine (LAMICTAL) 100 MG tablet Take 100 mg by mouth daily.    Historical Provider, MD  RISPERIDONE PO (OTD)  Dissolve 1 tablet under the tongue or in the mouth daily at 1600. **DO NOT CRUSH OR CHEW**    Historical Provider, MD  theophylline (UNIPHYL) 400 MG 24 hr tablet Take 400 mg by mouth daily.    Historical Provider, MD   BP 190/80 mmHg  Pulse 67  Temp(Src) 98.9 F (37.2 C) (Rectal)  Resp 18  SpO2 95% Physical Exam  Constitutional: She is oriented to person, place, and time. She appears well-developed and well-nourished. No distress.  HENT:  Head: Normocephalic.  Unable to occlude teeth.  Possible mild swelling of the soft tissues under her tongue.  No erythema of her anterior neck.  No anterior neck swelling.  No obvious bruising of her face.  No  facial swelling.  Tolerating secretions.  Oral airway patent.  Posterior pharynx is normal.  Mucous membranes are dry  Eyes: EOM are normal.  Neck: Normal range of motion.  Cardiovascular: Normal rate, regular rhythm and normal heart sounds.   Pulmonary/Chest: Effort normal and breath sounds normal.  Abdominal: Soft. She exhibits no distension. There is no tenderness.  Musculoskeletal: Normal range of motion.  Neurological: She is alert and oriented to person, place, and time.  Skin: Skin is warm  and dry.  Psychiatric: She has a normal mood and affect. Judgment normal.  Nursing note and vitals reviewed.   ED Course  Procedures (including critical care time)  Procedural sedation Performed by: Hoy Morn Consent: Verbal consent obtained. Risks and benefits: risks, benefits and alternatives were discussed Required items: required blood products, implants, devices, and special equipment available Patient identity confirmed: arm band and provided demographic data Time out: Immediately prior to procedure a "time out" was called to verify the correct patient, procedure, equipment, support staff and site/side marked as required. Sedation type: moderate (conscious) sedation NPO time confirmed and considered Sedatives: PROPOFOL Physician Time at Bedside: 15 Vitals: Vital signs were monitored during sedation. Cardiac Monitor, pulse oximeter Patient tolerance: Patient tolerated the procedure well with no immediate complications. Comments: Pt with uneventful recovery. Returned to pre-procedural sedation baseline   Reduction of dislocation Performed by: Hoy Morn Consent: Verbal consent obtained. Risks and benefits: risks, benefits and alternatives were discussed Consent given by: patient Required items: required blood products, implants, devices, and special equipment available Time out: Immediately prior to procedure a "time out" was called to verify the correct patient, procedure,  equipment, support staff and site/side marked as required. Patient sedated: propofol Vitals: Vital signs were monitored during sedation. Patient tolerance: Patient tolerated the procedure well with no immediate complications. Joint: mandible Reduction technique: manipulation      Labs Review Labs Reviewed  CBC WITH DIFFERENTIAL/PLATELET - Abnormal; Notable for the following:    Eosinophils Relative 24 (*)    Eosinophils Absolute 2.1 (*)    All other components within normal limits  COMPREHENSIVE METABOLIC PANEL - Abnormal; Notable for the following:    Glucose, Bld 106 (*)    Total Protein 5.9 (*)    Albumin 3.1 (*)    GFR calc non Af Amer 85 (*)    Anion gap 4 (*)    All other components within normal limits    Imaging Review Dg Chest 2 View  04/30/2014   CLINICAL DATA:  Cough and difficulty swallowing for a week.  EXAM: CHEST  2 VIEW  COMPARISON:  PA and lateral chest 12/14/2012.  FINDINGS: The lungs are clear. Skin fold over the upper chest is noted. Heart size is normal with a pacing device in place. No pneumothorax or pleural effusion. Clips right upper quadrant of the abdomen are identified.  IMPRESSION: No acute disease.   Electronically Signed   By: Inge Rise M.D.   On: 04/30/2014 16:02   Ct Soft Tissue Neck W Contrast  04/30/2014   CLINICAL DATA:  Several week history of dysphagia.  EXAM: CT NECK WITH CONTRAST  TECHNIQUE: Multidetector CT imaging of the neck was performed using the standard protocol following the bolus administration of intravenous contrast.  CONTRAST:  71mL OMNIPAQUE IOHEXOL 300 MG/ML  SOLN  COMPARISON:  CT scan 08/17/2013  FINDINGS: Pharynx and larynx: No mass is identified.  No abscess.  Salivary glands: Unremarkable  Thyroid: Tiny nodules in the isthmus.  Lymph nodes: No lymphadenopathy. Small scattered lymph nodes bilaterally.  Vascular: Atherosclerotic calcifications involving the carotid bifurcations. No focal aneurysm or dissection.  Limited  intracranial: No obvious posterior fossa abnormality.  Visualized orbits: Not visualized.  Mastoids and visualized paranasal sinuses: Grossly clear.  Skeleton: Persistent bilateral dislocated mandibular condyles. Mild degenerative changes involving the cervical spine.  Upper chest: There is fairly marked wall thickening involving the upper esophagus. Could not exclude a esophageal mass. This begins at approximately the C7 level and continues down  to the T1-2 disc space.  The lung apices demonstrate emphysematous changes but no definite pulmonary lesions.  IMPRESSION: 1. No neck mass or adenopathy. 2. Chronically dislocated mandibular condyles bilaterally. 3. Upper esophageal wall thickening could be due to inflammation/infection or tumor.   Electronically Signed   By: Marijo Sanes M.D.   On: 04/30/2014 18:25  I personally reviewed the imaging tests through PACS system I reviewed available ER/hospitalization records through the EMR    EKG Interpretation None      MDM   Final diagnoses:  Jaw dislocation, initial encounter    Patient feels much better after reduction of her jaw dislocation.  It is read out as chronic jaw dislocation on CT scan however the patient reports that normally she can occlude her teeth.  After propofol sedation and jaw reduction she is able to occlude her teeth and her voice is returned to normal.  She states it feels much better.    Jola Schmidt, MD 05/01/14 [phone]

## 2014-04-30 NOTE — Progress Notes (Signed)
CSW met with pt at bedside. Daughter was present. Pt confirms that she is from [facility]. Daughter infomred CSW that she has been living at the facility for a little over a year.  Pt confirms that she presents to [facility] due to dysphagia. Daughter stated that the pt's tongue is swollen and that she has been having trouble swallowing. Daughter states that the pt's tongue became swollen due to a reaction from a medication.   Daughter also informed CSW that pt receives assistance with all ADL's.   Daughter informed CSW that she tries to visit the pt as often as she can. However, she states that she has doctor appointments too which prevent her. Daughter informed CSW that her son visits the pt whenever he has an afternoon off.  Pt is difficult to understand to unclear speech. Daughter informed CSW that the pt talks better when her tongue is kept wet.  Daughter states that she does not have any questions.  Willette Brace [phone] ED CSW 04/30/2014 6:50 PM

## 2014-05-05 ENCOUNTER — Non-Acute Institutional Stay (SKILLED_NURSING_FACILITY): Payer: Medicare Other | Admitting: Internal Medicine

## 2014-05-05 DIAGNOSIS — R1310 Dysphagia, unspecified: Secondary | ICD-10-CM | POA: Diagnosis not present

## 2014-05-05 DIAGNOSIS — F29 Unspecified psychosis not due to a substance or known physiological condition: Secondary | ICD-10-CM | POA: Diagnosis not present

## 2014-05-05 DIAGNOSIS — J45909 Unspecified asthma, uncomplicated: Secondary | ICD-10-CM | POA: Diagnosis not present

## 2014-05-05 DIAGNOSIS — G2401 Drug induced subacute dyskinesia: Secondary | ICD-10-CM

## 2014-05-05 DIAGNOSIS — K2289 Other specified disease of esophagus: Secondary | ICD-10-CM

## 2014-05-05 DIAGNOSIS — S030XXS Dislocation of jaw, sequela: Secondary | ICD-10-CM

## 2014-05-05 DIAGNOSIS — I10 Essential (primary) hypertension: Secondary | ICD-10-CM | POA: Diagnosis not present

## 2014-05-05 DIAGNOSIS — S0300XS Dislocation of jaw, unspecified side, sequela: Secondary | ICD-10-CM

## 2014-05-05 DIAGNOSIS — K228 Other specified diseases of esophagus: Secondary | ICD-10-CM | POA: Diagnosis not present

## 2014-05-05 DIAGNOSIS — M810 Age-related osteoporosis without current pathological fracture: Secondary | ICD-10-CM | POA: Diagnosis not present

## 2014-05-05 DIAGNOSIS — M436 Torticollis: Secondary | ICD-10-CM | POA: Diagnosis not present

## 2014-05-05 NOTE — Progress Notes (Signed)
Patient ID: Yvonne Buck, female   DOB: Nov 13, 1929, 81 y.o.   MRN: [id number]    Facility: [facility]  Chief complaint- Routine visit  Code status: DNR  Allergies reviewed- PCN  HPI:   81 y/o female patient is seen today for routine visit. She has been at her baseline today and is calm. She had developed tardive dyskinesia, was taken off her risperdal. She then had dysphagia and trouble with her speech. She was on dysphagia diet and was being followed by SLP team. Her daughter initially did not want her hospitalized and be treated in the facility. She was given cogentin. Her speech was slowly returning to normal and was taking po intake. She was treated with fluconazole for oral thrush. Later, her grandson wanted her sent to ED for further evaluation. There she was diagnosed to have bilateral jaw dislocation by ct scan (mentioned to be chronic). This was manually reduced following which improvement in her speech was noted. She mentions that both her speech and swallowing has improved. Denies any odynophagia or dysphagia. On review of ED imaging upper esophageal wall thickening noted.  She is on a geri chair. No recent falls reported.  Review of Systems:  Constitutional: Negative for fever, chills, diaphoresis.   Respiratory: Negative for cough, sputum production, shortness of breath and wheezing.    Cardiovascular: Negative for chest pain, palpitations Gastrointestinal: Negative for heartburn, nausea, vomiting, abdominal pain.   Genitourinary: Negative for dysuria Musculoskeletal: Negative for back pain,myalgias.   Skin: Negative for itching and rash.   Neurological:  Negative for dizziness and headaches.   Psychiatric/Behavioral: has memory loss  Past medical history reviewed    Medication List       This list is accurate as of: 05/05/14  5:13 PM.  Always use your most recent med list.               acetaminophen 325 MG tablet  Commonly  known as:  TYLENOL  Take 650 mg by mouth every 6 (six) hours as needed for mild pain or fever.     acetaminophen 650 MG suppository  Commonly known as:  TYLENOL  Place 650 mg rectally every 6 (six) hours as needed for fever.     albuterol 108 (90 BASE) MCG/ACT inhaler  Commonly known as:  PROVENTIL HFA;VENTOLIN HFA  Inhale 2 puffs into the lungs every 6 (six) hours as needed for wheezing.     amLODipine 10 MG tablet  Commonly known as:  NORVASC  Take 10 mg by mouth at bedtime.     aspirin 81 MG chewable tablet  Chew 81 mg by mouth daily.     calcium carbonate 600 MG Tabs tablet  Commonly known as:  CALCIUM 600  Take 1 tablet (600 mg total) by mouth 2 (two) times daily with a meal.     fluticasone 110 MCG/ACT inhaler  Commonly known as:  FLOVENT HFA  Inhale 1 puff into the lungs daily.     Fluticasone-Salmeterol 250-50 MCG/DOSE Aepb  Commonly known as:  ADVAIR  Inhale 1 puff into the lungs 2 (two) times daily.     hydrochlorothiazide 25 MG tablet  Commonly known as:  HYDRODIURIL  Take 1 tablet (25 mg total) by mouth daily.     HYDROcodone-acetaminophen 5-325 MG per tablet  Commonly known as:  NORCO/VICODIN  Take one tablet by mouth every 6 hours as needed for mild pain; Take two tablets by mouth every 6 hours as  needed for moderate to severe pain     isosorbide mononitrate 30 MG 24 hr tablet  Commonly known as:  IMDUR  Take 30 mg by mouth every morning.     lamoTRIgine 100 MG tablet  Commonly known as:  LAMICTAL  Take 100 mg by mouth daily.     loratadine 10 MG tablet  Commonly known as:  CLARITIN  Take 10 mg by mouth daily as needed for allergies.     LORazepam 0.5 MG tablet  Commonly known as:  ATIVAN  Take One and 1/2 tablet by mouth three times daily for anxiety     losartan 100 MG tablet  Commonly known as:  COZAAR  Take 100 mg by mouth daily.     metoprolol 50 MG tablet  Commonly known as:  LOPRESSOR  Take 50 mg by mouth 2 (two) times daily.      montelukast 10 MG tablet  Commonly known as:  SINGULAIR  Take 10 mg by mouth at bedtime.     nitroGLYCERIN 0.4 MG SL tablet  Commonly known as:  NITROSTAT  Place 0.4 mg under the tongue every 5 (five) minutes as needed for chest pain.     nystatin 100000 UNIT/ML suspension  Commonly known as:  MYCOSTATIN  Take 5 mLs by mouth 3 (three) times daily. For 1 week.     potassium chloride 20 MEQ/15ML (10%) Soln  Take 20 mEq by mouth daily. Dilute with 4 to 8 ounces of liquid.     simvastatin 40 MG tablet  Commonly known as:  ZOCOR  Take 40 mg by mouth daily.     theophylline 400 MG 24 hr tablet  Commonly known as:  UNIPHYL  Take 400 mg by mouth daily.     traMADol 50 MG tablet  Commonly known as:  ULTRAM  Take 50 mg by mouth every 6 (six) hours as needed for moderate pain.     VISINE TEARS OP  Apply 1 drop to eye 2 (two) times daily as needed (DRY EYES).     Vitamin D 2000 UNITS Caps  Take 2,000 Units by mouth daily.       Physical exam BP 139/68 mmHg  Pulse 72  Temp(Src) 98.1 F (36.7 C)  Resp 18  SpO2 95%  Constitutional: frail and in no acute distress.   Neck: Neck supple. No JVD present.   Nose: no sinus tenderness Mouth: poor dentition, able to open and close her mouth without problem Cardiovascular: Normal rate, regular rhythm and intact distal pulses.    Respiratory: Effort normal and breath sounds normal. No respiratory distress. She has no wheezes.   GI: Soft. Bowel sounds are normal. She exhibits no distension. There is no tenderness.  Musculoskeletal: able to move all 4 extremities, slight tilting of her neck to right side, no neck muscle spasm, decent rom at neck  Neurological: She is alert, oriented to person and place Skin: Skin is warm and dry. She is not diaphoretic.  Psychiatric: poor insight and judgement  Labs 10/14/13 wbc 8.8, hb 14.2, hct 45.8, plt 324 10/15/13 na 140, k 3.6, bun 20, cr 0.7, glu 102, ca 10.1, tsh 0.768 10/22/13 lft wnl 12/1/15wbc  7.4, hb 13, hct 41.1, plt 266, na 141, k 3.7, bun 15, cr 0.6, glu 98, t.chol 143, tg 83, ldl 73, hdl 53, tsh 0.85 05/05/14 wbc 7.9, hb 12.1, eosinophil 26, na 142, k 3.8, bun 20, cr 0.64, ca 9.1  Lab Results  Component Value Date  WBC 8.8 04/30/2014   HGB 12.7 04/30/2014   HCT 39.7 04/30/2014   MCV 91.1 04/30/2014   PLT 293 04/30/2014   CMP     Component Value Date/Time   NA 142 04/30/2014 1548   NA 140 10/15/2013   K 3.7 04/30/2014 1548   CL 107 04/30/2014 1548   CO2 31 04/30/2014 1548   GLUCOSE 106* 04/30/2014 1548   BUN 15 04/30/2014 1548   BUN 20 10/15/2013   CREATININE 0.55 04/30/2014 1548   CREATININE 0.7 10/15/2013   CREATININE 0.68 12/10/2010 1018   CALCIUM 9.2 04/30/2014 1548   PROT 5.9* 04/30/2014 1548   ALBUMIN 3.1* 04/30/2014 1548   AST 15 04/30/2014 1548   ALT 14 04/30/2014 1548   ALKPHOS 61 04/30/2014 1548   BILITOT 0.7 04/30/2014 1548   GFRNONAA 85* 04/30/2014 1548   GFRAA >90 04/30/2014 1548   04/30/14 ct soft tissue of neck IMPRESSION: 1. No neck mass or adenopathy. 2. Chronically dislocated mandibular condyles bilaterally. 3. Upper esophageal wall thickening could be due to inflammation/infection or tumor.    Assessment/plan  Tardive dyskinesia likely from her antipsychotics. To D/c risperdal, bzd and lamictal for now. Monitor clinically for now  Dislocation of jaw Manually reduced. Currently asymptomatic. On review of CT scan, had chronic dislocation but pt was asymptomatic. Unclear about the etiology of dislocation, pending ENT consult after review of goals of care with family  Dysphagia Improving, followed by SLP team. Concern for esophageal mucosal thickening form possible mass contributing to this.see below. Aspiration precuations and assistance with feeding if needed  Esophageal wall thickening Unclear etiology. Her oral thrush has resolved. Possible tumor or infection. Consider gi referral vs barium swallow study after review of goals of  care with family  Osteoporosis On fosamax, with recent dysphagia and ? mucosal thickening of esophagus on ct scan hold off on fosamax for now. Fall precautions. Continue calcium tid and vitamin d daily  HTN Stable, continue cozaar 100 mg daily, hctz 12.5 mg daily, imdur 30 mg daily, amlodipine 10 mg daily and lopressor 50 mg bid  Asthma Stable on theophylline, singulair, advair. Theophylline level subtherapeutic. On 400 mg daily. Theophylline can cause CNS side effects like irritability. Her asthma has been stable with no recent exacerbation. This has been a long term medication for her but would like to take her off this with side effect profile. 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 No cervical tenderness, no paraspinal tenderness, monitor clinically for now. Get xray of cervical spine and if normal, get PT

## 2014-05-09 ENCOUNTER — Ambulatory Visit (INDEPENDENT_AMBULATORY_CARE_PROVIDER_SITE_OTHER): Payer: Medicare Other | Admitting: Gastroenterology

## 2014-05-09 ENCOUNTER — Encounter: Payer: Self-pay | Admitting: Gastroenterology

## 2014-05-09 VITALS — BP 118/60 | HR 61 | Ht 63.0 in

## 2014-05-09 DIAGNOSIS — R933 Abnormal findings on diagnostic imaging of other parts of digestive tract: Secondary | ICD-10-CM | POA: Diagnosis not present

## 2014-05-09 DIAGNOSIS — R1314 Dysphagia, pharyngoesophageal phase: Secondary | ICD-10-CM | POA: Diagnosis not present

## 2014-05-09 NOTE — Patient Instructions (Signed)
You have been scheduled for a Barium Esophogram at [facility] (1st floor of the hospital) on 05/15/14 at 10:00am. Please arrive 15 minutes prior to your appointment for registration. Make certain not to have anything to eat or drink 6 hours prior to your test. If you need to reschedule for any reason, please contact radiology at [phone] to do so. __________________________________________________________________ A barium swallow is an examination that concentrates on views of the esophagus. This tends to be a double contrast exam (barium and two liquids which, when combined, create a gas to distend the wall of the oesophagus) or single contrast (non-ionic iodine based). The study is usually tailored to your symptoms so a good history is essential. Attention is paid during the study to the form, structure and configuration of the esophagus, looking for functional disorders (such as aspiration, dysphagia, achalasia, motility and reflux) EXAMINATION You may be asked to change into a gown, depending on the type of swallow being performed. A radiologist and radiographer will perform the procedure. The radiologist will advise you of the type of contrast selected for your procedure and direct you during the exam. You will be asked to stand, sit or lie in several different positions and to hold a small amount of fluid in your mouth before being asked to swallow while the imaging is performed .In some instances you may be asked to swallow barium coated marshmallows to assess the motility of a solid food bolus. The exam can be recorded as a digital or video fluoroscopy procedure. POST PROCEDURE It will take 1-2 days for the barium to pass through your system. To facilitate this, it is important, unless otherwise directed, to increase your fluids for the next 24-48hrs and to resume your normal diet.  This test typically takes about 30 minutes to  perform. __________________________________________________________________________________  Thank you for choosing me and [facility].  Pricilla Riffle. Dagoberto Ligas., MD., Marval Regal  cc: Blanchie Serve, MD

## 2014-05-09 NOTE — Progress Notes (Signed)
History of Present Illness: This is an 81 year old female who was referred by Blanchie Serve, MD for the evaluation of an abnormal CT of the esophagus. The patient is accompanied by her daughter. She has a history of severe TMJ with frequent jaw dislocations. She was recently sent to the emergency department for difficulty swallowing and a dislocated jaw. Her dislocated jaw was relocated. Her dysphagia has improved over the past 1-2 days. She localizes her dysphasia symptoms to her upper and mid neck. A CT scan of the neck showed upper esophageal wall thickening and esophageal inflammation or tumor could not be excluded. She denies chronic GERD with chronic dysphagia symptoms. Her dysphagia began acutely surrounding the time of her jaw dislocation and relocation. She is significantly debilitated and uses a wheelchair due to weakness and frequent falls. Denies weight loss, abdominal pain, constipation, diarrhea, change in stool caliber, melena, hematochezia, nausea, vomiting, reflux symptoms, chest pain.  Allergies  Allergen Reactions  . Penicillins Other (See Comments)    Red spots ### Tolerated Rocephin 12/2012 ###   Outpatient Prescriptions Prior to Visit  Medication Sig Dispense Refill  . acetaminophen (TYLENOL) 325 MG tablet Take 650 mg by mouth every 6 (six) hours as needed for mild pain or fever.    Marland Kitchen acetaminophen (TYLENOL) 650 MG suppository Place 650 mg rectally every 6 (six) hours as needed for fever.    Marland Kitchen albuterol (PROVENTIL HFA;VENTOLIN HFA) 108 (90 BASE) MCG/ACT inhaler Inhale 2 puffs into the lungs every 6 (six) hours as needed for wheezing.    Marland Kitchen amLODipine (NORVASC) 10 MG tablet Take 10 mg by mouth at bedtime.    Marland Kitchen aspirin 81 MG chewable tablet Chew 81 mg by mouth daily.    . calcium carbonate (CALCIUM 600) 600 MG TABS tablet Take 1 tablet (600 mg total) by mouth 2 (two) times daily with a meal. 60 tablet 11  . Cholecalciferol (VITAMIN D) 2000 UNITS CAPS Take 2,000 Units by  mouth daily.     . fluticasone (FLOVENT HFA) 110 MCG/ACT inhaler Inhale 1 puff into the lungs daily.    . Fluticasone-Salmeterol (ADVAIR) 250-50 MCG/DOSE AEPB Inhale 1 puff into the lungs 2 (two) times daily.    . Glycerin-Hypromellose-PEG 400 (VISINE TEARS OP) Apply 1 drop to eye 2 (two) times daily as needed (DRY EYES).    . hydrochlorothiazide (HYDRODIURIL) 25 MG tablet Take 1 tablet (25 mg total) by mouth daily. (Patient taking differently: Take 12.5 mg by mouth daily. ) 90 tablet 3  . HYDROcodone-acetaminophen (NORCO/VICODIN) 5-325 MG per tablet Take one tablet by mouth every 6 hours as needed for mild pain; Take two tablets by mouth every 6 hours as needed for moderate to severe pain 240 tablet 0  . isosorbide mononitrate (IMDUR) 30 MG 24 hr tablet Take 30 mg by mouth every morning.    . lamoTRIgine (LAMICTAL) 100 MG tablet Take 100 mg by mouth daily.    Marland Kitchen loratadine (CLARITIN) 10 MG tablet Take 10 mg by mouth daily as needed for allergies.    Marland Kitchen LORazepam (ATIVAN) 0.5 MG tablet Take One and 1/2 tablet by mouth three times daily for anxiety 135 tablet 5  . losartan (COZAAR) 100 MG tablet Take 100 mg by mouth daily.    . metoprolol (LOPRESSOR) 50 MG tablet Take 50 mg by mouth 2 (two) times daily.    . montelukast (SINGULAIR) 10 MG tablet Take 10 mg by mouth at bedtime.    . nitroGLYCERIN (NITROSTAT) 0.4  MG SL tablet Place 0.4 mg under the tongue every 5 (five) minutes as needed for chest pain.    Marland Kitchen nystatin (MYCOSTATIN) 100000 UNIT/ML suspension Take 5 mLs by mouth 3 (three) times daily. For 1 week.    . potassium chloride 20 MEQ/15ML (10%) SOLN Take 20 mEq by mouth daily. Dilute with 4 to 8 ounces of liquid.    . simvastatin (ZOCOR) 40 MG tablet Take 40 mg by mouth daily.    . theophylline (UNIPHYL) 400 MG 24 hr tablet Take 400 mg by mouth daily.    . traMADol (ULTRAM) 50 MG tablet Take 50 mg by mouth every 6 (six) hours as needed for moderate pain.      No facility-administered medications  prior to visit.   Past Medical History  Diagnosis Date  . Tachycardia-bradycardia syndrome     s/p [employer] PPM implant in [place]  . Paroxysmal atrial fibrillation     chads2vasc score is at least 6.  She is not a candidate for anticoagulation due to falls.  Her AF burden is low.  . Hypertension   . Frequent falls   . Asthma   . Hyperlipidemia   . Depression   . Anxiety   . Pacemaker   . Stroke   . Osteoporosis 06/23/2013  . Vitamin D deficiency   . Sick sinus syndrome   . Hypothyroidism   . Chronic kidney disease    Past Surgical History  Procedure Laterality Date  . Pacemaker insertion  01/04/2005    [place] PPM 1290 [phone]), GDT [phone] atrial lead and 4457 V lead all implanted in [place]  . Cholecystectomy    . Gallbladder surgery    . Tonsillectomy and adenoidectomy     History   Social History  . Marital Status: Widowed    Spouse Name: N/A  . Number of Children: 2  . Years of Education: college   Occupational History  .      retired   Social History Main Topics  . Smoking status: Never Smoker   . Smokeless tobacco: Never Used  . Alcohol Use: No  . Drug Use: No  . Sexual Activity: No   Other Topics Concern  . None   Social History Narrative   Recently moved to [place] to live with her daughter.  Previously lived in [place].     Daughter . [name].     Patient has some college education.   Right handed   Caffeine- None   Family History  Problem Relation Age of Onset  . Diabetes Mother   . High blood pressure Mother   . Heart Problems Father      Review of Systems: Pertinent positive and negative review of systems were noted in the above HPI section. All other review of systems were otherwise negative.   Physical Exam: General: Well developed, well nourished, chronically ill, frail, in a wheelchair, no acute distress Head: Normocephalic and atraumatic Eyes:  sclerae anicteric, EOMI Ears: Normal  auditory acuity Mouth: No deformity or lesions. Unable to widely open her mouth due to fear of jaw dislocation Neck: Supple, no masses or thyromegaly Lungs: Clear throughout to auscultation Heart: Regular rate and rhythm; no murmurs, rubs or bruits Abdomen: Soft, non tender and non distended. No masses, hepatosplenomegaly or hernias noted. Normal Bowel sounds Musculoskeletal: Symmetrical with no gross deformities  Skin: No lesions on visible extremities Pulses:  Normal pulses noted Extremities: No clubbing, cyanosis, edema or deformities noted  Neurological: Alert oriented x 4, grossly nonfocal Cervical Nodes:  No significant cervical adenopathy Inguinal Nodes: No significant inguinal adenopathy Psychological:  Alert and cooperative. Normal mood and affect  Assessment and Recommendations:  1. Acute dysphagia localized to upper and mid neck which has started to resolve. Symptoms correlate with jaw dislocation and jaw relocation and do not appear to be esophageal or GI related..  2. Thickened upper esophagus on CT scan of the neck. This is likely an artifact however esopahgeal inflammation/neoplasm cannot be definitely excluded. They are unlikely as she has no chronic reflux symptoms or chronic dysphagia symptoms. She is at high risk for repeat TMJ/jaw dislocation with adequately opening her mouth and positioning for endoscopy. Schedule barium esophagram to further evaluate.   cc: Blanchie Serve, MD [address] [place], [place] [address]

## 2014-05-15 ENCOUNTER — Ambulatory Visit (HOSPITAL_COMMUNITY)
Admission: RE | Admit: 2014-05-15 | Discharge: 2014-05-15 | Disposition: A | Discharge status: Home / Self Care | Payer: Medicare Other | Source: Ambulatory Visit | Attending: Gastroenterology | Admitting: Gastroenterology

## 2014-05-15 ENCOUNTER — Encounter (HOSPITAL_COMMUNITY): Payer: Self-pay | Admitting: *Deleted

## 2014-05-15 ENCOUNTER — Ambulatory Visit (HOSPITAL_COMMUNITY): Payer: Medicare Other

## 2014-05-15 ENCOUNTER — Emergency Department (HOSPITAL_COMMUNITY): Payer: Medicare Other

## 2014-05-15 ENCOUNTER — Emergency Department (HOSPITAL_COMMUNITY)
Admission: EM | Admit: 2014-05-15 | Discharge: 2014-05-15 | Disposition: A | Discharge status: Home / Self Care | Payer: Medicare Other | Attending: Emergency Medicine | Admitting: Emergency Medicine

## 2014-05-15 DIAGNOSIS — F329 Major depressive disorder, single episode, unspecified: Secondary | ICD-10-CM | POA: Diagnosis not present

## 2014-05-15 DIAGNOSIS — Y9389 Activity, other specified: Secondary | ICD-10-CM | POA: Insufficient documentation

## 2014-05-15 DIAGNOSIS — Z7982 Long term (current) use of aspirin: Secondary | ICD-10-CM | POA: Diagnosis not present

## 2014-05-15 DIAGNOSIS — W1839XA Other fall on same level, initial encounter: Secondary | ICD-10-CM | POA: Diagnosis not present

## 2014-05-15 DIAGNOSIS — Z950 Presence of cardiac pacemaker: Secondary | ICD-10-CM | POA: Diagnosis not present

## 2014-05-15 DIAGNOSIS — Z9181 History of falling: Secondary | ICD-10-CM | POA: Insufficient documentation

## 2014-05-15 DIAGNOSIS — Y998 Other external cause status: Secondary | ICD-10-CM | POA: Insufficient documentation

## 2014-05-15 DIAGNOSIS — Y9289 Other specified places as the place of occurrence of the external cause: Secondary | ICD-10-CM | POA: Insufficient documentation

## 2014-05-15 DIAGNOSIS — I129 Hypertensive chronic kidney disease with stage 1 through stage 4 chronic kidney disease, or unspecified chronic kidney disease: Secondary | ICD-10-CM | POA: Insufficient documentation

## 2014-05-15 DIAGNOSIS — J45909 Unspecified asthma, uncomplicated: Secondary | ICD-10-CM | POA: Diagnosis not present

## 2014-05-15 DIAGNOSIS — N189 Chronic kidney disease, unspecified: Secondary | ICD-10-CM | POA: Diagnosis not present

## 2014-05-15 DIAGNOSIS — E785 Hyperlipidemia, unspecified: Secondary | ICD-10-CM | POA: Diagnosis not present

## 2014-05-15 DIAGNOSIS — Z880 Allergy status to penicillin: Secondary | ICD-10-CM | POA: Insufficient documentation

## 2014-05-15 DIAGNOSIS — Z8673 Personal history of transient ischemic attack (TIA), and cerebral infarction without residual deficits: Secondary | ICD-10-CM | POA: Diagnosis not present

## 2014-05-15 DIAGNOSIS — Z79899 Other long term (current) drug therapy: Secondary | ICD-10-CM | POA: Diagnosis not present

## 2014-05-15 DIAGNOSIS — S79912A Unspecified injury of left hip, initial encounter: Secondary | ICD-10-CM | POA: Insufficient documentation

## 2014-05-15 DIAGNOSIS — W19XXXA Unspecified fall, initial encounter: Secondary | ICD-10-CM

## 2014-05-15 DIAGNOSIS — R1314 Dysphagia, pharyngoesophageal phase: Secondary | ICD-10-CM

## 2014-05-15 DIAGNOSIS — F419 Anxiety disorder, unspecified: Secondary | ICD-10-CM | POA: Insufficient documentation

## 2014-05-15 DIAGNOSIS — R933 Abnormal findings on diagnostic imaging of other parts of digestive tract: Secondary | ICD-10-CM

## 2014-05-15 DIAGNOSIS — Z8639 Personal history of other endocrine, nutritional and metabolic disease: Secondary | ICD-10-CM | POA: Diagnosis not present

## 2014-05-15 DIAGNOSIS — M25552 Pain in left hip: Secondary | ICD-10-CM

## 2014-05-15 NOTE — Discharge Instructions (Signed)
Fall Prevention and Home Safety Falls cause injuries and can affect all age groups. It is possible to use preventive measures to significantly decrease the likelihood of falls. There are many simple measures which can make your home safer and prevent falls. OUTDOORS  Repair cracks and edges of walkways and driveways.  Remove high doorway thresholds.  Trim shrubbery on the main path into your home.  Have good outside lighting.  Clear walkways of tools, rocks, debris, and clutter.  Check that handrails are not broken and are securely fastened. Both sides of steps should have handrails.  Have leaves, snow, and ice cleared regularly.  Use sand or salt on walkways during winter months.  In the garage, clean up grease or oil spills. BATHROOM  Install night lights.  Install grab bars by the toilet and in the tub and shower.  Use non-skid mats or decals in the tub or shower.  Place a plastic non-slip stool in the shower to sit on, if needed.  Keep floors dry and clean up all water on the floor immediately.  Remove soap buildup in the tub or shower on a regular basis.  Secure bath mats with non-slip, double-sided rug tape.  Remove throw rugs and tripping hazards from the floors. BEDROOMS  Install night lights.  Make sure a bedside light is easy to reach.  Do not use oversized bedding.  Keep a telephone by your bedside.  Have a firm chair with side arms to use for getting dressed.  Remove throw rugs and tripping hazards from the floor. KITCHEN  Keep handles on pots and pans turned toward the center of the stove. Use back burners when possible.  Clean up spills quickly and allow time for drying.  Avoid walking on wet floors.  Avoid hot utensils and knives.  Position shelves so they are not too high or low.  Place commonly used objects within easy reach.  If necessary, use a sturdy step stool with a grab bar when reaching.  Keep electrical cables out of the  way.  Do not use floor polish or wax that makes floors slippery. If you must use wax, use non-skid floor wax.  Remove throw rugs and tripping hazards from the floor. STAIRWAYS  Never leave objects on stairs.  Place handrails on both sides of stairways and use them. Fix any loose handrails. Make sure handrails on both sides of the stairways are as long as the stairs.  Check carpeting to make sure it is firmly attached along stairs. Make repairs to worn or loose carpet promptly.  Avoid placing throw rugs at the top or bottom of stairways, or properly secure the rug with carpet tape to prevent slippage. Get rid of throw rugs, if possible.  Have an electrician put in a light switch at the top and bottom of the stairs. OTHER FALL PREVENTION TIPS  Wear low-heel or rubber-soled shoes that are supportive and fit well. Wear closed toe shoes.  When using a stepladder, make sure it is fully opened and both spreaders are firmly locked. Do not climb a closed stepladder.  Add color or contrast paint or tape to grab bars and handrails in your home. Place contrasting color strips on first and last steps.  Learn and use mobility aids as needed. Install an electrical emergency response system.  Turn on lights to avoid dark areas. Replace light bulbs that burn out immediately. Get light switches that glow.  Arrange furniture to create clear pathways. Keep furniture in the same place.  Firmly attach carpet with non-skid or double-sided tape.  Eliminate uneven floor surfaces.  Select a carpet pattern that does not visually hide the edge of steps.  Be aware of all pets. OTHER HOME SAFETY TIPS  Set the water temperature for 120 F (48.8 C).  Keep emergency numbers on or near the telephone.  Keep smoke detectors on every level of the home and near sleeping areas. Document Released: 01/28/2002 Document Revised: 08/09/2011 Document Reviewed: 04/29/2011 [facility] Patient Information 2015  [place], [place]. This information is not intended to replace advice given to you by your health care provider. Make sure you discuss any questions you have with your health care provider. Musculoskeletal Pain Musculoskeletal pain is muscle and boney aches and pains. These pains can occur in any part of the body. Your caregiver may treat you without knowing the cause of the pain. They may treat you if blood or urine tests, X-rays, and other tests were normal.  CAUSES There is often not a definite cause or reason for these pains. These pains may be caused by a type of germ (virus). The discomfort may also come from overuse. Overuse includes working out too hard when your body is not fit. Boney aches also come from weather changes. Bone is sensitive to atmospheric pressure changes. HOME CARE INSTRUCTIONS   Ask when your test results will be ready. Make sure you get your test results.  Only take over-the-counter or prescription medicines for pain, discomfort, or fever as directed by your caregiver. If you were given medications for your condition, do not drive, operate machinery or power tools, or sign legal documents for 24 hours. Do not drink alcohol. Do not take sleeping pills or other medications that may interfere with treatment.  Continue all activities unless the activities cause more pain. When the pain lessens, slowly resume normal activities. Gradually increase the intensity and duration of the activities or exercise.  During periods of severe pain, bed rest may be helpful. Lay or sit in any position that is comfortable.  Putting ice on the injured area.  Put ice in a bag.  Place a towel between your skin and the bag.  Leave the ice on for 15 to 20 minutes, 3 to 4 times a day.  Follow up with your caregiver for continued problems and no reason can be found for the pain. If the pain becomes worse or does not go away, it may be necessary to repeat tests or do additional testing. Your  caregiver may need to look further for a possible cause. SEEK IMMEDIATE MEDICAL CARE IF:  You have pain that is getting worse and is not relieved by medications.  You develop chest pain that is associated with shortness or breath, sweating, feeling sick to your stomach (nauseous), or throw up (vomit).  Your pain becomes localized to the abdomen.  You develop any new symptoms that seem different or that concern you. MAKE SURE YOU:   Understand these instructions.  Will watch your condition.  Will get help right away if you are not doing well or get worse. Document Released: 02/07/2005 Document Revised: 05/02/2011 Document Reviewed: 10/12/2012 [facility] Patient Information 2015 [place], [place]. This information is not intended to replace advice given to you by your health care provider. Make sure you discuss any questions you have with your health care provider.

## 2014-05-15 NOTE — ED Notes (Signed)
Bed: [facility]D Expected date:  Expected time:  Means of arrival:  Comments: 

## 2014-05-15 NOTE — ED Notes (Signed)
Pt back from x-ray.

## 2014-05-15 NOTE — ED Notes (Addendum)
Pt from [facility], [employer], pt came to get an esophagus test, witnessed fall by radiology staff,  pt stood up out of wheel chair, fell backward, landed on her hip. No LOC.  Pt hx of stroke, poor historian, reports left leg pain. "hurts a little".   [facility] [id number]  [place] [id number]  [phone] [employer] for pt.

## 2014-05-15 NOTE — ED Provider Notes (Signed)
CSN: [id number]     Arrival date & time 05/15/14  1047 History   First MD Initiated Contact with Patient 05/15/14 1049     Chief Complaint  Patient presents with  . Fall   Yvonne Buck is a 81 y.o. female  With a history of a CVA, chronic kidney disease, atrial fibrillation, pacemaker, frequent falls who presents the emergency department after falling out of her wheelchair today. Patient was in the hospital for an esophageal x-ray when radiology staff report she fell while getting out of her wheelchair. Patient reports she was standing up when she fell on her left side. She is complaining of left hip pain initially. During my interview she denies any pain. Radiology staff report she fell onto her left side and did not hit her head or lose consciousness. She reports feeling slightly dizzy now, but denies feeling dizzy prior to her fall. She denies any chest pain, palpitations or shortness of breath.  She denies fevers, abdominal pain, nausea, vomiting, headache, head pain, lightheadedness, numbness, tingling, back pain or neck pain.  (Consider location/radiation/quality/duration/timing/severity/associated sxs/prior Treatment) HPI  Past Medical History  Diagnosis Date  . Tachycardia-bradycardia syndrome     s/p [employer] PPM implant in [place]  . Paroxysmal atrial fibrillation     chads2vasc score is at least 6.  She is not a candidate for anticoagulation due to falls.  Her AF burden is low.  . Hypertension   . Frequent falls   . Asthma   . Hyperlipidemia   . Depression   . Anxiety   . Pacemaker   . Stroke   . Osteoporosis 06/23/2013  . Vitamin D deficiency   . Sick sinus syndrome   . Hypothyroidism   . Chronic kidney disease    Past Surgical History  Procedure Laterality Date  . Pacemaker insertion  01/04/2005    [place] PPM 1290 [id number]), GDT [phone] atrial lead and 4457 V lead all implanted in [place]  . Cholecystectomy    . Gallbladder surgery    .  Tonsillectomy and adenoidectomy     Family History  Problem Relation Age of Onset  . Diabetes Mother   . High blood pressure Mother   . Heart Problems Father    History  Substance Use Topics  . Smoking status: Never Smoker   . Smokeless tobacco: Never Used  . Alcohol Use: No   OB History    No data available     Review of Systems  Constitutional: Negative for fever and chills.  HENT: Negative for congestion and sore throat.   Eyes: Negative for visual disturbance.  Respiratory: Negative for cough, shortness of breath and wheezing.   Cardiovascular: Negative for chest pain and palpitations.  Gastrointestinal: Negative for nausea, vomiting, abdominal pain and diarrhea.  Genitourinary: Negative for dysuria.  Musculoskeletal: Negative for back pain and neck pain.       Left hip pain  Skin: Negative for rash and wound.  Neurological: Positive for dizziness. Negative for facial asymmetry, weakness, light-headedness, numbness and headaches.      Allergies  Penicillins  Home Medications   Prior to Admission medications   Medication Sig Start Date End Date Taking? Authorizing Provider  acetaminophen (TYLENOL) 325 MG tablet Take 650 mg by mouth every 6 (six) hours as needed for mild pain or fever.   Yes Historical Provider, MD  acetaminophen (TYLENOL) 650 MG suppository Place 650 mg rectally every 6 (six) hours as needed for fever.   Yes  Historical Provider, MD  albuterol (PROVENTIL HFA;VENTOLIN HFA) 108 (90 BASE) MCG/ACT inhaler Inhale 2 puffs into the lungs every 6 (six) hours as needed for wheezing.   Yes Historical Provider, MD  amLODipine (NORVASC) 10 MG tablet Take 10 mg by mouth at bedtime.   Yes Historical Provider, MD  aspirin 81 MG chewable tablet Chew 81 mg by mouth daily.   Yes Historical Provider, MD  benztropine mesylate (COGENTIN) 1 MG/ML injection Inject 0.5 mg into the muscle once. 04/23/14  Yes Historical Provider, MD  Fluticasone-Salmeterol (ADVAIR) 250-50  MCG/DOSE AEPB Inhale 1 puff into the lungs 2 (two) times daily.   Yes Historical Provider, MD  Glycerin-Hypromellose-PEG 400 (VISINE TEARS OP) Apply 1 drop to eye 2 (two) times daily as needed (DRY EYES).   Yes Historical Provider, MD  hyaluronidase Human (HYLENEX) 150 UNIT/ML SOLN injection Inject 150 Units into the skin once.   Yes Historical Provider, MD  HYDROcodone-acetaminophen (NORCO/VICODIN) 5-325 MG per tablet Take one tablet by mouth every 6 hours as needed for mild pain; Take two tablets by mouth every 6 hours as needed for moderate to severe pain 06/17/13  Yes Tiffany L Reed, DO  isosorbide mononitrate (IMDUR) 30 MG 24 hr tablet Take 30 mg by mouth every morning.   Yes Historical Provider, MD  loratadine (CLARITIN) 10 MG tablet Take 10 mg by mouth daily as needed for allergies.   Yes Historical Provider, MD  losartan (COZAAR) 100 MG tablet Take 100 mg by mouth daily.   Yes Historical Provider, MD  metoprolol (LOPRESSOR) 50 MG tablet Take 50 mg by mouth 2 (two) times daily.   Yes Historical Provider, MD  montelukast (SINGULAIR) 10 MG tablet Take 10 mg by mouth at bedtime.   Yes Historical Provider, MD  nitroGLYCERIN (NITROSTAT) 0.4 MG SL tablet Place 0.4 mg under the tongue every 5 (five) minutes as needed for chest pain.   Yes Historical Provider, MD  potassium chloride 20 MEQ/15ML (10%) SOLN Take 20 mEq by mouth daily. Dilute with 4 to 8 ounces of liquid.   Yes Historical Provider, MD  simvastatin (ZOCOR) 40 MG tablet Take 40 mg by mouth daily.   Yes Historical Provider, MD  traMADol (ULTRAM) 50 MG tablet Take 50 mg by mouth every 6 (six) hours as needed for moderate pain.  04/04/13  Yes Tiffany L Reed, DO  calcium carbonate (CALCIUM 600) 600 MG TABS tablet Take 1 tablet (600 mg total) by mouth 2 (two) times daily with a meal. 06/23/13   Gerlene Fee, NP  hydrochlorothiazide (HYDRODIURIL) 25 MG tablet Take 1 tablet (25 mg total) by mouth daily. Patient not taking: Reported on 05/15/2014  07/07/13   Gerlene Fee, NP  LORazepam (ATIVAN) 0.5 MG tablet Take One and 1/2 tablet by mouth three times daily for anxiety 04/17/14   Lauree Chandler, NP   BP 113/50 mmHg  Pulse 59  Temp(Src) 98 F (36.7 C) (Oral)  Resp 21  SpO2 93% Physical Exam  Constitutional: She is oriented to person, place, and time. She appears well-developed and well-nourished. No distress.  HENT:  Head: Normocephalic and atraumatic.  Mouth/Throat: Oropharynx is clear and moist.  Eyes: Conjunctivae are normal. Pupils are equal, round, and reactive to light. Right eye exhibits no discharge. Left eye exhibits no discharge.  Neck: Normal range of motion. Neck supple. No JVD present. No tracheal deviation present.  Cardiovascular: Normal rate, regular rhythm, normal heart sounds and intact distal pulses.  Exam reveals no gallop and  no friction rub.   No murmur heard. Pulmonary/Chest: Effort normal and breath sounds normal. No respiratory distress. She has no wheezes. She has no rales.  Abdominal: Soft. There is no tenderness.  Musculoskeletal: She exhibits tenderness. She exhibits no edema.  Mild tenderness over her left lateral hip. No pain with thigh rotation. No edema or deformity. Patient able to raise both of her legs without difficulty. No back or neck tenderness to palpation.  No back or neck crepitus, step-offs, or edema or deformity noted.  Lymphadenopathy:    She has no cervical adenopathy.  Neurological: She is alert and oriented to person, place, and time. No cranial nerve deficit. Coordination normal.   Cranial nerves intact.  Sensation intact in her bilateral upper and lower extremities.   Skin: Skin is warm and dry. No rash noted. She is not diaphoretic. No erythema. No pallor.  Psychiatric: She has a normal mood and affect. Her behavior is normal.  Nursing note and vitals reviewed.   ED Course  Procedures (including critical care time) Labs Review Labs Reviewed - No data to  display  Imaging Review Dg Hip Unilat With Pelvis 2-3 Views Left  05/15/2014   CLINICAL DATA:  Fall with left hip pain.  EXAM: LEFT HIP (WITH PELVIS) 2-3 VIEWS  COMPARISON:  04/02/2013  FINDINGS: Asymmetric distortion of the left greater trochanter related to remote fracture with mild heterotopic ossification. New bulbous appearance of the left inferior pubic ramus, without discrete fracture line, suggesting remote injury. There is no hip dislocation.  Osteopenia atherosclerosis.  IMPRESSION: 1. No acute osseous findings. 2. Remote left greater trochanter fracture. 3. Remote appearing left inferior pubic ramus fracture.   Electronically Signed   By: Monte Fantasia M.D.   On: 05/15/2014 11:47     EKG Interpretation None     Filed Vitals:   05/15/14 1049 05/15/14 1110 05/15/14 1211  BP: 141/62 134/61 113/50  Pulse: 60 60 59  Temp: 98 F (36.7 C)    TempSrc: Oral    Resp: 17 20 21   SpO2: 96% 94% 93%    MDM   Meds given in ED:  Medications - No data to display  New Prescriptions   No medications on file    Final diagnoses:  Left hip pain  Fall, initial encounter   This is a 81 y.o. female  With a history of a CVA, chronic kidney disease, atrial fibrillation, pacemaker, frequent falls who presents the emergency department after falling out of her wheelchair today. Patient was in the hospital for an esophageal x-ray when radiology staff report she fell while getting out of her wheelchair. Patient reports she was standing up when she fell on her left side. She is complaining of left hip pain initially. During my interview she denies any pain. She usually gets around in a wheelchair.  Patient is able to raise both of her legs without difficulty or assistance. She has no pain with movement of her thighs. There is no deformity or ecchymosis noted. Patient has very mild tenderness to her left lateral hip.   Left hip with pelvis x-ray indicates no acute osseous findings. It does indicate a  remote left greater trochanteric fracture as well as a remote appearing left inferior pubic ramus fracture. Will discharge back to her nursing facility. I advised the patient to follow-up with their primary care provider this week. I advised the patient to return to the emergency department with new or worsening symptoms or new concerns. The patient verbalized  understanding and agreement with plan.   This patient was discussed with Dr. Maryan Rued who agrees with assessment and plan.     Waynetta Pean, PA-C 05/15/14 [place], MD 05/16/14 [phone]

## 2014-05-15 NOTE — ED Notes (Addendum)
rn gave report to nurse at [facility], facility informed and aware they will have to come pick up the pts personal wheelchair wheelchair. Wheelchair is broken in several places, including foot rest.  wheelchair placed in old nurse first area, security area with a note. Will send pts seat cushion with [facility].

## 2014-05-15 NOTE — ED Notes (Signed)
PA at bedside.

## 2014-05-15 NOTE — ED Notes (Signed)
PA and MD at bedside  Pt alert and oriented x4. Respirations even and unlabored, bilateral symmetrical rise and fall of chest. Skin warm and dry. In no acute distress. Denies needs.

## 2014-05-15 NOTE — ED Notes (Signed)
Pt to xray

## 2014-05-15 NOTE — ED Notes (Signed)
PTAR arrived.  

## 2014-06-02 ENCOUNTER — Non-Acute Institutional Stay (SKILLED_NURSING_FACILITY): Payer: Medicare Other | Admitting: Internal Medicine

## 2014-06-02 DIAGNOSIS — E785 Hyperlipidemia, unspecified: Secondary | ICD-10-CM | POA: Diagnosis not present

## 2014-06-02 DIAGNOSIS — K228 Other specified diseases of esophagus: Secondary | ICD-10-CM | POA: Diagnosis not present

## 2014-06-02 DIAGNOSIS — K2289 Other specified disease of esophagus: Secondary | ICD-10-CM

## 2014-06-02 DIAGNOSIS — I10 Essential (primary) hypertension: Secondary | ICD-10-CM

## 2014-06-02 NOTE — Progress Notes (Signed)
Patient ID: Yvonne Buck, female   DOB: August 09, 1929, 81 y.o.   MRN: [id number]    Facility: [facility]  Chief complaint- Routine visit  Code status: DNR  Allergies reviewed- PCN  HPI:   81 y/o female patient is seen today for routine visit. She has been at her baseline today and is calm. She is on a geri chair. She had a fall around 2 weeks back and was in the ED with left hip pain. Xray was done and ruled out acute fracture. She was also seen in GI clinic for difficulty swallowing and thickened upper esophagus on CT of the neck- barium esophagogram was ordered. She denies any concerns today. No new concern from staff.  Review of Systems:  Constitutional: Negative for fever, chills, diaphoresis.   Respiratory: Negative for cough, sputum production, shortness of breath and wheezing.    Cardiovascular: Negative for chest pain, palpitations Gastrointestinal: Negative for heartburn, nausea, vomiting, abdominal pain.   Genitourinary: Negative for dysuria Musculoskeletal: Negative for back pain,myalgias.   Skin: Negative for itching and rash.   Neurological:  Negative for dizziness and headaches.   Psychiatric/Behavioral: has memory loss  Past medical history reviewed  Medication reviewed. See [facility]  Physical exam BP 146/82 mmHg  Pulse 68  Temp(Src) 97.3 F (36.3 C)  Resp 18  Constitutional: frail and in no acute distress.   Neck: Neck supple. No JVD present.   Nose: no sinus tenderness Mouth: poor dentition, able to open and close her mouth without problem Cardiovascular: Normal rate, regular rhythm and intact distal pulses.    Respiratory: Effort normal and breath sounds normal. No respiratory distress. She has no wheezes.   GI: Soft. Bowel sounds are normal. She exhibits no distension. There is no tenderness.  Musculoskeletal: able to move all 4 extremities, slight tilting of her neck to right side, no neck muscle spasm, decent rom at  neck   Neurological: She is alert, oriented to person and place Skin: Skin is warm and dry. She is not diaphoretic.  Psychiatric: poor insight and judgement  Labs 10/14/13 wbc 8.8, hb 14.2, hct 45.8, plt 324 10/15/13 na 140, k 3.6, bun 20, cr 0.7, glu 102, ca 10.1, tsh 0.768 10/22/13 lft wnl 12/1/15wbc 7.4, hb 13, hct 41.1, plt 266, na 141, k 3.7, bun 15, cr 0.6, glu 98, t.chol 143, tg 83, ldl 73, hdl 53, tsh 0.85 05/05/14 wbc 7.9, hb 12.1, eosinophil 26, na 142, k 3.8, bun 20, cr 0.64, ca 9.1     Assessment/plan  Esophageal wall thickening Unclear etiology. Pending barium swallow result. Reviewed gi note  HTN Stable, continue cozaar 100 mg daily, hctz 12.5 mg daily, imdur 30 mg daily, amlodipine 10 mg daily and lopressor 50 mg bid  Hyperlipidemia Continue zocor 40 mg daily

## 2014-06-16 ENCOUNTER — Emergency Department (HOSPITAL_COMMUNITY): Payer: Medicare Other

## 2014-06-16 ENCOUNTER — Inpatient Hospital Stay (HOSPITAL_COMMUNITY)
Admission: EM | Admit: 2014-06-16 | Discharge: 2014-06-19 | DRG: 157 | Disposition: A | Discharge status: Skilled Nursing Facility | Payer: Medicare Other | Attending: Family Medicine | Admitting: Family Medicine

## 2014-06-16 ENCOUNTER — Encounter (HOSPITAL_COMMUNITY): Payer: Self-pay | Admitting: Emergency Medicine

## 2014-06-16 DIAGNOSIS — M2669 Other specified disorders of temporomandibular joint: Secondary | ICD-10-CM | POA: Diagnosis present

## 2014-06-16 DIAGNOSIS — Z7951 Long term (current) use of inhaled steroids: Secondary | ICD-10-CM

## 2014-06-16 DIAGNOSIS — Z880 Allergy status to penicillin: Secondary | ICD-10-CM

## 2014-06-16 DIAGNOSIS — E039 Hypothyroidism, unspecified: Secondary | ICD-10-CM | POA: Diagnosis present

## 2014-06-16 DIAGNOSIS — E559 Vitamin D deficiency, unspecified: Secondary | ICD-10-CM | POA: Diagnosis present

## 2014-06-16 DIAGNOSIS — I10 Essential (primary) hypertension: Secondary | ICD-10-CM | POA: Diagnosis not present

## 2014-06-16 DIAGNOSIS — J9601 Acute respiratory failure with hypoxia: Secondary | ICD-10-CM | POA: Diagnosis present

## 2014-06-16 DIAGNOSIS — J45909 Unspecified asthma, uncomplicated: Secondary | ICD-10-CM | POA: Diagnosis present

## 2014-06-16 DIAGNOSIS — R6884 Jaw pain: Secondary | ICD-10-CM | POA: Diagnosis present

## 2014-06-16 DIAGNOSIS — R1311 Dysphagia, oral phase: Secondary | ICD-10-CM | POA: Diagnosis present

## 2014-06-16 DIAGNOSIS — R531 Weakness: Secondary | ICD-10-CM

## 2014-06-16 DIAGNOSIS — S0300XA Dislocation of jaw, unspecified side, initial encounter: Secondary | ICD-10-CM | POA: Diagnosis present

## 2014-06-16 DIAGNOSIS — M2662 Arthralgia of temporomandibular joint: Secondary | ICD-10-CM | POA: Diagnosis present

## 2014-06-16 DIAGNOSIS — Z79891 Long term (current) use of opiate analgesic: Secondary | ICD-10-CM | POA: Diagnosis not present

## 2014-06-16 DIAGNOSIS — Z8249 Family history of ischemic heart disease and other diseases of the circulatory system: Secondary | ICD-10-CM | POA: Diagnosis not present

## 2014-06-16 DIAGNOSIS — S030XXA Dislocation of jaw, initial encounter: Secondary | ICD-10-CM | POA: Diagnosis present

## 2014-06-16 DIAGNOSIS — Z79899 Other long term (current) drug therapy: Secondary | ICD-10-CM

## 2014-06-16 DIAGNOSIS — I495 Sick sinus syndrome: Secondary | ICD-10-CM | POA: Diagnosis present

## 2014-06-16 DIAGNOSIS — T380X5A Adverse effect of glucocorticoids and synthetic analogues, initial encounter: Secondary | ICD-10-CM | POA: Diagnosis present

## 2014-06-16 DIAGNOSIS — J181 Lobar pneumonia, unspecified organism: Secondary | ICD-10-CM | POA: Diagnosis not present

## 2014-06-16 DIAGNOSIS — Z833 Family history of diabetes mellitus: Secondary | ICD-10-CM | POA: Diagnosis not present

## 2014-06-16 DIAGNOSIS — J9600 Acute respiratory failure, unspecified whether with hypoxia or hypercapnia: Secondary | ICD-10-CM | POA: Diagnosis present

## 2014-06-16 DIAGNOSIS — I4891 Unspecified atrial fibrillation: Secondary | ICD-10-CM | POA: Diagnosis present

## 2014-06-16 DIAGNOSIS — Z950 Presence of cardiac pacemaker: Secondary | ICD-10-CM

## 2014-06-16 DIAGNOSIS — E785 Hyperlipidemia, unspecified: Secondary | ICD-10-CM | POA: Diagnosis present

## 2014-06-16 DIAGNOSIS — R0902 Hypoxemia: Secondary | ICD-10-CM

## 2014-06-16 DIAGNOSIS — N189 Chronic kidney disease, unspecified: Secondary | ICD-10-CM | POA: Diagnosis present

## 2014-06-16 DIAGNOSIS — M26629 Arthralgia of temporomandibular joint, unspecified side: Secondary | ICD-10-CM

## 2014-06-16 DIAGNOSIS — Z8673 Personal history of transient ischemic attack (TIA), and cerebral infarction without residual deficits: Secondary | ICD-10-CM

## 2014-06-16 DIAGNOSIS — F418 Other specified anxiety disorders: Secondary | ICD-10-CM | POA: Diagnosis not present

## 2014-06-16 DIAGNOSIS — F419 Anxiety disorder, unspecified: Secondary | ICD-10-CM | POA: Diagnosis present

## 2014-06-16 DIAGNOSIS — R739 Hyperglycemia, unspecified: Secondary | ICD-10-CM | POA: Diagnosis present

## 2014-06-16 DIAGNOSIS — I129 Hypertensive chronic kidney disease with stage 1 through stage 4 chronic kidney disease, or unspecified chronic kidney disease: Secondary | ICD-10-CM | POA: Diagnosis present

## 2014-06-16 DIAGNOSIS — J45901 Unspecified asthma with (acute) exacerbation: Secondary | ICD-10-CM

## 2014-06-16 DIAGNOSIS — Z7982 Long term (current) use of aspirin: Secondary | ICD-10-CM | POA: Diagnosis not present

## 2014-06-16 DIAGNOSIS — J189 Pneumonia, unspecified organism: Secondary | ICD-10-CM

## 2014-06-16 DIAGNOSIS — I480 Paroxysmal atrial fibrillation: Secondary | ICD-10-CM | POA: Diagnosis present

## 2014-06-16 DIAGNOSIS — X58XXXA Exposure to other specified factors, initial encounter: Secondary | ICD-10-CM | POA: Diagnosis present

## 2014-06-16 DIAGNOSIS — E876 Hypokalemia: Secondary | ICD-10-CM | POA: Diagnosis present

## 2014-06-16 DIAGNOSIS — F329 Major depressive disorder, single episode, unspecified: Secondary | ICD-10-CM | POA: Diagnosis present

## 2014-06-16 DIAGNOSIS — M810 Age-related osteoporosis without current pathological fracture: Secondary | ICD-10-CM | POA: Diagnosis present

## 2014-06-16 HISTORY — DX: Age-related osteoporosis without current pathological fracture: M81.0

## 2014-06-16 HISTORY — DX: Allergic rhinitis, unspecified: J30.9

## 2014-06-16 HISTORY — DX: Dislocation of jaw, unspecified side, initial encounter: S03.00XA

## 2014-06-16 HISTORY — DX: Displaced fracture of greater trochanter of left femur, initial encounter for closed fracture: S72.112A

## 2014-06-16 HISTORY — DX: Fracture of unspecified parts of lumbosacral spine and pelvis, initial encounter for closed fracture: S32.9XXA

## 2014-06-16 HISTORY — DX: Fracture of unspecified part of neck of left femur, initial encounter for closed fracture: S72.002A

## 2014-06-16 LAB — BASIC METABOLIC PANEL
Anion gap: 9 (ref 5–15)
BUN: 16 mg/dL (ref 6–23)
CO2: 28 mmol/L (ref 19–32)
Calcium: 9.3 mg/dL (ref 8.4–10.5)
Chloride: 99 mmol/L (ref 96–112)
Creatinine, Ser: 0.58 mg/dL (ref 0.50–1.10)
GFR, EST NON AFRICAN AMERICAN: 84 mL/min — AB (ref >90)
Glucose, Bld: 127 mg/dL — ABNORMAL HIGH (ref 70–99)
Potassium: 3.2 mmol/L — ABNORMAL LOW (ref 3.5–5.1)
Sodium: 136 mmol/L (ref 135–145)

## 2014-06-16 LAB — CBC WITH DIFFERENTIAL/PLATELET
Basophils Absolute: 0 10*3/uL (ref 0.0–0.1)
Basophils Relative: 0 % (ref 0–1)
EOS PCT: 8 % — AB (ref 0–5)
Eosinophils Absolute: 0.6 10*3/uL (ref 0.0–0.7)
HCT: 46.5 % — ABNORMAL HIGH (ref 36.0–46.0)
Hemoglobin: 15.2 g/dL — ABNORMAL HIGH (ref 12.0–15.0)
Lymphocytes Relative: 18 % (ref 12–46)
Lymphs Abs: 1.3 10*3/uL (ref 0.7–4.0)
MCH: 29.1 pg (ref 26.0–34.0)
MCHC: 32.7 g/dL (ref 30.0–36.0)
MCV: 89.1 fL (ref 78.0–100.0)
MONOS PCT: 10 % (ref 3–12)
Monocytes Absolute: 0.7 10*3/uL (ref 0.1–1.0)
NEUTROS PCT: 64 % (ref 43–77)
Neutro Abs: 4.5 10*3/uL (ref 1.7–7.7)
Platelets: 247 10*3/uL (ref 150–400)
RBC: 5.22 MIL/uL — ABNORMAL HIGH (ref 3.87–5.11)
RDW: 13.8 % (ref 11.5–15.5)
WBC: 7.1 10*3/uL (ref 4.0–10.5)

## 2014-06-16 LAB — MRSA PCR SCREENING: MRSA by PCR: NEGATIVE

## 2014-06-16 LAB — I-STAT TROPONIN, ED: Troponin i, poc: 0 ng/mL (ref 0.00–0.08)

## 2014-06-16 LAB — TSH: TSH: 0.488 u[IU]/mL (ref 0.350–4.500)

## 2014-06-16 LAB — BRAIN NATRIURETIC PEPTIDE: B NATRIURETIC PEPTIDE 5: 125.6 pg/mL — AB (ref 0.0–100.0)

## 2014-06-16 MED ORDER — IPRATROPIUM-ALBUTEROL 0.5-2.5 (3) MG/3ML IN SOLN
3.0000 mL | Freq: Once | RESPIRATORY_TRACT | Status: AC
  Administered 2014-06-16: 3 mL via RESPIRATORY_TRACT
  Filled 2014-06-16: qty 3

## 2014-06-16 MED ORDER — MAGNESIUM SULFATE IN D5W 10-5 MG/ML-% IV SOLN
1.0000 g | Freq: Once | INTRAVENOUS | Status: AC
  Administered 2014-06-16: 1 g via INTRAVENOUS
  Filled 2014-06-16: qty 100

## 2014-06-16 MED ORDER — FENTANYL CITRATE (PF) 100 MCG/2ML IJ SOLN
50.0000 ug | Freq: Once | INTRAMUSCULAR | Status: AC
Start: 2014-06-16 — End: 2014-06-16
  Administered 2014-06-16: 50 ug via INTRAVENOUS
  Filled 2014-06-16: qty 2

## 2014-06-16 MED ORDER — ISOSORBIDE MONONITRATE ER 30 MG PO TB24
30.0000 mg | ORAL_TABLET | Freq: Every day | ORAL | Status: DC
  Administered 2014-06-17 – 2014-06-19 (×3): 30 mg via ORAL
  Filled 2014-06-16 (×3): qty 1

## 2014-06-16 MED ORDER — LOSARTAN POTASSIUM 50 MG PO TABS
100.0000 mg | ORAL_TABLET | Freq: Every day | ORAL | Status: DC
  Administered 2014-06-17 – 2014-06-19 (×3): 100 mg via ORAL
  Filled 2014-06-16 (×3): qty 2

## 2014-06-16 MED ORDER — MAGNESIUM SULFATE 50 % IJ SOLN: 1.0000 g | Freq: Once | INTRAMUSCULAR | Status: DC

## 2014-06-16 MED ORDER — ONDANSETRON HCL 4 MG/2ML IJ SOLN: 4.0000 mg | Freq: Four times a day (QID) | INTRAMUSCULAR | Status: DC | PRN

## 2014-06-16 MED ORDER — SODIUM CHLORIDE 0.9 % IJ SOLN: 3.0000 mL | INTRAMUSCULAR | Status: DC | PRN

## 2014-06-16 MED ORDER — ALBUTEROL SULFATE (2.5 MG/3ML) 0.083% IN NEBU: 5.0000 mg | INHALATION_SOLUTION | RESPIRATORY_TRACT | Status: DC | PRN

## 2014-06-16 MED ORDER — SODIUM CHLORIDE 0.9 % IJ SOLN
3.0000 mL | Freq: Two times a day (BID) | INTRAMUSCULAR | Status: DC
  Administered 2014-06-17 – 2014-06-18 (×3): 3 mL via INTRAVENOUS

## 2014-06-16 MED ORDER — ENOXAPARIN SODIUM 40 MG/0.4ML ~~LOC~~ SOLN
40.0000 mg | SUBCUTANEOUS | Status: DC
  Administered 2014-06-16 – 2014-06-18 (×3): 40 mg via SUBCUTANEOUS
  Filled 2014-06-16 (×4): qty 0.4

## 2014-06-16 MED ORDER — METHYLPREDNISOLONE SODIUM SUCC 125 MG IJ SOLR
125.0000 mg | Freq: Once | INTRAMUSCULAR | Status: AC
  Administered 2014-06-16: 125 mg via INTRAVENOUS
  Filled 2014-06-16: qty 2

## 2014-06-16 MED ORDER — ACETAMINOPHEN 650 MG RE SUPP: 650.0000 mg | Freq: Four times a day (QID) | RECTAL | Status: DC | PRN

## 2014-06-16 MED ORDER — LORAZEPAM 2 MG/ML PO CONC: 0.5000 mg | Freq: Four times a day (QID) | ORAL | Status: DC | PRN

## 2014-06-16 MED ORDER — ACETAMINOPHEN 325 MG PO TABS
650.0000 mg | ORAL_TABLET | Freq: Four times a day (QID) | ORAL | Status: DC | PRN
  Administered 2014-06-18: 650 mg via ORAL
  Filled 2014-06-16: qty 2

## 2014-06-16 MED ORDER — METOPROLOL TARTRATE 50 MG PO TABS
50.0000 mg | ORAL_TABLET | Freq: Two times a day (BID) | ORAL | Status: DC
  Administered 2014-06-16 – 2014-06-19 (×5): 50 mg via ORAL
  Filled 2014-06-16 (×7): qty 1

## 2014-06-16 MED ORDER — ALUM & MAG HYDROXIDE-SIMETH 200-200-20 MG/5ML PO SUSP: 30.0000 mL | Freq: Four times a day (QID) | ORAL | Status: DC | PRN

## 2014-06-16 MED ORDER — SODIUM CHLORIDE 0.9 % IV BOLUS (SEPSIS)
500.0000 mL | Freq: Once | INTRAVENOUS | Status: AC
  Administered 2014-06-16: 500 mL via INTRAVENOUS

## 2014-06-16 MED ORDER — MOMETASONE FURO-FORMOTEROL FUM 100-5 MCG/ACT IN AERO
2.0000 | INHALATION_SPRAY | Freq: Two times a day (BID) | RESPIRATORY_TRACT | Status: DC
  Administered 2014-06-16 – 2014-06-18 (×4): 2 via RESPIRATORY_TRACT
  Filled 2014-06-16: qty 8.8

## 2014-06-16 MED ORDER — POLYETHYLENE GLYCOL 3350 17 G PO PACK: 17.0000 g | PACK | Freq: Every day | ORAL | Status: DC | PRN

## 2014-06-16 MED ORDER — SIMVASTATIN 40 MG PO TABS
40.0000 mg | ORAL_TABLET | Freq: Every day | ORAL | Status: DC
Start: 2014-06-16 — End: 2014-06-16
  Filled 2014-06-16: qty 1

## 2014-06-16 MED ORDER — ASPIRIN 81 MG PO CHEW
81.0000 mg | CHEWABLE_TABLET | Freq: Every day | ORAL | Status: DC
  Administered 2014-06-17 – 2014-06-19 (×3): 81 mg via ORAL
  Filled 2014-06-16 (×3): qty 1

## 2014-06-16 MED ORDER — ONDANSETRON HCL 4 MG PO TABS
4.0000 mg | ORAL_TABLET | Freq: Four times a day (QID) | ORAL | Status: DC | PRN
Start: 2014-06-16 — End: 2014-06-19

## 2014-06-16 MED ORDER — AMLODIPINE BESYLATE 10 MG PO TABS
10.0000 mg | ORAL_TABLET | Freq: Every day | ORAL | Status: DC
  Administered 2014-06-16 – 2014-06-18 (×3): 10 mg via ORAL
  Filled 2014-06-16 (×4): qty 1

## 2014-06-16 MED ORDER — METHYLPREDNISOLONE SODIUM SUCC 40 MG IJ SOLR
40.0000 mg | Freq: Two times a day (BID) | INTRAMUSCULAR | Status: DC
  Administered 2014-06-16: 40 mg via INTRAVENOUS
  Filled 2014-06-16 (×3): qty 1

## 2014-06-16 MED ORDER — POTASSIUM CHLORIDE CRYS ER 20 MEQ PO TBCR
20.0000 meq | EXTENDED_RELEASE_TABLET | Freq: Two times a day (BID) | ORAL | Status: AC
  Administered 2014-06-16 – 2014-06-17 (×2): 20 meq via ORAL
  Filled 2014-06-16 (×2): qty 1

## 2014-06-16 MED ORDER — HYDROCHLOROTHIAZIDE 25 MG PO TABS
25.0000 mg | ORAL_TABLET | Freq: Every day | ORAL | Status: DC
  Administered 2014-06-17 – 2014-06-19 (×3): 25 mg via ORAL
  Filled 2014-06-16 (×3): qty 1

## 2014-06-16 MED ORDER — MONTELUKAST SODIUM 10 MG PO TABS
10.0000 mg | ORAL_TABLET | Freq: Every day | ORAL | Status: DC
  Administered 2014-06-16 – 2014-06-18 (×3): 10 mg via ORAL
  Filled 2014-06-16 (×4): qty 1

## 2014-06-16 MED ORDER — OSELTAMIVIR PHOSPHATE 75 MG PO CAPS
75.0000 mg | ORAL_CAPSULE | Freq: Once | ORAL | Status: DC
  Filled 2014-06-16: qty 1

## 2014-06-16 MED ORDER — ATORVASTATIN CALCIUM 20 MG PO TABS
20.0000 mg | ORAL_TABLET | Freq: Every day | ORAL | Status: DC
  Administered 2014-06-17 – 2014-06-18 (×2): 20 mg via ORAL
  Filled 2014-06-16 (×3): qty 1

## 2014-06-16 MED ORDER — IPRATROPIUM-ALBUTEROL 0.5-2.5 (3) MG/3ML IN SOLN
3.0000 mL | Freq: Four times a day (QID) | RESPIRATORY_TRACT | Status: DC
  Administered 2014-06-16 – 2014-06-17 (×2): 3 mL via RESPIRATORY_TRACT
  Filled 2014-06-16 (×2): qty 3

## 2014-06-16 MED ORDER — SODIUM CHLORIDE 0.9 % IV SOLN: 250.0000 mL | INTRAVENOUS | Status: DC | PRN

## 2014-06-16 MED ORDER — LORAZEPAM 0.5 MG PO TABS
0.5000 mg | ORAL_TABLET | Freq: Four times a day (QID) | ORAL | Status: DC | PRN
  Administered 2014-06-16 – 2014-06-19 (×4): 0.5 mg via ORAL
  Filled 2014-06-16 (×4): qty 1

## 2014-06-16 MED ORDER — FENTANYL CITRATE (PF) 100 MCG/2ML IJ SOLN
50.0000 ug | Freq: Once | INTRAMUSCULAR | Status: AC
  Administered 2014-06-16: 50 ug via INTRAVENOUS
  Filled 2014-06-16: qty 2

## 2014-06-16 MED ORDER — HYDROCODONE-ACETAMINOPHEN 5-325 MG PO TABS: 1.0000 | ORAL_TABLET | Freq: Once | ORAL | Status: DC

## 2014-06-16 NOTE — ED Notes (Signed)
Patient transported to X-ray 

## 2014-06-16 NOTE — ED Notes (Signed)
Per EMS- from [facility], called out for jaw pain (chronic TMJ). Difficulty speaking and swallowing noted. C/o increased pain today. Also has cough with productive sputum-suspected aspiration. Rhonchi and wheezing to auscultation. Given 5mg  Albuterol per neb. SpO2 90-91% originally, 95% after neb treatment. BP 160/80. Lung sounds coarse after neb treatment. Hx HTN, asthma, RA.

## 2014-06-16 NOTE — ED Notes (Signed)
Attempted calling report, RN will call back promptly.  

## 2014-06-16 NOTE — ED Provider Notes (Signed)
Patient presented to the emergency room with complaints of shortness breath, cough and jaw discomfort. Physical Exam  BP 140/58 mmHg  Pulse 70  Temp(Src) 98 F (36.7 C) (Oral)  Resp 19  SpO2 92%  Physical Exam  Constitutional: No distress.  HENT:  Head: Normocephalic and atraumatic.  Right Ear: External ear normal.  Left Ear: External ear normal.  Mandible appears clinically dislocated, patient has her jaw open and mandible protruding  Eyes: Conjunctivae are normal. Right eye exhibits no discharge. Left eye exhibits no discharge. No scleral icterus.  Neck: Neck supple. No tracheal deviation present.  Cardiovascular: Normal rate.   Pulmonary/Chest: Effort normal. No stridor. No respiratory distress. She has wheezes.  Musculoskeletal: She exhibits no edema.  Neurological: She is alert. Cranial nerve deficit: no gross deficits.  Skin: Skin is warm and dry. No rash noted. She is not diaphoretic.  Psychiatric: She has a normal mood and affect.  Nursing note and vitals reviewed.   ED Course  Dental Date/Time: 06/16/2014 3:10 PM Performed by: Dorie Rank Authorized by: Dorie Rank Consent: Verbal consent obtained. Risks and benefits: risks, benefits and alternatives were discussed Consent given by: patient Local anesthesia used: no Patient sedated: no Comments: Closed reduction of a mandible dislocation was performed.  Inferior and posterior pressure was applied to her mandible.  Palpable reduction of the dislocation .  Patient was able to close her jaw after the procedure.     EKG Interpretation  Date/Time:  Monday June 16 2014 14:58:34 EDT Ventricular Rate:  72 PR Interval:  181 QRS Duration: 101 QT Interval:  442 QTC Calculation: 484 R Axis:   86 Text Interpretation:  Sinus rhythm Probable left atrial enlargement Borderline right axis deviation Minimal ST elevation, anterior leads No significant change since last tracing Confirmed by Ferry Matthis  MD-J, Broly Hatfield ([id number]) on 06/16/2014  3:06:45 PM       MDM Mandible dislocation clinically appears reduced.  Pt continues to wheeze post treatments, tachypneic and hypoxic.  Plan on repeat x-rays.  Admit for bronchospasm further treatment.   Medical screening examination/treatment/procedure(s) were conducted as a shared visit with non-physician practitioner(s) and myself.  I personally evaluated the patient during the encounter.         Dorie Rank, MD 06/16/14 [phone]

## 2014-06-16 NOTE — ED Provider Notes (Signed)
CSN: [id number]     Arrival date & time 06/16/14  1209 History   First MD Initiated Contact with Patient 06/16/14 1212     Chief Complaint  Patient presents with  . Temporomandibular Joint Pain  . Cough     (Consider location/radiation/quality/duration/timing/severity/associated sxs/prior Treatment) HPI   Patient hx HTN, paroxysmal afib, stroke, chronic TMJ pain. presents with increased bilateral jaw pain and productive cough since yesterday, associated SOB.  Subjective fever, chills. Denies CP, abdominal pain, N/V/D.  Pain is in bilateral jaw, she is able to raise and lower jaw but reports painful to do so, pt leaves jaw lowered at rest as most comfortable position.  Denies injury/trauma.  Reports jaw popped out yesterday while eating dinner.   Level V caveat for acuity of condition, pt unable to raise and lower job easily/without significant pain.   Past Medical History  Diagnosis Date  . Tachycardia-bradycardia syndrome     s/p [employer] PPM implant in [place]  . Paroxysmal atrial fibrillation     chads2vasc score is at least 6.  She is not a candidate for anticoagulation due to falls.  Her AF burden is low.  . Hypertension   . Frequent falls   . Asthma   . Hyperlipidemia   . Depression   . Anxiety   . Pacemaker   . Stroke   . Osteoporosis 06/23/2013  . Vitamin D deficiency   . Sick sinus syndrome   . Hypothyroidism   . Chronic kidney disease    Past Surgical History  Procedure Laterality Date  . Pacemaker insertion  01/04/2005    [place] PPM 1290 [phone]), GDT [phone] atrial lead and 4457 V lead all implanted in [place]  . Cholecystectomy    . Gallbladder surgery    . Tonsillectomy and adenoidectomy     Family History  Problem Relation Age of Onset  . Diabetes Mother   . High blood pressure Mother   . Heart Problems Father    History  Substance Use Topics  . Smoking status: Never Smoker   . Smokeless tobacco: Never Used  . Alcohol Use: No    OB History    No data available     Review of Systems  Unable to perform ROS: Acuity of condition      Allergies  Penicillins  Home Medications   Prior to Admission medications   Medication Sig Start Date End Date Taking? Authorizing Provider  acetaminophen (TYLENOL) 325 MG tablet Take 650 mg by mouth every 6 (six) hours as needed for mild pain or fever.    Historical Provider, MD  acetaminophen (TYLENOL) 650 MG suppository Place 650 mg rectally every 6 (six) hours as needed for fever.    Historical Provider, MD  albuterol (PROVENTIL HFA;VENTOLIN HFA) 108 (90 BASE) MCG/ACT inhaler Inhale 2 puffs into the lungs every 6 (six) hours as needed for wheezing.    Historical Provider, MD  amLODipine (NORVASC) 10 MG tablet Take 10 mg by mouth at bedtime.    Historical Provider, MD  aspirin 81 MG chewable tablet Chew 81 mg by mouth daily.    Historical Provider, MD  benztropine mesylate (COGENTIN) 1 MG/ML injection Inject 0.5 mg into the muscle once. 04/23/14   Historical Provider, MD  calcium carbonate (CALCIUM 600) 600 MG TABS tablet Take 1 tablet (600 mg total) by mouth 2 (two) times daily with a meal. 06/23/13   Gerlene Fee, NP  Fluticasone-Salmeterol (ADVAIR) 250-50 MCG/DOSE AEPB Inhale 1 puff  into the lungs 2 (two) times daily.    Historical Provider, MD  Glycerin-Hypromellose-PEG 400 (VISINE TEARS OP) Apply 1 drop to eye 2 (two) times daily as needed (DRY EYES).    Historical Provider, MD  hyaluronidase Human (HYLENEX) 150 UNIT/ML SOLN injection Inject 150 Units into the skin once.    Historical Provider, MD  hydrochlorothiazide (HYDRODIURIL) 25 MG tablet Take 1 tablet (25 mg total) by mouth daily. 07/07/13   Gerlene Fee, NP  HYDROcodone-acetaminophen (NORCO/VICODIN) 5-325 MG per tablet Take one tablet by mouth every 6 hours as needed for mild pain; Take two tablets by mouth every 6 hours as needed for moderate to severe pain 06/17/13   Tiffany L Reed, DO  isosorbide mononitrate  (IMDUR) 30 MG 24 hr tablet Take 30 mg by mouth every morning.    Historical Provider, MD  loratadine (CLARITIN) 10 MG tablet Take 10 mg by mouth daily as needed for allergies.    Historical Provider, MD  LORazepam (ATIVAN) 0.5 MG tablet Take One and 1/2 tablet by mouth three times daily for anxiety 04/17/14   Lauree Chandler, NP  losartan (COZAAR) 100 MG tablet Take 100 mg by mouth daily.    Historical Provider, MD  metoprolol (LOPRESSOR) 50 MG tablet Take 50 mg by mouth 2 (two) times daily.    Historical Provider, MD  montelukast (SINGULAIR) 10 MG tablet Take 10 mg by mouth at bedtime.    Historical Provider, MD  nitroGLYCERIN (NITROSTAT) 0.4 MG SL tablet Place 0.4 mg under the tongue every 5 (five) minutes as needed for chest pain.    Historical Provider, MD  potassium chloride 20 MEQ/15ML (10%) SOLN Take 20 mEq by mouth daily. Dilute with 4 to 8 ounces of liquid.    Historical Provider, MD  simvastatin (ZOCOR) 40 MG tablet Take 40 mg by mouth daily.    Historical Provider, MD  traMADol (ULTRAM) 50 MG tablet Take 50 mg by mouth every 6 (six) hours as needed for moderate pain.  04/04/13   Tiffany L Reed, DO   BP 159/77 mmHg  Pulse 78  Temp(Src) 98 F (36.7 C) (Oral)  Resp 28  SpO2 93% Physical Exam  Constitutional: She appears well-developed and well-nourished. No distress.  HENT:  Head: Normocephalic and atraumatic.  Mouth/Throat: Mucous membranes are dry.  Pt holding mandible in lowered position.  Tongue is dry.  She is able to raise jaw but with pain, in raised position, pt's lower jaw protrudes.   Eyes: Conjunctivae are normal.  Neck: Neck supple.  Cardiovascular: Normal rate and regular rhythm.   Pulmonary/Chest: Effort normal. No accessory muscle usage. No respiratory distress. She has no decreased breath sounds. She has wheezes. She has rhonchi.  Abdominal: Soft. She exhibits no distension. There is no tenderness. There is no rebound and no guarding.  Musculoskeletal: She exhibits  no edema.  Neurological: She is alert.  Skin: She is not diaphoretic.  Psychiatric: She has a normal mood and affect. Her behavior is normal.  Nursing note and vitals reviewed.   ED Course  Procedures (including critical care time) Labs Review Labs Reviewed  BASIC METABOLIC PANEL - Abnormal; Notable for the following:    Potassium 3.2 (*)    Glucose, Bld 127 (*)    GFR calc non Af Amer 84 (*)    All other components within normal limits  CBC WITH DIFFERENTIAL/PLATELET - Abnormal; Notable for the following:    RBC 5.22 (*)    Hemoglobin 15.2 (*)  HCT 46.5 (*)    Eosinophils Relative 8 (*)    All other components within normal limits  INFLUENZA PANEL BY PCR (TYPE A & B, H1N1)  BRAIN NATRIURETIC PEPTIDE  I-STAT TROPOININ, ED    Imaging Review Dg Chest 2 View  06/16/2014   CLINICAL DATA:  Difficulty speaking and swallowing.  EXAM: CHEST  2 VIEW  COMPARISON:  PA and lateral chest 04/29/2014 and 12/14/2012.  FINDINGS: Pacing device in place. The lungs are clear. Heart size is normal. No pneumothorax or pleural effusion. Remote lower left rib fracture is noted  IMPRESSION: No acute disease.   Electronically Signed   By: Inge Rise M.D.   On: 06/16/2014 13:38   Ct Maxillofacial Wo Cm  06/16/2014   CLINICAL DATA:  Temporomandibular joint pain.  EXAM: CT MAXILLOFACIAL WITHOUT CONTRAST  TECHNIQUE: Multidetector CT imaging of the maxillofacial structures was performed. Multiplanar CT image reconstructions were also generated. A small metallic BB was placed on the right temple in order to reliably differentiate right from left.  COMPARISON:  Neck CT 04/30/2014  FINDINGS: Chronic or recurrent anterior dislocation of the temporomandibular joints. There is no associated fracture or erosion.  Mild inflammatory mucosal thickening in the paranasal sinuses. No evidence of acute sinusitis.  Bilateral cataract resection.  Otherwise, the orbits are negative.  Limited intracranial imaging is negative  for acute disease.  IMPRESSION: Chronic or recurrent bilateral TMJ dislocation.   Electronically Signed   By: Monte Fantasia M.D.   On: 06/16/2014 13:53     EKG Interpretation   Date/Time:  Monday June 16 2014 14:58:34 EDT Ventricular Rate:  72 PR Interval:  181 QRS Duration: 101 QT Interval:  442 QTC Calculation: 484 R Axis:   86 Text Interpretation:  Sinus rhythm Probable left atrial enlargement  Borderline right axis deviation Minimal ST elevation, anterior leads No  significant change since last tracing Confirmed by KNAPP  MD-J, JON  ([id number]) on 06/16/2014 3:06:45 PM      MDM   Final diagnoses:  Temporomandibular joint (TMJ) pain  Asthma exacerbation  Hypoxia   Afebrile, nontoxic patient with two issues.  Patient's jaw dislocated last night while eating dinner.  Reduced by Dr Tomi Bamberger.  Please see his note for details.  Pt also with cough, chills/subjective fever, SOB since yesterday, hypoxic in ED, hx asthma, CXR negative, EKG nonischemic, troponin negative.  Suspect asthma exacerbation.  Coughing up white thick sputum in room.  Flu panel pending.  Neb treatments, solumedrol, magnesium given without significant improvement.  Pt admitted to hospital, Dr Rockne Menghini, [facility] hospitalist to admit.     Clayton Bibles, PA-C 06/16/14 1537

## 2014-06-16 NOTE — Progress Notes (Signed)
Pt given flutter valve and instructed on use.  Pt demonstrated use will questionable effort. Will work with Pt again at next treatment.

## 2014-06-16 NOTE — ED Notes (Signed)
Patient transported to CT 

## 2014-06-16 NOTE — ED Notes (Signed)
Bed: WA20 Expected date:  Expected time:  Means of arrival:  Comments: EMS- Jaw pain

## 2014-06-16 NOTE — H&P (Signed)
History and Physical:    Yvonne Buck   EXN:[id number] DOB: 05-Apr-1929 DOA: 06/16/2014  Referring physician: Dr. Dorie Rank PCP: Blanchie Serve, MD   Chief Complaint: Jaw pain  History of Present Illness:   Yvonne Buck is an 81 y.o. female with a PMH of tachybrady syndrome S/P PM, PAF (CHADS2vasc of 6), not a candidate for anticoagulation due to high fall risk, and asthma on Advair who presented with a chief complaint of jaw pain secondary to dislocation of the jaw in the setting of chronic TMJ.  Her jaw was reduced by EDP, and also complained of being short of breath with a cough as well.  The patient says she started getting short of breath yesterday and that she used her inhaler 4 times yesterday, and once today with little relief.  She also reports a d day history of cough productive of green mucous.  Denies any associated fever/chills, chest pain or nausea/vomiting.  She has had a 4 day history of "trouble swallowing", but no sore throat.    ROS:   Constitutional: No fever, no chills;  Appetite normal; No weight loss, no weight gain, no fatigue.  HEENT: No blurry vision, no diplopia, no pharyngitis, + dysphagia, +jaw pain CV: No chest pain, no palpitations, no PND, no orthopnea, no edema.  Resp: + SOB, + cough, no pleuritic pain. GI: No nausea, no vomiting, no diarrhea, no melena, no hematochezia, no constipation, no abdominal pain.  GU: No dysuria, no hematuria, no frequency, no urgency. MSK: no myalgias, no arthralgias.  Neuro:  No headache, no focal neurological deficits, no history of seizures.  Psych: No depression, no anxiety.  Endo: No heat intolerance, no cold intolerance, no polyuria, no polydipsia  Skin: No rashes, no skin lesions.  Heme: No easy bruising.  Travel history: No recent travel.   Past Medical History:   Past Medical History  Diagnosis Date  . Tachycardia-bradycardia syndrome     s/p [employer] PPM implant in [place]  . Paroxysmal atrial fibrillation     chads2vasc score is at least 6.  She is not a candidate for anticoagulation due to falls.  Her AF burden is low.  . Hypertension   . Frequent falls   . Asthma   . Hyperlipidemia   . Depression   . Anxiety   . Pacemaker   . Stroke   . Osteoporosis 06/23/2013  . Vitamin D deficiency   . Sick sinus syndrome   . Hypothyroidism   . Chronic kidney disease   . Fracture of greater trochanter of left femur 04/02/2013  . Hip fracture, left 04/02/2013  . Pelvic fracture 06/23/2013  . Senile osteoporosis 02/26/2014  . Rhinitis, allergic 02/26/2014  . TMJ (dislocation of temporomandibular joint) 06/16/2014    Past Surgical History:   Past Surgical History  Procedure Laterality Date  . Pacemaker insertion  01/04/2005    [place] PPM 1290 [phone]), GDT [phone] atrial lead and 4457 V lead all implanted in [place]  . Cholecystectomy    . Gallbladder surgery    . Tonsillectomy and adenoidectomy      Social History:   History   Social History  . Marital Status: Widowed    Spouse Name: N/A  . Number of Children: 2  . Years of Education: college   Occupational History  .      retired   Social History Main Topics  . Smoking status: Never Smoker   . Smokeless tobacco: Never Used  .  Alcohol Use: No  . Drug Use: No  . Sexual Activity: No   Other Topics Concern  . Not on file   Social History Narrative   Currently from [facility]  Ambulates with a walker.      Previously lived in [place].     Daughter . [name].     Patient has some college education.   Right handed   Caffeine- None    Family history:   Family History  Problem Relation Age of Onset  . Diabetes Mother   . High blood pressure Mother   . Heart Problems Father     Allergies   Penicillins  Current Medications:   Prior to Admission medications   Medication Sig Start Date End Date Taking? Authorizing Provider  amLODipine (NORVASC) 10 MG tablet Take 10 mg by mouth at bedtime.    Yes Historical Provider, MD  aspirin 81 MG chewable tablet Chew 81 mg by mouth daily.   Yes Historical Provider, MD  Fluticasone-Salmeterol (ADVAIR) 250-50 MCG/DOSE AEPB Inhale 1 puff into the lungs 2 (two) times daily.   Yes Historical Provider, MD  hydrochlorothiazide (HYDRODIURIL) 25 MG tablet Take 1 tablet (25 mg total) by mouth daily. 07/07/13  Yes Gerlene Fee, NP  isosorbide mononitrate (IMDUR) 30 MG 24 hr tablet Take 30 mg by mouth every morning.   Yes Historical Provider, MD  LORazepam (ATIVAN) 0.5 MG tablet Take One and 1/2 tablet by mouth three times daily for anxiety 04/17/14  Yes Lauree Chandler, NP  LORazepam (ATIVAN) 2 MG/ML concentrated solution Take 0.5 mg by mouth every 6 (six) hours as needed for anxiety (agitation).   Yes Historical Provider, MD  losartan (COZAAR) 100 MG tablet Take 100 mg by mouth daily.   Yes Historical Provider, MD  metoprolol (LOPRESSOR) 50 MG tablet Take 50 mg by mouth 2 (two) times daily.   Yes Historical Provider, MD  montelukast (SINGULAIR) 10 MG tablet Take 10 mg by mouth at bedtime.   Yes Historical Provider, MD  simvastatin (ZOCOR) 40 MG tablet Take 40 mg by mouth daily.   Yes Historical Provider, MD  calcium carbonate (CALCIUM 600) 600 MG TABS tablet Take 1 tablet (600 mg total) by mouth 2 (two) times daily with a meal. Patient not taking: Reported on 06/16/2014 06/23/13   Gerlene Fee, NP  HYDROcodone-acetaminophen (NORCO/VICODIN) 5-325 MG per tablet Take one tablet by mouth every 6 hours as needed for mild pain; Take two tablets by mouth every 6 hours as needed for moderate to severe pain Patient not taking: Reported on 06/16/2014 06/17/13   Gayland Curry, DO    Physical Exam:   Filed Vitals:   06/16/14 1500 06/16/14 1515 06/16/14 1530 06/16/14 1545  BP: 144/59 146/58 134/70 151/67  Pulse: 75 78 79 80  Temp:      TempSrc:      Resp: 19 31 22 17   SpO2: 88% 95% 97% 94%     Physical Exam: Blood pressure 151/67, pulse 80, temperature 98  F (36.7 C), temperature source Oral, resp. rate 17, SpO2 94 %. Gen: Moderate resp distress.  Thin. Head: Normocephalic, atraumatic. Eyes: PERRL, EOMI, sclerae nonicteric. Mouth: Oropharynx with fair dentition.  Purulent mucous noted in posterior pharynx. Neck: Supple, no thyromegaly, no lymphadenopathy, no jugular venous distention. Chest: Lungs with course expiratory wheezes, prolonged expiratory phase. CV: Heart sounds are regular, no M/R/G. Abdomen: Soft, nontender, nondistended with normal active bowel sounds. Extremities: Extremities are without C/E/C. Skin:  Warm and dry. Neuro: Alert and oriented times 3; Grossly non-focal. Psych: Mood and affect normal.   Data Review:    Labs: Basic Metabolic Panel:  Recent Labs Lab 06/16/14 1243  NA 136  K 3.2*  CL 99  CO2 28  GLUCOSE 127*  BUN 16  CREATININE 0.58  CALCIUM 9.3   CBC:  Recent Labs Lab 06/16/14 1243  WBC 7.1  NEUTROABS 4.5  HGB 15.2*  HCT 46.5*  MCV 89.1  PLT 247    Radiographic Studies: Dg Mandible 4 Views  06/16/2014   CLINICAL DATA:  Post reduction. Temporomandibular joint pain. Best obtainable images due to pt condition.  EXAM: MANDIBLE - 4+ VIEW  COMPARISON:  Current maxillofacial CT  FINDINGS: Temporomandibular joints appear reduced.  No fractures seen.  IMPRESSION: Temporomandibular joints appear to be reduced into the mandibular fossae bilaterally.   Electronically Signed   By: Lajean Manes M.D.   On: 06/16/2014 16:42   Dg Chest 2 View  06/16/2014   CLINICAL DATA:  Difficulty speaking and swallowing.  EXAM: CHEST  2 VIEW  COMPARISON:  PA and lateral chest 04/29/2014 and 12/14/2012.  FINDINGS: Pacing device in place. The lungs are clear. Heart size is normal. No pneumothorax or pleural effusion. Remote lower left rib fracture is noted  IMPRESSION: No acute disease.   Electronically Signed   By: Inge Rise M.D.   On: 06/16/2014 13:38   Ct Maxillofacial Wo Cm  06/16/2014   CLINICAL DATA:   Temporomandibular joint pain.  EXAM: CT MAXILLOFACIAL WITHOUT CONTRAST  TECHNIQUE: Multidetector CT imaging of the maxillofacial structures was performed. Multiplanar CT image reconstructions were also generated. A small metallic BB was placed on the right temple in order to reliably differentiate right from left.  COMPARISON:  Neck CT 04/30/2014  FINDINGS: Chronic or recurrent anterior dislocation of the temporomandibular joints. There is no associated fracture or erosion.  Mild inflammatory mucosal thickening in the paranasal sinuses. No evidence of acute sinusitis.  Bilateral cataract resection.  Otherwise, the orbits are negative.  Limited intracranial imaging is negative for acute disease.  IMPRESSION: Chronic or recurrent bilateral TMJ dislocation.   Electronically Signed   By: Monte Fantasia M.D.   On: 06/16/2014 13:53    EKG: Independently reviewed. NSR at 72 bpm.     Assessment/Plan:   Principal Problem:   Acute respiratory failure with hypoxia / acute asthma exacerbation - Admit for IV steroids, nebulized bronchodilators, flutter valve, supplemental oxygen. - Continue Advair (Dulera) and Singulair.  Active Problems:   Hypothyroidism - Not on Synthroid. Check TSH.    Tachycardia-bradycardia syndrome / atrial fibrillation - S/P pacemaker. - Not a candidate for anti-coagulation secondary to high fall risk. - 12 lead EKG: NSR. - Continue ASA.    Depression with anxiety - Continue Ativan.    Essential hypertension, benign - Continue Norvasc, HCTZ, Imdur, Cozaar Lopressor.    Hyperlipidemia - Continue Zocor.    TMJ (dislocation of temporomandibular joint) - Reduced in the ER by the EDP.    Hypokalemia - Supplementing.    DVT prophylaxis - Continue Lovenox.  Code Status: Full. Family Communication: No family at the bedside.  Daughter, Michel Bickers [phone]). Disposition Plan: From [facility].  Likely to return in 2-3 days when respiratory status stable,  no longer hypoxic.  Time spent: 70 minutes.  Brondon Wann [facility] Pager [phone] Cell: [phone]   If 7PM-7AM, please contact night-coverage [email or web address] Password [facility] 06/16/2014, 4:45 PM

## 2014-06-16 NOTE — Clinical Social Work Note (Signed)
Clinical Social Work Assessment  Patient Details  Name: Yvonne Buck MRN: [id number] Date of Birth: 30-Dec-1929  Date of referral:  06/16/14               Reason for consult:   (Patient from facility.)                Permission sought to share information with:   (No.) Permission granted to share information::  No  Name::        Agency::     Relationship::     Contact Information:     Housing/Transportation Living arrangements for the past 2 months:  [place] of Information:  Patient Patient Interpreter Needed:  None Criminal Activity/Legal Involvement Pertinent to Current Situation/Hospitalization:    Significant Relationships:  None (The pt informed CSW taht she does not have any family or friends here in [place].) Lives with:  Facility Resident Do you feel safe going back to the place where you live?  Yes Need for family participation in patient care:   (The pt is receving assistance with ADL's at facility. She states she does not have close family and friends.)  Care giving concerns:  The pt currently has the appropriate level of care at a facility.   Social Worker assessment / plan:  CSW met with pt at bedside. There was no family present. Patient confirms that she is from [facility]. She says that she has been living there for the past 6 months. Per note, patient presents with Temporomandibular Joint Pain and cough.   Patient informed CSW that she receives assistance with completing her ADL's. She says that she has fallen x1 within the past 6 months.  Patient states that she does not have family and friends in [place].  Patient states that she does not have any concerns or questions at the moment.  Employment status:  Unemployed Forensic scientist:  Other (Comment Required) Production designer, theatre/television/film) PT Recommendations:  [place] (The pt is currently living at a SNF; [facility].) Information / Referral to community  resources:   (The pt is currently living at a SNF.)  Patient/Family's Response to care:  There was no family present. Patient is appropriate for situation at this time.  Patient/Family's Understanding of and Emotional Response to Diagnosis, Current Treatment, and Prognosis:  Pt is aware that she is in [place].  Emotional Assessment Appearance:  Appears stated age Attitude/Demeanor/Rapport:   (Well Mannered.) Affect (typically observed):  Appropriate Orientation:    Alcohol / Substance use:    Psych involvement (Current and /or in the community):  No (Comment)  Discharge Needs  Concerns to be addressed:  Adjustment to Illness Readmission within the last 30 days:  No Current discharge risk:  None Barriers to Discharge:  No Barriers Identified   Yvonne Buffy, LCSW 06/16/2014, 10:40 PM

## 2014-06-16 NOTE — Progress Notes (Signed)
Pharmacy Brief Note  The order for simvastatin(Zocor) 40mg  was changed to an equivalent dose of atorvastatin(Lipitor) 20mg  due to the potential drug interaction with amlodipine.  When taken in combination with medications that inhibit its metabolism, simvastatin can accumulate which increases the risk of liver toxicity, myopathy, or rhabdomyolysis.  Simvastatin dose should not exceed 20mg /day in patients taking amlodipine, ranolazine or amiodarone.   Please consider this potential interaction at discharge.  Gretta Arab PharmD, BCPS Pager [phone] 06/16/2014 10:13 PM

## 2014-06-16 NOTE — ED Notes (Signed)
Patient is being transported to [facility]

## 2014-06-17 DIAGNOSIS — T380X5A Adverse effect of glucocorticoids and synthetic analogues, initial encounter: Secondary | ICD-10-CM

## 2014-06-17 DIAGNOSIS — R739 Hyperglycemia, unspecified: Secondary | ICD-10-CM | POA: Diagnosis present

## 2014-06-17 DIAGNOSIS — R531 Weakness: Secondary | ICD-10-CM

## 2014-06-17 DIAGNOSIS — S030XXD Dislocation of jaw, subsequent encounter: Secondary | ICD-10-CM

## 2014-06-17 LAB — BASIC METABOLIC PANEL
Anion gap: 9 (ref 5–15)
BUN: 20 mg/dL (ref 6–23)
CO2: 29 mmol/L (ref 19–32)
Calcium: 8.9 mg/dL (ref 8.4–10.5)
Chloride: 100 mmol/L (ref 96–112)
Creatinine, Ser: 0.61 mg/dL (ref 0.50–1.10)
GFR calc Af Amer: >90 mL/min (ref >90)
GFR, EST NON AFRICAN AMERICAN: 82 mL/min — AB (ref >90)
Glucose, Bld: 182 mg/dL — ABNORMAL HIGH (ref 70–99)
POTASSIUM: 3.6 mmol/L (ref 3.5–5.1)
SODIUM: 138 mmol/L (ref 135–145)

## 2014-06-17 LAB — URINALYSIS, ROUTINE W REFLEX MICROSCOPIC
Bilirubin Urine: NEGATIVE
Glucose, UA: 500 mg/dL — AB
HGB URINE DIPSTICK: NEGATIVE
Ketones, ur: NEGATIVE mg/dL
LEUKOCYTES UA: NEGATIVE
Nitrite: NEGATIVE
Protein, ur: NEGATIVE mg/dL
Specific Gravity, Urine: 1.015 (ref 1.005–1.030)
Urobilinogen, UA: 1 mg/dL (ref 0.0–1.0)
pH: 5.5 (ref 5.0–8.0)

## 2014-06-17 LAB — GLUCOSE, CAPILLARY
GLUCOSE-CAPILLARY: 150 mg/dL — AB (ref 70–99)
Glucose-Capillary: 190 mg/dL — ABNORMAL HIGH (ref 70–99)
Glucose-Capillary: 157 mg/dL — ABNORMAL HIGH (ref 70–99)

## 2014-06-17 LAB — INFLUENZA PANEL BY PCR (TYPE A & B)
H1N1 flu by pcr: NOT DETECTED
Influenza A By PCR: NEGATIVE
Influenza B By PCR: NEGATIVE

## 2014-06-17 MED ORDER — IPRATROPIUM-ALBUTEROL 0.5-2.5 (3) MG/3ML IN SOLN
3.0000 mL | Freq: Two times a day (BID) | RESPIRATORY_TRACT | Status: DC
  Administered 2014-06-17 – 2014-06-18 (×2): 3 mL via RESPIRATORY_TRACT
  Filled 2014-06-17 (×3): qty 3

## 2014-06-17 MED ORDER — INSULIN ASPART 100 UNIT/ML ~~LOC~~ SOLN
0.0000 [IU] | Freq: Three times a day (TID) | SUBCUTANEOUS | Status: DC
  Administered 2014-06-17 (×2): 2 [IU] via SUBCUTANEOUS
  Administered 2014-06-18: 3 [IU] via SUBCUTANEOUS
  Administered 2014-06-18: 1 [IU] via SUBCUTANEOUS

## 2014-06-17 MED ORDER — PREDNISONE 10 MG PO TABS
60.0000 mg | ORAL_TABLET | Freq: Every day | ORAL | Status: DC
  Administered 2014-06-17 – 2014-06-19 (×3): 60 mg via ORAL
  Filled 2014-06-17 (×5): qty 1

## 2014-06-17 MED ORDER — ALBUTEROL SULFATE (2.5 MG/3ML) 0.083% IN NEBU
2.5000 mg | INHALATION_SOLUTION | RESPIRATORY_TRACT | Status: DC | PRN
  Administered 2014-06-17: 2.5 mg via RESPIRATORY_TRACT
  Filled 2014-06-17: qty 3

## 2014-06-17 MED ORDER — STARCH (THICKENING) PO POWD
ORAL | Status: DC | PRN
  Filled 2014-06-17: qty 227

## 2014-06-17 MED ORDER — INSULIN ASPART 100 UNIT/ML ~~LOC~~ SOLN
0.0000 [IU] | Freq: Every day | SUBCUTANEOUS | Status: DC
  Administered 2014-06-18: 2 [IU] via SUBCUTANEOUS

## 2014-06-17 NOTE — Progress Notes (Addendum)
Progress Note   Yvonne Buck SWN:[id number] DOB: [birth year] DOA: 06/16/2014 PCP: Blanchie Serve, MD   Brief Narrative:   Yvonne Buck is an 81 y.o. female with a PMH of tachybrady syndrome S/P PM, PAF (CHADS2vasc of 6), not a candidate for anticoagulation due to high fall risk, and asthma on Advair who was admitted 06/16/14 with jaw dislocation and an acute asthma flare. Her jaws were reduced by the EDP.  Assessment/Plan:   Principal Problem:  Acute respiratory failure with hypoxia / acute asthma exacerbation - Wean steroids, continue nebulized bronchodilators, flutter valve, supplemental oxygen. - Continue Advair (Dulera) and Singulair. - Influenza panel negative.  Active Problems:   Steroid induced hyperglycemia - Add insulin sensitive SSI while on steroids. - Check hemoglobin A1c.    Weakness - PT evaluation requested. - From SNF.  Daughter reports was not walking well prior to admission.   Hypothyroidism - Not on Synthroid. TSH WNL.   Tachycardia-bradycardia syndrome / atrial fibrillation - S/P pacemaker. - Not a candidate for anti-coagulation secondary to high fall risk. - 12 lead EKG: NSR. - Continue ASA.   Depression with anxiety - Continue Ativan.   Essential hypertension, benign - Continue Norvasc, HCTZ, Imdur, Cozaar Lopressor.   Hyperlipidemia - Continue Zocor.   TMJ (dislocation of temporomandibular joint) - Reduced in the ER by the EDP.   Hypokalemia - Corrected.   DVT prophylaxis - Continue Lovenox.  Code Status: Full. Family Communication: No family at the bedside. Daughter, Yvonne Buck [phone]) updated by telephone. Disposition Plan: From [facility]. Likely to return in 1-2 days when respiratory status stable, no longer hypoxic.   IV Access:    Peripheral IV   Procedures and diagnostic studies:   Dg Mandible 4 Views  06/16/2014   CLINICAL DATA:  Post reduction. Temporomandibular joint pain. Best  obtainable images due to pt condition.  EXAM: MANDIBLE - 4+ VIEW  COMPARISON:  Current maxillofacial CT  FINDINGS: Temporomandibular joints appear reduced.  No fractures seen.  IMPRESSION: Temporomandibular joints appear to be reduced into the mandibular fossae bilaterally.   Electronically Signed   By: Lajean Manes M.D.   On: 06/16/2014 16:42   Dg Chest 2 View  06/16/2014   CLINICAL DATA:  Difficulty speaking and swallowing.  EXAM: CHEST  2 VIEW  COMPARISON:  PA and lateral chest 04/29/2014 and 12/14/2012.  FINDINGS: Pacing device in place. The lungs are clear. Heart size is normal. No pneumothorax or pleural effusion. Remote lower left rib fracture is noted  IMPRESSION: No acute disease.   Electronically Signed   By: Inge Rise M.D.   On: 06/16/2014 13:38   Ct Maxillofacial Wo Cm  06/16/2014   CLINICAL DATA:  Temporomandibular joint pain.  EXAM: CT MAXILLOFACIAL WITHOUT CONTRAST  TECHNIQUE: Multidetector CT imaging of the maxillofacial structures was performed. Multiplanar CT image reconstructions were also generated. A small metallic BB was placed on the right temple in order to reliably differentiate right from left.  COMPARISON:  Neck CT 04/30/2014  FINDINGS: Chronic or recurrent anterior dislocation of the temporomandibular joints. There is no associated fracture or erosion.  Mild inflammatory mucosal thickening in the paranasal sinuses. No evidence of acute sinusitis.  Bilateral cataract resection.  Otherwise, the orbits are negative.  Limited intracranial imaging is negative for acute disease.  IMPRESSION: Chronic or recurrent bilateral TMJ dislocation.   Electronically Signed   By: Monte Fantasia M.D.   On: 06/16/2014 13:53     Medical Consultants:  None.  Anti-Infectives:    None.  Subjective:   Yvonne Buck continues to report dyspnea, occasional cough.  Denies pain.  No N/V.  Very weak.  Objective:    Filed Vitals:   06/16/14 2127 06/16/14 2153 06/17/14 0159  06/17/14 0446  BP: 145/56   154/64  Pulse: 84   73  Temp: 97.5 F (36.4 C)   97.5 F (36.4 C)  TempSrc: Axillary   Oral  Resp: 20   20  Weight:    52.436 kg (115 lb 9.6 oz)  SpO2: 92% 94% 94% 94%    Intake/Output Summary (Last 24 hours) at 06/17/14 0814 Last data filed at 06/16/14 1839  Gross per 24 hour  Intake    240 ml  Output      0 ml  Net    240 ml    Exam: Gen:  NAD, weak Cardiovascular:  RRR, No M/R/G Respiratory:  Lungs with expiratory wheezes Gastrointestinal:  Abdomen soft, NT/ND, + BS Extremities:  No C/E/C   Data Reviewed:    Labs: Basic Metabolic Panel:  Recent Labs Lab 06/16/14 1243 06/17/14 0414  NA 136 138  K 3.2* 3.6  CL 99 100  CO2 28 29  GLUCOSE 127* 182*  BUN 16 20  CREATININE 0.58 0.61  CALCIUM 9.3 8.9   GFR Estimated Creatinine Clearance: 44.8 mL/min (by C-G formula based on Cr of 0.61).  CBC:  Recent Labs Lab 06/16/14 1243  WBC 7.1  NEUTROABS 4.5  HGB 15.2*  HCT 46.5*  MCV 89.1  PLT 247   Thyroid function studies:  Recent Labs  06/16/14 1705  TSH 0.488   Microbiology Recent Results (from the past 240 hour(s))  MRSA PCR Screening     Status: None   Collection Time: 06/16/14  6:16 PM  Result Value Ref Range Status   MRSA by PCR NEGATIVE NEGATIVE Final    Comment:        The GeneXpert MRSA Assay (FDA approved for NASAL specimens only), is one component of a comprehensive MRSA colonization surveillance program. It is not intended to diagnose MRSA infection nor to guide or monitor treatment for MRSA infections.      Medications:   . amLODipine  10 mg Oral QHS  . aspirin  81 mg Oral Daily  . atorvastatin  20 mg Oral q1800  . enoxaparin (LOVENOX) injection  40 mg Subcutaneous Q24H  . hydrochlorothiazide  25 mg Oral Daily  . ipratropium-albuterol  3 mL Nebulization BID  . isosorbide mononitrate  30 mg Oral Daily  . losartan  100 mg Oral Daily  . metoprolol  50 mg Oral BID  . mometasone-formoterol  2  puff Inhalation BID  . montelukast  10 mg Oral QHS  . potassium chloride  20 mEq Oral BID  . predniSONE  60 mg Oral Q breakfast  . sodium chloride  3 mL Intravenous Q12H   Continuous Infusions:   Time spent: 35 minutes with > 50% of time discussing current diagnostic test results, clinical impression and plan of care with the patient's daughter.    LOS: 1 day   Ayyub Krall  [facility] Pager [phone]. If unable to reach me by pager, please call my cell phone at [phone].  *Please refer to [email or web address], password [email or web address] to get updated schedule on who will round on this patient, as hospitalists switch teams weekly. If 7PM-7AM, please contact night-coverage at [email or web address], password [email or web address] for any overnight needs.  06/17/2014, 8:14 AM

## 2014-06-17 NOTE — Progress Notes (Signed)
CSW continuing to follow.  Pt admitted from [facility].   CSW spoke with Isaias Cowman who reports that pt is a long term resident at the facility.   CSW met with pt and pt daughter, [name] at bedside. Pt oriented to person only at this time.   Pt daughter confirmed that plan will be to return to [facility] when pt medically stable for discharge.  CSW sent clinicals to [facility].  CSW to continue to follow to provide support and assist with pt disposition needs.   Alison Murray, MSW, [place] [facility] [phone]

## 2014-06-17 NOTE — Evaluation (Signed)
Clinical/Bedside Swallow Evaluation Patient Details  Name: Yvonne Buck MRN: [id number] Date of Birth: [birth year]  Today's Date: 06/17/2014 Time: SLP Start Time (ACUTE ONLY): 78 SLP Stop Time (ACUTE ONLY): 1600 SLP Time Calculation (min) (ACUTE ONLY): 45 min  Past Medical History:  Past Medical History  Diagnosis Date  . Tachycardia-bradycardia syndrome     s/p [employer] PPM implant in [place]  . Paroxysmal atrial fibrillation     chads2vasc score is at least 6.  She is not a candidate for anticoagulation due to falls.  Her AF burden is low.  . Hypertension   . Frequent falls   . Asthma   . Hyperlipidemia   . Depression   . Anxiety   . Pacemaker   . Stroke   . Osteoporosis 06/23/2013  . Vitamin D deficiency   . Sick sinus syndrome   . Hypothyroidism   . Chronic kidney disease   . Fracture of greater trochanter of left femur 04/02/2013  . Hip fracture, left 04/02/2013  . Pelvic fracture 06/23/2013  . Senile osteoporosis 02/26/2014  . Rhinitis, allergic 02/26/2014  . TMJ (dislocation of temporomandibular joint) 06/16/2014   Past Surgical History:  Past Surgical History  Procedure Laterality Date  . Pacemaker insertion  01/04/2005    [place] PPM 1290 [phone]), GDT [phone] atrial lead and 4457 V lead all implanted in [place]  . Cholecystectomy    . Gallbladder surgery    . Tonsillectomy and adenoidectomy     HPI:  Yvonne Buck is an 81 y.o. female with a PMH of tachybrady syndrome S/P PM, PAF (CHADS2vasc of 6), not a candidate for anticoagulation due to high fall risk, and asthma on Advair who presented with a chief complaint of jaw pain secondary to dislocation of the jaw in the setting of chronic TMJ. Her jaw was reduced by EDP, and also complained of being short of breath with a cough as well. The patient says she started getting short of breath yesterday and that she used her inhaler 4 times yesterday, and once today with little relief. She also reports a d  day history of cough productive of green mucous. Denies any associated fever/chills, chest pain or nausea/vomiting. She has had a 4 day history of "trouble swallowing", but no sore throat. Xray of mandible: Temporomandibular joints appear to be reduced into the mandibular fossae bilaterally.   Assessment / Plan / Recommendation Clinical Impression  Pt presents with chronic TMJ pain causing severe headaches and inability to chew solids.  Pt also c/o sore throat and has congested cough.  Expiratory wheeze noted.  CXR states no active disease.  Pt exhibits s/s of aspiration (coughing) after sips of water and nectar thick juice.  Will change diet to Dysphagia 2 (pt said "no Puree") and honey thick liquid for now, and complete MBS in am.    Aspiration Risk  Moderate    Diet Recommendation Dysphagia 3 (Mechanical Soft);Honey-thick liquid   Liquid Administration via: Cup;No straw Medication Administration: Whole meds with puree Compensations: Slow rate;Small sips/bites Postural Changes and/or Swallow Maneuvers: Seated upright 90 degrees    Other  Recommendations Recommended Consults: MBS Oral Care Recommendations: Oral care BID Other Recommendations: Order thickener from pharmacy;Prohibited food (jello, ice cream, thin soups);Clarify dietary restrictions   Follow Up Recommendations  Skilled Nursing facility    Frequency and Duration        Pertinent Vitals/Pain Afebrile, LS exp wheeze, currently denies pain         Swallow  Study Prior Functional Status       General HPI: Yvonne Buck is an 81 y.o. female with a PMH of tachybrady syndrome S/P PM, PAF (CHADS2vasc of 6), not a candidate for anticoagulation due to high fall risk, and asthma on Advair who presented with a chief complaint of jaw pain secondary to dislocation of the jaw in the setting of chronic TMJ. Her jaw was reduced by EDP, and also complained of being short of breath with a cough as well. The patient says she  started getting short of breath yesterday and that she used her inhaler 4 times yesterday, and once today with little relief. She also reports a d day history of cough productive of green mucous. Denies any associated fever/chills, chest pain or nausea/vomiting. She has had a 4 day history of "trouble swallowing", but no sore throat. Xray of mandible: Temporomandibular joints appear to be reduced into the mandibular fossae bilaterally. Type of Study: Bedside swallow evaluation Previous Swallow Assessment: none Diet Prior to this Study: Regular;Thin liquids Temperature Spikes Noted: No Respiratory Status: Nasal cannula History of Recent Intubation: No Behavior/Cognition: Alert;Cooperative;Requires cueing Oral Cavity - Dentition: Adequate natural dentition Self-Feeding Abilities: Able to feed self Patient Positioning: Upright in bed Baseline Vocal Quality: Clear Volitional Cough: Congested Volitional Swallow: Able to elicit    Oral/Motor/Sensory Function Overall Oral Motor/Sensory Function: Appears within functional limits for tasks assessed   Ice Chips Ice chips: Within functional limits Presentation: Spoon   Thin Liquid Thin Liquid: Impaired Presentation: Spoon;Cup;Straw Pharyngeal  Phase Impairments: Suspected delayed Swallow;Decreased hyoid-laryngeal movement;Multiple swallows;Cough - Immediate    Nectar Thick Nectar Thick Liquid: Impaired Presentation: Cup Pharyngeal Phase Impairments: Decreased hyoid-laryngeal movement;Cough - Immediate   Honey Thick Honey Thick Liquid: Within functional limits Presentation: Cup   Puree Puree: Within functional limits Presentation: Spoon   Solid   GO    Solid: Impaired Presentation:  (Pt refused "too hard." (cracker)) Oral Phase Impairments: Impaired mastication (Chronic TMJ)       Quinn Axe T 06/17/2014,4:10 PM

## 2014-06-18 ENCOUNTER — Inpatient Hospital Stay (HOSPITAL_COMMUNITY): Payer: Medicare Other

## 2014-06-18 LAB — GLUCOSE, CAPILLARY
GLUCOSE-CAPILLARY: 101 mg/dL — AB (ref 70–99)
GLUCOSE-CAPILLARY: 132 mg/dL — AB (ref 70–99)
GLUCOSE-CAPILLARY: 175 mg/dL — AB (ref 70–99)

## 2014-06-18 LAB — HEMOGLOBIN A1C
HEMOGLOBIN A1C: 6.2 % — AB (ref 4.8–5.6)
MEAN PLASMA GLUCOSE: 131 mg/dL

## 2014-06-18 MED ORDER — ALBUTEROL SULFATE (2.5 MG/3ML) 0.083% IN NEBU
2.5000 mg | INHALATION_SOLUTION | RESPIRATORY_TRACT | Status: DC
  Administered 2014-06-18 – 2014-06-19 (×3): 2.5 mg via RESPIRATORY_TRACT
  Filled 2014-06-18 (×3): qty 3

## 2014-06-18 MED ORDER — ALBUTEROL SULFATE (2.5 MG/3ML) 0.083% IN NEBU
2.5000 mg | INHALATION_SOLUTION | RESPIRATORY_TRACT | Status: DC | PRN
  Filled 2014-06-18: qty 3

## 2014-06-18 MED ORDER — ALBUTEROL SULFATE (2.5 MG/3ML) 0.083% IN NEBU
2.5000 mg | INHALATION_SOLUTION | Freq: Four times a day (QID) | RESPIRATORY_TRACT | Status: DC
Start: 2014-06-18 — End: 2014-06-18
  Administered 2014-06-18: 2.5 mg via RESPIRATORY_TRACT
  Filled 2014-06-18: qty 3

## 2014-06-18 NOTE — Evaluation (Signed)
Physical Therapy Evaluation Patient Details Name: Yvonne Buck MRN: [id number] DOB: 04/03/1929 Today's Date: 06/18/2014   History of Present Illness  81 yo female admitted with acute respiratory failure, jaw pain. Hx of tachybrady syndrome, PAF, asthma, chronic TMJ, CVA, pacemaker, HTn, osteoporosis, L femur fx 2015. Pt is from SNF-long term  Clinical Impression  On eval, pt required Mod assist for bed mobility. Attempted to stand pt but unable. Recommend return to SNF.     Follow Up Recommendations SNF;Supervision/Assistance - 24 hour    Equipment Recommendations  None recommended by PT    Recommendations for Other Services       Precautions / Restrictions Precautions Precautions: Fall Restrictions Weight Bearing Restrictions: No      Mobility  Bed Mobility Overal bed mobility: Needs Assistance Bed Mobility: Supine to Sit;Sit to Supine     Supine to sit: Min assist Sit to supine: Mod assist   General bed mobility comments: Assist for trunk and bil LEs. Increased time. Multimodal cues.   Transfers Overall transfer level: Needs assistance               General transfer comment: Attemtped to stand however pt has significant difficulty with anterior weightshifting. Leans heavily in posteior direction as soon as an attempt is made to stand up.   Ambulation/Gait             General Gait Details: NT  Stairs            Wheelchair Mobility    Modified Rankin (Stroke Patients Only)       Balance Overall balance assessment: Needs assistance;History of Falls   Sitting balance-Leahy Scale: Fair                                       Pertinent Vitals/Pain Pain Assessment: No/denies pain    Home Living Family/patient expects to be discharged to:: Skilled nursing facility                      Prior Function Level of Independence: Needs assistance               Hand Dominance        Extremity/Trunk Assessment    Upper Extremity Assessment: Generalized weakness           Lower Extremity Assessment: Generalized weakness      Cervical / Trunk Assessment: Kyphotic  Communication   Communication: Prefers language other than English  Cognition Arousal/Alertness: Awake/alert Behavior During Therapy: WFL for tasks assessed/performed Overall Cognitive Status: Within Functional Limits for tasks assessed                      General Comments      Exercises        Assessment/Plan    PT Assessment Patient needs continued PT services  PT Diagnosis Difficulty walking;Generalized weakness   PT Problem List Decreased strength;Decreased activity tolerance;Decreased balance;Decreased mobility;Decreased knowledge of use of DME  PT Treatment Interventions Functional mobility training;Therapeutic activities;Therapeutic exercise;Patient/family education;Balance training;DME instruction   PT Goals (Current goals can be found in the Care Plan section) Acute Rehab PT Goals Patient Stated Goal: none stated PT Goal Formulation: With patient Time For Goal Achievement: 07/02/14 Potential to Achieve Goals: Fair    Frequency Min 2X/week   Barriers to discharge        Co-evaluation  End of Session   Activity Tolerance: Patient tolerated treatment well Patient left: in bed;with call bell/phone within reach;with bed alarm set           Time: 0017-4944 PT Time Calculation (min) (ACUTE ONLY): 22 min   Charges:   PT Evaluation $Initial PT Evaluation Tier I: 1 Procedure     PT G Codes:        Weston Anna, MPT Pager: [phone]

## 2014-06-18 NOTE — Progress Notes (Signed)
Neb treatment cut short due to Pt's extreme agitation.  Pt refused to where mask, ripping it off throwing the neb cup while holding on to the mask.  RN aware.

## 2014-06-18 NOTE — Care Management Note (Signed)
CARE MANAGEMENT NOTE 06/18/2014  Patient:  Yvonne Buck, Yvonne Buck   Account Number:  [id number]  Date Initiated:  06/18/2014  Documentation initiated by:  Marney Doctor  Subjective/Objective Assessment:   81 yo admitted with Acute respiratory failure     Action/Plan:   Pt from [facility] and plan is to return there at DC   Anticipated DC Date:  06/19/2014   Anticipated DC Plan:  [facility] referral  Clinical Social Worker      DC Planning Services  CM consult      Choice offered to / List presented to:             Status of service:  In process, will continue to follow Medicare Important Message given?   (If response is "NO", the following Medicare IM given date fields will be blank) Date Medicare IM given:   Medicare IM given by:   Date Additional Medicare IM given:   Additional Medicare IM given by:    Discharge Disposition:    Per UR Regulation:  Reviewed for med. necessity/level of care/duration of stay  If discussed at [place] of Stay Meetings, dates discussed:    Comments:  06/18/14 Marney Doctor RN,BSN,NCM [phone] Chart reviewed and CM following for DC needs.   Lynnell Catalan, RN 06/18/2014, 3:26 PM

## 2014-06-18 NOTE — Progress Notes (Signed)
Progress Note   Yvonne Buck IOX:[id number] DOB: Jul 10, 1929 DOA: 06/16/2014 PCP: Blanchie Serve, MD   Brief Narrative:   Yvonne Buck is an 81 y.o. female with a PMH of tachybrady syndrome S/P PM, PAF (CHADS2vasc of 6), not a candidate for anticoagulation due to high fall risk, and asthma on Advair who was admitted 06/16/14 with jaw dislocation and an acute asthma flare. Her jaws were reduced by the EDP. She was seen by SLP who recommended Dys 3 diet  Assessment/Plan:   Principal Problem:  Acute respiratory failure with hypoxia / acute asthma exacerbation - Wean steroids--complete 5 day burst 06/21/14, continue nebulized bronchodilators, flutter valve, supplemental oxygen. - desat screen - Continue Advair (Dulera) and Singulair. - Influenza panel negative.  Active Problems:   Steroid induced hyperglycemia - Add insulin sensitive SSI while on steroids. - sugars 120-->130's - Check hemoglobin A1c.    Weakness - PT evaluation requested. - From SNF.   - Daughter reports was not walking well prior to admission.   Hypothyroidism - Not on Synthroid. TSH WNL.   Tachycardia-bradycardia syndrome / atrial fibrillation - S/P pacemaker. - Not a candidate for anti-coagulation secondary to high fall risk. - 12 lead EKG: NSR. - Continue ASA.   Depression with anxiety - Continue Ativan.   Essential hypertension, benign - Continue Norvasc, HCTZ, Imdur, Cozaar Lopressor.   Hyperlipidemia - Continue Zocor.   TMJ (dislocation of temporomandibular joint) - Reduced in the ER by the EDP.   Hypokalemia - Corrected.   DVT prophylaxis - Continue Lovenox.  Code Status: Full. Family Communication: discussed in person c Daughter, Michel Bickers [phone]) updated by telephone. Disposition Plan: From [facility]. Likely to return in 24 hrs when respiratory status stable, no longer hypoxic.   IV Access:    Peripheral IV   Procedures and diagnostic  studies:   Dg Mandible 4 Views  06/16/2014   CLINICAL DATA:  Post reduction. Temporomandibular joint pain. Best obtainable images due to pt condition.  EXAM: MANDIBLE - 4+ VIEW  COMPARISON:  Current maxillofacial CT  FINDINGS: Temporomandibular joints appear reduced.  No fractures seen.  IMPRESSION: Temporomandibular joints appear to be reduced into the mandibular fossae bilaterally.   Electronically Signed   By: Lajean Manes M.D.   On: 06/16/2014 16:42   Dg Swallowing Func-speech Pathology  06/18/2014   Joaquim Nam, CCC-SLP     06/18/2014 12:28 PM  Objective Swallowing Evaluation: Modified Barium Swallowing Study   Patient Details  Name: Yvonne Buck MRN: [id number] Date of Birth: [birth year]  Today's Date: 06/18/2014 Time: SLP Start Time (ACUTE ONLY): 1120-SLP Stop Time (ACUTE  ONLY): 1217 SLP Time Calculation (min) (ACUTE ONLY): 57 min  Past Medical History:  Past Medical History  Diagnosis Date  . Tachycardia-bradycardia syndrome     s/p [employer] PPM implant in [place]  . Paroxysmal atrial fibrillation     chads2vasc score is at least 6.  She is not a candidate for  anticoagulation due to falls.  Her AF burden is low.  . Hypertension   . Frequent falls   . Asthma   . Hyperlipidemia   . Depression   . Anxiety   . Pacemaker   . Stroke   . Osteoporosis 06/23/2013  . Vitamin D deficiency   . Sick sinus syndrome   . Hypothyroidism   . Chronic kidney disease   . Fracture of greater trochanter of left femur 04/02/2013  . Hip fracture, left 04/02/2013  . Pelvic fracture 06/23/2013  .  Senile osteoporosis 02/26/2014  . Rhinitis, allergic 02/26/2014  . TMJ (dislocation of temporomandibular joint) 06/16/2014   Past Surgical History:  Past Surgical History  Procedure Laterality Date  . Pacemaker insertion  01/04/2005    [place] PPM 1290 [phone]), GDT [phone]  atrial lead and 4457 V lead all implanted in [place]  . Cholecystectomy    . Gallbladder surgery    . Tonsillectomy and adenoidectomy     HPI:  HPI: Yvonne Buck is an 81 y.o. female with a PMH of tachybrady  syndrome S/P PM, PAF (CHADS2vasc of 6), not a candidate for  anticoagulation due to high fall risk, and asthma on Advair who  presented with a chief complaint of jaw pain secondary to  dislocation of the jaw in the setting of chronic TMJ. Her jaw  was reduced by EDP, and also complained of being short of breath  with a cough as well. The patient says she started getting short  of breath yesterday and that she used her inhaler 4 times  yesterday, and once today with little relief. She also reports a  d day history of cough productive of green mucous. Denies any  associated fever/chills, chest pain or nausea/vomiting. She has  had a 4 day history of "trouble swallowing", but no sore throat.  Xray of mandible: Temporomandibular joints appear to be reduced  into the mandibular fossae bilaterally.  No Data Recorded  Assessment / Plan / Recommendation CHL IP CLINICAL IMPRESSIONS 06/18/2014  Dysphagia Diagnosis Mild oral phase dysphagia;Mild pharyngeal  phase dysphagia;Mild cervical esophageal phase dysphagia  Clinical impression Pt exhibits a mild oropharyngeal and  questionable mild esophageal dysphagia, characterized by  difficulty chewing solid foods (due to chronic TMJ dysfunction),  delayed swallow initiation, reduced base of tongue contraction to  the pharyngeal wall, and premature spillage of liquids to the  valleculae and pyriforms.  There was piecmeal swallows noted with  solids, and residue on the posterior tongue after the swallow (pt  cleared this with a cue to "swallow again)."  The pharynx  appeared to be cleared and the study was ended, but pt began  coughing.  Flouro was turned back on and there appeared to be a  small amount of barium penetrated on the posterior portion of the  airway.  Question reflux?  The esophagus appeared clear prior to  pt coughing, but after, there was a small amount of barium in the  esophagus.        CHL IP TREATMENT  RECOMMENDATION 06/18/2014  Treatment Plan Recommendations Therapy as outlined in treatment  plan below    CHL IP DIET RECOMMENDATION 06/18/2014  Diet Recommendations Dysphagia 3 (Mechanical Soft);Thin liquid  Liquid Administration via Cup;No straw  Medication Administration Whole meds with puree  Compensations Slow rate;Small sips/bites  Postural Changes and/or Swallow Maneuvers Seated upright 90  degrees;Upright 30-60 min after meal     CHL IP OTHER RECOMMENDATIONS 06/18/2014  Recommended Consults Consider esophageal assessment  Oral Care Recommendations Oral care BID  Other Recommendations Clarify dietary restrictions     CHL IP FOLLOW UP RECOMMENDATIONS 06/18/2014  Follow up Recommendations Skilled Nursing facility     [facility] IP FREQUENCY AND DURATION 06/18/2014  Speech Therapy Frequency (ACUTE ONLY) min 1 x/week  Treatment Duration 1 week     Pertinent Vitals/Pain Pt complains of throat pain (5).  RN aware.    SLP Swallow Goals No flowsheet data found.  No flowsheet data found.    CHL IP REASON  FOR REFERRAL 06/18/2014  Reason for Referral Objectively evaluate swallowing function     CHL IP ORAL PHASE 06/18/2014  Lips (None)  Tongue (None)  Mucous membranes (None)  Nutritional status (None)  Other (None)  Oxygen therapy (None)  Oral Phase Impaired  Oral - Pudding Teaspoon (None)  Oral - Pudding Cup (None)  Oral - Honey Teaspoon (None)  Oral - Honey Cup (None)  Oral - Honey Syringe (None)  Oral - Nectar Teaspoon (None)  Oral - Nectar Cup (None)  Oral - Nectar Straw (None)  Oral - Nectar Syringe (None)  Oral - Ice Chips (None)  Oral - Thin Teaspoon (None)  Oral - Thin Cup (None)  Oral - Thin Straw (None)  Oral - Thin Syringe (None)  Oral - Puree (None)  Oral - Mechanical Soft (None)  Oral - Regular Impaired mastication  Oral - Multi-consistency (None)  Oral - Pill (None)  Oral Phase - Comment (None)      CHL IP PHARYNGEAL PHASE 06/18/2014  Pharyngeal Phase Impaired  Pharyngeal - Pudding Teaspoon (None)  Penetration/Aspiration  details (pudding teaspoon) (None)  Pharyngeal - Pudding Cup (None)  Penetration/Aspiration details (pudding cup) (None)  Pharyngeal - Honey Teaspoon (None)  Penetration/Aspiration details (honey teaspoon) (None)  Pharyngeal - Honey Cup (None)  Penetration/Aspiration details (honey cup) (None)  Pharyngeal - Honey Syringe (None)  Penetration/Aspiration details (honey syringe) (None)  Pharyngeal - Nectar Teaspoon (None)  Penetration/Aspiration details (nectar teaspoon) (None)  Pharyngeal - Nectar Cup Delayed swallow initiation;Premature  spillage to valleculae  Penetration/Aspiration details (nectar cup) (None)  Pharyngeal - Nectar Straw (None)  Penetration/Aspiration details (nectar straw) (None)  Pharyngeal - Nectar Syringe (None)  Penetration/Aspiration details (nectar syringe) (None)  Pharyngeal - Ice Chips (None)  Penetration/Aspiration details (ice chips) (None)  Pharyngeal - Thin Teaspoon (None)  Penetration/Aspiration details (thin teaspoon) (None)  Pharyngeal - Thin Cup Delayed swallow initiation;Premature  spillage to valleculae;Premature spillage to pyriform sinuses  Penetration/Aspiration details (thin cup) (None)  Pharyngeal - Thin Straw Delayed swallow initiation;Premature  spillage to valleculae;Premature spillage to pyriform sinuses  Penetration/Aspiration details (thin straw) (None)  Pharyngeal - Thin Syringe (None)  Penetration/Aspiration details (thin syringe') (None)  Pharyngeal - Puree (None)  Penetration/Aspiration details (puree) (None)  Pharyngeal - Mechanical Soft (None)  Penetration/Aspiration details (mechanical soft) (None)  Pharyngeal - Regular (None)  Penetration/Aspiration details (regular) (None)  Pharyngeal - Multi-consistency (None)  Penetration/Aspiration details (multi-consistency) (None)  Pharyngeal - Pill (None)  Penetration/Aspiration details (pill) (None)  Pharyngeal Comment (None)     CHL IP CERVICAL ESOPHAGEAL PHASE 06/18/2014  Cervical Esophageal Phase Impaired  Pudding  Teaspoon (None)  Pudding Cup (None)  Honey Teaspoon (None)  Honey Cup (None)  Honey Syringe (None)  Nectar Teaspoon (None)  Nectar Cup (None)  Nectar Straw (None)  Nectar Syringe (None)  Thin Teaspoon (None)  Thin Cup (None)  Thin Straw (None)  Thin Syringe (None)  Cervical Esophageal Comment See impressions    No flowsheet data found.         Quinn Axe T 06/18/2014, 12:18 PM      Medical Consultants:    None.  Anti-Infectives:    None.  Subjective:   doing fair No n/v Cough+++, green sputum Throat is also sore No cp NO n Mild HA  Objective:    Filed Vitals:   06/17/14 2120 06/17/14 2300 06/18/14 1101 06/18/14 1324  BP:    149/79  Pulse:    75  Temp:    97.7 F (36.5 C)  TempSrc:  Oral  Resp:    20  Height:  5\' 5"  (1.651 m)    Weight:      SpO2: 91%  95% 94%    Intake/Output Summary (Last 24 hours) at 06/18/14 1345 Last data filed at 06/18/14 1328  Gross per 24 hour  Intake    640 ml  Output    450 ml  Net    190 ml    Exam: Gen:  NAD, weak, frail Cardiovascular:  RRR, No M/R/G Respiratory:  Lungs with expiratory wheezes bilaterally posteriorly Gastrointestinal:  Abdomen soft, NT/ND, + BS Extremities:  No C/E/C   Data Reviewed:    Labs: Basic Metabolic Panel:  Recent Labs Lab 06/16/14 1243 06/17/14 0414  NA 136 138  K 3.2* 3.6  CL 99 100  CO2 28 29  GLUCOSE 127* 182*  BUN 16 20  CREATININE 0.58 0.61  CALCIUM 9.3 8.9   GFR Estimated Creatinine Clearance: 44.8 mL/min (by C-G formula based on Cr of 0.61).  CBC:  Recent Labs Lab 06/16/14 1243  WBC 7.1  NEUTROABS 4.5  HGB 15.2*  HCT 46.5*  MCV 89.1  PLT 247   Thyroid function studies:  Recent Labs  06/16/14 1705  TSH 0.488   Microbiology Recent Results (from the past 240 hour(s))  MRSA PCR Screening     Status: None   Collection Time: 06/16/14  6:16 PM  Result Value Ref Range Status   MRSA by PCR NEGATIVE NEGATIVE Final    Comment:        The GeneXpert MRSA Assay  (FDA approved for NASAL specimens only), is one component of a comprehensive MRSA colonization surveillance program. It is not intended to diagnose MRSA infection nor to guide or monitor treatment for MRSA infections.      Medications:   . amLODipine  10 mg Oral QHS  . aspirin  81 mg Oral Daily  . atorvastatin  20 mg Oral q1800  . enoxaparin (LOVENOX) injection  40 mg Subcutaneous Q24H  . hydrochlorothiazide  25 mg Oral Daily  . insulin aspart  0-5 Units Subcutaneous QHS  . insulin aspart  0-9 Units Subcutaneous TID WC  . ipratropium-albuterol  3 mL Nebulization BID  . isosorbide mononitrate  30 mg Oral Daily  . losartan  100 mg Oral Daily  . metoprolol  50 mg Oral BID  . mometasone-formoterol  2 puff Inhalation BID  . montelukast  10 mg Oral QHS  . predniSONE  60 mg Oral Q breakfast  . sodium chloride  3 mL Intravenous Q12H   Continuous Infusions:   Time spent: 35 minutes with > 50% of time discussing current diagnostic test results, clinical impression and plan of care with the patient's daughter.   Verneita Griffes, MD [facility] [phone]

## 2014-06-18 NOTE — Procedures (Signed)
Objective Swallowing Evaluation: Modified Barium Swallowing Study  Patient Details  Name: Yvonne Buck MRN: [id number] Date of Birth: 29-Mar-1929  Today's Date: 06/18/2014 Time: SLP Start Time (ACUTE ONLY): 1120-SLP Stop Time (ACUTE ONLY): 1217 SLP Time Calculation (min) (ACUTE ONLY): 57 min  Past Medical History:  Past Medical History  Diagnosis Date  . Tachycardia-bradycardia syndrome     s/p [employer] PPM implant in [place]  . Paroxysmal atrial fibrillation     chads2vasc score is at least 6.  She is not a candidate for anticoagulation due to falls.  Her AF burden is low.  . Hypertension   . Frequent falls   . Asthma   . Hyperlipidemia   . Depression   . Anxiety   . Pacemaker   . Stroke   . Osteoporosis 06/23/2013  . Vitamin D deficiency   . Sick sinus syndrome   . Hypothyroidism   . Chronic kidney disease   . Fracture of greater trochanter of left femur 04/02/2013  . Hip fracture, left 04/02/2013  . Pelvic fracture 06/23/2013  . Senile osteoporosis 02/26/2014  . Rhinitis, allergic 02/26/2014  . TMJ (dislocation of temporomandibular joint) 06/16/2014   Past Surgical History:  Past Surgical History  Procedure Laterality Date  . Pacemaker insertion  01/04/2005    [place] PPM [id number]), GDT [phone] atrial lead and 4457 V lead all implanted in [place]  . Cholecystectomy    . Gallbladder surgery    . Tonsillectomy and adenoidectomy     HPI:  HPI: Yvonne Buck is an 81 y.o. female with a PMH of tachybrady syndrome S/P PM, PAF (CHADS2vasc of 6), not a candidate for anticoagulation due to high fall risk, and asthma on Advair who presented with a chief complaint of jaw pain secondary to dislocation of the jaw in the setting of chronic TMJ. Her jaw was reduced by EDP, and also complained of being short of breath with a cough as well. The patient says she started getting short of breath yesterday and that she used her inhaler 4 times yesterday, and once today with  little relief. She also reports a d day history of cough productive of green mucous. Denies any associated fever/chills, chest pain or nausea/vomiting. She has had a 4 day history of "trouble swallowing", but no sore throat. Xray of mandible: Temporomandibular joints appear to be reduced into the mandibular fossae bilaterally.  No Data Recorded  Assessment / Plan / Recommendation CHL IP CLINICAL IMPRESSIONS 06/18/2014  Dysphagia Diagnosis Mild oral phase dysphagia;Mild pharyngeal phase dysphagia;Mild cervical esophageal phase dysphagia  Clinical impression Pt exhibits a mild oropharyngeal and questionable mild esophageal dysphagia, characterized by difficulty chewing solid foods (due to chronic TMJ dysfunction), delayed swallow initiation, reduced base of tongue contraction to the pharyngeal wall, and premature spillage of liquids to the valleculae and pyriforms.  There was piecmeal swallows noted with solids, and residue on the posterior tongue after the swallow (pt cleared this with a cue to "swallow again)."  The pharynx appeared to be cleared and the study was ended, but pt began coughing.  Flouro was turned back on and there appeared to be a small amount of barium penetrated on the posterior portion of the airway.  Question reflux?  The esophagus appeared clear prior to pt coughing, but after, there was a small amount of barium in the esophagus.        CHL IP TREATMENT RECOMMENDATION 06/18/2014  Treatment Plan Recommendations Therapy as outlined in treatment plan below  CHL IP DIET RECOMMENDATION 06/18/2014  Diet Recommendations Dysphagia 3 (Mechanical Soft);Thin liquid  Liquid Administration via Cup;No straw  Medication Administration Whole meds with puree  Compensations Slow rate;Small sips/bites  Postural Changes and/or Swallow Maneuvers Seated upright 90 degrees;Upright 30-60 min after meal     CHL IP OTHER RECOMMENDATIONS 06/18/2014  Recommended Consults Consider esophageal  assessment  Oral Care Recommendations Oral care BID  Other Recommendations Clarify dietary restrictions     CHL IP FOLLOW UP RECOMMENDATIONS 06/18/2014  Follow up Recommendations Skilled Nursing facility     [facility] IP FREQUENCY AND DURATION 06/18/2014  Speech Therapy Frequency (ACUTE ONLY) min 1 x/week  Treatment Duration 1 week     Pertinent Vitals/Pain Pt complains of throat pain (5).  RN aware.    SLP Swallow Goals No flowsheet data found.  No flowsheet data found.    CHL IP REASON FOR REFERRAL 06/18/2014  Reason for Referral Objectively evaluate swallowing function     CHL IP ORAL PHASE 06/18/2014  Lips (None)  Tongue (None)  Mucous membranes (None)  Nutritional status (None)  Other (None)  Oxygen therapy (None)  Oral Phase Impaired  Oral - Pudding Teaspoon (None)  Oral - Pudding Cup (None)  Oral - Honey Teaspoon (None)  Oral - Honey Cup (None)  Oral - Honey Syringe (None)  Oral - Nectar Teaspoon (None)  Oral - Nectar Cup (None)  Oral - Nectar Straw (None)  Oral - Nectar Syringe (None)  Oral - Ice Chips (None)  Oral - Thin Teaspoon (None)  Oral - Thin Cup (None)  Oral - Thin Straw (None)  Oral - Thin Syringe (None)  Oral - Puree (None)  Oral - Mechanical Soft (None)  Oral - Regular Impaired mastication  Oral - Multi-consistency (None)  Oral - Pill (None)  Oral Phase - Comment (None)      CHL IP PHARYNGEAL PHASE 06/18/2014  Pharyngeal Phase Impaired  Pharyngeal - Pudding Teaspoon (None)  Penetration/Aspiration details (pudding teaspoon) (None)  Pharyngeal - Pudding Cup (None)  Penetration/Aspiration details (pudding cup) (None)  Pharyngeal - Honey Teaspoon (None)  Penetration/Aspiration details (honey teaspoon) (None)  Pharyngeal - Honey Cup (None)  Penetration/Aspiration details (honey cup) (None)  Pharyngeal - Honey Syringe (None)  Penetration/Aspiration details (honey syringe) (None)  Pharyngeal - Nectar Teaspoon (None)  Penetration/Aspiration details  (nectar teaspoon) (None)  Pharyngeal - Nectar Cup Delayed swallow initiation;Premature spillage to valleculae  Penetration/Aspiration details (nectar cup) (None)  Pharyngeal - Nectar Straw (None)  Penetration/Aspiration details (nectar straw) (None)  Pharyngeal - Nectar Syringe (None)  Penetration/Aspiration details (nectar syringe) (None)  Pharyngeal - Ice Chips (None)  Penetration/Aspiration details (ice chips) (None)  Pharyngeal - Thin Teaspoon (None)  Penetration/Aspiration details (thin teaspoon) (None)  Pharyngeal - Thin Cup Delayed swallow initiation;Premature spillage to valleculae;Premature spillage to pyriform sinuses  Penetration/Aspiration details (thin cup) (None)  Pharyngeal - Thin Straw Delayed swallow initiation;Premature spillage to valleculae;Premature spillage to pyriform sinuses  Penetration/Aspiration details (thin straw) (None)  Pharyngeal - Thin Syringe (None)  Penetration/Aspiration details (thin syringe') (None)  Pharyngeal - Puree (None)  Penetration/Aspiration details (puree) (None)  Pharyngeal - Mechanical Soft (None)  Penetration/Aspiration details (mechanical soft) (None)  Pharyngeal - Regular (None)  Penetration/Aspiration details (regular) (None)  Pharyngeal - Multi-consistency (None)  Penetration/Aspiration details (multi-consistency) (None)  Pharyngeal - Pill (None)  Penetration/Aspiration details (pill) (None)  Pharyngeal Comment (None)     CHL IP CERVICAL ESOPHAGEAL PHASE 06/18/2014  Cervical Esophageal Phase Impaired  Pudding Teaspoon (None)  Pudding Cup (None)  Honey  Teaspoon (None)  Honey Cup (None)  Honey Syringe (None)  Nectar Teaspoon (None)  Nectar Cup (None)  Nectar Straw (None)  Nectar Syringe (None)  Thin Teaspoon (None)  Thin Cup (None)  Thin Straw (None)  Thin Syringe (None)  Cervical Esophageal Comment See impressions    No flowsheet data found.         Quinn Axe T 06/18/2014, 12:18 PM

## 2014-06-18 NOTE — Progress Notes (Signed)
CSW continuing to follow.  Pt admitted from [facility].   PT evaluated pt today and per [place] if pt daughter interested in pt having rehab in facility then PT evaluations needed to submit to insurance company.   CSW met with pt and pt daughter at bedside. CSW inquired with pt daughter surrounding her feelings about additional therapy at [facility]. Pt daughter states that she wishes for [place] to attempt to get authorization for rehab in the facility.   CSW notified Isaias Cowman and sent [employer] pt PT evaluation.  [facility] can accept pt back when medically stable regardless if approved for the rehab part as pt was a long term resident prior to admission.   CSW awaiting determination from MD about when pt will be medically ready for d/c.  CSW to continue to follow.  Alison Murray, MSW, [place] Work [phone]

## 2014-06-19 ENCOUNTER — Inpatient Hospital Stay (HOSPITAL_COMMUNITY): Payer: Medicare Other

## 2014-06-19 LAB — CBC WITH DIFFERENTIAL/PLATELET
BASOS ABS: 0 10*3/uL (ref 0.0–0.1)
Basophils Relative: 0 % (ref 0–1)
EOS PCT: 0 % (ref 0–5)
Eosinophils Absolute: 0 10*3/uL (ref 0.0–0.7)
HCT: 45.5 % (ref 36.0–46.0)
Hemoglobin: 15 g/dL (ref 12.0–15.0)
LYMPHS ABS: 1.9 10*3/uL (ref 0.7–4.0)
Lymphocytes Relative: 15 % (ref 12–46)
MCH: 28.8 pg (ref 26.0–34.0)
MCHC: 33 g/dL (ref 30.0–36.0)
MCV: 87.3 fL (ref 78.0–100.0)
Monocytes Absolute: 1.1 10*3/uL — ABNORMAL HIGH (ref 0.1–1.0)
Monocytes Relative: 9 % (ref 3–12)
Neutro Abs: 9.7 10*3/uL — ABNORMAL HIGH (ref 1.7–7.7)
Neutrophils Relative %: 76 % (ref 43–77)
PLATELETS: 335 10*3/uL (ref 150–400)
RBC: 5.21 MIL/uL — ABNORMAL HIGH (ref 3.87–5.11)
RDW: 13.8 % (ref 11.5–15.5)
WBC: 12.7 10*3/uL — ABNORMAL HIGH (ref 4.0–10.5)

## 2014-06-19 LAB — COMPREHENSIVE METABOLIC PANEL
ALK PHOS: 68 U/L (ref 39–117)
ALT: 21 U/L (ref 0–35)
AST: 19 U/L (ref 0–37)
Albumin: 3.6 g/dL (ref 3.5–5.2)
Anion gap: 11 (ref 5–15)
BILIRUBIN TOTAL: 0.5 mg/dL (ref 0.3–1.2)
BUN: 25 mg/dL — ABNORMAL HIGH (ref 6–23)
CHLORIDE: 99 mmol/L (ref 96–112)
CO2: 29 mmol/L (ref 19–32)
Calcium: 9.6 mg/dL (ref 8.4–10.5)
Creatinine, Ser: 0.6 mg/dL (ref 0.50–1.10)
GFR calc non Af Amer: 83 mL/min — ABNORMAL LOW (ref >90)
Glucose, Bld: 119 mg/dL — ABNORMAL HIGH (ref 70–99)
Potassium: 3.2 mmol/L — ABNORMAL LOW (ref 3.5–5.1)
SODIUM: 139 mmol/L (ref 135–145)
Total Protein: 7.1 g/dL (ref 6.0–8.3)

## 2014-06-19 LAB — GLUCOSE, CAPILLARY
GLUCOSE-CAPILLARY: 108 mg/dL — AB (ref 70–99)
GLUCOSE-CAPILLARY: 97 mg/dL (ref 70–99)
Glucose-Capillary: 218 mg/dL — ABNORMAL HIGH (ref 70–99)

## 2014-06-19 MED ORDER — ALBUTEROL SULFATE HFA 108 (90 BASE) MCG/ACT IN AERS: 2.0000 | INHALATION_SPRAY | Freq: Four times a day (QID) | RESPIRATORY_TRACT | Status: DC | PRN

## 2014-06-19 MED ORDER — PREDNISONE 20 MG PO TABS: 60.0000 mg | ORAL_TABLET | Freq: Every day | ORAL | Status: DC

## 2014-06-19 MED ORDER — ALPRAZOLAM 0.25 MG PO TABS
0.2500 mg | ORAL_TABLET | Freq: Once | ORAL | Status: AC
  Administered 2014-06-19: 0.25 mg via ORAL
  Filled 2014-06-19: qty 1

## 2014-06-19 MED ORDER — ALBUTEROL SULFATE (2.5 MG/3ML) 0.083% IN NEBU: 2.5000 mg | INHALATION_SOLUTION | Freq: Two times a day (BID) | RESPIRATORY_TRACT | Status: DC

## 2014-06-19 NOTE — Progress Notes (Signed)
Pt for discharge to [facility].  CSW facilitated pt discharge needs including contacting facility, faxing pt discharge information via [facility], discussing with pt daughter at bedside, providing RN phone number to call report, and arranging ambulance transport via [place] for pt to [facility].   Pt daughter coping appropriately with transition back to [facility].   No further social work needs identified at this time.  CSW signing off at this time.  Alison Murray, MSW, [place] Work [phone]

## 2014-06-19 NOTE — Progress Notes (Signed)
Speech Language Pathology Treatment: Dysphagia  Patient Details Name: Yvonne Buck MRN: [id number] DOB: 1929/06/18 Today's Date: 06/19/2014 Time: 1884-1660 SLP Time Calculation (min) (ACUTE ONLY): 13 min  Assessment / Plan / Recommendation Clinical Impression  F/u after yesterday's MBS - pt eating minimally, but tolerating dysphagia 3, thin liquids with no overt s/s of difficulty nor aspiration.  Requires min cues due to cognitive deficits, to maximize safety and mitigate impulsivity with eating.  Pt for D/C back to [facility] today - will D/C SLP services.    HPI HPI: Yvonne Buck is an 81 y.o. female with a PMH of tachybrady syndrome S/P PM, PAF (CHADS2vasc of 6), not a candidate for anticoagulation due to high fall risk, and asthma on Advair who presented with a chief complaint of jaw pain secondary to dislocation of the jaw in the setting of chronic TMJ. Her jaw was reduced by EDP, and also complained of being short of breath with a cough as well. The patient says she started getting short of breath yesterday and that she used her inhaler 4 times yesterday, and once today with little relief. She also reports a d day history of cough productive of green mucous. Denies any associated fever/chills, chest pain or nausea/vomiting. She has had a 4 day history of "trouble swallowing", but no sore throat. Xray of mandible: Temporomandibular joints appear to be reduced into the mandibular fossae bilaterally.   Pertinent Vitals Pain Assessment: No/denies pain  SLP Plan  All goals met;Discharge SLP treatment due to (comment) (D/C to [facility])    Recommendations Diet recommendations: Dysphagia 3 (mechanical soft);Thin liquid Liquids provided via: Cup Medication Administration: Whole meds with puree Supervision: Patient able to self feed;Full supervision/cueing for compensatory strategies Compensations: Slow rate;Small sips/bites Postural Changes and/or Swallow Maneuvers: Seated upright  90 degrees;Upright 30-60 min after meal              Oral Care Recommendations: Oral care BID Follow up Recommendations: Skilled Nursing facility Plan: All goals met;Discharge SLP treatment due to (comment) (D/C to [facility])    GO     Yvonne Buck 06/19/2014, 12:04 PM

## 2014-06-19 NOTE — Progress Notes (Signed)
Patient discharged back to [facility] place by [facility] ambulance, report called to RN at 1400.

## 2014-06-19 NOTE — Discharge Summary (Signed)
Physician Discharge Summary  Yvonne Buck VEL:[id number] DOB: [birth year] DOA: 06/16/2014  PCP: Blanchie Serve, MD  Admit date: 06/16/2014 Discharge date: 06/19/2014  Time spent: 25 minutes  Recommendations for Outpatient Follow-up:  1. Will need close follow-up with Primary care physician 2. Consider Demtnia work-up as OP 3. New meds  -recommend prednisone burst 60 mg for 3 more days ending 4/30  -Recommend albuterol inhaler every 6 when necessary with spacer 4.  Consideration should be given as an outpatient to follow-up with an ENT surgeon-she is at reasonable risk for aspiration given her TMJ issues 5. Discharging back to skilled nursing facility today  Discharge Diagnoses:  Principal Problem:   Acute respiratory failure with hypoxia Active Problems:   Hypothyroidism   Tachycardia-bradycardia syndrome   Atrial fibrillation   Depression with anxiety   Chronic asthma   Essential hypertension, benign   Hyperlipidemia   TMJ (dislocation of temporomandibular joint)   Hypokalemia   Weakness   Steroid-induced hyperglycemia   Discharge Condition: Fair  Diet recommendation: Dysphagia 3 until speech therapy deems otherwise  Filed Weights   06/16/14 1729 06/17/14 0446  Weight: 51.71 kg (114 lb) 52.436 kg (115 lb 9.6 oz)    History of present illness:  Yvonne Buck is an 81 y.o. female with a PMH of tachybrady syndrome S/P PM, PAF (CHADS2vasc of 6), not a candidate for anticoagulation due to high fall risk, and asthma on Advair who was admitted 06/16/14 with jaw dislocation and an acute asthma flare. Her jaws were reduced by the EDP. She was seen by SLP who recommended Dys 3 diet  Hospital Course:     Acute respiratory failure with hypoxia / acute asthma exacerbation - Wean steroids--complete 5 day burst 06/21/14, continue nebulized bronchodilators, flutter valve, supplemental oxygen. - desat screen - Continue Advair (Dulera) and Singulair. - Influenza panel  negative.    Steroid induced hyperglycemia - Add insulin sensitive SSI while on steroids. - sugars 120-->130's - Check hemoglobin A1c.    Weakness - PT evaluation requested. - From SNF.    - Daughter reports was not walking well prior to admission.    Hypothyroidism - Not on Synthroid. TSH WNL.    Tachycardia-bradycardia syndrome / atrial fibrillation - S/P pacemaker. - Not a candidate for anti-coagulation secondary to high fall risk. - 12 lead EKG: NSR. - Continue ASA81 for some antiplt protection    Depression with anxiety - Continue Ativan.    Essential hypertension, benign - Continue Norvasc, HCTZ, Imdur, Cozaar Lopressor.    Hyperlipidemia - Continue Zocor.    TMJ (dislocation of temporomandibular joint) - Reduced in the ER by the EDP.    Hypokalemia - Corrected.    Discharge Exam: Filed Vitals:   06/19/14 0632  BP: 131/56  Pulse: 62  Temp: 97.2 F (36.2 C)  Resp: 20    General: eomi ncat Cardiovascular: s1 s2 no m/r/g Respiratory: wheezy but improved  Discharge Instructions    Current Discharge Medication List    START taking these medications   Details  albuterol (PROVENTIL HFA;VENTOLIN HFA) 108 (90 BASE) MCG/ACT inhaler Inhale 2 puffs into the lungs every 6 (six) hours as needed for wheezing or shortness of breath. Qty: 1 Inhaler, Refills: 2    predniSONE (DELTASONE) 20 MG tablet Take 3 tablets (60 mg total) by mouth daily with breakfast. Qty: 9 tablet, Refills: 0      CONTINUE these medications which have NOT CHANGED   Details  amLODipine (NORVASC) 10 MG tablet Take 10 mg by  mouth at bedtime.    aspirin 81 MG chewable tablet Chew 81 mg by mouth daily.    Fluticasone-Salmeterol (ADVAIR) 250-50 MCG/DOSE AEPB Inhale 1 puff into the lungs 2 (two) times daily.    hydrochlorothiazide (HYDRODIURIL) 25 MG tablet Take 1 tablet (25 mg total) by mouth daily. Qty: 90 tablet, Refills: 3   Associated Diagnoses: Essential hypertension, benign     isosorbide mononitrate (IMDUR) 30 MG 24 hr tablet Take 30 mg by mouth every morning.    LORazepam (ATIVAN) 0.5 MG tablet Take One and 1/2 tablet by mouth three times daily for anxiety Qty: 135 tablet, Refills: 5    losartan (COZAAR) 100 MG tablet Take 100 mg by mouth daily.    metoprolol (LOPRESSOR) 50 MG tablet Take 50 mg by mouth 2 (two) times daily.    montelukast (SINGULAIR) 10 MG tablet Take 10 mg by mouth at bedtime.    simvastatin (ZOCOR) 40 MG tablet Take 40 mg by mouth daily.    calcium carbonate (CALCIUM 600) 600 MG TABS tablet Take 1 tablet (600 mg total) by mouth 2 (two) times daily with a meal. Qty: 60 tablet, Refills: 11   Associated Diagnoses: Pelvic fracture; Osteoporosis    HYDROcodone-acetaminophen (NORCO/VICODIN) 5-325 MG per tablet Take one tablet by mouth every 6 hours as needed for mild pain; Take two tablets by mouth every 6 hours as needed for moderate to severe pain Qty: 240 tablet, Refills: 0      STOP taking these medications     LORazepam (ATIVAN) 2 MG/ML concentrated solution        Allergies  Allergen Reactions  . Penicillins Other (See Comments)    Red spots ### Tolerated Rocephin 12/2012 ###      The results of significant diagnostics from this hospitalization (including imaging, microbiology, ancillary and laboratory) are listed below for reference.    Significant Diagnostic Studies: Dg Mandible 4 Views  06/16/2014   CLINICAL DATA:  Post reduction. Temporomandibular joint pain. Best obtainable images due to pt condition.  EXAM: MANDIBLE - 4+ VIEW  COMPARISON:  Current maxillofacial CT  FINDINGS: Temporomandibular joints appear reduced.  No fractures seen.  IMPRESSION: Temporomandibular joints appear to be reduced into the mandibular fossae bilaterally.   Electronically Signed   By: Lajean Manes M.D.   On: 06/16/2014 16:42   Dg Chest 2 View  06/19/2014   CLINICAL DATA:  Weakness.  Evaluate for pneumonia.  EXAM: CHEST  2 VIEW  COMPARISON:   06/16/2014  FINDINGS: The lungs are underinflated. Additionally, the images were acquired with the patient sitting and in the AP orientation. There is a left chest wall pacer device with lead in the right atrial appendage and right ventricle. Normal heart size. No pleural effusion or edema. No airspace consolidation identified.  IMPRESSION: 1. Diminished exam detail due to low lung volumes. 2. No pneumonia identified. There is a continued concern for infection then a repeat chest radiograph in the standing PA projection should be obtained.   Electronically Signed   By: Kerby Moors M.D.   On: 06/19/2014 09:34   Dg Chest 2 View  06/16/2014   CLINICAL DATA:  Difficulty speaking and swallowing.  EXAM: CHEST  2 VIEW  COMPARISON:  PA and lateral chest 04/29/2014 and 12/14/2012.  FINDINGS: Pacing device in place. The lungs are clear. Heart size is normal. No pneumothorax or pleural effusion. Remote lower left rib fracture is noted  IMPRESSION: No acute disease.   Electronically Signed   By: Marcello Moores  Dalessio M.D.   On: 06/16/2014 13:38   Dg Swallowing Func-speech Pathology  06/18/2014   Joaquim Nam, CCC-SLP     06/18/2014 12:28 PM  Objective Swallowing Evaluation: Modified Barium Swallowing Study   Patient Details  Name: Michol Emory MRN: [id number] Date of Birth: [birth year]  Today's Date: 06/18/2014 Time: SLP Start Time (ACUTE ONLY): 1120-SLP Stop Time (ACUTE  ONLY): 1217 SLP Time Calculation (min) (ACUTE ONLY): 57 min  Past Medical History:  Past Medical History  Diagnosis Date  . Tachycardia-bradycardia syndrome     s/p [employer] PPM implant in [place]  . Paroxysmal atrial fibrillation     chads2vasc score is at least 6.  She is not a candidate for  anticoagulation due to falls.  Her AF burden is low.  . Hypertension   . Frequent falls   . Asthma   . Hyperlipidemia   . Depression   . Anxiety   . Pacemaker   . Stroke   . Osteoporosis 06/23/2013  . Vitamin D deficiency   . Sick sinus syndrome   . Hypothyroidism    . Chronic kidney disease   . Fracture of greater trochanter of left femur 04/02/2013  . Hip fracture, left 04/02/2013  . Pelvic fracture 06/23/2013  . Senile osteoporosis 02/26/2014  . Rhinitis, allergic 02/26/2014  . TMJ (dislocation of temporomandibular joint) 06/16/2014   Past Surgical History:  Past Surgical History  Procedure Laterality Date  . Pacemaker insertion  01/04/2005    [place] PPM 1290 [phone]), GDT [phone]  atrial lead and 4457 V lead all implanted in [place]  . Cholecystectomy    . Gallbladder surgery    . Tonsillectomy and adenoidectomy     HPI:  HPI: Shainna Faux is an 81 y.o. female with a PMH of tachybrady  syndrome S/P PM, PAF (CHADS2vasc of 6), not a candidate for  anticoagulation due to high fall risk, and asthma on Advair who  presented with a chief complaint of jaw pain secondary to  dislocation of the jaw in the setting of chronic TMJ. Her jaw  was reduced by EDP, and also complained of being short of breath  with a cough as well. The patient says she started getting short  of breath yesterday and that she used her inhaler 4 times  yesterday, and once today with little relief. She also reports a  d day history of cough productive of green mucous. Denies any  associated fever/chills, chest pain or nausea/vomiting. She has  had a 4 day history of "trouble swallowing", but no sore throat.  Xray of mandible: Temporomandibular joints appear to be reduced  into the mandibular fossae bilaterally.  No Data Recorded  Assessment / Plan / Recommendation CHL IP CLINICAL IMPRESSIONS 06/18/2014  Dysphagia Diagnosis Mild oral phase dysphagia;Mild pharyngeal  phase dysphagia;Mild cervical esophageal phase dysphagia  Clinical impression Pt exhibits a mild oropharyngeal and  questionable mild esophageal dysphagia, characterized by  difficulty chewing solid foods (due to chronic TMJ dysfunction),  delayed swallow initiation, reduced base of tongue contraction to  the pharyngeal wall, and  premature spillage of liquids to the  valleculae and pyriforms.  There was piecmeal swallows noted with  solids, and residue on the posterior tongue after the swallow (pt  cleared this with a cue to "swallow again)."  The pharynx  appeared to be cleared and the study was ended, but pt began  coughing.  Flouro was turned back on and there appeared to be a  small  amount of barium penetrated on the posterior portion of the  airway.  Question reflux?  The esophagus appeared clear prior to  pt coughing, but after, there was a small amount of barium in the  esophagus.        CHL IP TREATMENT RECOMMENDATION 06/18/2014  Treatment Plan Recommendations Therapy as outlined in treatment  plan below    CHL IP DIET RECOMMENDATION 06/18/2014  Diet Recommendations Dysphagia 3 (Mechanical Soft);Thin liquid  Liquid Administration via Cup;No straw  Medication Administration Whole meds with puree  Compensations Slow rate;Small sips/bites  Postural Changes and/or Swallow Maneuvers Seated upright 90  degrees;Upright 30-60 min after meal     CHL IP OTHER RECOMMENDATIONS 06/18/2014  Recommended Consults Consider esophageal assessment  Oral Care Recommendations Oral care BID  Other Recommendations Clarify dietary restrictions     CHL IP FOLLOW UP RECOMMENDATIONS 06/18/2014  Follow up Recommendations Skilled Nursing facility     [facility] IP FREQUENCY AND DURATION 06/18/2014  Speech Therapy Frequency (ACUTE ONLY) min 1 x/week  Treatment Duration 1 week     Pertinent Vitals/Pain Pt complains of throat pain (5).  RN aware.    SLP Swallow Goals No flowsheet data found.  No flowsheet data found.    CHL IP REASON FOR REFERRAL 06/18/2014  Reason for Referral Objectively evaluate swallowing function     CHL IP ORAL PHASE 06/18/2014  Lips (None)  Tongue (None)  Mucous membranes (None)  Nutritional status (None)  Other (None)  Oxygen therapy (None)  Oral Phase Impaired  Oral - Pudding Teaspoon (None)  Oral - Pudding Cup (None)  Oral - Honey Teaspoon (None)  Oral  - Honey Cup (None)  Oral - Honey Syringe (None)  Oral - Nectar Teaspoon (None)  Oral - Nectar Cup (None)  Oral - Nectar Straw (None)  Oral - Nectar Syringe (None)  Oral - Ice Chips (None)  Oral - Thin Teaspoon (None)  Oral - Thin Cup (None)  Oral - Thin Straw (None)  Oral - Thin Syringe (None)  Oral - Puree (None)  Oral - Mechanical Soft (None)  Oral - Regular Impaired mastication  Oral - Multi-consistency (None)  Oral - Pill (None)  Oral Phase - Comment (None)      CHL IP PHARYNGEAL PHASE 06/18/2014  Pharyngeal Phase Impaired  Pharyngeal - Pudding Teaspoon (None)  Penetration/Aspiration details (pudding teaspoon) (None)  Pharyngeal - Pudding Cup (None)  Penetration/Aspiration details (pudding cup) (None)  Pharyngeal - Honey Teaspoon (None)  Penetration/Aspiration details (honey teaspoon) (None)  Pharyngeal - Honey Cup (None)  Penetration/Aspiration details (honey cup) (None)  Pharyngeal - Honey Syringe (None)  Penetration/Aspiration details (honey syringe) (None)  Pharyngeal - Nectar Teaspoon (None)  Penetration/Aspiration details (nectar teaspoon) (None)  Pharyngeal - Nectar Cup Delayed swallow initiation;Premature  spillage to valleculae  Penetration/Aspiration details (nectar cup) (None)  Pharyngeal - Nectar Straw (None)  Penetration/Aspiration details (nectar straw) (None)  Pharyngeal - Nectar Syringe (None)  Penetration/Aspiration details (nectar syringe) (None)  Pharyngeal - Ice Chips (None)  Penetration/Aspiration details (ice chips) (None)  Pharyngeal - Thin Teaspoon (None)  Penetration/Aspiration details (thin teaspoon) (None)  Pharyngeal - Thin Cup Delayed swallow initiation;Premature  spillage to valleculae;Premature spillage to pyriform sinuses  Penetration/Aspiration details (thin cup) (None)  Pharyngeal - Thin Straw Delayed swallow initiation;Premature  spillage to valleculae;Premature spillage to pyriform sinuses  Penetration/Aspiration details (thin straw) (None)  Pharyngeal - Thin Syringe (None)   Penetration/Aspiration details (thin syringe') (None)  Pharyngeal - Puree (None)  Penetration/Aspiration details (puree) (None)  Pharyngeal -  Mechanical Soft (None)  Penetration/Aspiration details (mechanical soft) (None)  Pharyngeal - Regular (None)  Penetration/Aspiration details (regular) (None)  Pharyngeal - Multi-consistency (None)  Penetration/Aspiration details (multi-consistency) (None)  Pharyngeal - Pill (None)  Penetration/Aspiration details (pill) (None)  Pharyngeal Comment (None)     CHL IP CERVICAL ESOPHAGEAL PHASE 06/18/2014  Cervical Esophageal Phase Impaired  Pudding Teaspoon (None)  Pudding Cup (None)  Honey Teaspoon (None)  Honey Cup (None)  Honey Syringe (None)  Nectar Teaspoon (None)  Nectar Cup (None)  Nectar Straw (None)  Nectar Syringe (None)  Thin Teaspoon (None)  Thin Cup (None)  Thin Straw (None)  Thin Syringe (None)  Cervical Esophageal Comment See impressions    No flowsheet data found.         Quinn Axe T 06/18/2014, 12:18 PM    Ct Maxillofacial Wo Cm  06/16/2014   CLINICAL DATA:  Temporomandibular joint pain.  EXAM: CT MAXILLOFACIAL WITHOUT CONTRAST  TECHNIQUE: Multidetector CT imaging of the maxillofacial structures was performed. Multiplanar CT image reconstructions were also generated. A small metallic BB was placed on the right temple in order to reliably differentiate right from left.  COMPARISON:  Neck CT 04/30/2014  FINDINGS: Chronic or recurrent anterior dislocation of the temporomandibular joints. There is no associated fracture or erosion.  Mild inflammatory mucosal thickening in the paranasal sinuses. No evidence of acute sinusitis.  Bilateral cataract resection.  Otherwise, the orbits are negative.  Limited intracranial imaging is negative for acute disease.  IMPRESSION: Chronic or recurrent bilateral TMJ dislocation.   Electronically Signed   By: Monte Fantasia M.D.   On: 06/16/2014 13:53    Microbiology: Recent Results (from the past 240 hour(s))  MRSA PCR  Screening     Status: None   Collection Time: 06/16/14  6:16 PM  Result Value Ref Range Status   MRSA by PCR NEGATIVE NEGATIVE Final    Comment:        The GeneXpert MRSA Assay (FDA approved for NASAL specimens only), is one component of a comprehensive MRSA colonization surveillance program. It is not intended to diagnose MRSA infection nor to guide or monitor treatment for MRSA infections.      Labs: Basic Metabolic Panel:  Recent Labs Lab 06/16/14 1243 06/17/14 0414 06/19/14 0418  NA 136 138 139  K 3.2* 3.6 3.2*  CL 99 100 99  CO2 28 29 29   GLUCOSE 127* 182* 119*  BUN 16 20 25*  CREATININE 0.58 0.61 0.60  CALCIUM 9.3 8.9 9.6   Liver Function Tests:  Recent Labs Lab 06/19/14 0418  AST 19  ALT 21  ALKPHOS 68  BILITOT 0.5  PROT 7.1  ALBUMIN 3.6   No results for input(s): LIPASE, AMYLASE in the last 168 hours. No results for input(s): AMMONIA in the last 168 hours. CBC:  Recent Labs Lab 06/16/14 1243 06/19/14 0418  WBC 7.1 12.7*  NEUTROABS 4.5 9.7*  HGB 15.2* 15.0  HCT 46.5* 45.5  MCV 89.1 87.3  PLT 247 335   Cardiac Enzymes: No results for input(s): CKTOTAL, CKMB, CKMBINDEX, TROPONINI in the last 168 hours. BNP: BNP (last 3 results)  Recent Labs  06/16/14 1448  BNP 125.6*    ProBNP (last 3 results) No results for input(s): PROBNP in the last 8760 hours.  CBG:  Recent Labs Lab 06/18/14 0736 06/18/14 1203 06/18/14 1716 06/18/14 2131 06/19/14 0754  GLUCAP 101* 132* 175* 218* 108*       Signed:  Nita Sells  [facility] 06/19/2014, 10:19 AM

## 2014-06-20 ENCOUNTER — Inpatient Hospital Stay (HOSPITAL_COMMUNITY)
Admission: EM | Admit: 2014-06-20 | Discharge: 2014-06-25 | DRG: 193 | Disposition: A | Discharge status: Skilled Nursing Facility | Payer: Medicare Other | Attending: Internal Medicine | Admitting: Internal Medicine

## 2014-06-20 ENCOUNTER — Encounter (HOSPITAL_COMMUNITY): Payer: Self-pay | Admitting: *Deleted

## 2014-06-20 ENCOUNTER — Emergency Department (HOSPITAL_COMMUNITY): Payer: Medicare Other

## 2014-06-20 DIAGNOSIS — R0902 Hypoxemia: Secondary | ICD-10-CM | POA: Diagnosis present

## 2014-06-20 DIAGNOSIS — Z66 Do not resuscitate: Secondary | ICD-10-CM | POA: Diagnosis present

## 2014-06-20 DIAGNOSIS — Z7982 Long term (current) use of aspirin: Secondary | ICD-10-CM

## 2014-06-20 DIAGNOSIS — M2669 Other specified disorders of temporomandibular joint: Secondary | ICD-10-CM | POA: Diagnosis present

## 2014-06-20 DIAGNOSIS — Z833 Family history of diabetes mellitus: Secondary | ICD-10-CM

## 2014-06-20 DIAGNOSIS — W19XXXA Unspecified fall, initial encounter: Secondary | ICD-10-CM

## 2014-06-20 DIAGNOSIS — J209 Acute bronchitis, unspecified: Secondary | ICD-10-CM | POA: Diagnosis present

## 2014-06-20 DIAGNOSIS — Y92238 Other place in hospital as the place of occurrence of the external cause: Secondary | ICD-10-CM

## 2014-06-20 DIAGNOSIS — I482 Chronic atrial fibrillation: Secondary | ICD-10-CM | POA: Diagnosis present

## 2014-06-20 DIAGNOSIS — E876 Hypokalemia: Secondary | ICD-10-CM | POA: Diagnosis not present

## 2014-06-20 DIAGNOSIS — J40 Bronchitis, not specified as acute or chronic: Secondary | ICD-10-CM | POA: Diagnosis present

## 2014-06-20 DIAGNOSIS — X58XXXA Exposure to other specified factors, initial encounter: Secondary | ICD-10-CM | POA: Diagnosis present

## 2014-06-20 DIAGNOSIS — I10 Essential (primary) hypertension: Secondary | ICD-10-CM | POA: Diagnosis present

## 2014-06-20 DIAGNOSIS — Z79891 Long term (current) use of opiate analgesic: Secondary | ICD-10-CM

## 2014-06-20 DIAGNOSIS — Z8673 Personal history of transient ischemic attack (TIA), and cerebral infarction without residual deficits: Secondary | ICD-10-CM

## 2014-06-20 DIAGNOSIS — Z681 Body mass index (BMI) 19 or less, adult: Secondary | ICD-10-CM

## 2014-06-20 DIAGNOSIS — Z9049 Acquired absence of other specified parts of digestive tract: Secondary | ICD-10-CM | POA: Diagnosis present

## 2014-06-20 DIAGNOSIS — J9600 Acute respiratory failure, unspecified whether with hypoxia or hypercapnia: Secondary | ICD-10-CM | POA: Diagnosis not present

## 2014-06-20 DIAGNOSIS — I129 Hypertensive chronic kidney disease with stage 1 through stage 4 chronic kidney disease, or unspecified chronic kidney disease: Secondary | ICD-10-CM | POA: Diagnosis present

## 2014-06-20 DIAGNOSIS — E039 Hypothyroidism, unspecified: Secondary | ICD-10-CM | POA: Diagnosis present

## 2014-06-20 DIAGNOSIS — Z8249 Family history of ischemic heart disease and other diseases of the circulatory system: Secondary | ICD-10-CM

## 2014-06-20 DIAGNOSIS — J9601 Acute respiratory failure with hypoxia: Secondary | ICD-10-CM | POA: Diagnosis present

## 2014-06-20 DIAGNOSIS — Z7952 Long term (current) use of systemic steroids: Secondary | ICD-10-CM

## 2014-06-20 DIAGNOSIS — R296 Repeated falls: Secondary | ICD-10-CM | POA: Diagnosis present

## 2014-06-20 DIAGNOSIS — M810 Age-related osteoporosis without current pathological fracture: Secondary | ICD-10-CM | POA: Diagnosis present

## 2014-06-20 DIAGNOSIS — S030XXA Dislocation of jaw, initial encounter: Secondary | ICD-10-CM | POA: Diagnosis present

## 2014-06-20 DIAGNOSIS — Z5189 Encounter for other specified aftercare: Secondary | ICD-10-CM

## 2014-06-20 DIAGNOSIS — Z79899 Other long term (current) drug therapy: Secondary | ICD-10-CM

## 2014-06-20 DIAGNOSIS — W06XXXA Fall from bed, initial encounter: Secondary | ICD-10-CM | POA: Diagnosis not present

## 2014-06-20 DIAGNOSIS — Z993 Dependence on wheelchair: Secondary | ICD-10-CM

## 2014-06-20 DIAGNOSIS — E785 Hyperlipidemia, unspecified: Secondary | ICD-10-CM | POA: Diagnosis present

## 2014-06-20 DIAGNOSIS — R6884 Jaw pain: Secondary | ICD-10-CM | POA: Diagnosis not present

## 2014-06-20 DIAGNOSIS — J45901 Unspecified asthma with (acute) exacerbation: Secondary | ICD-10-CM | POA: Diagnosis present

## 2014-06-20 DIAGNOSIS — Z950 Presence of cardiac pacemaker: Secondary | ICD-10-CM

## 2014-06-20 DIAGNOSIS — S0300XA Dislocation of jaw, unspecified side, initial encounter: Secondary | ICD-10-CM | POA: Diagnosis present

## 2014-06-20 DIAGNOSIS — I4891 Unspecified atrial fibrillation: Secondary | ICD-10-CM | POA: Diagnosis present

## 2014-06-20 DIAGNOSIS — J181 Lobar pneumonia, unspecified organism: Principal | ICD-10-CM | POA: Diagnosis present

## 2014-06-20 DIAGNOSIS — J45909 Unspecified asthma, uncomplicated: Secondary | ICD-10-CM | POA: Diagnosis present

## 2014-06-20 DIAGNOSIS — T17990A Other foreign object in respiratory tract, part unspecified in causing asphyxiation, initial encounter: Secondary | ICD-10-CM | POA: Diagnosis present

## 2014-06-20 DIAGNOSIS — N189 Chronic kidney disease, unspecified: Secondary | ICD-10-CM | POA: Diagnosis present

## 2014-06-20 DIAGNOSIS — I480 Paroxysmal atrial fibrillation: Secondary | ICD-10-CM | POA: Diagnosis present

## 2014-06-20 DIAGNOSIS — F418 Other specified anxiety disorders: Secondary | ICD-10-CM | POA: Diagnosis present

## 2014-06-20 DIAGNOSIS — E43 Unspecified severe protein-calorie malnutrition: Secondary | ICD-10-CM | POA: Diagnosis present

## 2014-06-20 DIAGNOSIS — Z880 Allergy status to penicillin: Secondary | ICD-10-CM

## 2014-06-20 LAB — CBC WITH DIFFERENTIAL/PLATELET
Basophils Absolute: 0 10*3/uL (ref 0.0–0.1)
Basophils Relative: 0 % (ref 0–1)
EOS ABS: 1.7 10*3/uL — AB (ref 0.0–0.7)
EOS PCT: 12 % — AB (ref 0–5)
HCT: 50.3 % — ABNORMAL HIGH (ref 36.0–46.0)
HEMOGLOBIN: 16 g/dL — AB (ref 12.0–15.0)
LYMPHS PCT: 18 % (ref 12–46)
Lymphs Abs: 2.5 10*3/uL (ref 0.7–4.0)
MCH: 28.2 pg (ref 26.0–34.0)
MCHC: 31.8 g/dL (ref 30.0–36.0)
MCV: 88.6 fL (ref 78.0–100.0)
Monocytes Absolute: 1.4 10*3/uL — ABNORMAL HIGH (ref 0.1–1.0)
Monocytes Relative: 10 % (ref 3–12)
NEUTROS PCT: 60 % (ref 43–77)
Neutro Abs: 8.7 10*3/uL — ABNORMAL HIGH (ref 1.7–7.7)
Platelets: 381 10*3/uL (ref 150–400)
RBC: 5.68 MIL/uL — ABNORMAL HIGH (ref 3.87–5.11)
RDW: 14 % (ref 11.5–15.5)
WBC: 14.3 10*3/uL — ABNORMAL HIGH (ref 4.0–10.5)

## 2014-06-20 LAB — BASIC METABOLIC PANEL
Anion gap: 7 (ref 5–15)
BUN: 33 mg/dL — ABNORMAL HIGH (ref 6–23)
CO2: 31 mmol/L (ref 19–32)
CREATININE: 0.6 mg/dL (ref 0.50–1.10)
Calcium: 9.2 mg/dL (ref 8.4–10.5)
Chloride: 101 mmol/L (ref 96–112)
GFR, EST NON AFRICAN AMERICAN: 83 mL/min — AB (ref >90)
Glucose, Bld: 120 mg/dL — ABNORMAL HIGH (ref 70–99)
POTASSIUM: 3.3 mmol/L — AB (ref 3.5–5.1)
SODIUM: 139 mmol/L (ref 135–145)

## 2014-06-20 MED ORDER — IPRATROPIUM-ALBUTEROL 0.5-2.5 (3) MG/3ML IN SOLN
RESPIRATORY_TRACT | Status: AC
  Filled 2014-06-20: qty 3

## 2014-06-20 MED ORDER — IPRATROPIUM-ALBUTEROL 0.5-2.5 (3) MG/3ML IN SOLN
3.0000 mL | RESPIRATORY_TRACT | Status: DC
  Administered 2014-06-20 (×2): 3 mL via RESPIRATORY_TRACT
  Filled 2014-06-20 (×2): qty 3

## 2014-06-20 MED ORDER — METHYLPREDNISOLONE SODIUM SUCC 125 MG IJ SOLR
125.0000 mg | Freq: Once | INTRAMUSCULAR | Status: AC
  Administered 2014-06-20: 125 mg via INTRAVENOUS
  Filled 2014-06-20: qty 2

## 2014-06-20 MED ORDER — DIAZEPAM 5 MG/ML IJ SOLN
5.0000 mg | Freq: Once | INTRAMUSCULAR | Status: AC
  Administered 2014-06-20: 5 mg via INTRAVENOUS
  Filled 2014-06-20: qty 2

## 2014-06-20 MED ORDER — IOHEXOL 350 MG/ML SOLN
100.0000 mL | Freq: Once | INTRAVENOUS | Status: AC | PRN
  Administered 2014-06-20: 100 mL via INTRAVENOUS

## 2014-06-20 NOTE — ED Notes (Signed)
Pt's sats dropped to 89% on RA. Pt placed on 2L [place].

## 2014-06-20 NOTE — ED Notes (Addendum)
Patient was sent out from [facility] for lock jaw. According to EMS, nursing staff reports that her jaw was locked when the day shift arrived at 0700. Patient has history of lock jaw.

## 2014-06-20 NOTE — ED Notes (Signed)
Bed: WA08 Expected date:  Expected time:  Means of arrival:  Comments: EMS-jaw locked

## 2014-06-20 NOTE — Progress Notes (Signed)
CSW met with pt at bedside. There was no family present. Sitter was present. Patient was not able to effectively verbally communicate. She was able to communicate through head shaking gestures.  Patient confirms that she is from [facility]. She informed CSW that she has been living at the facility for 8 months. Also, pt shakes her head and confirms that she presents from [facility]. Patient states that she receives assistance with bathing while at the facility.   Patient also shook her head when asked if she falls often.  Patient shook her head no when asked if she had a support system.  Patient does not have any questions at this time. CSW informed sitter to inform a nurse if she felt that the pt would need to speak with her again.   Willette Brace [phone] ED CSW 06/20/2014 9:05 PM

## 2014-06-20 NOTE — ED Notes (Signed)
Drink and applesauce given to pt.  Daughter assisting pt

## 2014-06-20 NOTE — ED Notes (Signed)
RT Tammy contacted re neb tx

## 2014-06-20 NOTE — ED Notes (Signed)
Yvonne Buck EDPA notified that pt's daughter is wanting to know pt's dispo at this time.

## 2014-06-20 NOTE — H&P (Signed)
PCP: Blanchie Serve, MD  [facility]  Referring provider Darl Householder   Chief Complaint:  Jaw dislocation  HPI: Yvonne Buck is a 81 y.o. female   has a past medical history of Tachycardia-bradycardia syndrome; Paroxysmal atrial fibrillation; Hypertension; Frequent falls; Asthma; Hyperlipidemia; Depression; Anxiety; Pacemaker; Stroke; Osteoporosis (06/23/2013); Vitamin D deficiency; Sick sinus syndrome; Hypothyroidism; Chronic kidney disease; Fracture of greater trochanter of left femur (04/02/2013); Hip fracture, left (04/02/2013); Pelvic fracture (06/23/2013); Senile osteoporosis (02/26/2014); Rhinitis, allergic (02/26/2014); and TMJ (dislocation of temporomandibular joint) (06/16/2014).   Patient just recently had very similar presentation with jaw dislocation and was found to be wheezing in the emergency department she was admitted on 25th of April was started on prednisone taper for asthma exacerbation. During her hospitalization she was seen by speech pathology who recommended dysphagia 3 diet. Influenza panel was negative her hospital stay was complicated by steroid-induced hyperglycemia was treated with sliding scale.  At admission patient resides in [facility]. Today while eating patient had the same episode of jaw dislocation she was sent to emergency department where she had her jaws reduced. While in emergency department she was again noted to be hypoxic. The similar to prior admission. Patient was given oxygen and required 2 L. Given unclear etiology CT scan of the chest was obtained that showed evidence of bronchitis and mucus plugging. Given persistent shortness of breath hospitalist was called for admission. [place] emergency department patient became confused and got up she sustained a fall resulting in facial injury. CT scan of the face was negative for fractures.   Of note patient has history of paroxysmal atrial fibrillation not a candidate for anticoagulation due to  repeated falls.  Hospitalist was called for admission for hypoxia  Review of Systems:    Pertinent positives include: Shortness of breath cough  Constitutional:  No weight loss, night sweats, Fevers, chills, fatigue, weight loss  HEENT:  No headaches, Difficulty swallowing,Tooth/dental problems,Sore throat,  No sneezing, itching, ear ache, nasal congestion, post nasal drip,  Cardio-vascular:  No chest pain, Orthopnea, PND, anasarca, dizziness, palpitations.no Bilateral lower extremity swelling  GI:  No heartburn, indigestion, abdominal pain, nausea, vomiting, diarrhea, change in bowel habits, loss of appetite, melena, blood in stool, hematemesis Resp:    No excess mucus,  No coughing up of blood.No change in color of mucus.No wheezing. Skin:  no rash or lesions. No jaundice GU:  no dysuria, change in color of urine, no urgency or frequency. No straining to urinate.  No flank pain.  Musculoskeletal:  No joint pain or no joint swelling. No decreased range of motion. No back pain.  Psych:  No change in mood or affect. No depression or anxiety. No memory loss.  Neuro: no localizing neurological complaints, no tingling, no weakness, no double vision, no gait abnormality, no slurred speech, no confusion  Otherwise ROS are negative except for above, 10 systems were reviewed  Past Medical History: Past Medical History  Diagnosis Date  . Tachycardia-bradycardia syndrome     s/p [employer] PPM implant in [place]  . Paroxysmal atrial fibrillation     chads2vasc score is at least 6.  She is not a candidate for anticoagulation due to falls.  Her AF burden is low.  . Hypertension   . Frequent falls   . Asthma   . Hyperlipidemia   . Depression   . Anxiety   . Pacemaker   . Stroke   . Osteoporosis 06/23/2013  . Vitamin D deficiency   .  Sick sinus syndrome   . Hypothyroidism   . Chronic kidney disease   . Fracture of greater trochanter of left femur 04/02/2013  . Hip fracture, left  04/02/2013  . Pelvic fracture 06/23/2013  . Senile osteoporosis 02/26/2014  . Rhinitis, allergic 02/26/2014  . TMJ (dislocation of temporomandibular joint) 06/16/2014   Past Surgical History  Procedure Laterality Date  . Pacemaker insertion  01/04/2005    [place] PPM 1290 [phone]), GDT [phone] atrial lead and 4457 V lead all implanted in [place]  . Cholecystectomy    . Gallbladder surgery    . Tonsillectomy and adenoidectomy       Medications: Prior to Admission medications   Medication Sig Start Date End Date Taking? Authorizing Provider  albuterol (PROVENTIL HFA;VENTOLIN HFA) 108 (90 BASE) MCG/ACT inhaler Inhale 2 puffs into the lungs every 6 (six) hours as needed for wheezing or shortness of breath. 06/19/14  Yes Nita Sells, MD  amLODipine (NORVASC) 10 MG tablet Take 10 mg by mouth at bedtime.   Yes Historical Provider, MD  aspirin 81 MG chewable tablet Chew 81 mg by mouth daily.   Yes Historical Provider, MD  Fluticasone-Salmeterol (ADVAIR) 250-50 MCG/DOSE AEPB Inhale 1 puff into the lungs 2 (two) times daily.   Yes Historical Provider, MD  hydrochlorothiazide (HYDRODIURIL) 25 MG tablet Take 1 tablet (25 mg total) by mouth daily. 07/07/13  Yes Gerlene Fee, NP  HYDROcodone-acetaminophen (NORCO/VICODIN) 5-325 MG per tablet Take one tablet by mouth every 6 hours as needed for mild pain; Take two tablets by mouth every 6 hours as needed for moderate to severe pain Patient taking differently: Take 1 tablet by mouth every 6 (six) hours as needed for moderate pain or severe pain. *DO NOT EXCEED 4GM OF TYLENOL IN 24 HOURS* 06/17/13  Yes Tiffany L Reed, DO  isosorbide mononitrate (IMDUR) 30 MG 24 hr tablet Take 30 mg by mouth every morning.   Yes Historical Provider, MD  LORazepam (ATIVAN) 0.5 MG tablet Take One and 1/2 tablet by mouth three times daily for anxiety Patient taking differently: Take 0.5 mg by mouth 3 (three) times daily as needed for anxiety. Take One and 1/2  tablet by mouth three times daily for anxiety 04/17/14  Yes Lauree Chandler, NP  losartan (COZAAR) 100 MG tablet Take 100 mg by mouth daily.   Yes Historical Provider, MD  metoprolol (LOPRESSOR) 50 MG tablet Take 50 mg by mouth 2 (two) times daily.   Yes Historical Provider, MD  montelukast (SINGULAIR) 10 MG tablet Take 10 mg by mouth at bedtime.   Yes Historical Provider, MD  potassium chloride SA (K-DUR,KLOR-CON) 20 MEQ tablet Take 20 mEq by mouth daily.   Yes Historical Provider, MD  predniSONE (DELTASONE) 20 MG tablet Take 3 tablets (60 mg total) by mouth daily with breakfast. 06/19/14  Yes Nita Sells, MD  simvastatin (ZOCOR) 40 MG tablet Take 40 mg by mouth daily.   Yes Historical Provider, MD  calcium carbonate (CALCIUM 600) 600 MG TABS tablet Take 1 tablet (600 mg total) by mouth 2 (two) times daily with a meal. Patient not taking: Reported on 06/16/2014 06/23/13   Gerlene Fee, NP    Allergies:   Allergies  Allergen Reactions  . Penicillins Other (See Comments)    Red spots ### Tolerated Rocephin 12/2012 ###    Social History:  Ambulatory   wheelchair bound    From facility [facility]   reports that she has never smoked. She  has never used smokeless tobacco. She reports that she does not drink alcohol or use illicit drugs.    Family History: family history includes Diabetes in her mother; Heart Problems in her father; High blood pressure in her mother.    Physical Exam: Patient Vitals for the past 24 hrs:  BP Temp Temp src Pulse Resp SpO2  06/20/14 2225 (!) 150/49 mmHg - - 63 16 (!) 89 %  06/20/14 2208 135/56 mmHg - - 60 18 94 %  06/20/14 2206 - - - - - (!) 89 %  06/20/14 2006 152/66 mmHg - - 71 18 96 %  06/20/14 1956 - - - - - 94 %  06/20/14 1849 - - - - - 97 %  06/20/14 1827 - - - - - (!) 88 %  06/20/14 1731 119/56 mmHg - - 60 20 99 %  06/20/14 1429 138/57 mmHg 97.8 F (36.6 C) Oral (!) 59 18 93 %    1. General:  in No Acute distress 2.  Psychological: Alert but not fullyOriented 3. Head/ENT:     Dry Mucous Membranes                          Head Non traumatic, neck supple                            Poor Dentition 4. SKIN:  decreased Skin turgor,  Skin clean Dry and intact no rash 5. Heart: Regular rate and rhythm no Murmur, Rub or gallop 6. Lungs: Some wheezes no crackles   7. Abdomen: Soft, non-tender, Non distended 8. Lower extremities: no clubbing, cyanosis, or edema 9. Neurologically Grossly intact, moving all 4 extremities equally 10. MSK: Normal range of motion  body mass index is unknown because there is no weight on file.   Labs on Admission:   Results for orders placed or performed during the hospital encounter of 06/20/14 (from the past 24 hour(s))  CBC with Differential     Status: Abnormal   Collection Time: 06/20/14  4:33 PM  Result Value Ref Range   WBC 14.3 (H) 4.0 - 10.5 K/uL   RBC 5.68 (H) 3.87 - 5.11 MIL/uL   Hemoglobin 16.0 (H) 12.0 - 15.0 g/dL   HCT 50.3 (H) 36.0 - 46.0 %   MCV 88.6 78.0 - 100.0 fL   MCH 28.2 26.0 - 34.0 pg   MCHC 31.8 30.0 - 36.0 g/dL   RDW 14.0 11.5 - 15.5 %   Platelets 381 150 - 400 K/uL   Neutrophils Relative % 60 43 - 77 %   Neutro Abs 8.7 (H) 1.7 - 7.7 K/uL   Lymphocytes Relative 18 12 - 46 %   Lymphs Abs 2.5 0.7 - 4.0 K/uL   Monocytes Relative 10 3 - 12 %   Monocytes Absolute 1.4 (H) 0.1 - 1.0 K/uL   Eosinophils Relative 12 (H) 0 - 5 %   Eosinophils Absolute 1.7 (H) 0.0 - 0.7 K/uL   Basophils Relative 0 0 - 1 %   Basophils Absolute 0.0 0.0 - 0.1 K/uL  Basic metabolic panel     Status: Abnormal   Collection Time: 06/20/14  4:33 PM  Result Value Ref Range   Sodium 139 135 - 145 mmol/L   Potassium 3.3 (L) 3.5 - 5.1 mmol/L   Chloride 101 96 - 112 mmol/L   CO2 31 19 - 32 mmol/L   Glucose,  Bld 120 (H) 70 - 99 mg/dL   BUN 33 (H) 6 - 23 mg/dL   Creatinine, Ser 0.60 0.50 - 1.10 mg/dL   Calcium 9.2 8.4 - 10.5 mg/dL   GFR calc non Af Amer 83 (L) >90 mL/min   GFR  calc Af Amer >90 >90 mL/min   Anion gap 7 5 - 15    UA pending  Lab Results  Component Value Date   HGBA1C 6.2* 06/17/2014    Estimated Creatinine Clearance: 44.8 mL/min (by C-G formula based on Cr of 0.6).  BNP (last 3 results) No results for input(s): PROBNP in the last 8760 hours.  Other results:  I have pearsonaly reviewed this: ECG REPORT Not obtained  There were no vitals filed for this visit.   Cultures:    Component Value Date/Time   SDES URINE, CLEAN CATCH 12/31/2012 1150   SPECREQUEST NONE 12/31/2012 1150   CULT NO GROWTH Performed at [facility] 12/31/2012 1150   REPTSTATUS 01/01/2013 FINAL 12/31/2012 1150     Radiological Exams on Admission: Dg Chest 2 View  06/19/2014   CLINICAL DATA:  Weakness.  Evaluate for pneumonia.  EXAM: CHEST  2 VIEW  COMPARISON:  06/16/2014  FINDINGS: The lungs are underinflated. Additionally, the images were acquired with the patient sitting and in the AP orientation. There is a left chest wall pacer device with lead in the right atrial appendage and right ventricle. Normal heart size. No pleural effusion or edema. No airspace consolidation identified.  IMPRESSION: 1. Diminished exam detail due to low lung volumes. 2. No pneumonia identified. There is a continued concern for infection then a repeat chest radiograph in the standing PA projection should be obtained.   Electronically Signed   By: Kerby Moors M.D.   On: 06/19/2014 09:34   Ct Head Wo Contrast  06/20/2014   CLINICAL DATA:  Acute onset of lockjaw. Concern for head or cervical spine injury. Initial encounter.  EXAM: CT HEAD WITHOUT CONTRAST  CT MAXILLOFACIAL WITHOUT CONTRAST  CT CERVICAL SPINE WITHOUT CONTRAST  TECHNIQUE: Multidetector CT imaging of the head, cervical spine, and maxillofacial structures were performed using the standard protocol without intravenous contrast. Multiplanar CT image reconstructions of the cervical spine and maxillofacial structures were  also generated.  COMPARISON:  None.  FINDINGS: CT HEAD FINDINGS  There is no evidence of acute infarction, mass lesion, or intra- or extra-axial hemorrhage on CT.  Scattered periventricular and subcortical white matter change likely reflects small vessel ischemic microangiopathy. Mild cortical volume loss is noted, with prominence of the ventricles and sulci. Small chronic lacunar infarcts are seen at the basal ganglia bilaterally.  The brainstem and fourth ventricle are within normal limits. The cerebral hemispheres demonstrate grossly normal gray-white differentiation. No mass effect or midline shift is seen.  There is no evidence of fracture; visualized osseous structures are unremarkable in appearance. The orbits are within normal limits. The paranasal sinuses and mastoid air cells are well-aerated. Soft tissue swelling is noted overlying the left frontal calvarium.  CT MAXILLOFACIAL FINDINGS  There is no evidence of fracture or dislocation. The maxilla and mandible appear intact. The nasal bone is unremarkable in appearance. The visualized dentition demonstrates no acute abnormality.  The orbits are intact bilaterally. The visualized paranasal sinuses and mastoid air cells are well-aerated.  Dense calcification is noted at the carotid bifurcations bilaterally. Soft tissue swelling is noted overlying the left frontal calvarium. The parapharyngeal fat planes are preserved. The nasopharynx, oropharynx and hypopharynx are unremarkable  in appearance. The visualized portions of the valleculae and piriform sinuses are grossly unremarkable.  The parotid and submandibular glands are within normal limits. No cervical lymphadenopathy is seen.  CT CERVICAL SPINE FINDINGS  There is no evidence of fracture or subluxation. Vertebral bodies demonstrate normal height and alignment. Minimal chronic endplate irregularity is noted at the mid cervical spine, and a small posterior disc osteophyte complex is seen at C6-C7.  Intervertebral disc spaces are preserved. Prevertebral soft tissues are within normal limits.  The thyroid gland is unremarkable in appearance. The visualized lung apices are clear. No significant soft tissue abnormalities are seen.  IMPRESSION: 1. No evidence of traumatic intracranial injury or fracture. 2. No evidence of fracture or dislocation with regard to the maxillofacial structures. 3. No evidence of fracture or subluxation along the cervical spine. 4. Soft tissue swelling noted overlying the left frontal calvarium. 5. Mild cortical volume loss and scattered small vessel ischemic microangiopathy. Small chronic lacunar infarcts at the basal ganglia bilaterally. 6. Dense calcification at the carotid bifurcations bilaterally. 7. Minimal degenerative change at the mid to lower cervical spine.   Electronically Signed   By: Garald Balding M.D.   On: 06/20/2014 22:07   Ct Cervical Spine Wo Contrast  06/20/2014   CLINICAL DATA:  Acute onset of lockjaw. Concern for head or cervical spine injury. Initial encounter.  EXAM: CT HEAD WITHOUT CONTRAST  CT MAXILLOFACIAL WITHOUT CONTRAST  CT CERVICAL SPINE WITHOUT CONTRAST  TECHNIQUE: Multidetector CT imaging of the head, cervical spine, and maxillofacial structures were performed using the standard protocol without intravenous contrast. Multiplanar CT image reconstructions of the cervical spine and maxillofacial structures were also generated.  COMPARISON:  None.  FINDINGS: CT HEAD FINDINGS  There is no evidence of acute infarction, mass lesion, or intra- or extra-axial hemorrhage on CT.  Scattered periventricular and subcortical white matter change likely reflects small vessel ischemic microangiopathy. Mild cortical volume loss is noted, with prominence of the ventricles and sulci. Small chronic lacunar infarcts are seen at the basal ganglia bilaterally.  The brainstem and fourth ventricle are within normal limits. The cerebral hemispheres demonstrate grossly normal  gray-white differentiation. No mass effect or midline shift is seen.  There is no evidence of fracture; visualized osseous structures are unremarkable in appearance. The orbits are within normal limits. The paranasal sinuses and mastoid air cells are well-aerated. Soft tissue swelling is noted overlying the left frontal calvarium.  CT MAXILLOFACIAL FINDINGS  There is no evidence of fracture or dislocation. The maxilla and mandible appear intact. The nasal bone is unremarkable in appearance. The visualized dentition demonstrates no acute abnormality.  The orbits are intact bilaterally. The visualized paranasal sinuses and mastoid air cells are well-aerated.  Dense calcification is noted at the carotid bifurcations bilaterally. Soft tissue swelling is noted overlying the left frontal calvarium. The parapharyngeal fat planes are preserved. The nasopharynx, oropharynx and hypopharynx are unremarkable in appearance. The visualized portions of the valleculae and piriform sinuses are grossly unremarkable.  The parotid and submandibular glands are within normal limits. No cervical lymphadenopathy is seen.  CT CERVICAL SPINE FINDINGS  There is no evidence of fracture or subluxation. Vertebral bodies demonstrate normal height and alignment. Minimal chronic endplate irregularity is noted at the mid cervical spine, and a small posterior disc osteophyte complex is seen at C6-C7. Intervertebral disc spaces are preserved. Prevertebral soft tissues are within normal limits.  The thyroid gland is unremarkable in appearance. The visualized lung apices are clear. No significant soft tissue  abnormalities are seen.  IMPRESSION: 1. No evidence of traumatic intracranial injury or fracture. 2. No evidence of fracture or dislocation with regard to the maxillofacial structures. 3. No evidence of fracture or subluxation along the cervical spine. 4. Soft tissue swelling noted overlying the left frontal calvarium. 5. Mild cortical volume loss  and scattered small vessel ischemic microangiopathy. Small chronic lacunar infarcts at the basal ganglia bilaterally. 6. Dense calcification at the carotid bifurcations bilaterally. 7. Minimal degenerative change at the mid to lower cervical spine.   Electronically Signed   By: Garald Balding M.D.   On: 06/20/2014 22:07   Dg Chest Portable 1 View  06/20/2014   CLINICAL DATA:  TMJ dysfunction.  Locked jaw.  EXAM: PORTABLE CHEST - 1 VIEW  COMPARISON:  06/19/2014  FINDINGS: Dual lead pacer remains in place. Heart size within normal limits. Tortuous thoracic aorta.  Low lung volumes are present, causing crowding of the pulmonary vasculature. Reverse lordotic projection.  The lungs appear clear. Right upper quadrant clips likely from prior cholecystectomy.  Barium contrast medium noted in the colon. This is likely left over from the patient's recent swallowing function study performed on 06/18/2014.  IMPRESSION: 1. The lungs appear clear.  Heart size within normal limits. 2. Dual lead pacer in place.   Electronically Signed   By: Van Clines M.D.   On: 06/20/2014 16:38   [place] Cm  06/20/2014   CLINICAL DATA:  Acute onset of lockjaw. Concern for head or cervical spine injury. Initial encounter.  EXAM: CT HEAD WITHOUT CONTRAST  CT MAXILLOFACIAL WITHOUT CONTRAST  CT CERVICAL SPINE WITHOUT CONTRAST  TECHNIQUE: Multidetector CT imaging of the head, cervical spine, and maxillofacial structures were performed using the standard protocol without intravenous contrast. Multiplanar CT image reconstructions of the cervical spine and maxillofacial structures were also generated.  COMPARISON:  None.  FINDINGS: CT HEAD FINDINGS  There is no evidence of acute infarction, mass lesion, or intra- or extra-axial hemorrhage on CT.  Scattered periventricular and subcortical white matter change likely reflects small vessel ischemic microangiopathy. Mild cortical volume loss is noted, with prominence of the  ventricles and sulci. Small chronic lacunar infarcts are seen at the basal ganglia bilaterally.  The brainstem and fourth ventricle are within normal limits. The cerebral hemispheres demonstrate grossly normal gray-white differentiation. No mass effect or midline shift is seen.  There is no evidence of fracture; visualized osseous structures are unremarkable in appearance. The orbits are within normal limits. The paranasal sinuses and mastoid air cells are well-aerated. Soft tissue swelling is noted overlying the left frontal calvarium.  CT MAXILLOFACIAL FINDINGS  There is no evidence of fracture or dislocation. The maxilla and mandible appear intact. The nasal bone is unremarkable in appearance. The visualized dentition demonstrates no acute abnormality.  The orbits are intact bilaterally. The visualized paranasal sinuses and mastoid air cells are well-aerated.  Dense calcification is noted at the carotid bifurcations bilaterally. Soft tissue swelling is noted overlying the left frontal calvarium. The parapharyngeal fat planes are preserved. The nasopharynx, oropharynx and hypopharynx are unremarkable in appearance. The visualized portions of the valleculae and piriform sinuses are grossly unremarkable.  The parotid and submandibular glands are within normal limits. No cervical lymphadenopathy is seen.  CT CERVICAL SPINE FINDINGS  There is no evidence of fracture or subluxation. Vertebral bodies demonstrate normal height and alignment. Minimal chronic endplate irregularity is noted at the mid cervical spine, and a small posterior disc osteophyte complex is seen at C6-C7.  Intervertebral disc spaces are preserved. Prevertebral soft tissues are within normal limits.  The thyroid gland is unremarkable in appearance. The visualized lung apices are clear. No significant soft tissue abnormalities are seen.  IMPRESSION: 1. No evidence of traumatic intracranial injury or fracture. 2. No evidence of fracture or dislocation  with regard to the maxillofacial structures. 3. No evidence of fracture or subluxation along the cervical spine. 4. Soft tissue swelling noted overlying the left frontal calvarium. 5. Mild cortical volume loss and scattered small vessel ischemic microangiopathy. Small chronic lacunar infarcts at the basal ganglia bilaterally. 6. Dense calcification at the carotid bifurcations bilaterally. 7. Minimal degenerative change at the mid to lower cervical spine.   Electronically Signed   By: Garald Balding M.D.   On: 06/20/2014 22:07    Chart has been reviewed  Family not  at  Bedside    Assessment/Plan 81 year old female with recurrent jaw dislocation presented with another episode was found to be hypoxic have Requiring oxygen patient is originally from [facility] had very similar admission just 2 days ago. CT chest showing some evidence of bronchitis but no PE Present on Admission:  . Acute respiratory failure with hypoxia - most likely secondary to bronchitis patient will likely need to go back to [facility] on oxygen. Given recurrent admission and evidence of bronchitis Will treat with antibiotics. Continue prednisone continue nebulizers  . Essential hypertension, benign hold hydrochlorothiazide given some evidence of hemoconcentration change metoprolol to Zebeta given reactive airway disease  . Hypokalemia will replace  . Chronic asthma continue albuterol and scheduled Atrovent  . TMJ (dislocation of temporomandibular joint) has been reviewed these by ER  . Protein-calorie malnutrition dietary assessment and order prealbumin  . Atrial fibrillation not on anticoagulation continue beta blocker     Prophylaxis: hep [place]  CODE STATUS:  FULL CODE presumed to be until able to discuss with family  Disposition:                            Back to current facility. [place] when stable                       Other plan as per orders.  I have spent a total of 55 min on this  admission  Daun Rens 06/20/2014, 11:45 PM  [facility]  Pager [phone]   after 2 AM please page floor coverage PA If 7AM-7PM, please contact the day team taking care of the patient  [email or web address]  Password [email or web address]

## 2014-06-20 NOTE — ED Notes (Addendum)
Pt's daughter Janae Bridgeman [phone].  Would like to know pt's dispo

## 2014-06-20 NOTE — ED Provider Notes (Signed)
CSN: [id number]     Arrival date & time 06/20/14  1428 History   First MD Initiated Contact with Patient 06/20/14 1504     Chief Complaint  Patient presents with  . Jaw Pain     (Consider location/radiation/quality/duration/timing/severity/associated sxs/prior Treatment) HPI  Pt is an 81yo female brought to ED from [facility] for lock jaw.  Per triage note, nursing staff reports her jaw was locked when the day shift arrived at 0700 this morning.  Pt has previous hx of lock jaw.  Pt is unable to speak due to significant pain moving her jaw, however, pt is able to answer "yes" and "no" questions.  Pt reports waking up with her jaw dislocated. Denies falling or known injury. Denies biting into something or yawing prior to symptom onset.  Pt would like something for pain, she has not received any pain medication PTA.  Per medical records, pt was seen on 06/16/14, jaw reduced by closed reduction of mandible dislocation.  Inferior and posterior pressure was applied for successful reduction of dislocation, no anesthetic or sedation was needed a that time.    Per notes from [employer] , pt's daughter who is also her POA, [name] Glennon Mac was notified of pt's condition and agreed to have pt come to ED for treatment.  Dr. Bubba Camp at the facility was also notified of pt's condition.  It has been noted in pt's chart that family initially declined surgical correction, however, it is believed at this point, risk not performing surgery may be outweigh risk of performing surgery due to increased frequency and recurrence of jaw dislocation.  Facility notes pt sounds congested again and worried pt may continue to aspirate as she is at increased risk with recurrent jaw dislocations.  Pt was admitted 06/16/14-06/19/14 for bilateral jaw dislocation as well as asthma exacerbation with hypoxia secondary to pneumonia.    Past Medical History  Diagnosis Date  . Tachycardia-bradycardia syndrome     s/p [employer] PPM  implant in [place]  . Paroxysmal atrial fibrillation     chads2vasc score is at least 6.  She is not a candidate for anticoagulation due to falls.  Her AF burden is low.  . Hypertension   . Frequent falls   . Asthma   . Hyperlipidemia   . Depression   . Anxiety   . Pacemaker   . Stroke   . Osteoporosis 06/23/2013  . Vitamin D deficiency   . Sick sinus syndrome   . Hypothyroidism   . Chronic kidney disease   . Fracture of greater trochanter of left femur 04/02/2013  . Hip fracture, left 04/02/2013  . Pelvic fracture 06/23/2013  . Senile osteoporosis 02/26/2014  . Rhinitis, allergic 02/26/2014  . TMJ (dislocation of temporomandibular joint) 06/16/2014   Past Surgical History  Procedure Laterality Date  . Pacemaker insertion  01/04/2005    [place] PPM 1290 [phone]), GDT [phone] atrial lead and 4457 V lead all implanted in [place]  . Cholecystectomy    . Gallbladder surgery    . Tonsillectomy and adenoidectomy     Family History  Problem Relation Age of Onset  . Diabetes Mother   . High blood pressure Mother   . Heart Problems Father    History  Substance Use Topics  . Smoking status: Never Smoker   . Smokeless tobacco: Never Used  . Alcohol Use: No   OB History    No data available     Review of Systems  Constitutional:  Negative for fever and chills.  HENT: Positive for dental problem, drooling ( cannot properly close mouth to swallow saliva ) and voice change ( unable to speak due to jaw dislocation). Negative for congestion and sore throat.        Jaw dislocation  Respiratory: Negative for apnea, choking and shortness of breath.   Skin: Negative for rash and wound.  All other systems reviewed and are negative.     Allergies  Penicillins  Home Medications   Prior to Admission medications   Medication Sig Start Date End Date Taking? Authorizing Provider  albuterol (PROVENTIL HFA;VENTOLIN HFA) 108 (90 BASE) MCG/ACT inhaler Inhale 2 puffs into the lungs every  6 (six) hours as needed for wheezing or shortness of breath. 06/19/14  Yes Nita Sells, MD  amLODipine (NORVASC) 10 MG tablet Take 10 mg by mouth at bedtime.   Yes Historical Provider, MD  aspirin 81 MG chewable tablet Chew 81 mg by mouth daily.   Yes Historical Provider, MD  Fluticasone-Salmeterol (ADVAIR) 250-50 MCG/DOSE AEPB Inhale 1 puff into the lungs 2 (two) times daily.   Yes Historical Provider, MD  hydrochlorothiazide (HYDRODIURIL) 25 MG tablet Take 1 tablet (25 mg total) by mouth daily. 07/07/13  Yes Gerlene Fee, NP  HYDROcodone-acetaminophen (NORCO/VICODIN) 5-325 MG per tablet Take one tablet by mouth every 6 hours as needed for mild pain; Take two tablets by mouth every 6 hours as needed for moderate to severe pain Patient taking differently: Take 1 tablet by mouth every 6 (six) hours as needed for moderate pain or severe pain. *DO NOT EXCEED 4GM OF TYLENOL IN 24 HOURS* 06/17/13  Yes Tiffany L Reed, DO  isosorbide mononitrate (IMDUR) 30 MG 24 hr tablet Take 30 mg by mouth every morning.   Yes Historical Provider, MD  LORazepam (ATIVAN) 0.5 MG tablet Take One and 1/2 tablet by mouth three times daily for anxiety Patient taking differently: Take 0.5 mg by mouth 3 (three) times daily as needed for anxiety. Take One and 1/2 tablet by mouth three times daily for anxiety 04/17/14  Yes Lauree Chandler, NP  losartan (COZAAR) 100 MG tablet Take 100 mg by mouth daily.   Yes Historical Provider, MD  metoprolol (LOPRESSOR) 50 MG tablet Take 50 mg by mouth 2 (two) times daily.   Yes Historical Provider, MD  montelukast (SINGULAIR) 10 MG tablet Take 10 mg by mouth at bedtime.   Yes Historical Provider, MD  potassium chloride SA (K-DUR,KLOR-CON) 20 MEQ tablet Take 20 mEq by mouth daily.   Yes Historical Provider, MD  predniSONE (DELTASONE) 20 MG tablet Take 3 tablets (60 mg total) by mouth daily with breakfast. 06/19/14  Yes Nita Sells, MD  simvastatin (ZOCOR) 40 MG tablet Take 40 mg by  mouth daily.   Yes Historical Provider, MD  calcium carbonate (CALCIUM 600) 600 MG TABS tablet Take 1 tablet (600 mg total) by mouth 2 (two) times daily with a meal. Patient not taking: Reported on 06/16/2014 06/23/13   Gerlene Fee, NP   BP 150/49 mmHg  Pulse 63  Temp(Src) 97.8 F (36.6 C) (Oral)  Resp 16  SpO2 89% Physical Exam  Constitutional: She appears well-developed and well-nourished. She appears distressed.  Pt lying in exam bed appears anxious, mouth wide open.   HENT:  Head: Normocephalic and atraumatic.  Nose: Mucosal edema and rhinorrhea present.  Mouth/Throat: Uvula is midline, oropharynx is clear and moist and mucous membranes are normal. There is trismus in the jaw. Abnormal  dentition.  Mouth in opened position, lower jaw protruding. Minimal movement of lower jaw. Unable to close jaw. Unable to move jaw w/o significant pain.   Eyes: EOM are normal.  Neck: Normal range of motion. Neck supple.  Cardiovascular: Normal rate.   Pulmonary/Chest: Effort normal.  Musculoskeletal: Normal range of motion.  Neurological: She is alert.  Pt is alert, able to answer 'yes' and 'no' questions by shaking her head and mumbling. Cannot speak due to jaw dislocation.   Skin: Skin is warm and dry.  Psychiatric: Her behavior is normal. Her mood appears anxious.  Nursing note and vitals reviewed.   ED Course  Procedures   Jaw reduced by closed reduction, see procedure note by Dr. Darl Householder.    Labs Review Labs Reviewed  CBC WITH DIFFERENTIAL/PLATELET - Abnormal; Notable for the following:    WBC 14.3 (*)    RBC 5.68 (*)    Hemoglobin 16.0 (*)    HCT 50.3 (*)    Neutro Abs 8.7 (*)    Monocytes Absolute 1.4 (*)    Eosinophils Relative 12 (*)    Eosinophils Absolute 1.7 (*)    All other components within normal limits  BASIC METABOLIC PANEL - Abnormal; Notable for the following:    Potassium 3.3 (*)    Glucose, Bld 120 (*)    BUN 33 (*)    GFR calc non Af Amer 83 (*)    All other  components within normal limits    Imaging Review Dg Chest 2 View  06/19/2014   CLINICAL DATA:  Weakness.  Evaluate for pneumonia.  EXAM: CHEST  2 VIEW  COMPARISON:  06/16/2014  FINDINGS: The lungs are underinflated. Additionally, the images were acquired with the patient sitting and in the AP orientation. There is a left chest wall pacer device with lead in the right atrial appendage and right ventricle. Normal heart size. No pleural effusion or edema. No airspace consolidation identified.  IMPRESSION: 1. Diminished exam detail due to low lung volumes. 2. No pneumonia identified. There is a continued concern for infection then a repeat chest radiograph in the standing PA projection should be obtained.   Electronically Signed   By: Kerby Moors M.D.   On: 06/19/2014 09:34   Ct Head Wo Contrast  06/20/2014   CLINICAL DATA:  Acute onset of lockjaw. Concern for head or cervical spine injury. Initial encounter.  EXAM: CT HEAD WITHOUT CONTRAST  CT MAXILLOFACIAL WITHOUT CONTRAST  CT CERVICAL SPINE WITHOUT CONTRAST  TECHNIQUE: Multidetector CT imaging of the head, cervical spine, and maxillofacial structures were performed using the standard protocol without intravenous contrast. Multiplanar CT image reconstructions of the cervical spine and maxillofacial structures were also generated.  COMPARISON:  None.  FINDINGS: CT HEAD FINDINGS  There is no evidence of acute infarction, mass lesion, or intra- or extra-axial hemorrhage on CT.  Scattered periventricular and subcortical white matter change likely reflects small vessel ischemic microangiopathy. Mild cortical volume loss is noted, with prominence of the ventricles and sulci. Small chronic lacunar infarcts are seen at the basal ganglia bilaterally.  The brainstem and fourth ventricle are within normal limits. The cerebral hemispheres demonstrate grossly normal gray-white differentiation. No mass effect or midline shift is seen.  There is no evidence of fracture;  visualized osseous structures are unremarkable in appearance. The orbits are within normal limits. The paranasal sinuses and mastoid air cells are well-aerated. Soft tissue swelling is noted overlying the left frontal calvarium.  CT MAXILLOFACIAL FINDINGS  There is  no evidence of fracture or dislocation. The maxilla and mandible appear intact. The nasal bone is unremarkable in appearance. The visualized dentition demonstrates no acute abnormality.  The orbits are intact bilaterally. The visualized paranasal sinuses and mastoid air cells are well-aerated.  Dense calcification is noted at the carotid bifurcations bilaterally. Soft tissue swelling is noted overlying the left frontal calvarium. The parapharyngeal fat planes are preserved. The nasopharynx, oropharynx and hypopharynx are unremarkable in appearance. The visualized portions of the valleculae and piriform sinuses are grossly unremarkable.  The parotid and submandibular glands are within normal limits. No cervical lymphadenopathy is seen.  CT CERVICAL SPINE FINDINGS  There is no evidence of fracture or subluxation. Vertebral bodies demonstrate normal height and alignment. Minimal chronic endplate irregularity is noted at the mid cervical spine, and a small posterior disc osteophyte complex is seen at C6-C7. Intervertebral disc spaces are preserved. Prevertebral soft tissues are within normal limits.  The thyroid gland is unremarkable in appearance. The visualized lung apices are clear. No significant soft tissue abnormalities are seen.  IMPRESSION: 1. No evidence of traumatic intracranial injury or fracture. 2. No evidence of fracture or dislocation with regard to the maxillofacial structures. 3. No evidence of fracture or subluxation along the cervical spine. 4. Soft tissue swelling noted overlying the left frontal calvarium. 5. Mild cortical volume loss and scattered small vessel ischemic microangiopathy. Small chronic lacunar infarcts at the basal ganglia  bilaterally. 6. Dense calcification at the carotid bifurcations bilaterally. 7. Minimal degenerative change at the mid to lower cervical spine.   Electronically Signed   By: Garald Balding M.D.   On: 06/20/2014 22:07   Ct Angio Chest Pe W/cm &/or Wo Cm  06/21/2014   CLINICAL DATA:  Hypoxia. Nonverbal patient, history of hypertension, tachycardia, bradycardia syndrome.  EXAM: CT ANGIOGRAPHY CHEST WITH CONTRAST  TECHNIQUE: Multidetector CT imaging of the chest was performed using the standard protocol during bolus administration of intravenous contrast. Multiplanar CT image reconstructions and MIPs were obtained to evaluate the vascular anatomy.  CONTRAST:  148mL OMNIPAQUE IOHEXOL 350 MG/ML SOLN  COMPARISON:  Chest radiograph June 20, 2014 at 1622 hours  FINDINGS: Adequate pulmonary arterial contrast opacification. Main pulmonary artery is not enlarged. No pulmonary arterial filling defects to the level of the segmental branches though, respiratory motion degrades sensitivity for distal emboli. Heart size is normal. No pericardial fluid collections. Thoracic aorta is normal in course and caliber with mild to moderate calcific atherosclerosis.  No pleural effusion, focal consolidation. No pneumothorax. Bronchial wall thickening in the lung bases with probable mucous impaction.  Included view of the abdomen is normal. Soft tissues are normal. No destructive bony lesions. Respiratory motion results in spurious appearance of rib fractures. Moderate degenerative change of the shoulders. LEFT cardiac pacemaker results in streak artifact.  Review of the MIP images confirms the above findings.  IMPRESSION: No acute pulmonary embolism on this respiratory motion degraded examination.  Bronchial wall thickening may reflect bronchitis, with probable mucous impaction in the lower lobes without focal consolidation.   Electronically Signed   By: Elon Alas   On: 06/21/2014 00:09   Ct Cervical Spine Wo  Contrast  06/20/2014   CLINICAL DATA:  Acute onset of lockjaw. Concern for head or cervical spine injury. Initial encounter.  EXAM: CT HEAD WITHOUT CONTRAST  CT MAXILLOFACIAL WITHOUT CONTRAST  CT CERVICAL SPINE WITHOUT CONTRAST  TECHNIQUE: Multidetector CT imaging of the head, cervical spine, and maxillofacial structures were performed using the standard protocol without intravenous  contrast. Multiplanar CT image reconstructions of the cervical spine and maxillofacial structures were also generated.  COMPARISON:  None.  FINDINGS: CT HEAD FINDINGS  There is no evidence of acute infarction, mass lesion, or intra- or extra-axial hemorrhage on CT.  Scattered periventricular and subcortical white matter change likely reflects small vessel ischemic microangiopathy. Mild cortical volume loss is noted, with prominence of the ventricles and sulci. Small chronic lacunar infarcts are seen at the basal ganglia bilaterally.  The brainstem and fourth ventricle are within normal limits. The cerebral hemispheres demonstrate grossly normal gray-white differentiation. No mass effect or midline shift is seen.  There is no evidence of fracture; visualized osseous structures are unremarkable in appearance. The orbits are within normal limits. The paranasal sinuses and mastoid air cells are well-aerated. Soft tissue swelling is noted overlying the left frontal calvarium.  CT MAXILLOFACIAL FINDINGS  There is no evidence of fracture or dislocation. The maxilla and mandible appear intact. The nasal bone is unremarkable in appearance. The visualized dentition demonstrates no acute abnormality.  The orbits are intact bilaterally. The visualized paranasal sinuses and mastoid air cells are well-aerated.  Dense calcification is noted at the carotid bifurcations bilaterally. Soft tissue swelling is noted overlying the left frontal calvarium. The parapharyngeal fat planes are preserved. The nasopharynx, oropharynx and hypopharynx are unremarkable  in appearance. The visualized portions of the valleculae and piriform sinuses are grossly unremarkable.  The parotid and submandibular glands are within normal limits. No cervical lymphadenopathy is seen.  CT CERVICAL SPINE FINDINGS  There is no evidence of fracture or subluxation. Vertebral bodies demonstrate normal height and alignment. Minimal chronic endplate irregularity is noted at the mid cervical spine, and a small posterior disc osteophyte complex is seen at C6-C7. Intervertebral disc spaces are preserved. Prevertebral soft tissues are within normal limits.  The thyroid gland is unremarkable in appearance. The visualized lung apices are clear. No significant soft tissue abnormalities are seen.  IMPRESSION: 1. No evidence of traumatic intracranial injury or fracture. 2. No evidence of fracture or dislocation with regard to the maxillofacial structures. 3. No evidence of fracture or subluxation along the cervical spine. 4. Soft tissue swelling noted overlying the left frontal calvarium. 5. Mild cortical volume loss and scattered small vessel ischemic microangiopathy. Small chronic lacunar infarcts at the basal ganglia bilaterally. 6. Dense calcification at the carotid bifurcations bilaterally. 7. Minimal degenerative change at the mid to lower cervical spine.   Electronically Signed   By: Garald Balding M.D.   On: 06/20/2014 22:07   Dg Chest Portable 1 View  06/20/2014   CLINICAL DATA:  TMJ dysfunction.  Locked jaw.  EXAM: PORTABLE CHEST - 1 VIEW  COMPARISON:  06/19/2014  FINDINGS: Dual lead pacer remains in place. Heart size within normal limits. Tortuous thoracic aorta.  Low lung volumes are present, causing crowding of the pulmonary vasculature. Reverse lordotic projection.  The lungs appear clear. Right upper quadrant clips likely from prior cholecystectomy.  Barium contrast medium noted in the colon. This is likely left over from the patient's recent swallowing function study performed on 06/18/2014.   IMPRESSION: 1. The lungs appear clear.  Heart size within normal limits. 2. Dual lead pacer in place.   Electronically Signed   By: Van Clines M.D.   On: 06/20/2014 16:38   [place] Cm  06/20/2014   CLINICAL DATA:  Acute onset of lockjaw. Concern for head or cervical spine injury. Initial encounter.  EXAM: CT HEAD WITHOUT CONTRAST  CT  MAXILLOFACIAL WITHOUT CONTRAST  CT CERVICAL SPINE WITHOUT CONTRAST  TECHNIQUE: Multidetector CT imaging of the head, cervical spine, and maxillofacial structures were performed using the standard protocol without intravenous contrast. Multiplanar CT image reconstructions of the cervical spine and maxillofacial structures were also generated.  COMPARISON:  None.  FINDINGS: CT HEAD FINDINGS  There is no evidence of acute infarction, mass lesion, or intra- or extra-axial hemorrhage on CT.  Scattered periventricular and subcortical white matter change likely reflects small vessel ischemic microangiopathy. Mild cortical volume loss is noted, with prominence of the ventricles and sulci. Small chronic lacunar infarcts are seen at the basal ganglia bilaterally.  The brainstem and fourth ventricle are within normal limits. The cerebral hemispheres demonstrate grossly normal gray-white differentiation. No mass effect or midline shift is seen.  There is no evidence of fracture; visualized osseous structures are unremarkable in appearance. The orbits are within normal limits. The paranasal sinuses and mastoid air cells are well-aerated. Soft tissue swelling is noted overlying the left frontal calvarium.  CT MAXILLOFACIAL FINDINGS  There is no evidence of fracture or dislocation. The maxilla and mandible appear intact. The nasal bone is unremarkable in appearance. The visualized dentition demonstrates no acute abnormality.  The orbits are intact bilaterally. The visualized paranasal sinuses and mastoid air cells are well-aerated.  Dense calcification is noted at the  carotid bifurcations bilaterally. Soft tissue swelling is noted overlying the left frontal calvarium. The parapharyngeal fat planes are preserved. The nasopharynx, oropharynx and hypopharynx are unremarkable in appearance. The visualized portions of the valleculae and piriform sinuses are grossly unremarkable.  The parotid and submandibular glands are within normal limits. No cervical lymphadenopathy is seen.  CT CERVICAL SPINE FINDINGS  There is no evidence of fracture or subluxation. Vertebral bodies demonstrate normal height and alignment. Minimal chronic endplate irregularity is noted at the mid cervical spine, and a small posterior disc osteophyte complex is seen at C6-C7. Intervertebral disc spaces are preserved. Prevertebral soft tissues are within normal limits.  The thyroid gland is unremarkable in appearance. The visualized lung apices are clear. No significant soft tissue abnormalities are seen.  IMPRESSION: 1. No evidence of traumatic intracranial injury or fracture. 2. No evidence of fracture or dislocation with regard to the maxillofacial structures. 3. No evidence of fracture or subluxation along the cervical spine. 4. Soft tissue swelling noted overlying the left frontal calvarium. 5. Mild cortical volume loss and scattered small vessel ischemic microangiopathy. Small chronic lacunar infarcts at the basal ganglia bilaterally. 6. Dense calcification at the carotid bifurcations bilaterally. 7. Minimal degenerative change at the mid to lower cervical spine.   Electronically Signed   By: Garald Balding M.D.   On: 06/20/2014 22:07     EKG Interpretation None      MDM   Final diagnoses:  Fall    Pt is an 81yo female with hx of chronic jaw dislocations and TMJ pain, presenting to ED with second jaw dislocation this week. Pt reports waking with jaw dislocated. Discussed pt with Dr. Darl Householder who also examined pt and was able to reduce jaw via closed reduction. Pt was given IV valium 5mg  to assist  with the procedure. Conscious sedation was not necessary.    3:35 PM Attempted to contact pt's daughter, her [place] Organ [phone].  No answer, voicemail was left for daughter to call back to discuss treatment options and possible surgery consult.  Daughter arrived to ED, agrees with plan to tx pt in ED.  Agrees with f/u  with [facility] next week to discuss options for preventing dislocations.  Consulted with Dr. Theodoro Kos who states muscle relaxers and braces can be used to help prevent additional dislocations, however, states TMJ chronic dislocations are difficult to treat.    Pt's daughter agrees with f/u with [facility] next week to discuss options for preventing dislocations.  Pt monitored in ED as O2 sats dropped to 89% on RA after given valium. Pt placed on non-rebreather.  O2 improved to 99%, will discontinue O2 and continue to monitor in ED. Pt given water and apple sauce to consume in ED.    6:47 PM Advised by RN, pt has been able to talk as well as keep down PO fluids and apple sauce, however, O2 sat dropped to 80s.  Pt was placed back on 2L O2.  Will discuss pt with Dr. Darl Householder to help determine pt's disposition.  Pt will likely need to be admitted for hypoxia.  CXR: lungs clear, heart size within normal limits.    7:24 PM Discussed pt with Dr. Darl Householder, will tx for asthma exacerbation as no evidence of pneumonia on CXR.  Solumedrol given. Will admit for hypoxia.    7:49 PM Consulted with Dr. Hal Hope, recommended using respiratory tx of spirometry and to ambulate pt.  If pt still hypoxic, pt will be admitted.   7:55 PM Went to discuss tx plan with RN, was advised that pt attempted to get out of bed on her own, pt was found on the ground at the end of her bed.  Pt now has a new left sided facial apression and contusion.  No vomiting.  Fall was unwitnessed.  Pt states she was trying to find her daughter. advised pt her daughter had to go home.  Pt placed back in bed,  Sitter placed in room to watch pt to ensure she does not attempt to get out of bed again. Charge Nurse created incident report.    CT head, maxillofacial, and cervical spine: negative for acute injuries.   10:15 PM Pt continues to drop to 89% on RA.  Pt needs to be admitted for hypoxia.   10:56 PM Consulted with Dr. Roel Cluck, recommended getting a CT angio chest to r/o PE as pt had recent admission and cause of hypoxia still unknown given pt does not have any evidence of pneumonia, and doubtful hypoxia is from valium at this time.   CT angio chest: no acute pulmonary embolism on respiratory motion degraded exam.  Bronchial wall thickening may reflect bronchitis with probable mucous impaction in lower lobes w/o focal consolidation.   Dr. Roel Cluck will admit to a tele bed.     Noland Fordyce, PA-C 06/21/14 0029  Noland Fordyce, PA-C 06/21/14 0031  Wandra Arthurs, MD 06/21/14 1452

## 2014-06-21 DIAGNOSIS — Z7982 Long term (current) use of aspirin: Secondary | ICD-10-CM | POA: Diagnosis not present

## 2014-06-21 DIAGNOSIS — Z79891 Long term (current) use of opiate analgesic: Secondary | ICD-10-CM | POA: Diagnosis not present

## 2014-06-21 DIAGNOSIS — E43 Unspecified severe protein-calorie malnutrition: Secondary | ICD-10-CM | POA: Diagnosis not present

## 2014-06-21 DIAGNOSIS — E876 Hypokalemia: Secondary | ICD-10-CM | POA: Diagnosis not present

## 2014-06-21 DIAGNOSIS — I10 Essential (primary) hypertension: Secondary | ICD-10-CM | POA: Diagnosis not present

## 2014-06-21 DIAGNOSIS — Z7952 Long term (current) use of systemic steroids: Secondary | ICD-10-CM | POA: Diagnosis not present

## 2014-06-21 DIAGNOSIS — S030XXA Dislocation of jaw, initial encounter: Secondary | ICD-10-CM | POA: Diagnosis not present

## 2014-06-21 DIAGNOSIS — Z880 Allergy status to penicillin: Secondary | ICD-10-CM | POA: Diagnosis not present

## 2014-06-21 DIAGNOSIS — W06XXXA Fall from bed, initial encounter: Secondary | ICD-10-CM | POA: Diagnosis not present

## 2014-06-21 DIAGNOSIS — Z833 Family history of diabetes mellitus: Secondary | ICD-10-CM | POA: Diagnosis not present

## 2014-06-21 DIAGNOSIS — J40 Bronchitis, not specified as acute or chronic: Secondary | ICD-10-CM | POA: Diagnosis present

## 2014-06-21 DIAGNOSIS — F418 Other specified anxiety disorders: Secondary | ICD-10-CM | POA: Diagnosis present

## 2014-06-21 DIAGNOSIS — T17990A Other foreign object in respiratory tract, part unspecified in causing asphyxiation, initial encounter: Secondary | ICD-10-CM | POA: Diagnosis present

## 2014-06-21 DIAGNOSIS — J9601 Acute respiratory failure with hypoxia: Secondary | ICD-10-CM | POA: Diagnosis not present

## 2014-06-21 DIAGNOSIS — I480 Paroxysmal atrial fibrillation: Secondary | ICD-10-CM | POA: Diagnosis not present

## 2014-06-21 DIAGNOSIS — Z681 Body mass index (BMI) 19 or less, adult: Secondary | ICD-10-CM | POA: Diagnosis not present

## 2014-06-21 DIAGNOSIS — R6884 Jaw pain: Secondary | ICD-10-CM | POA: Diagnosis present

## 2014-06-21 DIAGNOSIS — M810 Age-related osteoporosis without current pathological fracture: Secondary | ICD-10-CM | POA: Diagnosis present

## 2014-06-21 DIAGNOSIS — S030XXD Dislocation of jaw, subsequent encounter: Secondary | ICD-10-CM | POA: Diagnosis not present

## 2014-06-21 DIAGNOSIS — Z79899 Other long term (current) drug therapy: Secondary | ICD-10-CM | POA: Diagnosis not present

## 2014-06-21 DIAGNOSIS — E785 Hyperlipidemia, unspecified: Secondary | ICD-10-CM | POA: Diagnosis present

## 2014-06-21 DIAGNOSIS — X58XXXA Exposure to other specified factors, initial encounter: Secondary | ICD-10-CM | POA: Diagnosis present

## 2014-06-21 DIAGNOSIS — I129 Hypertensive chronic kidney disease with stage 1 through stage 4 chronic kidney disease, or unspecified chronic kidney disease: Secondary | ICD-10-CM | POA: Diagnosis present

## 2014-06-21 DIAGNOSIS — J209 Acute bronchitis, unspecified: Secondary | ICD-10-CM | POA: Diagnosis present

## 2014-06-21 DIAGNOSIS — Y92238 Other place in hospital as the place of occurrence of the external cause: Secondary | ICD-10-CM | POA: Diagnosis not present

## 2014-06-21 DIAGNOSIS — N189 Chronic kidney disease, unspecified: Secondary | ICD-10-CM | POA: Diagnosis present

## 2014-06-21 DIAGNOSIS — R0902 Hypoxemia: Secondary | ICD-10-CM | POA: Diagnosis present

## 2014-06-21 DIAGNOSIS — J181 Lobar pneumonia, unspecified organism: Secondary | ICD-10-CM | POA: Diagnosis not present

## 2014-06-21 DIAGNOSIS — E039 Hypothyroidism, unspecified: Secondary | ICD-10-CM | POA: Diagnosis present

## 2014-06-21 DIAGNOSIS — Z8673 Personal history of transient ischemic attack (TIA), and cerebral infarction without residual deficits: Secondary | ICD-10-CM | POA: Diagnosis not present

## 2014-06-21 DIAGNOSIS — Z993 Dependence on wheelchair: Secondary | ICD-10-CM | POA: Diagnosis not present

## 2014-06-21 DIAGNOSIS — Z66 Do not resuscitate: Secondary | ICD-10-CM | POA: Diagnosis present

## 2014-06-21 DIAGNOSIS — M2669 Other specified disorders of temporomandibular joint: Secondary | ICD-10-CM | POA: Diagnosis present

## 2014-06-21 DIAGNOSIS — Z8249 Family history of ischemic heart disease and other diseases of the circulatory system: Secondary | ICD-10-CM | POA: Diagnosis not present

## 2014-06-21 DIAGNOSIS — Z950 Presence of cardiac pacemaker: Secondary | ICD-10-CM | POA: Diagnosis not present

## 2014-06-21 DIAGNOSIS — I482 Chronic atrial fibrillation: Secondary | ICD-10-CM | POA: Diagnosis present

## 2014-06-21 DIAGNOSIS — Z9049 Acquired absence of other specified parts of digestive tract: Secondary | ICD-10-CM | POA: Diagnosis present

## 2014-06-21 DIAGNOSIS — J45901 Unspecified asthma with (acute) exacerbation: Secondary | ICD-10-CM | POA: Diagnosis present

## 2014-06-21 DIAGNOSIS — R296 Repeated falls: Secondary | ICD-10-CM | POA: Diagnosis present

## 2014-06-21 LAB — COMPREHENSIVE METABOLIC PANEL
ALT: 30 U/L (ref 0–35)
AST: 20 U/L (ref 0–37)
Albumin: 2.9 g/dL — ABNORMAL LOW (ref 3.5–5.2)
Alkaline Phosphatase: 62 U/L (ref 39–117)
Anion gap: 10 (ref 5–15)
BUN: 34 mg/dL — ABNORMAL HIGH (ref 6–23)
CALCIUM: 8.9 mg/dL (ref 8.4–10.5)
CO2: 29 mmol/L (ref 19–32)
CREATININE: 0.76 mg/dL (ref 0.50–1.10)
Chloride: 98 mmol/L (ref 96–112)
GFR, EST AFRICAN AMERICAN: 89 mL/min — AB (ref >90)
GFR, EST NON AFRICAN AMERICAN: 76 mL/min — AB (ref >90)
GLUCOSE: 219 mg/dL — AB (ref 70–99)
Potassium: 3.7 mmol/L (ref 3.5–5.1)
Sodium: 137 mmol/L (ref 135–145)
Total Bilirubin: 0.4 mg/dL (ref 0.3–1.2)
Total Protein: 6.2 g/dL (ref 6.0–8.3)

## 2014-06-21 LAB — CBC WITH DIFFERENTIAL/PLATELET
Basophils Absolute: 0 10*3/uL (ref 0.0–0.1)
Basophils Relative: 0 % (ref 0–1)
Eosinophils Absolute: 0.1 10*3/uL (ref 0.0–0.7)
Eosinophils Relative: 1 % (ref 0–5)
HCT: 45.2 % (ref 36.0–46.0)
Hemoglobin: 14.4 g/dL (ref 12.0–15.0)
LYMPHS ABS: 0.9 10*3/uL (ref 0.7–4.0)
LYMPHS PCT: 9 % — AB (ref 12–46)
MCH: 28.1 pg (ref 26.0–34.0)
MCHC: 31.9 g/dL (ref 30.0–36.0)
MCV: 88.1 fL (ref 78.0–100.0)
Monocytes Absolute: 0.1 10*3/uL (ref 0.1–1.0)
Monocytes Relative: 1 % — ABNORMAL LOW (ref 3–12)
NEUTROS ABS: 8.1 10*3/uL — AB (ref 1.7–7.7)
Neutrophils Relative %: 89 % — ABNORMAL HIGH (ref 43–77)
PLATELETS: 327 10*3/uL (ref 150–400)
RBC: 5.13 MIL/uL — AB (ref 3.87–5.11)
RDW: 13.8 % (ref 11.5–15.5)
WBC: 9.2 10*3/uL (ref 4.0–10.5)

## 2014-06-21 MED ORDER — MOMETASONE FURO-FORMOTEROL FUM 100-5 MCG/ACT IN AERO
2.0000 | INHALATION_SPRAY | Freq: Two times a day (BID) | RESPIRATORY_TRACT | Status: DC
  Administered 2014-06-21: 2 via RESPIRATORY_TRACT
  Filled 2014-06-21: qty 8.8

## 2014-06-21 MED ORDER — HEPARIN SODIUM (PORCINE) 5000 UNIT/ML IJ SOLN
5000.0000 [IU] | Freq: Three times a day (TID) | INTRAMUSCULAR | Status: DC
Start: 2014-06-21 — End: 2014-06-25
  Administered 2014-06-21 – 2014-06-25 (×14): 5000 [IU] via SUBCUTANEOUS
  Filled 2014-06-21 (×14): qty 1

## 2014-06-21 MED ORDER — HYDROCODONE-ACETAMINOPHEN 5-325 MG PO TABS
1.0000 | ORAL_TABLET | Freq: Four times a day (QID) | ORAL | Status: DC | PRN
  Administered 2014-06-21: 1 via ORAL
  Filled 2014-06-21: qty 1

## 2014-06-21 MED ORDER — POTASSIUM CHLORIDE CRYS ER 20 MEQ PO TBCR
20.0000 meq | EXTENDED_RELEASE_TABLET | Freq: Every day | ORAL | Status: DC
  Administered 2014-06-21 – 2014-06-24 (×3): 20 meq via ORAL
  Filled 2014-06-21 (×4): qty 1

## 2014-06-21 MED ORDER — GUAIFENESIN ER 600 MG PO TB12
600.0000 mg | ORAL_TABLET | Freq: Two times a day (BID) | ORAL | Status: DC
  Administered 2014-06-21 – 2014-06-22 (×5): 600 mg via ORAL
  Filled 2014-06-21 (×6): qty 1

## 2014-06-21 MED ORDER — LOSARTAN POTASSIUM 50 MG PO TABS
100.0000 mg | ORAL_TABLET | Freq: Every day | ORAL | Status: DC
  Administered 2014-06-21 – 2014-06-22 (×2): 100 mg via ORAL
  Filled 2014-06-21 (×3): qty 2

## 2014-06-21 MED ORDER — LORAZEPAM 0.5 MG PO TABS
0.5000 mg | ORAL_TABLET | Freq: Three times a day (TID) | ORAL | Status: DC | PRN
  Administered 2014-06-22 – 2014-06-23 (×2): 0.5 mg via ORAL
  Filled 2014-06-21 (×2): qty 1

## 2014-06-21 MED ORDER — DEXTROMETHORPHAN POLISTIREX ER 30 MG/5ML PO SUER
15.0000 mg | Freq: Every evening | ORAL | Status: DC | PRN
  Filled 2014-06-21 (×2): qty 5

## 2014-06-21 MED ORDER — DEXTROSE 5 % IV SOLN
500.0000 mg | INTRAVENOUS | Status: DC
  Administered 2014-06-21 – 2014-06-24 (×4): 500 mg via INTRAVENOUS
  Filled 2014-06-21 (×4): qty 500

## 2014-06-21 MED ORDER — ACETAMINOPHEN 325 MG PO TABS
650.0000 mg | ORAL_TABLET | Freq: Four times a day (QID) | ORAL | Status: DC | PRN
  Administered 2014-06-22 – 2014-06-25 (×2): 650 mg via ORAL
  Filled 2014-06-21 (×3): qty 2

## 2014-06-21 MED ORDER — MONTELUKAST SODIUM 10 MG PO TABS
10.0000 mg | ORAL_TABLET | Freq: Every day | ORAL | Status: DC
  Administered 2014-06-21 – 2014-06-22 (×3): 10 mg via ORAL
  Filled 2014-06-21 (×3): qty 1

## 2014-06-21 MED ORDER — CEFTRIAXONE SODIUM IN DEXTROSE 20 MG/ML IV SOLN
1.0000 g | Freq: Every day | INTRAVENOUS | Status: DC
  Administered 2014-06-21 – 2014-06-24 (×5): 1 g via INTRAVENOUS
  Filled 2014-06-21 (×6): qty 50

## 2014-06-21 MED ORDER — ISOSORBIDE MONONITRATE ER 30 MG PO TB24
30.0000 mg | ORAL_TABLET | ORAL | Status: DC
  Administered 2014-06-21: 30 mg via ORAL

## 2014-06-21 MED ORDER — PREDNISONE 20 MG PO TABS
60.0000 mg | ORAL_TABLET | Freq: Every day | ORAL | Status: DC
  Administered 2014-06-21 – 2014-06-22 (×2): 60 mg via ORAL
  Filled 2014-06-21 (×2): qty 3

## 2014-06-21 MED ORDER — ISOSORBIDE MONONITRATE ER 30 MG PO TB24
30.0000 mg | ORAL_TABLET | Freq: Every day | ORAL | Status: DC
  Administered 2014-06-22: 30 mg via ORAL
  Filled 2014-06-21 (×2): qty 1

## 2014-06-21 MED ORDER — IPRATROPIUM BROMIDE 0.02 % IN SOLN
0.5000 mg | Freq: Four times a day (QID) | RESPIRATORY_TRACT | Status: DC
  Administered 2014-06-21 (×2): 0.5 mg via RESPIRATORY_TRACT
  Filled 2014-06-21 (×2): qty 2.5

## 2014-06-21 MED ORDER — CETYLPYRIDINIUM CHLORIDE 0.05 % MT LIQD
7.0000 mL | Freq: Two times a day (BID) | OROMUCOSAL | Status: DC
  Administered 2014-06-21 – 2014-06-24 (×6): 7 mL via OROMUCOSAL

## 2014-06-21 MED ORDER — ALBUTEROL SULFATE (2.5 MG/3ML) 0.083% IN NEBU: 2.5000 mg | INHALATION_SOLUTION | RESPIRATORY_TRACT | Status: DC | PRN

## 2014-06-21 MED ORDER — BISOPROLOL FUMARATE 5 MG PO TABS
5.0000 mg | ORAL_TABLET | Freq: Every day | ORAL | Status: DC
  Administered 2014-06-21 – 2014-06-24 (×3): 5 mg via ORAL
  Filled 2014-06-21 (×4): qty 1

## 2014-06-21 MED ORDER — IPRATROPIUM-ALBUTEROL 0.5-2.5 (3) MG/3ML IN SOLN
3.0000 mL | RESPIRATORY_TRACT | Status: DC | PRN
  Administered 2014-06-25: 3 mL via RESPIRATORY_TRACT
  Filled 2014-06-21: qty 3

## 2014-06-21 MED ORDER — SIMVASTATIN 40 MG PO TABS
40.0000 mg | ORAL_TABLET | Freq: Every day | ORAL | Status: DC
  Administered 2014-06-21 – 2014-06-22 (×2): 40 mg via ORAL
  Filled 2014-06-21 (×2): qty 1

## 2014-06-21 MED ORDER — CHLORHEXIDINE GLUCONATE 0.12 % MT SOLN
15.0000 mL | Freq: Two times a day (BID) | OROMUCOSAL | Status: DC
  Administered 2014-06-21 – 2014-06-24 (×6): 15 mL via OROMUCOSAL
  Filled 2014-06-21 (×6): qty 15

## 2014-06-21 MED ORDER — SODIUM CHLORIDE 0.9 % IV SOLN
INTRAVENOUS | Status: AC
  Administered 2014-06-21: 02:00:00 via INTRAVENOUS

## 2014-06-21 MED ORDER — ASPIRIN 81 MG PO CHEW
81.0000 mg | CHEWABLE_TABLET | Freq: Every day | ORAL | Status: DC
  Administered 2014-06-21 – 2014-06-22 (×2): 81 mg via ORAL
  Filled 2014-06-21 (×3): qty 1

## 2014-06-21 NOTE — Progress Notes (Signed)
Patient ID: Yvonne Buck, female   DOB: Apr 06, 1929, 81 y.o.   MRN: [id number] [facility] PROGRESS NOTE  Yvonne Buck TKP:[id number] DOB: [birth year] DOA: 06/20/2014 PCP: Blanchie Serve, MD  Brief narrative:    81 y.o. female with past medical history of tachy-brady syndrome, atrial fib (not on AC due to recurrent falls), has pacemaker, dyslipidemia, hypertension, from SNF, just recently hospitalized (discharge 06/19/2014 for asthma exacerbation)  who presented to [facility] ED with initial concern for jaw dislocation but while in ED she was hypoxic and has required 2 L [place] oxygen support to keep O2 sat above 90%.  CT angio chest ruled out PE but did shows bronchitic changes. CXR did not reveal acute cardiopulmonary findings. She was started on empiric abx for possible pneumonia. While in ED, she sustained fall and CT head obtained but did not show acute intracranial findings.  Barrier to discharge: on IV abx for pneumonia; needs PT eval for safe d/c plan  Assessment/Plan:     Principal Problem: Acute respiratory failure with hypoxia / Possible lobar pneumonia / Acute bronchitis / Leukocytosis  - Pt presented with findings of hypoxia but not in respiratory distress. PSI/PORT score 132 - Pt placed on azithromycin and rocephin for possible pneumonia - Blood cultures not obtained at hte time of the admission - Follow up legionella and strep pneumonia results, order placed but needs to be collected - Continue oxygen support via [place] to keep O2 sats above 90% - Leukocytosis resolved   Active Problems:   Atrial fibrillation, chronic - CHDS vasc score 5 - Not on anticoagulation due to repeated falls  - She is on aspirin daily  - Rate controlled with bisoprolol     Chronic asthma - Respiratory status stable - Use duoneb every 4 hours PRN shortness of breath or wheezing  - Continue daily prednisone 60 mg daily     Essential hypertension, benign - Continue bisoprolol, Imdur and losartan   Protein-calorie malnutrition, mild - Nutrition consulted - Recent SLP eval - recommended dysphagia 3 diet    Dyslipidemia - Continue Zocor 40 mg daily    History of CVA - Aspirin for secondary stroke prevention     Hypokalemia - Likely due to neb treatment - Supplemented - Potassium WNL    DVT Prophylaxis  - Heparin subQ ordered    Code Status: Full.  Family Communication:  plan of care discussed with the patient; family not at the bedside  Disposition Plan: to SNF likely in next 2-3 days.   IV access:  Peripheral IV  Procedures and diagnostic studies:    Dg Chest 2 View 06/19/2014  1. Diminished exam detail due to low lung volumes. 2. No pneumonia identified. There is a continued concern for infection then a repeat chest radiograph in the standing PA projection should be obtained.    Ct Head Wo Contrast 06/20/2014  1. No evidence of traumatic intracranial injury or fracture. 2. No evidence of fracture or dislocation with regard to the maxillofacial structures. 3. No evidence of fracture or subluxation along the cervical spine. 4. Soft tissue swelling noted overlying the left frontal calvarium. 5. Mild cortical volume loss and scattered small vessel ischemic microangiopathy. Small chronic lacunar infarcts at the basal ganglia bilaterally. 6. Dense calcification at the carotid bifurcations bilaterally. 7. Minimal degenerative change at the mid to lower cervical spine.   Electronically Signed   By: Garald Balding M.D.   On: 06/20/2014 22:07   Ct Angio Chest Pe W/cm &/or Wo Cm 06/21/2014  No acute pulmonary embolism on this respiratory motion degraded examination.  Bronchial wall thickening may reflect bronchitis, with probable mucous impaction in the lower lobes without focal consolidation.  \  Ct Cervical Spine Wo Contrast 06/20/2014   1. No evidence of traumatic intracranial injury or fracture. 2. No evidence of fracture or dislocation with regard to the maxillofacial structures. 3.  No evidence of fracture or subluxation along the cervical spine. 4. Soft tissue swelling noted overlying the left frontal calvarium. 5. Mild cortical volume loss and scattered small vessel ischemic microangiopathy. Small chronic lacunar infarcts at the basal ganglia bilaterally. 6. Dense calcification at the carotid bifurcations bilaterally. 7. Minimal degenerative change at the mid to lower cervical spine.   Electronically Signed   By: Garald Balding M.D.   On: 06/20/2014 22:07   Dg Chest Portable 1 View 06/20/2014 1. The lungs appear clear.  Heart size within normal limits. 2. Dual lead pacer in place.     Dg Swallowing Func-speech Pathology 06/18/2014     [place] Wo Cm 06/20/2014  1. No evidence of traumatic intracranial injury or fracture. 2. No evidence of fracture or dislocation with regard to the maxillofacial structures. 3. No evidence of fracture or subluxation along the cervical spine. 4. Soft tissue swelling noted overlying the left frontal calvarium. 5. Mild cortical volume loss and scattered small vessel ischemic microangiopathy. Small chronic lacunar infarcts at the basal ganglia bilaterally. 6. Dense calcification at the carotid bifurcations bilaterally. 7. Minimal degenerative change at the mid to lower cervical spine.    Medical Consultants:  None   Other Consultants:  Physical therapy Nutrition  IAnti-Infectives:   Azithromycin 06/20/2014 --> Rocephin 06/20/2014 -->    Yvonne Caris, MD  [facility] Pager [phone]  Time spent in minutes: 25 minutes  If 7PM-7AM, please contact night-coverage [email or web address] Password [email or web address] 06/21/2014, 10:38 AM   LOS: 0 days    HPI/Subjective: No acute overnight events. Patient reports no cough. No nausea or vomiting.   Objective: Filed Vitals:   06/21/14 0028 06/21/14 0139 06/21/14 0621 06/21/14 0847  BP: 124/75 141/72 130/61   Pulse: 56 72 60   Temp:  98.2 F (36.8 C) 98.5 F (36.9 C)   TempSrc:  Axillary  Axillary   Resp: 18 22 18    Height:  5\' 5"  (1.651 m)    Weight:  50 kg (110 lb 3.7 oz)    SpO2: 94% 97% 100% 98%    Intake/Output Summary (Last 24 hours) at 06/21/14 1038 Last data filed at 06/21/14 1036  Gross per 24 hour  Intake    935 ml  Output    900 ml  Net     35 ml    Exam:   General:  Pt is alert, not in acute distress, disoriented   Cardiovascular: RRR, Appreciate S1, S2   Respiratory: no wheezing, no crackles   Abdomen: Soft, non tender, non distended, bowel sounds present  Extremities: No edema, pulses DP and PT palpable bilaterally  Neuro: Grossly nonfocal  Data Reviewed: Basic Metabolic Panel:  Recent Labs Lab 06/16/14 1243 06/17/14 0414 06/19/14 0418 06/20/14 1633 06/21/14 0507  NA 136 138 139 139 137  K 3.2* 3.6 3.2* 3.3* 3.7  CL 99 100 99 101 98  CO2 28 29 29 31 29   GLUCOSE 127* 182* 119* 120* 219*  BUN 16 20 25* 33* 34*  CREATININE 0.58 0.61 0.60 0.60 0.76  CALCIUM 9.3 8.9 9.6 9.2 8.9   Liver Function Tests:  Recent Labs Lab 06/19/14 0418 06/21/14 0507  AST 19 20  ALT 21 30  ALKPHOS 68 62  BILITOT 0.5 0.4  PROT 7.1 6.2  ALBUMIN 3.6 2.9*   No results for input(s): LIPASE, AMYLASE in the last 168 hours. No results for input(s): AMMONIA in the last 168 hours. CBC:  Recent Labs Lab 06/16/14 1243 06/19/14 0418 06/20/14 1633 06/21/14 0507  WBC 7.1 12.7* 14.3* 9.2  NEUTROABS 4.5 9.7* 8.7* 8.1*  HGB 15.2* 15.0 16.0* 14.4  HCT 46.5* 45.5 50.3* 45.2  MCV 89.1 87.3 88.6 88.1  PLT 247 335 381 327   Cardiac Enzymes: No results for input(s): CKTOTAL, CKMB, CKMBINDEX, TROPONINI in the last 168 hours. BNP: Invalid input(s): POCBNP CBG:  Recent Labs Lab 06/18/14 1203 06/18/14 1716 06/18/14 2131 06/19/14 0754 06/19/14 1141  GLUCAP 132* 175* 218* 108* 97    Recent Results (from the past 240 hour(s))  MRSA PCR Screening     Status: None   Collection Time: 06/16/14  6:16 PM  Result Value Ref Range Status   MRSA by PCR  NEGATIVE NEGATIVE Final     Scheduled Meds: . aspirin  81 mg Oral Daily  . azithromycin  500 mg Intravenous Q24H  . bisoprolol  5 mg Oral Daily  . cefTRIAXone (ROCEPHIN)  IV  1 g Intravenous QHS  . guaiFENesin  600 mg Oral BID  . heparin  5,000 Units Subcutaneous 3 times per day  . isosorbide mononitrate  30 mg Oral BH-q7a  . losartan  100 mg Oral Daily  . montelukast  10 mg Oral QHS  . potassium chloride SA  20 mEq Oral Daily  . predniSONE  60 mg Oral Q breakfast  . simvastatin  40 mg Oral Daily   Continuous Infusions: . sodium chloride 75 mL/hr at 06/21/14 0144

## 2014-06-22 DIAGNOSIS — E43 Unspecified severe protein-calorie malnutrition: Secondary | ICD-10-CM | POA: Diagnosis present

## 2014-06-22 MED ORDER — PREDNISONE 50 MG PO TABS
50.0000 mg | ORAL_TABLET | Freq: Every day | ORAL | Status: DC
  Administered 2014-06-23: 50 mg via ORAL
  Filled 2014-06-22: qty 1

## 2014-06-22 MED ORDER — ENSURE ENLIVE PO LIQD
237.0000 mL | Freq: Two times a day (BID) | ORAL | Status: DC
  Administered 2014-06-22 – 2014-06-25 (×4): 237 mL via ORAL

## 2014-06-22 NOTE — Evaluation (Signed)
Physical Therapy Evaluation Patient Details Name: Yvonne Buck MRN: [id number] DOB: 05-16-1929 Today's Date: 06/22/2014   History of Present Illness  81 y/o female admitted with acute respiratory failure with hypoxia due to bronchitis and jaw pain.  Pt with PMH of CVA, pacemaker, L femur fx.  Clinical Impression  Pt admitted with above diagnosis. Pt currently with functional limitations due to the deficits listed below (see PT Problem List).  Pt will benefit from skilled PT to increase their independence and safety with mobility to allow discharge to the venue listed below.  Pt is a resident of [facility] and recommend returning after d/c from acute care.       Follow Up Recommendations SNF;Supervision/Assistance - 24 hour    Equipment Recommendations  None recommended by PT    Recommendations for Other Services       Precautions / Restrictions Precautions Precautions: Fall      Mobility  Bed Mobility   Bed Mobility: Supine to Sit     Supine to sit: Min assist Sit to supine: Mod assist   General bed mobility comments: Pt able to bring legs off of bed, but needs A with trunk.  Transfers Overall transfer level: Needs assistance   Transfers: Sit to/from Stand;Stand Pivot Transfers Sit to Stand: Max assist Stand pivot transfers: Total assist       General transfer comment: Pt offering no A with weight shifting with SPT.  LIttle to no weight thru L LE.   Ambulation/Gait             General Gait Details: NT  Stairs            Wheelchair Mobility    Modified Rankin (Stroke Patients Only)       Balance Overall balance assessment: History of Falls;Needs assistance           Standing balance-Leahy Scale: Zero                               Pertinent Vitals/Pain Pain Assessment: No/denies pain    Home Living Family/patient expects to be discharged to:: Skilled nursing facility                 Additional Comments: Pt  resides at [facility]    Prior Function Level of Independence: Needs assistance   Gait / Transfers Assistance Needed: Pt states she walks with a walker but has to have 2 people with her.           Hand Dominance        Extremity/Trunk Assessment   Upper Extremity Assessment: Generalized weakness           Lower Extremity Assessment: Generalized weakness      Cervical / Trunk Assessment: Kyphotic  Communication   Communication: Prefers language other than English  Cognition Arousal/Alertness: Awake/alert Behavior During Therapy: WFL for tasks assessed/performed Overall Cognitive Status: No family/caregiver present to determine baseline cognitive functioning                      General Comments General comments (skin integrity, edema, etc.): Pt attempting to get OOB upon arrival with IV pulled out.    Exercises        Assessment/Plan    PT Assessment Patient needs continued PT services  PT Diagnosis Difficulty walking;Generalized weakness   PT Problem List Decreased strength;Decreased activity tolerance;Decreased balance;Decreased mobility;Decreased knowledge of use of DME  PT Treatment Interventions     PT Goals (Current goals can be found in the Care Plan section) Acute Rehab PT Goals Patient Stated Goal: none stated PT Goal Formulation: With patient Time For Goal Achievement: 07/06/14 Potential to Achieve Goals: Fair    Frequency Min 2X/week   Barriers to discharge        Co-evaluation               End of Session Equipment Utilized During Treatment: Gait belt Activity Tolerance: Patient tolerated treatment well Patient left: in chair;with call bell/phone within reach;with chair alarm set Nurse Communication: Mobility status;Other (comment) (IV pulled out)         Time: 5686-1683 PT Time Calculation (min) (ACUTE ONLY): 18 min   Charges:   PT Evaluation $Initial PT Evaluation Tier I: 1 Procedure     PT G Codes:         Jovan Schickling LUBECK 06/22/2014, 10:52 AM

## 2014-06-22 NOTE — Clinical Social Work Note (Signed)
Clinical Social Work Assessment  Patient Details  Name: Yvonne Buck MRN: [id number] Date of Birth: 08-12-1929  Date of referral:  06/22/14               Reason for consult:  Facility Placement                Permission sought to share information with:  Facility Art therapist granted to share information::  Yes, Verbal Permission Granted  Name::        Agency::     Relationship::     Contact Information:     Housing/Transportation Living arrangements for the past 2 months:  [place] of Information:  Adult Children Patient Interpreter Needed:  None Criminal Activity/Legal Involvement Pertinent to Current Situation/Hospitalization:    Significant Relationships:  Adult Children Lives with:  Facility Resident Do you feel safe going back to the place where you live?  Yes Need for family participation in patient care:  No (Coment)  Care giving concerns:  CSW received consult that patient was admitted from [facility].    Social Worker assessment / plan:  CSW confirmed with patient's daughter, Yvonne Buck that plan is for patient to return to [facility] at discharge.   Employment status:  Retired Nurse, adult PT Recommendations:  [place] / Referral to community resources:  [place]  Patient/Family's Response to care:  Patient's daughter seemed pleased with the care her mother has been receiving at [facility].   Patient/Family's Understanding of and Emotional Response to Diagnosis, Current Treatment, and Prognosis:  Patient's daughter did not seem concerned with patient's diagnosis (pneumonia/fall) but plan is for her to return to SNF at discharge.   Emotional Assessment Appearance:  Appears stated age Attitude/Demeanor/Rapport:    Affect (typically observed):  Calm, Quiet Orientation:  Oriented to Self, Oriented to Place, Oriented to  Time, Oriented to  Situation Alcohol / Substance use:    Psych involvement (Current and /or in the community):  No (Comment)  Discharge Needs  Concerns to be addressed:  Discharge Planning Concerns Readmission within the last 30 days:    Current discharge risk:  None Barriers to Discharge:  No Barriers Identified   Yvonne Brooking, LCSW 06/22/2014, 3:09 PM

## 2014-06-22 NOTE — Progress Notes (Signed)
Initial Nutrition Assessment  DOCUMENTATION CODES:  Severe malnutrition in context of acute illness/injury, Underweight  INTERVENTION:  Ensure Enlive (each supplement provides 350kcal and 20 grams of protein) BID  Downgrade diet to Dysphagia 3 per last SLP evaluation recommendations Encourage PO intake  NUTRITION DIAGNOSIS:  Malnutrition related to other (see comment) (TMJ) as evidenced by moderate depletion of body fat, severe depletion of muscle mass.  GOAL:  Patient will meet greater than or equal to 90% of their needs   MONITOR:  PO intake, Supplement acceptance, Labs, Weight trends, I & O's  REASON FOR ASSESSMENT:  Consult Assessment of nutrition requirement/status  ASSESSMENT: 81 y.o. female with past medical history of tachy-brady syndrome, atrial fib (not on AC due to recurrent falls), has pacemaker, dyslipidemia, hypertension, from SNF, just recently hospitalized (discharge 06/19/2014 for asthma exacerbation) who presented to [facility] ED with initial concern for jaw dislocation but while in ED she was hypoxic and has required 2 L [place] oxygen support.   Pt in room eating lunch. Pt had consumed ~75% of her lunch tray of macaroni and cheese, chicken, green beans and cake for dessert. Per SLP evaluation on 4/25, Dys 3 diet recommended. RD to adjust diet order to Dysphagia 3.  Pt non-verbal, did not answer any of RD's questions. Pt with noticeable depletion in clavicle, temporal and calf regions.  Pt would benefit from nutritional supplements, RD to order.  Labs reviewed: Elevated BUN  Height:  Ht Readings from Last 1 Encounters:  06/21/14 5\' 5"  (1.651 m)    Weight:  Wt Readings from Last 1 Encounters:  06/21/14 110 lb 3.7 oz (50 kg)    Ideal Body Weight:  56.8 kg  Wt Readings from Last 10 Encounters:  06/21/14 110 lb 3.7 oz (50 kg)  06/17/14 115 lb 9.6 oz (52.436 kg)  04/30/14 130 lb (58.968 kg)  03/24/14 123 lb (55.792 kg)  01/16/14 119 lb (53.978 kg)   12/16/13 113 lb 12.8 oz (51.619 kg)  10/14/13 105 lb 6.4 oz (47.809 kg)  08/27/13 105 lb 6.4 oz (47.809 kg)  07/23/13 104 lb 3.2 oz (47.265 kg)  07/02/13 104 lb 3.2 oz (47.265 kg)    BMI:  Body mass index is 18.34 kg/(m^2).  Estimated Nutritional Needs:  Kcal:  1400-1600  Protein:  65-75g  Fluid:  1.5L/day    Skin:  Reviewed, no issues  Diet Order:  Diet regular Room service appropriate?: Yes; Fluid consistency:: Thin  EDUCATION NEEDS:  No education needs identified at this time   Intake/Output Summary (Last 24 hours) at 06/22/14 1306 Last data filed at 06/22/14 0915  Gross per 24 hour  Intake    360 ml  Output   1000 ml  Net   -640 ml    Last BM:  4/26  Clayton Bibles, MS, RD, LDN Pager: [phone] After Hours Pager: [phone]

## 2014-06-22 NOTE — Progress Notes (Signed)
Physical Therapy Treatment Patient Details Name: Adela Esteban MRN: [id number] DOB: 11-26-1929 Today's Date: 06/22/2014    History of Present Illness 81 y/o female admitted with acute respiratory failure with hypoxia due to bronchitis and jaw pain.  Pt with PMH of CVA, pacemaker, L femur fx.    PT Comments    Pt observed sliding forward in recliner after only being OOB ~20 minutes.  Even with chair alarm, did not feel pt was safe to leave in chair any longer and returned pt to bed.  Follow Up Recommendations  SNF;Supervision/Assistance - 24 hour     Equipment Recommendations  None recommended by PT    Recommendations for Other Services       Precautions / Restrictions Precautions Precautions: Fall    Mobility  Bed Mobility Overal bed mobility: Needs Assistance Bed Mobility: Sit to Supine     Supine to sit: Min assist Sit to supine: Mod assist   General bed mobility comments: A for positioning  Transfers Overall transfer level: Needs assistance Equipment used: None Transfers: Stand Pivot Transfers Sit to Stand: Max assist Stand pivot transfers: Max assist       General transfer comment: Pt with no WB thru L LE, but able to maintain upright standing posture. Bear hugged and turned to bed.  Ambulation/Gait             General Gait Details: NT   Stairs            Wheelchair Mobility    Modified Rankin (Stroke Patients Only)       Balance Overall balance assessment: History of Falls;Needs assistance           Standing balance-Leahy Scale: Zero                      Cognition Arousal/Alertness: Awake/alert Behavior During Therapy: WFL for tasks assessed/performed Overall Cognitive Status: No family/caregiver present to determine baseline cognitive functioning                      Exercises      General Comments General comments (skin integrity, edema, etc.): Pt attempting to get OOB upon arrival with IV pulled out.      Pertinent Vitals/Pain Pain Assessment: No/denies pain    Home Living Family/patient expects to be discharged to:: Skilled nursing facility               Additional Comments: Pt resides at [facility]    Prior Function Level of Independence: Needs assistance  Gait / Transfers Assistance Needed: Pt states she walks with a walker but has to have 2 people with her.       PT Goals (current goals can now be found in the care plan section) Acute Rehab PT Goals Patient Stated Goal: none stated PT Goal Formulation: With patient Time For Goal Achievement: 07/06/14 Potential to Achieve Goals: Fair Progress towards PT goals: Not progressing toward goals - comment    Frequency  Min 2X/week    PT Plan Current plan remains appropriate    Co-evaluation             End of Session Equipment Utilized During Treatment: Gait belt Activity Tolerance: Patient tolerated treatment well Patient left: in bed;with bed alarm set;with call bell/phone within reach     Time: 1031-1041 PT Time Calculation (min) (ACUTE ONLY): 10 min  Charges:  $Therapeutic Activity: 8-22 mins  G Codes:      Micheal Murad LUBECK 06/22/2014, 11:07 AM

## 2014-06-22 NOTE — Progress Notes (Signed)
Patient ID: Yvonne Buck, female   DOB: 03-22-1929, 81 y.o.   MRN: [id number] [facility] PROGRESS NOTE  Nicolena Schurman RDE:[id number] DOB: 12-17-1929 DOA: 06/20/2014 PCP: Blanchie Serve, MD  Brief narrative:    81 y.o. female with past medical history of tachy-brady syndrome, atrial fib (not on AC due to recurrent falls), has pacemaker, dyslipidemia, hypertension, from SNF, just recently hospitalized (discharge 06/19/2014 for asthma exacerbation)  who presented to [facility] ED with initial concern for jaw dislocation but while in ED she was hypoxic and has required 2 L [place] oxygen support to keep O2 sat above 90%.   CT angio chest ruled out PE but did shows bronchitic changes. CXR did not reveal acute cardiopulmonary findings. She was started on empiric abx for possible pneumonia.  While in ED, she sustained fall and CT head obtained but did not show acute intracranial findings.  Barrier to discharge: IV abx for pneumonia, anticipate D/C in next 24 hours.  Assessment/Plan:    Principal Problem: Acute respiratory failure with hypoxia / Possible lobar pneumonia / Acute bronchitis / Leukocytosis  - PSI/PORT score 132 on admission. - Continue azithromycin and Rocephin which was started on admission - Blood cultures not obtained at hte time of the admission - Follow up legionella and strep pneumonia results, order placed but for some reason not yet collected. - Continue oxygen support via [place] to keep O2 sats above 90% - Leukocytosis resolved   Active Problems:   Atrial fibrillation, chronic - CHDS vasc score 5 - Not on anticoagulation due to repeated falls  - Continue spirin daily  - Continue rate controlled with bisoprolol     Chronic asthma - Taper prednisone, today 50 mg daily.    Essential hypertension, benign - Continue bisoprolol, Imdur and losartan    Protein-calorie malnutrition, mild - Recent SLP eval - recommended dysphagia 3 diet    Dyslipidemia - Continue Zocor 40 mg  daily    History of CVA - Continue aspirin for secondary stroke prevention     Hypokalemia - Likely due to neb treatment - Supplemented and now WNL   DVT Prophylaxis  - Heparin subQ ordered while pt in hospital    Code Status: Full.  Family Communication:  plan of care discussed with the patient; family not at the bedside  Disposition Plan: to SNF likely in next 24 hours   IV access:  Peripheral IV  Procedures and diagnostic studies:    Dg Chest 2 View 06/19/2014  1. Diminished exam detail due to low lung volumes. 2. No pneumonia identified. There is a continued concern for infection then a repeat chest radiograph in the standing PA projection should be obtained.    Ct Head Wo Contrast 06/20/2014  1. No evidence of traumatic intracranial injury or fracture. 2. No evidence of fracture or dislocation with regard to the maxillofacial structures. 3. No evidence of fracture or subluxation along the cervical spine. 4. Soft tissue swelling noted overlying the left frontal calvarium. 5. Mild cortical volume loss and scattered small vessel ischemic microangiopathy. Small chronic lacunar infarcts at the basal ganglia bilaterally. 6. Dense calcification at the carotid bifurcations bilaterally. 7. Minimal degenerative change at the mid to lower cervical spine.   Electronically Signed   By: Garald Balding M.D.   On: 06/20/2014 22:07   Ct Angio Chest Pe W/cm &/or Wo Cm 06/21/2014    No acute pulmonary embolism on this respiratory motion degraded examination.  Bronchial wall thickening may reflect bronchitis, with probable mucous impaction  in the lower lobes without focal consolidation.  \  Ct Cervical Spine Wo Contrast 06/20/2014   1. No evidence of traumatic intracranial injury or fracture. 2. No evidence of fracture or dislocation with regard to the maxillofacial structures. 3. No evidence of fracture or subluxation along the cervical spine. 4. Soft tissue swelling noted overlying the left frontal  calvarium. 5. Mild cortical volume loss and scattered small vessel ischemic microangiopathy. Small chronic lacunar infarcts at the basal ganglia bilaterally. 6. Dense calcification at the carotid bifurcations bilaterally. 7. Minimal degenerative change at the mid to lower cervical spine.   Electronically Signed   By: Garald Balding M.D.   On: 06/20/2014 22:07   Dg Chest Portable 1 View 06/20/2014 1. The lungs appear clear.  Heart size within normal limits. 2. Dual lead pacer in place.     Dg Swallowing Func-speech Pathology 06/18/2014  [place] Wo Cm 06/20/2014  1. No evidence of traumatic intracranial injury or fracture. 2. No evidence of fracture or dislocation with regard to the maxillofacial structures. 3. No evidence of fracture or subluxation along the cervical spine. 4. Soft tissue swelling noted overlying the left frontal calvarium. 5. Mild cortical volume loss and scattered small vessel ischemic microangiopathy. Small chronic lacunar infarcts at the basal ganglia bilaterally. 6. Dense calcification at the carotid bifurcations bilaterally. 7. Minimal degenerative change at the mid to lower cervical spine.    Medical Consultants:  None   Other Consultants:  Physical therapy Nutrition  IAnti-Infectives:   Azithromycin 06/20/2014 --> Rocephin 06/20/2014 -->    Elio Haden, MD  [facility] Pager [phone]  Time spent in minutes: 15 minutes  If 7PM-7AM, please contact night-coverage [email or web address] Password [email or web address] 06/22/2014, 5:24 PM   LOS: 1 day    HPI/Subjective: No acute overnight events. Patient reports no cough. No nausea or vomiting.   Objective: Filed Vitals:   06/21/14 2218 06/22/14 0127 06/22/14 0455 06/22/14 1542  BP: 160/67 175/99 149/54 132/64  Pulse: 66 82 59 76  Temp: 98.2 F (36.8 C) 97.7 F (36.5 C) 97.7 F (36.5 C) 97.6 F (36.4 C)  TempSrc: Axillary Oral Oral Oral  Resp: 18 20 20 20   Height:      Weight:      SpO2: 98% 97% 97% 97%     Intake/Output Summary (Last 24 hours) at 06/22/14 1724 Last data filed at 06/22/14 1542  Gross per 24 hour  Intake    600 ml  Output    800 ml  Net   -200 ml    Exam:   General:  Pt is awake, alert, no distress  Cardiovascular: Rhythm controlled, appreciate S1, S2  Respiratory: bilateral air entry, no wheezing   Abdomen: (+) BS, soft and non tender  Extremities: No leg swelling, pulses palpable  Neuro: No focal deficits   Data Reviewed: Basic Metabolic Panel:  Recent Labs Lab 06/16/14 1243 06/17/14 0414 06/19/14 0418 06/20/14 1633 06/21/14 0507  NA 136 138 139 139 137  K 3.2* 3.6 3.2* 3.3* 3.7  CL 99 100 99 101 98  CO2 28 29 29 31 29   GLUCOSE 127* 182* 119* 120* 219*  BUN 16 20 25* 33* 34*  CREATININE 0.58 0.61 0.60 0.60 0.76  CALCIUM 9.3 8.9 9.6 9.2 8.9   Liver Function Tests:  Recent Labs Lab 06/19/14 0418 06/21/14 0507  AST 19 20  ALT 21 30  ALKPHOS 68 62  BILITOT 0.5 0.4  PROT 7.1 6.2  ALBUMIN 3.6 2.9*  No results for input(s): LIPASE, AMYLASE in the last 168 hours. No results for input(s): AMMONIA in the last 168 hours. CBC:  Recent Labs Lab 06/16/14 1243 06/19/14 0418 06/20/14 1633 06/21/14 0507  WBC 7.1 12.7* 14.3* 9.2  NEUTROABS 4.5 9.7* 8.7* 8.1*  HGB 15.2* 15.0 16.0* 14.4  HCT 46.5* 45.5 50.3* 45.2  MCV 89.1 87.3 88.6 88.1  PLT 247 335 381 327   Cardiac Enzymes: No results for input(s): CKTOTAL, CKMB, CKMBINDEX, TROPONINI in the last 168 hours. BNP: Invalid input(s): POCBNP CBG:  Recent Labs Lab 06/18/14 1203 06/18/14 1716 06/18/14 2131 06/19/14 0754 06/19/14 1141  GLUCAP 132* 175* 218* 108* 97    Recent Results (from the past 240 hour(s))  MRSA PCR Screening     Status: None   Collection Time: 06/16/14  6:16 PM  Result Value Ref Range Status   MRSA by PCR NEGATIVE NEGATIVE Final     Scheduled Meds: . aspirin  81 mg Oral Daily  . azithromycin  500 mg Intravenous Q24H  . bisoprolol  5 mg Oral Daily  .  cefTRIAXone (ROCEPHIN)  IV  1 g Intravenous QHS  . guaiFENesin  600 mg Oral BID  . heparin  5,000 Units Subcutaneous 3 times per day  . isosorbide mononitrate  30 mg Oral BH-q7a  . losartan  100 mg Oral Daily  . montelukast  10 mg Oral QHS  . potassium chloride SA  20 mEq Oral Daily  . predniSONE  60 mg Oral Q breakfast  . simvastatin  40 mg Oral Daily

## 2014-06-23 DIAGNOSIS — S030XXA Dislocation of jaw, initial encounter: Secondary | ICD-10-CM

## 2014-06-23 DIAGNOSIS — E43 Unspecified severe protein-calorie malnutrition: Secondary | ICD-10-CM

## 2014-06-23 MED ORDER — LORAZEPAM 2 MG/ML IJ SOLN
2.0000 mg | Freq: Once | INTRAMUSCULAR | Status: AC
  Administered 2014-06-23: 2 mg via INTRAVENOUS
  Filled 2014-06-23: qty 1

## 2014-06-23 MED ORDER — MORPHINE SULFATE 2 MG/ML IJ SOLN
INTRAMUSCULAR | Status: AC
  Filled 2014-06-23: qty 1

## 2014-06-23 MED ORDER — HYDRALAZINE HCL 20 MG/ML IJ SOLN
10.0000 mg | Freq: Four times a day (QID) | INTRAMUSCULAR | Status: DC | PRN
  Administered 2014-06-23 – 2014-06-25 (×2): 10 mg via INTRAVENOUS
  Filled 2014-06-23 (×2): qty 1

## 2014-06-23 MED ORDER — MORPHINE SULFATE 2 MG/ML IJ SOLN
1.0000 mg | Freq: Once | INTRAMUSCULAR | Status: AC
  Administered 2014-06-23: 1 mg via INTRAVENOUS
  Filled 2014-06-23: qty 1

## 2014-06-23 MED ORDER — MORPHINE SULFATE 2 MG/ML IJ SOLN
1.0000 mg | Freq: Three times a day (TID) | INTRAMUSCULAR | Status: DC | PRN
  Administered 2014-06-24 – 2014-06-25 (×2): 1 mg via INTRAVENOUS
  Filled 2014-06-23 (×2): qty 1

## 2014-06-23 MED ORDER — AMLODIPINE BESYLATE 5 MG PO TABS: 5.0000 mg | ORAL_TABLET | Freq: Every day | ORAL | Status: DC

## 2014-06-23 MED ORDER — MORPHINE SULFATE 2 MG/ML IJ SOLN
2.0000 mg | INTRAMUSCULAR | Status: AC
  Administered 2014-06-23: 2 mg via INTRAVENOUS

## 2014-06-23 MED ORDER — METHYLPREDNISOLONE SODIUM SUCC 40 MG IJ SOLR
40.0000 mg | INTRAMUSCULAR | Status: DC
  Administered 2014-06-23: 40 mg via INTRAVENOUS
  Filled 2014-06-23: qty 1

## 2014-06-23 MED ORDER — DIAZEPAM 5 MG/ML IJ SOLN
2.5000 mg | Freq: Once | INTRAMUSCULAR | Status: AC
  Administered 2014-06-23: 2.5 mg via INTRAVENOUS
  Filled 2014-06-23: qty 2

## 2014-06-23 MED ORDER — LORAZEPAM 2 MG/ML IJ SOLN
0.5000 mg | Freq: Four times a day (QID) | INTRAMUSCULAR | Status: DC | PRN
  Administered 2014-06-24: 0.5 mg via INTRAVENOUS
  Filled 2014-06-23: qty 1

## 2014-06-23 MED ORDER — ISOSORBIDE MONONITRATE ER 30 MG PO TB24
60.0000 mg | ORAL_TABLET | Freq: Every day | ORAL | Status: DC
  Filled 2014-06-23: qty 2

## 2014-06-23 NOTE — Progress Notes (Signed)
[facility] PROGRESS NOTE  Yvonne Buck UKG:[id number] DOB: Aug 08, 1929 DOA: 06/20/2014 PCP: Blanchie Serve, MD   brief narrative 81 year old female resident of skilled nursing facility with history of tachybradycardia syndrome status post pacemaker, A. fib not anticoagulation due to recurrent falls, dyslipidemia, hypertension, was recently hospitalized for asthma exacerbation and locked jaw open return to the ED with concern for jaw dislocation which was repositioned in the ED but was found to be hypoxic and admitted for possible lobar pneumonia/acute bronchitis.   Assessment/Plan: Acute hypoxic respiratory failure likely secondary to lobar pneumonia versus acute bronchitis Continue empiric Rocephin and azithromycin. Continue O2 via nasal cannula. Follow urine strep pneumonia antigen and Legionella antigen. -Patient afebrile and leukocytosis resolved.  Active problems  Recurrent locked jaw No clear etiology. Patient had persistent log so this morning which was unable to be reduced at bedside. Was finally reduced by ED PA at bedside after patient provided with some sedation. Diet chain to clear liquid. No clear cause for recurrent symptoms. Maxillofacial CT on admission unremarkable. Will monitor for now.  Chronic A. fib Not on anticoagulant syndrome due to repeated falls despite high chads vasc score. Continue daily aspirin.  Essential hypertension Held oral blood pressure medication due to locked jaw and placed on when necessary IV hydralazine.  Chronic asthma She was to IV prednisone and continue when necessary nebs  Severe protein calorie malnutrition Recommended dysphagia level III diet by swallowing evaluation recently. Currently on clear liquid.   DVT prophylaxis: Subcutaneous Lovenox  Code Status: Full code Family Communication: None at bedside Disposition Plan: Return to skilled nursing facility possibly tomorrow if stable  overnight.   Consultants:  None  Procedures:  Maxillofacial CT / CT angiogram of the chest, CT head and cervical spine on admission   Antibiotics:  Rocephin and azithromycin since 4/29  HPI/Subjective: Patient seen and examined. Had persistent locked jaw unclear duration. Unable to reduce the TMJ despite multiple attempts at bedside. Finally during early afternoon the ED PA was able to reduce it at bedside under mild sedation with Ativan.  Objective: Filed Vitals:   06/23/14 1405  BP: 133/74  Pulse: 61  Temp: 97.5 F (36.4 C)  Resp: 16    Intake/Output Summary (Last 24 hours) at 06/23/14 1556 Last data filed at 06/23/14 1437  Gross per 24 hour  Intake    990 ml  Output      0 ml  Net    990 ml   Filed Weights   06/21/14 0139  Weight: 50 kg (110 lb 3.7 oz)    Exam:   General: Elderly thin built female in distress with locked jaw  HEENT: Poor dentition, trismus, dry mucosa  Cardiovascular: S1 and S2 irregularly regular, no murmurs rub or gallop  Respiratory: Coarse breath sounds bilaterally, no added sounds  Abdomen: Soft, nondistended, nontender, bowel sounds present  Musculoskeletal: Warm, no edema  CNS: Alert and oriented  Data Reviewed: Basic Metabolic Panel:  Recent Labs Lab 06/17/14 0414 06/19/14 0418 06/20/14 1633 06/21/14 0507  NA 138 139 139 137  K 3.6 3.2* 3.3* 3.7  CL 100 99 101 98  CO2 29 29 31 29   GLUCOSE 182* 119* 120* 219*  BUN 20 25* 33* 34*  CREATININE 0.61 0.60 0.60 0.76  CALCIUM 8.9 9.6 9.2 8.9   Liver Function Tests:  Recent Labs Lab 06/19/14 0418 06/21/14 0507  AST 19 20  ALT 21 30  ALKPHOS 68 62  BILITOT 0.5 0.4  PROT 7.1 6.2  ALBUMIN 3.6  2.9*   No results for input(s): LIPASE, AMYLASE in the last 168 hours. No results for input(s): AMMONIA in the last 168 hours. CBC:  Recent Labs Lab 06/19/14 0418 06/20/14 1633 06/21/14 0507  WBC 12.7* 14.3* 9.2  NEUTROABS 9.7* 8.7* 8.1*  HGB 15.0 16.0* 14.4  HCT  45.5 50.3* 45.2  MCV 87.3 88.6 88.1  PLT 335 381 327   Cardiac Enzymes: No results for input(s): CKTOTAL, CKMB, CKMBINDEX, TROPONINI in the last 168 hours. BNP (last 3 results)  Recent Labs  06/16/14 1448  BNP 125.6*    ProBNP (last 3 results) No results for input(s): PROBNP in the last 8760 hours.  CBG:  Recent Labs Lab 06/18/14 1203 06/18/14 1716 06/18/14 2131 06/19/14 0754 06/19/14 1141  GLUCAP 132* 175* 218* 108* 97    Recent Results (from the past 240 hour(s))  MRSA PCR Screening     Status: None   Collection Time: 06/16/14  6:16 PM  Result Value Ref Range Status   MRSA by PCR NEGATIVE NEGATIVE Final    Comment:        The GeneXpert MRSA Assay (FDA approved for NASAL specimens only), is one component of a comprehensive MRSA colonization surveillance program. It is not intended to diagnose MRSA infection nor to guide or monitor treatment for MRSA infections.      Studies: No results found.  Scheduled Meds: . antiseptic oral rinse  7 mL Mouth Rinse q12n4p  . azithromycin  500 mg Intravenous Q24H  . bisoprolol  5 mg Oral Daily  . cefTRIAXone (ROCEPHIN)  IV  1 g Intravenous QHS  . chlorhexidine  15 mL Mouth Rinse BID  . feeding supplement (ENSURE ENLIVE)  237 mL Oral BID BM  . heparin  5,000 Units Subcutaneous 3 times per day  . morphine      . potassium chloride SA  20 mEq Oral Daily   Continuous Infusions:     Time spent: 25 minutes    Yvonne Buck, [place]  [facility] Pager [phone]. If 7PM-7AM, please contact night-coverage at [email or web address], password [facility] 06/23/2014, 3:56 PM  LOS: 2 days

## 2014-06-24 DIAGNOSIS — J40 Bronchitis, not specified as acute or chronic: Secondary | ICD-10-CM

## 2014-06-24 DIAGNOSIS — S030XXD Dislocation of jaw, subsequent encounter: Secondary | ICD-10-CM

## 2014-06-24 LAB — PREALBUMIN: PREALBUMIN: 27 mg/dL (ref 17–34)

## 2014-06-24 MED ORDER — AZITHROMYCIN 500 MG PO TABS
500.0000 mg | ORAL_TABLET | Freq: Every day | ORAL | Status: DC
  Filled 2014-06-24 (×2): qty 1

## 2014-06-24 MED ORDER — PREDNISONE 20 MG PO TABS
40.0000 mg | ORAL_TABLET | Freq: Every day | ORAL | Status: DC
  Administered 2014-06-24: 40 mg via ORAL
  Filled 2014-06-24: qty 2

## 2014-06-24 NOTE — Progress Notes (Signed)
[facility] PROGRESS NOTE  Yvonne Buck BTD:[id number] DOB: Aug 28, 1929 DOA: 06/20/2014 PCP: Blanchie Serve, MD   brief narrative 81 year old female resident of skilled nursing facility with history of tachybradycardia syndrome status post pacemaker, A. fib not anticoagulation due to recurrent falls, dyslipidemia, hypertension, was recently hospitalized for asthma exacerbation and locked jaw open return to the ED with concern for jaw dislocation which was repositioned in the ED but was found to be hypoxic and admitted for possible lobar pneumonia/acute bronchitis.   Assessment/Plan: Acute hypoxic respiratory failure likely secondary to lobar pneumonia versus acute bronchitis Continue empiric Rocephin and azithromycin. Continue O2 via nasal cannula.  -Patient afebrile and leukocytosis resolved. -When necessary nebs and oral prednisone for wheezing.  Active problems  Recurrent locked jaw No clear etiology. Has persistent locked jaw on 5/2 which was finally reduced by ED PA after sedation at bedside.  No clear cause for recurrent symptoms. Maxillofacial CT on admission unremarkable. Advanced diet to dysphagia level 3 with thin liquid as recommended by Speech during recent evaluation. -Patient will need outpatient referral to maxillofacial surgery for evaluation of her recurrent locked jaw  Chronic A. fib Not on anticoagulant syndrome due to repeated falls despite high chads vasc score. Continue daily aspirin.  Essential hypertension Resume home oral blood pressure medications  Chronic asthma On oral prednisone and nebs for acute bronchitis  Severe protein calorie malnutrition Recommended dysphagia level III diet by swallowing evaluation recently. Obtain nutrition evaluation.   DVT prophylaxis: Subcutaneous Lovenox  Code Status: Full code Family Communication: None at bedside Disposition Plan: Return to skilled nursing facility tomorrow if wheezing improved and mental  status clearing.   Consultants:  None  Procedures:  Maxillofacial CT / CT angiogram of the chest, CT head and cervical spine on admission   Antibiotics:  Rocephin and azithromycin since 4/29  HPI/Subjective: Patient seen and examined. No further lock jaw. Appears more confused and wheezy on exam.  Objective: Filed Vitals:   06/24/14 1332  BP: 132/65  Pulse: 64  Temp: 98.1 F (36.7 C)  Resp: 18    Intake/Output Summary (Last 24 hours) at 06/24/14 1405 Last data filed at 06/24/14 1050  Gross per 24 hour  Intake   1150 ml  Output      0 ml  Net   1150 ml   Filed Weights   06/21/14 0139  Weight: 50 kg (110 lb 3.7 oz)    Exam:   General: Elderly thin built female in distress with locked jaw  HEENT: Poor dentition, trismus, dry mucosa  Cardiovascular: S1 and S2 irregularly regular, no murmurs rub or gallop  Respiratory: Coarse breath sounds bilaterally, no added sounds  Abdomen: Soft, nondistended, nontender, bowel sounds present  Musculoskeletal: Warm, no edema  CNS: Alert and oriented  Data Reviewed: Basic Metabolic Panel:  Recent Labs Lab 06/19/14 0418 06/20/14 1633 06/21/14 0507  NA 139 139 137  K 3.2* 3.3* 3.7  CL 99 101 98  CO2 29 31 29   GLUCOSE 119* 120* 219*  BUN 25* 33* 34*  CREATININE 0.60 0.60 0.76  CALCIUM 9.6 9.2 8.9   Liver Function Tests:  Recent Labs Lab 06/19/14 0418 06/21/14 0507  AST 19 20  ALT 21 30  ALKPHOS 68 62  BILITOT 0.5 0.4  PROT 7.1 6.2  ALBUMIN 3.6 2.9*   No results for input(s): LIPASE, AMYLASE in the last 168 hours. No results for input(s): AMMONIA in the last 168 hours. CBC:  Recent Labs Lab 06/19/14 0418 06/20/14 1633 06/21/14  0507  WBC 12.7* 14.3* 9.2  NEUTROABS 9.7* 8.7* 8.1*  HGB 15.0 16.0* 14.4  HCT 45.5 50.3* 45.2  MCV 87.3 88.6 88.1  PLT 335 381 327   Cardiac Enzymes: No results for input(s): CKTOTAL, CKMB, CKMBINDEX, TROPONINI in the last 168 hours. BNP (last 3  results)  Recent Labs  06/16/14 1448  BNP 125.6*    ProBNP (last 3 results) No results for input(s): PROBNP in the last 8760 hours.  CBG:  Recent Labs Lab 06/18/14 1203 06/18/14 1716 06/18/14 2131 06/19/14 0754 06/19/14 1141  GLUCAP 132* 175* 218* 108* 97    Recent Results (from the past 240 hour(s))  MRSA PCR Screening     Status: None   Collection Time: 06/16/14  6:16 PM  Result Value Ref Range Status   MRSA by PCR NEGATIVE NEGATIVE Final    Comment:        The GeneXpert MRSA Assay (FDA approved for NASAL specimens only), is one component of a comprehensive MRSA colonization surveillance program. It is not intended to diagnose MRSA infection nor to guide or monitor treatment for MRSA infections.      Studies: No results found.  Scheduled Meds: . antiseptic oral rinse  7 mL Mouth Rinse q12n4p  . [START ON 06/25/2014] azithromycin  500 mg Oral QAC breakfast  . bisoprolol  5 mg Oral Daily  . cefTRIAXone (ROCEPHIN)  IV  1 g Intravenous QHS  . chlorhexidine  15 mL Mouth Rinse BID  . feeding supplement (ENSURE ENLIVE)  237 mL Oral BID BM  . heparin  5,000 Units Subcutaneous 3 times per day  . potassium chloride SA  20 mEq Oral Daily  . predniSONE  40 mg Oral Daily   Continuous Infusions:     Time spent: 25 minutes    Yvonne Buck, [place]  [facility] Pager [phone]. If 7PM-7AM, please contact night-coverage at [email or web address], password [facility] 06/24/2014, 2:05 PM  LOS: 3 days

## 2014-06-24 NOTE — Progress Notes (Signed)
PHARMACIST - PHYSICIAN COMMUNICATION DR:   [facility] CONCERNING: Antibiotic IV to Oral Route Change Policy  RECOMMENDATION: This patient is receiving azithomycin  by the intravenous route.  Based on criteria approved by the Pharmacy and Therapeutics Committee, the antibiotic(s) is/are being converted to the equivalent oral dose form(s).   DESCRIPTION: These criteria include:  Patient being treated for a respiratory tract infection, urinary tract infection, cellulitis or clostridium difficile associated diarrhea if on metronidazole  The patient is not neutropenic and does not exhibit a GI malabsorption state  The patient is eating (either orally or via tube) and/or has been taking other orally administered medications for a least 24 hours  The patient is improving clinically and has a Tmax < 100.5  If you have questions about this conversion, please contact the Pharmacy Department  []   [phone] )  [facility] []   [phone] )  [facility] []   [phone] )  [facility] []   [phone] )  [facility] [x]   [phone] )  [place], PharmD, BCPS.   Pager: [phone] 06/24/2014 1:14 PM

## 2014-06-25 MED ORDER — LORAZEPAM 2 MG/ML IJ SOLN
2.0000 mg | Freq: Once | INTRAMUSCULAR | Status: AC
  Administered 2014-06-25: 2 mg via INTRAVENOUS
  Filled 2014-06-25: qty 1

## 2014-06-25 MED ORDER — ENSURE ENLIVE PO LIQD: 237.0000 mL | Freq: Two times a day (BID) | ORAL | Status: DC

## 2014-06-25 NOTE — Discharge Instructions (Signed)

## 2014-06-25 NOTE — Progress Notes (Signed)
Physical Therapy Treatment Patient Details Name: Yvonne Buck MRN: [id number] DOB: 03-17-1929 Today's Date: 06/25/2014    History of Present Illness 81 y/o female admitted with acute respiratory failure with hypoxia due to bronchitis and jaw pain.  Pt with PMH of CVA, pacemaker, L femur fx.    PT Comments    Pt required Mod assist +2 for bed mobility on today. Pt not following commands well and tends to keep repeating different words over and over. Recommend return to SNF.   Follow Up Recommendations  SNF;Supervision/Assistance - 24 hour     Equipment Recommendations  None recommended by PT    Recommendations for Other Services       Precautions / Restrictions Precautions Precautions: Fall Restrictions Weight Bearing Restrictions: No    Mobility  Bed Mobility Overal bed mobility: Needs Assistance Bed Mobility: Supine to Sit;Sit to Supine     Supine to sit: Mod assist;+2 for physical assistance;+2 for safety/equipment Sit to supine: Mod assist;+2 for physical assistance;+2 for safety/equipment   General bed mobility comments: Assist for bil LEs and trunk. Utilized bedpad for scooting, positioning. Pt not following commands well.   Transfers                 General transfer comment: NT-pt having difficulty maintaining sitting balance at EOB and not following commands well enough to safely attempt.   Ambulation/Gait                 Stairs            Wheelchair Mobility    Modified Rankin (Stroke Patients Only)       Balance   Sitting-balance support: Bilateral upper extremity supported;Feet supported Sitting balance-Leahy Scale: Poor Sitting balance - Comments: Pt leaning towards L side and propping on L forearm/elbow when external support is not provided. Pt unable to self-right.  Postural control: Left lateral lean                          Cognition Arousal/Alertness: Awake/alert Behavior During Therapy:  (pt kept repeating  words over and over.) Overall Cognitive Status: No family/caregiver present to determine baseline cognitive functioning                      Exercises General Exercises - Lower Extremity Long Arc Quad: AROM;Right;5 reps;Seated    General Comments        Pertinent Vitals/Pain Pain Assessment: No/denies pain    Home Living                      Prior Function            PT Goals (current goals can now be found in the care plan section) Progress towards PT goals: Not progressing toward goals - comment    Frequency  Min 2X/week    PT Plan Current plan remains appropriate    Co-evaluation             End of Session   Activity Tolerance: Patient tolerated treatment well Patient left: in bed;with call bell/phone within reach;with bed alarm set     Time: 1352-1400 PT Time Calculation (min) (ACUTE ONLY): 8 min  Charges:  $Therapeutic Activity: 8-22 mins                    G Codes:      Weston Anna, MPT Pager: [phone]

## 2014-06-25 NOTE — Progress Notes (Signed)
Pt going back to [place] room 804 Attempted to call report, left a message on nursing line A. Kellie Simmering RN

## 2014-06-25 NOTE — Progress Notes (Signed)
Nutrition Follow-up  DOCUMENTATION CODES:  Severe malnutrition in context of acute illness/injury, Underweight  INTERVENTION:  Ensure Enlive (each supplement provides 350kcal and 20 grams of protein)  NUTRITION DIAGNOSIS:  Malnutrition related to other (see comment) (TMJ) as evidenced by moderate depletion of body fat, severe depletion of muscle mass. -ongoing   GOAL:  Patient will meet greater than or equal to 90% of their needs -not consistently met  MONITOR:  PO intake, Supplement acceptance, Labs, Weight trends, I & O's  REASON FOR ASSESSMENT:  Consult Assessment of nutrition requirement/status  ASSESSMENT:  Pt seen for consult for assessment and follow-up. Pt with TMJ, bandage to head in place. Plan is for pt to d/c this afternoon and bandage to stay in place. RN reports pt has been on liquids due to current condition and inability to remove bandage or jaw pops back out of place. She states pt drinks 100% of Ensure and seems to really like them. Pt had 0% breakfast and 100% lunch yesterday, 0% breakfast and lunch 5/2, and 50% breakfast, 100% lunch and dinner 5/1. Variably meeting needs. Will continue current regimen. Labs and medications reviewed; CBGs: 97-218 mg/dL, BUN continues to trend up.  Height:  Ht Readings from Last 1 Encounters:  06/21/14 5' 5"  (1.651 m)    Weight:  Wt Readings from Last 1 Encounters:  06/21/14 110 lb 3.7 oz (50 kg)    Ideal Body Weight:  56.8 kg  Wt Readings from Last 10 Encounters:  06/21/14 110 lb 3.7 oz (50 kg)  06/17/14 115 lb 9.6 oz (52.436 kg)  04/30/14 130 lb (58.968 kg)  03/24/14 123 lb (55.792 kg)  01/16/14 119 lb (53.978 kg)  12/16/13 113 lb 12.8 oz (51.619 kg)  10/14/13 105 lb 6.4 oz (47.809 kg)  08/27/13 105 lb 6.4 oz (47.809 kg)  07/23/13 104 lb 3.2 oz (47.265 kg)  07/02/13 104 lb 3.2 oz (47.265 kg)    BMI:  Body mass index is 18.34 kg/(m^2).  Estimated Nutritional Needs:  Kcal:  1400-1600  Protein:   65-75g  Fluid:  1.5L/day  Skin:  Reviewed, no issues  Diet Order:  DIET DYS 3 Room service appropriate?: Yes; Fluid consistency:: Thin  EDUCATION NEEDS:  No education needs identified at this time   Intake/Output Summary (Last 24 hours) at 06/25/14 1117 Last data filed at 06/25/14 1029  Gross per 24 hour  Intake    570 ml  Output      0 ml  Net    570 ml    Last BM:  No documentation available    Jarome Matin, RD, LDN Inpatient Clinical Dietitian Pager # [phone] After hours/weekend pager # [phone]

## 2014-06-25 NOTE — Discharge Summary (Signed)
Physician Discharge Summary  Yvonne Buck OAC:[id number] DOB: 1929-06-06 DOA: 06/20/2014  PCP: Blanchie Serve, MD  Admit date: 06/20/2014 Discharge date: 06/25/2014  Time spent: 35 minutes  Recommendations for Outpatient Follow-up:  1. D/c back to SNF ([facility]) 2. Nursing home M.D. to discuss with patient's daughter regarding further goals of care and outpatient referral to oral surgery. Recommend palliative care evaluation and follow-up at the facility.  Discharge Diagnoses:  Acute bronchitis with hypoxic respiratory failure  Active Problems:   TMJ (dislocation of temporomandibular joint)   Atrial fibrillation   Chronic asthma   Essential hypertension, benign   Hypokalemia   Protein-calorie malnutrition, severe   Discharge Condition: poor  Diet recommendation: Clear liquid with supplements.  Filed Weights   06/21/14 0139  Weight: 50 kg (110 lb 3.7 oz)    History of present illness:  Please refer to admission H&P for details, in brief, 81 year old female resident of skilled nursing facility with history of tachybradycardia syndrome status post pacemaker, A. fib not anticoagulation due to recurrent falls, dyslipidemia, hypertension, was recently hospitalized for asthma exacerbation and locked jaw open return to the ED with concern for jaw dislocation which was repositioned in the ED but was found to be hypoxic and admitted for possible ?lobar pneumonia/acute bronchitis.  Hospital Course:  Acute hypoxic respiratory failure likely secondary to  acute bronchitis Treated with empiric Rocephin and azithromycin. Patient afebrile and leukocytosis resolved. Symptoms improved with nebulizer and steroids.  Active problems  Recurrent locked jaw chronic TMJ dislocation No clear etiology. This seems to be ongoing and has had multiple ED visits in the past . Previous maxillofacial CT scans showing chronic TMJ dislocation. Has had multiple episodes of TMJ dislocation in the past 2  weeks. Last episode this morning which was reduced by ED physician. Have placed a roll around her head and chin to close her mouth while not eating. -I have discussed with the nursing home M.D. about her hospital course and she will discuss with patient's daughter regarding further goals of care and outpatient referral to oral surgeon. I have also recommended palliative care follow-up at the facility.  Chronic A. fib Not on anticoagulant syndrome due to repeated falls despite high chads vasc score. Continue daily aspirin.  Essential hypertension Resume home oral blood pressure medications  Chronic asthma Treated with nebulizer and prednisone for acute bronchitis. Continue  Severe protein calorie malnutrition Recommended dysphagia level III diet by swallowing evaluation recently. Given recurrent TMJ dislocation have changed diet to clear liquid and nutrition supplements going.   I have discontinued all narcotics and Ativan since patient has been more confused .  Code Status: DO NOT RESUSCITATE Family Communication: Spoke with daughter on 5/3 Disposition Plan: Return to skilled nursing facility    Consultants:  None  Procedures:  Maxillofacial CT / CT angiogram of the chest, CT head and cervical spine on admission   Antibiotics:  Rocephin and azithromycin completed on 5/4    Discharge Exam: Filed Vitals:   06/25/14 0613  BP: 127/72  Pulse: 63  Temp:   Resp:      General: Elderly thin built female in distress   HEENT: Poor dentition, trismus, dry mucosa  Cardiovascular: S1 and S2 irregularly regular, no murmurs rub or gallop  Respiratory: Breath sounds bilaterally, no added sounds  Abdomen: Soft, nondistended, nontender, bowel sounds present  Musculoskeletal: Warm, no edema  CNS: alert and oriented x 1-2, confused.  Discharge Instructions    Current Discharge Medication List    START  taking these medications   Details  feeding supplement, ENSURE  ENLIVE, (ENSURE ENLIVE) LIQD Take 237 mLs by mouth 2 (two) times daily between meals. Qty: 237 mL, Refills: 12      CONTINUE these medications which have NOT CHANGED   Details  albuterol (PROVENTIL HFA;VENTOLIN HFA) 108 (90 BASE) MCG/ACT inhaler Inhale 2 puffs into the lungs every 6 (six) hours as needed for wheezing or shortness of breath. Qty: 1 Inhaler, Refills: 2    amLODipine (NORVASC) 10 MG tablet Take 10 mg by mouth at bedtime.    aspirin 81 MG chewable tablet Chew 81 mg by mouth daily.    Fluticasone-Salmeterol (ADVAIR) 250-50 MCG/DOSE AEPB Inhale 1 puff into the lungs 2 (two) times daily.    hydrochlorothiazide (HYDRODIURIL) 25 MG tablet Take 1 tablet (25 mg total) by mouth daily. Qty: 90 tablet, Refills: 3   Associated Diagnoses: Essential hypertension, benign    isosorbide mononitrate (IMDUR) 30 MG 24 hr tablet Take 30 mg by mouth every morning.    losartan (COZAAR) 100 MG tablet Take 100 mg by mouth daily.    metoprolol (LOPRESSOR) 50 MG tablet Take 50 mg by mouth 2 (two) times daily.    montelukast (SINGULAIR) 10 MG tablet Take 10 mg by mouth at bedtime.    potassium chloride SA (K-DUR,KLOR-CON) 20 MEQ tablet Take 20 mEq by mouth daily.    simvastatin (ZOCOR) 40 MG tablet Take 40 mg by mouth daily.      STOP taking these medications     HYDROcodone-acetaminophen (NORCO/VICODIN) 5-325 MG per tablet      LORazepam (ATIVAN) 0.5 MG tablet      predniSONE (DELTASONE) 20 MG tablet      calcium carbonate (CALCIUM 600) 600 MG TABS tablet        Allergies  Allergen Reactions  . Penicillins Other (See Comments)    Red spots ### Tolerated Rocephin 12/2012 ###   Follow-up Information    Please follow up.   Why:  MD at SNF       The results of significant diagnostics from this hospitalization (including imaging, microbiology, ancillary and laboratory) are listed below for reference.    Significant Diagnostic Studies: Dg Mandible 4 Views  06/16/2014    CLINICAL DATA:  Post reduction. Temporomandibular joint pain. Best obtainable images due to pt condition.  EXAM: MANDIBLE - 4+ VIEW  COMPARISON:  Current maxillofacial CT  FINDINGS: Temporomandibular joints appear reduced.  No fractures seen.  IMPRESSION: Temporomandibular joints appear to be reduced into the mandibular fossae bilaterally.   Electronically Signed   By: Lajean Manes M.D.   On: 06/16/2014 16:42   Dg Chest 2 View  06/19/2014   CLINICAL DATA:  Weakness.  Evaluate for pneumonia.  EXAM: CHEST  2 VIEW  COMPARISON:  06/16/2014  FINDINGS: The lungs are underinflated. Additionally, the images were acquired with the patient sitting and in the AP orientation. There is a left chest wall pacer device with lead in the right atrial appendage and right ventricle. Normal heart size. No pleural effusion or edema. No airspace consolidation identified.  IMPRESSION: 1. Diminished exam detail due to low lung volumes. 2. No pneumonia identified. There is a continued concern for infection then a repeat chest radiograph in the standing PA projection should be obtained.   Electronically Signed   By: Kerby Moors M.D.   On: 06/19/2014 09:34   Dg Chest 2 View  06/16/2014   CLINICAL DATA:  Difficulty speaking and swallowing.  EXAM: CHEST  2 VIEW  COMPARISON:  PA and lateral chest 04/29/2014 and 12/14/2012.  FINDINGS: Pacing device in place. The lungs are clear. Heart size is normal. No pneumothorax or pleural effusion. Remote lower left rib fracture is noted  IMPRESSION: No acute disease.   Electronically Signed   By: Inge Rise M.D.   On: 06/16/2014 13:38   Ct Head Wo Contrast  06/20/2014   CLINICAL DATA:  Acute onset of lockjaw. Concern for head or cervical spine injury. Initial encounter.  EXAM: CT HEAD WITHOUT CONTRAST  CT MAXILLOFACIAL WITHOUT CONTRAST  CT CERVICAL SPINE WITHOUT CONTRAST  TECHNIQUE: Multidetector CT imaging of the head, cervical spine, and maxillofacial structures were performed using the  standard protocol without intravenous contrast. Multiplanar CT image reconstructions of the cervical spine and maxillofacial structures were also generated.  COMPARISON:  None.  FINDINGS: CT HEAD FINDINGS  There is no evidence of acute infarction, mass lesion, or intra- or extra-axial hemorrhage on CT.  Scattered periventricular and subcortical white matter change likely reflects small vessel ischemic microangiopathy. Mild cortical volume loss is noted, with prominence of the ventricles and sulci. Small chronic lacunar infarcts are seen at the basal ganglia bilaterally.  The brainstem and fourth ventricle are within normal limits. The cerebral hemispheres demonstrate grossly normal gray-white differentiation. No mass effect or midline shift is seen.  There is no evidence of fracture; visualized osseous structures are unremarkable in appearance. The orbits are within normal limits. The paranasal sinuses and mastoid air cells are well-aerated. Soft tissue swelling is noted overlying the left frontal calvarium.  CT MAXILLOFACIAL FINDINGS  There is no evidence of fracture or dislocation. The maxilla and mandible appear intact. The nasal bone is unremarkable in appearance. The visualized dentition demonstrates no acute abnormality.  The orbits are intact bilaterally. The visualized paranasal sinuses and mastoid air cells are well-aerated.  Dense calcification is noted at the carotid bifurcations bilaterally. Soft tissue swelling is noted overlying the left frontal calvarium. The parapharyngeal fat planes are preserved. The nasopharynx, oropharynx and hypopharynx are unremarkable in appearance. The visualized portions of the valleculae and piriform sinuses are grossly unremarkable.  The parotid and submandibular glands are within normal limits. No cervical lymphadenopathy is seen.  CT CERVICAL SPINE FINDINGS  There is no evidence of fracture or subluxation. Vertebral bodies demonstrate normal height and alignment. Minimal  chronic endplate irregularity is noted at the mid cervical spine, and a small posterior disc osteophyte complex is seen at C6-C7. Intervertebral disc spaces are preserved. Prevertebral soft tissues are within normal limits.  The thyroid gland is unremarkable in appearance. The visualized lung apices are clear. No significant soft tissue abnormalities are seen.  IMPRESSION: 1. No evidence of traumatic intracranial injury or fracture. 2. No evidence of fracture or dislocation with regard to the maxillofacial structures. 3. No evidence of fracture or subluxation along the cervical spine. 4. Soft tissue swelling noted overlying the left frontal calvarium. 5. Mild cortical volume loss and scattered small vessel ischemic microangiopathy. Small chronic lacunar infarcts at the basal ganglia bilaterally. 6. Dense calcification at the carotid bifurcations bilaterally. 7. Minimal degenerative change at the mid to lower cervical spine.   Electronically Signed   By: Garald Balding M.D.   On: 06/20/2014 22:07   Ct Angio Chest Pe W/cm &/or Wo Cm  06/21/2014   CLINICAL DATA:  Hypoxia. Nonverbal patient, history of hypertension, tachycardia, bradycardia syndrome.  EXAM: CT ANGIOGRAPHY CHEST WITH CONTRAST  TECHNIQUE: Multidetector CT imaging of the chest was performed using  the standard protocol during bolus administration of intravenous contrast. Multiplanar CT image reconstructions and MIPs were obtained to evaluate the vascular anatomy.  CONTRAST:  1100mL OMNIPAQUE IOHEXOL 350 MG/ML SOLN  COMPARISON:  Chest radiograph June 20, 2014 at 1622 hours  FINDINGS: Adequate pulmonary arterial contrast opacification. Main pulmonary artery is not enlarged. No pulmonary arterial filling defects to the level of the segmental branches though, respiratory motion degrades sensitivity for distal emboli. Heart size is normal. No pericardial fluid collections. Thoracic aorta is normal in course and caliber with mild to moderate calcific  atherosclerosis.  No pleural effusion, focal consolidation. No pneumothorax. Bronchial wall thickening in the lung bases with probable mucous impaction.  Included view of the abdomen is normal. Soft tissues are normal. No destructive bony lesions. Respiratory motion results in spurious appearance of rib fractures. Moderate degenerative change of the shoulders. LEFT cardiac pacemaker results in streak artifact.  Review of the MIP images confirms the above findings.  IMPRESSION: No acute pulmonary embolism on this respiratory motion degraded examination.  Bronchial wall thickening may reflect bronchitis, with probable mucous impaction in the lower lobes without focal consolidation.   Electronically Signed   By: Elon Alas   On: 06/21/2014 00:09   Ct Cervical Spine Wo Contrast  06/20/2014   CLINICAL DATA:  Acute onset of lockjaw. Concern for head or cervical spine injury. Initial encounter.  EXAM: CT HEAD WITHOUT CONTRAST  CT MAXILLOFACIAL WITHOUT CONTRAST  CT CERVICAL SPINE WITHOUT CONTRAST  TECHNIQUE: Multidetector CT imaging of the head, cervical spine, and maxillofacial structures were performed using the standard protocol without intravenous contrast. Multiplanar CT image reconstructions of the cervical spine and maxillofacial structures were also generated.  COMPARISON:  None.  FINDINGS: CT HEAD FINDINGS  There is no evidence of acute infarction, mass lesion, or intra- or extra-axial hemorrhage on CT.  Scattered periventricular and subcortical white matter change likely reflects small vessel ischemic microangiopathy. Mild cortical volume loss is noted, with prominence of the ventricles and sulci. Small chronic lacunar infarcts are seen at the basal ganglia bilaterally.  The brainstem and fourth ventricle are within normal limits. The cerebral hemispheres demonstrate grossly normal gray-white differentiation. No mass effect or midline shift is seen.  There is no evidence of fracture; visualized osseous  structures are unremarkable in appearance. The orbits are within normal limits. The paranasal sinuses and mastoid air cells are well-aerated. Soft tissue swelling is noted overlying the left frontal calvarium.  CT MAXILLOFACIAL FINDINGS  There is no evidence of fracture or dislocation. The maxilla and mandible appear intact. The nasal bone is unremarkable in appearance. The visualized dentition demonstrates no acute abnormality.  The orbits are intact bilaterally. The visualized paranasal sinuses and mastoid air cells are well-aerated.  Dense calcification is noted at the carotid bifurcations bilaterally. Soft tissue swelling is noted overlying the left frontal calvarium. The parapharyngeal fat planes are preserved. The nasopharynx, oropharynx and hypopharynx are unremarkable in appearance. The visualized portions of the valleculae and piriform sinuses are grossly unremarkable.  The parotid and submandibular glands are within normal limits. No cervical lymphadenopathy is seen.  CT CERVICAL SPINE FINDINGS  There is no evidence of fracture or subluxation. Vertebral bodies demonstrate normal height and alignment. Minimal chronic endplate irregularity is noted at the mid cervical spine, and a small posterior disc osteophyte complex is seen at C6-C7. Intervertebral disc spaces are preserved. Prevertebral soft tissues are within normal limits.  The thyroid gland is unremarkable in appearance. The visualized lung apices are clear.  No significant soft tissue abnormalities are seen.  IMPRESSION: 1. No evidence of traumatic intracranial injury or fracture. 2. No evidence of fracture or dislocation with regard to the maxillofacial structures. 3. No evidence of fracture or subluxation along the cervical spine. 4. Soft tissue swelling noted overlying the left frontal calvarium. 5. Mild cortical volume loss and scattered small vessel ischemic microangiopathy. Small chronic lacunar infarcts at the basal ganglia bilaterally. 6.  Dense calcification at the carotid bifurcations bilaterally. 7. Minimal degenerative change at the mid to lower cervical spine.   Electronically Signed   By: Garald Balding M.D.   On: 06/20/2014 22:07   Dg Chest Portable 1 View  06/20/2014   CLINICAL DATA:  TMJ dysfunction.  Locked jaw.  EXAM: PORTABLE CHEST - 1 VIEW  COMPARISON:  06/19/2014  FINDINGS: Dual lead pacer remains in place. Heart size within normal limits. Tortuous thoracic aorta.  Low lung volumes are present, causing crowding of the pulmonary vasculature. Reverse lordotic projection.  The lungs appear clear. Right upper quadrant clips likely from prior cholecystectomy.  Barium contrast medium noted in the colon. This is likely left over from the patient's recent swallowing function study performed on 06/18/2014.  IMPRESSION: 1. The lungs appear clear.  Heart size within normal limits. 2. Dual lead pacer in place.   Electronically Signed   By: Van Clines M.D.   On: 06/20/2014 16:38   Dg Swallowing Func-speech Pathology  06/18/2014   Joaquim Nam, CCC-SLP     06/18/2014 12:28 PM  Objective Swallowing Evaluation: Modified Barium Swallowing Study   Patient Details  Name: Alexya Mcdaris MRN: [id number] Date of Birth: June 01, 1929  Today's Date: 06/18/2014 Time: SLP Start Time (ACUTE ONLY): 1120-SLP Stop Time (ACUTE  ONLY): 1217 SLP Time Calculation (min) (ACUTE ONLY): 57 min  Past Medical History:  Past Medical History  Diagnosis Date  . Tachycardia-bradycardia syndrome     s/p [employer] PPM implant in [place]  . Paroxysmal atrial fibrillation     chads2vasc score is at least 6.  She is not a candidate for  anticoagulation due to falls.  Her AF burden is low.  . Hypertension   . Frequent falls   . Asthma   . Hyperlipidemia   . Depression   . Anxiety   . Pacemaker   . Stroke   . Osteoporosis 06/23/2013  . Vitamin D deficiency   . Sick sinus syndrome   . Hypothyroidism   . Chronic kidney disease   . Fracture of greater trochanter of left femur  04/02/2013  . Hip fracture, left 04/02/2013  . Pelvic fracture 06/23/2013  . Senile osteoporosis 02/26/2014  . Rhinitis, allergic 02/26/2014  . TMJ (dislocation of temporomandibular joint) 06/16/2014   Past Surgical History:  Past Surgical History  Procedure Laterality Date  . Pacemaker insertion  01/04/2005    [place] PPM 1290 [phone]), GDT [phone]  atrial lead and 4457 V lead all implanted in [place]  . Cholecystectomy    . Gallbladder surgery    . Tonsillectomy and adenoidectomy     HPI:  HPI: Fara Worthy is an 81 y.o. female with a PMH of tachybrady  syndrome S/P PM, PAF (CHADS2vasc of 6), not a candidate for  anticoagulation due to high fall risk, and asthma on Advair who  presented with a chief complaint of jaw pain secondary to  dislocation of the jaw in the setting of chronic TMJ. Her jaw  was reduced by EDP, and also complained of  being short of breath  with a cough as well. The patient says she started getting short  of breath yesterday and that she used her inhaler 4 times  yesterday, and once today with little relief. She also reports a  d day history of cough productive of green mucous. Denies any  associated fever/chills, chest pain or nausea/vomiting. She has  had a 4 day history of "trouble swallowing", but no sore throat.  Xray of mandible: Temporomandibular joints appear to be reduced  into the mandibular fossae bilaterally.  No Data Recorded  Assessment / Plan / Recommendation CHL IP CLINICAL IMPRESSIONS 06/18/2014  Dysphagia Diagnosis Mild oral phase dysphagia;Mild pharyngeal  phase dysphagia;Mild cervical esophageal phase dysphagia  Clinical impression Pt exhibits a mild oropharyngeal and  questionable mild esophageal dysphagia, characterized by  difficulty chewing solid foods (due to chronic TMJ dysfunction),  delayed swallow initiation, reduced base of tongue contraction to  the pharyngeal wall, and premature spillage of liquids to the  valleculae and pyriforms.  There was  piecmeal swallows noted with  solids, and residue on the posterior tongue after the swallow (pt  cleared this with a cue to "swallow again)."  The pharynx  appeared to be cleared and the study was ended, but pt began  coughing.  Flouro was turned back on and there appeared to be a  small amount of barium penetrated on the posterior portion of the  airway.  Question reflux?  The esophagus appeared clear prior to  pt coughing, but after, there was a small amount of barium in the  esophagus.        CHL IP TREATMENT RECOMMENDATION 06/18/2014  Treatment Plan Recommendations Therapy as outlined in treatment  plan below    CHL IP DIET RECOMMENDATION 06/18/2014  Diet Recommendations Dysphagia 3 (Mechanical Soft);Thin liquid  Liquid Administration via Cup;No straw  Medication Administration Whole meds with puree  Compensations Slow rate;Small sips/bites  Postural Changes and/or Swallow Maneuvers Seated upright 90  degrees;Upright 30-60 min after meal     CHL IP OTHER RECOMMENDATIONS 06/18/2014  Recommended Consults Consider esophageal assessment  Oral Care Recommendations Oral care BID  Other Recommendations Clarify dietary restrictions     CHL IP FOLLOW UP RECOMMENDATIONS 06/18/2014  Follow up Recommendations Skilled Nursing facility     [facility] IP FREQUENCY AND DURATION 06/18/2014  Speech Therapy Frequency (ACUTE ONLY) min 1 x/week  Treatment Duration 1 week     Pertinent Vitals/Pain Pt complains of throat pain (5).  RN aware.    SLP Swallow Goals No flowsheet data found.  No flowsheet data found.    CHL IP REASON FOR REFERRAL 06/18/2014  Reason for Referral Objectively evaluate swallowing function     CHL IP ORAL PHASE 06/18/2014  Lips (None)  Tongue (None)  Mucous membranes (None)  Nutritional status (None)  Other (None)  Oxygen therapy (None)  Oral Phase Impaired  Oral - Pudding Teaspoon (None)  Oral - Pudding Cup (None)  Oral - Honey Teaspoon (None)  Oral - Honey Cup (None)  Oral - Honey Syringe (None)  Oral - Nectar Teaspoon  (None)  Oral - Nectar Cup (None)  Oral - Nectar Straw (None)  Oral - Nectar Syringe (None)  Oral - Ice Chips (None)  Oral - Thin Teaspoon (None)  Oral - Thin Cup (None)  Oral - Thin Straw (None)  Oral - Thin Syringe (None)  Oral - Puree (None)  Oral - Mechanical Soft (None)  Oral - Regular Impaired mastication  Oral - Multi-consistency (  None)  Oral - Pill (None)  Oral Phase - Comment (None)      CHL IP PHARYNGEAL PHASE 06/18/2014  Pharyngeal Phase Impaired  Pharyngeal - Pudding Teaspoon (None)  Penetration/Aspiration details (pudding teaspoon) (None)  Pharyngeal - Pudding Cup (None)  Penetration/Aspiration details (pudding cup) (None)  Pharyngeal - Honey Teaspoon (None)  Penetration/Aspiration details (honey teaspoon) (None)  Pharyngeal - Honey Cup (None)  Penetration/Aspiration details (honey cup) (None)  Pharyngeal - Honey Syringe (None)  Penetration/Aspiration details (honey syringe) (None)  Pharyngeal - Nectar Teaspoon (None)  Penetration/Aspiration details (nectar teaspoon) (None)  Pharyngeal - Nectar Cup Delayed swallow initiation;Premature  spillage to valleculae  Penetration/Aspiration details (nectar cup) (None)  Pharyngeal - Nectar Straw (None)  Penetration/Aspiration details (nectar straw) (None)  Pharyngeal - Nectar Syringe (None)  Penetration/Aspiration details (nectar syringe) (None)  Pharyngeal - Ice Chips (None)  Penetration/Aspiration details (ice chips) (None)  Pharyngeal - Thin Teaspoon (None)  Penetration/Aspiration details (thin teaspoon) (None)  Pharyngeal - Thin Cup Delayed swallow initiation;Premature  spillage to valleculae;Premature spillage to pyriform sinuses  Penetration/Aspiration details (thin cup) (None)  Pharyngeal - Thin Straw Delayed swallow initiation;Premature  spillage to valleculae;Premature spillage to pyriform sinuses  Penetration/Aspiration details (thin straw) (None)  Pharyngeal - Thin Syringe (None)  Penetration/Aspiration details (thin syringe') (None)  Pharyngeal - Puree  (None)  Penetration/Aspiration details (puree) (None)  Pharyngeal - Mechanical Soft (None)  Penetration/Aspiration details (mechanical soft) (None)  Pharyngeal - Regular (None)  Penetration/Aspiration details (regular) (None)  Pharyngeal - Multi-consistency (None)  Penetration/Aspiration details (multi-consistency) (None)  Pharyngeal - Pill (None)  Penetration/Aspiration details (pill) (None)  Pharyngeal Comment (None)     CHL IP CERVICAL ESOPHAGEAL PHASE 06/18/2014  Cervical Esophageal Phase Impaired  Pudding Teaspoon (None)  Pudding Cup (None)  Honey Teaspoon (None)  Honey Cup (None)  Honey Syringe (None)  Nectar Teaspoon (None)  Nectar Cup (None)  Nectar Straw (None)  Nectar Syringe (None)  Thin Teaspoon (None)  Thin Cup (None)  Thin Straw (None)  Thin Syringe (None)  Cervical Esophageal Comment See impressions    No flowsheet data found.         Quinn Axe T 06/18/2014, 12:18 PM    Ct Maxillofacial Wo Cm  06/16/2014   CLINICAL DATA:  Temporomandibular joint pain.  EXAM: CT MAXILLOFACIAL WITHOUT CONTRAST  TECHNIQUE: Multidetector CT imaging of the maxillofacial structures was performed. Multiplanar CT image reconstructions were also generated. A small metallic BB was placed on the right temple in order to reliably differentiate right from left.  COMPARISON:  Neck CT 04/30/2014  FINDINGS: Chronic or recurrent anterior dislocation of the temporomandibular joints. There is no associated fracture or erosion.  Mild inflammatory mucosal thickening in the paranasal sinuses. No evidence of acute sinusitis.  Bilateral cataract resection.  Otherwise, the orbits are negative.  Limited intracranial imaging is negative for acute disease.  IMPRESSION: Chronic or recurrent bilateral TMJ dislocation.   Electronically Signed   By: Monte Fantasia M.D.   On: 06/16/2014 13:53   [place] Cm  06/20/2014   CLINICAL DATA:  Acute onset of lockjaw. Concern for head or cervical spine injury. Initial encounter.  EXAM:  CT HEAD WITHOUT CONTRAST  CT MAXILLOFACIAL WITHOUT CONTRAST  CT CERVICAL SPINE WITHOUT CONTRAST  TECHNIQUE: Multidetector CT imaging of the head, cervical spine, and maxillofacial structures were performed using the standard protocol without intravenous contrast. Multiplanar CT image reconstructions of the cervical spine and maxillofacial structures were also generated.  COMPARISON:  None.  FINDINGS: CT HEAD  FINDINGS  There is no evidence of acute infarction, mass lesion, or intra- or extra-axial hemorrhage on CT.  Scattered periventricular and subcortical white matter change likely reflects small vessel ischemic microangiopathy. Mild cortical volume loss is noted, with prominence of the ventricles and sulci. Small chronic lacunar infarcts are seen at the basal ganglia bilaterally.  The brainstem and fourth ventricle are within normal limits. The cerebral hemispheres demonstrate grossly normal gray-white differentiation. No mass effect or midline shift is seen.  There is no evidence of fracture; visualized osseous structures are unremarkable in appearance. The orbits are within normal limits. The paranasal sinuses and mastoid air cells are well-aerated. Soft tissue swelling is noted overlying the left frontal calvarium.  CT MAXILLOFACIAL FINDINGS  There is no evidence of fracture or dislocation. The maxilla and mandible appear intact. The nasal bone is unremarkable in appearance. The visualized dentition demonstrates no acute abnormality.  The orbits are intact bilaterally. The visualized paranasal sinuses and mastoid air cells are well-aerated.  Dense calcification is noted at the carotid bifurcations bilaterally. Soft tissue swelling is noted overlying the left frontal calvarium. The parapharyngeal fat planes are preserved. The nasopharynx, oropharynx and hypopharynx are unremarkable in appearance. The visualized portions of the valleculae and piriform sinuses are grossly unremarkable.  The parotid and  submandibular glands are within normal limits. No cervical lymphadenopathy is seen.  CT CERVICAL SPINE FINDINGS  There is no evidence of fracture or subluxation. Vertebral bodies demonstrate normal height and alignment. Minimal chronic endplate irregularity is noted at the mid cervical spine, and a small posterior disc osteophyte complex is seen at C6-C7. Intervertebral disc spaces are preserved. Prevertebral soft tissues are within normal limits.  The thyroid gland is unremarkable in appearance. The visualized lung apices are clear. No significant soft tissue abnormalities are seen.  IMPRESSION: 1. No evidence of traumatic intracranial injury or fracture. 2. No evidence of fracture or dislocation with regard to the maxillofacial structures. 3. No evidence of fracture or subluxation along the cervical spine. 4. Soft tissue swelling noted overlying the left frontal calvarium. 5. Mild cortical volume loss and scattered small vessel ischemic microangiopathy. Small chronic lacunar infarcts at the basal ganglia bilaterally. 6. Dense calcification at the carotid bifurcations bilaterally. 7. Minimal degenerative change at the mid to lower cervical spine.   Electronically Signed   By: Garald Balding M.D.   On: 06/20/2014 22:07    Microbiology: Recent Results (from the past 240 hour(s))  MRSA PCR Screening     Status: None   Collection Time: 06/16/14  6:16 PM  Result Value Ref Range Status   MRSA by PCR NEGATIVE NEGATIVE Final    Comment:        The GeneXpert MRSA Assay (FDA approved for NASAL specimens only), is one component of a comprehensive MRSA colonization surveillance program. It is not intended to diagnose MRSA infection nor to guide or monitor treatment for MRSA infections.      Labs: Basic Metabolic Panel:  Recent Labs Lab 06/19/14 0418 06/20/14 1633 06/21/14 0507  NA 139 139 137  K 3.2* 3.3* 3.7  CL 99 101 98  CO2 29 31 29   GLUCOSE 119* 120* 219*  BUN 25* 33* 34*  CREATININE  0.60 0.60 0.76  CALCIUM 9.6 9.2 8.9   Liver Function Tests:  Recent Labs Lab 06/19/14 0418 06/21/14 0507  AST 19 20  ALT 21 30  ALKPHOS 68 62  BILITOT 0.5 0.4  PROT 7.1 6.2  ALBUMIN 3.6 2.9*   No  results for input(s): LIPASE, AMYLASE in the last 168 hours. No results for input(s): AMMONIA in the last 168 hours. CBC:  Recent Labs Lab 06/19/14 0418 06/20/14 1633 06/21/14 0507  WBC 12.7* 14.3* 9.2  NEUTROABS 9.7* 8.7* 8.1*  HGB 15.0 16.0* 14.4  HCT 45.5 50.3* 45.2  MCV 87.3 88.6 88.1  PLT 335 381 327   Cardiac Enzymes: No results for input(s): CKTOTAL, CKMB, CKMBINDEX, TROPONINI in the last 168 hours. BNP: BNP (last 3 results)  Recent Labs  06/16/14 1448  BNP 125.6*    ProBNP (last 3 results) No results for input(s): PROBNP in the last 8760 hours.  CBG:  Recent Labs Lab 06/18/14 1716 06/18/14 2131 06/19/14 0754 06/19/14 1141  GLUCAP 175* 218* 108* 97       Signed:  Jance Siek  [facility] 06/25/2014, 2:01 PM

## 2014-06-26 ENCOUNTER — Non-Acute Institutional Stay (SKILLED_NURSING_FACILITY): Payer: Medicare Other | Admitting: Internal Medicine

## 2014-06-26 DIAGNOSIS — I480 Paroxysmal atrial fibrillation: Secondary | ICD-10-CM

## 2014-06-26 DIAGNOSIS — E785 Hyperlipidemia, unspecified: Secondary | ICD-10-CM | POA: Diagnosis not present

## 2014-06-26 DIAGNOSIS — I739 Peripheral vascular disease, unspecified: Secondary | ICD-10-CM

## 2014-06-26 DIAGNOSIS — I10 Essential (primary) hypertension: Secondary | ICD-10-CM

## 2014-06-26 DIAGNOSIS — J9621 Acute and chronic respiratory failure with hypoxia: Secondary | ICD-10-CM

## 2014-06-26 DIAGNOSIS — R1312 Dysphagia, oropharyngeal phase: Secondary | ICD-10-CM

## 2014-06-26 DIAGNOSIS — F0391 Unspecified dementia with behavioral disturbance: Secondary | ICD-10-CM | POA: Diagnosis not present

## 2014-06-26 DIAGNOSIS — Z950 Presence of cardiac pacemaker: Secondary | ICD-10-CM

## 2014-06-26 DIAGNOSIS — S030XXS Dislocation of jaw, sequela: Secondary | ICD-10-CM | POA: Diagnosis not present

## 2014-06-26 DIAGNOSIS — E876 Hypokalemia: Secondary | ICD-10-CM

## 2014-06-26 DIAGNOSIS — F03918 Unspecified dementia, unspecified severity, with other behavioral disturbance: Secondary | ICD-10-CM

## 2014-06-26 DIAGNOSIS — J45909 Unspecified asthma, uncomplicated: Secondary | ICD-10-CM

## 2014-06-26 DIAGNOSIS — S0300XS Dislocation of jaw, unspecified side, sequela: Secondary | ICD-10-CM

## 2014-06-26 DIAGNOSIS — E43 Unspecified severe protein-calorie malnutrition: Secondary | ICD-10-CM

## 2014-06-26 NOTE — Progress Notes (Signed)
Patient ID: Yvonne Buck, female   DOB: 1930/01/01, 81 y.o.   MRN: [id number]     Facility: [facility] : [facility]   PCP: Blanchie Serve, MD  Code Status: DNR  Allergies  Allergen Reactions  . Penicillins Other (See Comments)    Red spots ### Tolerated Rocephin 12/2012 ###    Chief Complaint  Patient presents with  . Readmit To SNF     HPI:  81 year old patient is here for long term care post hospital admission from 06/16/14-06/19/14 with jaw dislocation and asthma flare and then another admission from 06/20/14-06/25/14 with acute respiratory failure and jaw dislocation. She was treated with antibiotics, steroids, nebulizers and her dislocation was reduced. She has high chance for recurrence of dislocation. She has history of afib, HTN, asthma, protein calorie malnutrition. She is seen in her room today. She is in no distress, is pleasantly confused trying to pull out her o2. No falls in the facility. Unable to obtain HPI or ROS from patient  Review of Systems:  Unable to obtain.    Past Medical History  Diagnosis Date  . Tachycardia-bradycardia syndrome     s/p [employer] PPM implant in [place]  . Paroxysmal atrial fibrillation     chads2vasc score is at least 6.  She is not a candidate for anticoagulation due to falls.  Her AF burden is low.  . Hypertension   . Frequent falls   . Asthma   . Hyperlipidemia   . Depression   . Anxiety   . Pacemaker   . Stroke   . Osteoporosis 06/23/2013  . Vitamin D deficiency   . Sick sinus syndrome   . Hypothyroidism   . Chronic kidney disease   . Fracture of greater trochanter of left femur 04/02/2013  . Hip fracture, left 04/02/2013  . Pelvic fracture 06/23/2013  . Senile osteoporosis 02/26/2014  . Rhinitis, allergic 02/26/2014  . TMJ (dislocation of temporomandibular joint) 06/16/2014   Past Surgical History  Procedure Laterality Date  . Pacemaker insertion  01/04/2005    [place] PPM 1290  [phone]), GDT [phone] atrial lead and 4457 V lead all implanted in [place]  . Cholecystectomy    . Gallbladder surgery    . Tonsillectomy and adenoidectomy     Social History:   reports that she has never smoked. She has never used smokeless tobacco. She reports that she does not drink alcohol or use illicit drugs.  Family History  Problem Relation Age of Onset  . Diabetes Mother   . High blood pressure Mother   . Heart Problems Father     Medications: Patient's Medications  New Prescriptions   No medications on file  Previous Medications   ALBUTEROL (PROVENTIL HFA;VENTOLIN HFA) 108 (90 BASE) MCG/ACT INHALER    Inhale 2 puffs into the lungs every 6 (six) hours as needed for wheezing or shortness of breath.   AMLODIPINE (NORVASC) 10 MG TABLET    Take 10 mg by mouth at bedtime.   ASPIRIN 81 MG CHEWABLE TABLET    Chew 81 mg by mouth daily.   FEEDING SUPPLEMENT, ENSURE ENLIVE, (ENSURE ENLIVE) LIQD    Take 237 mLs by mouth 2 (two) times daily between meals.   FLUTICASONE-SALMETEROL (ADVAIR) 250-50 MCG/DOSE AEPB    Inhale 1 puff into the lungs 2 (two) times daily.   HYDROCHLOROTHIAZIDE (HYDRODIURIL) 25 MG TABLET    Take 1 tablet (25 mg total) by mouth daily.   ISOSORBIDE MONONITRATE (  IMDUR) 30 MG 24 HR TABLET    Take 30 mg by mouth every morning.   LOSARTAN (COZAAR) 100 MG TABLET    Take 100 mg by mouth daily.   METOPROLOL (LOPRESSOR) 50 MG TABLET    Take 50 mg by mouth 2 (two) times daily.   MONTELUKAST (SINGULAIR) 10 MG TABLET    Take 10 mg by mouth at bedtime.   POTASSIUM CHLORIDE SA (K-DUR,KLOR-CON) 20 MEQ TABLET    Take 20 mEq by mouth daily.   SIMVASTATIN (ZOCOR) 40 MG TABLET    Take 40 mg by mouth daily.  Modified Medications   No medications on file  Discontinued Medications   No medications on file     Physical Exam: 118/66, 78/min, 98.1, 18/min  Constitutional: frail, elderly female patient and in no acute distress.   Head: has a roll/ band around her head and chin to hold  her jaw in place, resolving bruise on left eye area Neck: Neck supple. No JVD present.   Nose: no sinus tenderness Mouth: poor dentition, able to open and close her mouth without problem Cardiovascular: Normal rate, regular rhythm and intact distal pulses. left chest wall with pacemaker Respiratory: poor air entry with wheezing present. No respiratory distress. On o2 by [place] [facility]: Soft. Bowel sounds are normal. She exhibits no distension. There is no tenderness.  Musculoskeletal: able to move all 4 extremities, on geri chair Neurological: She is alert, oriented to self only Skin: Skin is warm and dry. She is not diaphoretic.  Psychiatric: poor insight and judgement    Labs reviewed: Basic Metabolic Panel:  Recent Labs  06/19/14 0418 06/20/14 1633 06/21/14 0507  NA 139 139 137  K 3.2* 3.3* 3.7  CL 99 101 98  CO2 29 31 29   GLUCOSE 119* 120* 219*  BUN 25* 33* 34*  CREATININE 0.60 0.60 0.76  CALCIUM 9.6 9.2 8.9   Liver Function Tests:  Recent Labs  04/30/14 1548 06/19/14 0418 06/21/14 0507  AST 15 19 20   ALT 14 21 30   ALKPHOS 61 68 62  BILITOT 0.7 0.5 0.4  PROT 5.9* 7.1 6.2  ALBUMIN 3.1* 3.6 2.9*   No results for input(s): LIPASE, AMYLASE in the last 8760 hours. No results for input(s): AMMONIA in the last 8760 hours. CBC:  Recent Labs  06/19/14 0418 06/20/14 1633 06/21/14 0507  WBC 12.7* 14.3* 9.2  NEUTROABS 9.7* 8.7* 8.1*  HGB 15.0 16.0* 14.4  HCT 45.5 50.3* 45.2  MCV 87.3 88.6 88.1  PLT 335 381 327   Cardiac Enzymes: No results for input(s): CKTOTAL, CKMB, CKMBINDEX, TROPONINI in the last 8760 hours. BNP: Invalid input(s): POCBNP CBG:  Recent Labs  06/18/14 2131 06/19/14 0754 06/19/14 1141  GLUCAP 218* 108* 97     Assessment/Plan  Acute hypoxic respiratory failure Thought to be in setting of bronchitis and asthma exacerbation. Completed antibiotic course. With her wheezing and poor air entry, add duoneb qid for next 5 days, then prn. It will  be difficult to do pulmonary toileting exercises. Patient has trouble keeping the o2, to use it as tolerated  Recurrent locked jaw chronic TMJ dislocation unclear etiology. Reduction done recently. High change for recurrent dislocation. Pending discussion with family on oral surgery appointment vs palliative care. palliative care consult has been provided for time being as daughter is reviewing care plan with her brother to come to a mutual decision  Asthma Recent exacerbation, completed course of prednisone and antibiotics. Nebulizer rx added as above. Will wean  her off o2 as tolerated to keep o2 sat > 90%. Continue advair and singulair  Chronic A. fib Rate controlled. Continue lopressor 50 mg bid and aspirin. Off anticoagulation with high fall risk  Severe protein calorie malnutrition Continue puree diet with thin liquids for now. Monitor weight. Poor overall prognosis with her dementia with behavioral disturbance and recurrent TMJ dislocation. Palliative care consult  Dementia with behavioral disturbance Behavior has been overall calm and better. Off all psych medication after developing EPS from it. Decline anticipated. High fall risk. Fall precautions and pressure ulcer prophylaxis, under complete care  Hyperlipidemia Continue zocor 40 mg daily  Hypokalemia Is on hctz. Continue kcl supplement  Essential hypertension Stable, continue amlodipine 10 mg daily, hctz 25 mg daily, losartan 10 mg daily, lopressor 50 mg bid and imdur 30 mg daily  PVD Stable, continue aspirin  Cardiac pacemaker in situ Stable. Placed 12/2004. Pacer check every 6 month with cardiology Dr Rayann Heman  Dysphagia Oropharyngeal phase. Continue purred food with thin liquids for now. To be followed by SLP team  Goals of care: long term care   Family/ staff Communication: reviewed care plan with nursing supervisor and Tomma Lightning, [place], [place] [phone]  (Monday-Friday 8 am - 5 pm) [phone] (afterhours)

## 2014-06-26 NOTE — Progress Notes (Signed)
Patient is set to discharge back to [facility] today. Patient & daughter, [name] aware. Discharge packet given to RN. [facility] called for transport.     Raynaldo Opitz, [facility] Clinical Social Worker cell #: [phone]

## 2014-06-28 ENCOUNTER — Encounter (HOSPITAL_COMMUNITY): Payer: Self-pay | Admitting: *Deleted

## 2014-06-28 ENCOUNTER — Emergency Department (HOSPITAL_COMMUNITY)
Admission: EM | Admit: 2014-06-28 | Discharge: 2014-06-29 | Disposition: A | Discharge status: Home / Self Care | Payer: Medicare Other | Attending: Emergency Medicine | Admitting: Emergency Medicine

## 2014-06-28 DIAGNOSIS — I129 Hypertensive chronic kidney disease with stage 1 through stage 4 chronic kidney disease, or unspecified chronic kidney disease: Secondary | ICD-10-CM | POA: Insufficient documentation

## 2014-06-28 DIAGNOSIS — Y9289 Other specified places as the place of occurrence of the external cause: Secondary | ICD-10-CM | POA: Diagnosis not present

## 2014-06-28 DIAGNOSIS — X58XXXA Exposure to other specified factors, initial encounter: Secondary | ICD-10-CM | POA: Diagnosis not present

## 2014-06-28 DIAGNOSIS — S030XXA Dislocation of jaw, initial encounter: Secondary | ICD-10-CM | POA: Diagnosis not present

## 2014-06-28 DIAGNOSIS — E785 Hyperlipidemia, unspecified: Secondary | ICD-10-CM | POA: Insufficient documentation

## 2014-06-28 DIAGNOSIS — F419 Anxiety disorder, unspecified: Secondary | ICD-10-CM | POA: Insufficient documentation

## 2014-06-28 DIAGNOSIS — Z8739 Personal history of other diseases of the musculoskeletal system and connective tissue: Secondary | ICD-10-CM | POA: Insufficient documentation

## 2014-06-28 DIAGNOSIS — J45909 Unspecified asthma, uncomplicated: Secondary | ICD-10-CM | POA: Diagnosis not present

## 2014-06-28 DIAGNOSIS — Y9389 Activity, other specified: Secondary | ICD-10-CM | POA: Insufficient documentation

## 2014-06-28 DIAGNOSIS — Z8781 Personal history of (healed) traumatic fracture: Secondary | ICD-10-CM | POA: Diagnosis not present

## 2014-06-28 DIAGNOSIS — Z79899 Other long term (current) drug therapy: Secondary | ICD-10-CM | POA: Diagnosis not present

## 2014-06-28 DIAGNOSIS — Y998 Other external cause status: Secondary | ICD-10-CM | POA: Diagnosis not present

## 2014-06-28 DIAGNOSIS — Z791 Long term (current) use of non-steroidal anti-inflammatories (NSAID): Secondary | ICD-10-CM | POA: Insufficient documentation

## 2014-06-28 DIAGNOSIS — Z8673 Personal history of transient ischemic attack (TIA), and cerebral infarction without residual deficits: Secondary | ICD-10-CM | POA: Insufficient documentation

## 2014-06-28 DIAGNOSIS — E039 Hypothyroidism, unspecified: Secondary | ICD-10-CM | POA: Insufficient documentation

## 2014-06-28 DIAGNOSIS — S0993XA Unspecified injury of face, initial encounter: Secondary | ICD-10-CM | POA: Diagnosis present

## 2014-06-28 DIAGNOSIS — Z950 Presence of cardiac pacemaker: Secondary | ICD-10-CM | POA: Insufficient documentation

## 2014-06-28 DIAGNOSIS — N189 Chronic kidney disease, unspecified: Secondary | ICD-10-CM | POA: Diagnosis not present

## 2014-06-28 DIAGNOSIS — Z9181 History of falling: Secondary | ICD-10-CM | POA: Insufficient documentation

## 2014-06-28 DIAGNOSIS — S0300XA Dislocation of jaw, unspecified side, initial encounter: Secondary | ICD-10-CM

## 2014-06-28 DIAGNOSIS — Z880 Allergy status to penicillin: Secondary | ICD-10-CM | POA: Insufficient documentation

## 2014-06-28 MED ORDER — MORPHINE SULFATE 2 MG/ML IJ SOLN
2.0000 mg | Freq: Once | INTRAMUSCULAR | Status: AC
  Administered 2014-06-28: 2 mg via INTRAVENOUS
  Filled 2014-06-28: qty 1

## 2014-06-28 MED ORDER — LORAZEPAM 2 MG/ML IJ SOLN
1.0000 mg | Freq: Once | INTRAMUSCULAR | Status: AC
  Administered 2014-06-28: 1 mg via INTRAVENOUS
  Filled 2014-06-28: qty 1

## 2014-06-28 NOTE — ED Notes (Signed)
MD at bedside. 

## 2014-06-28 NOTE — Discharge Instructions (Signed)
Jaw Dislocation A jaw dislocation is the displacement of the joint where the upper jaw bone (maxilla) and the lower jaw bone (mandibula) meet (temporomandibular joint). Soon after the dislocation, the jaw muscles tighten. This prevents the mouth from closing normally.  CAUSES A jaw dislocation usually is caused by a sudden forceful impact to the jaw. A strong punch in the jaw during a fist fight or a sports injury are examples of causes of jaw dislocation. Another cause is injury due to car or motorcycle accidents. RISK FACTORS Although anyone can have a jaw dislocation, some people are more at risk than others. People at increased risk for jaw dislocation include participants in contact sports. SYMPTOMS Symptoms of jaw dislocation can vary, depending on the severity of the dislocation. They can include:  Feeling that your teeth are out of alignment when you bite.  Inability to close your mouth completely.  Drooling.  Extreme pain, with the inability to move your jaw. DIAGNOSIS Your caregiver will feel your temporomandibular joints and ask you to move your jaw. Your caregiver also will feel the inside of your mouth to make sure there are no fractures or cuts (lacerations). TREATMENT Your caregiver will manipulate your jaw to put it back into place (reduction). If you have any jaw fractures from the dislocation, they usually will be held in place with plates and screws or with wiring.  HOME CARE INSTRUCTIONS The following measures can help to reduce pain and hasten the healing process:  Rest your injured joint. Do not move it. Avoid activities similar to the one that caused your injury.  Apply ice to your injured joint for 1 to 2 days after your reduction or as directed by your caregiver. Applying ice helps to reduce inflammation and pain.  Put ice in a plastic bag.  Place a towel between your skin and the bag.  Leave the ice on for 15-20 minutes at a time, 03-04 times a day.  Take  over-the-counter or prescription medication for pain as directed by your caregiver. Also, your caregiver may instruct you to only have certain foods until your jaw heals. These foods may be soft or liquified so that your jaw does not have to move much to eat them. SEEK IMMEDIATE MEDICAL CARE IF:  You have plates and screws or wiring to hold your jaw together that becomes loose or damaged.  You develop drainage from any of the cuts (incisions) where your wires or plates and screws were placed.  Your pain becomes worse rather than better. MAKE SURE YOU:  Understand these instructions.  Will watch your condition.  Will get help right away if you are not doing well or get worse. Document Released: 02/05/2000 Document Revised: 05/02/2011 Document Reviewed: 07/08/2010 [facility] Patient Information 2015 [place], [place]. This information is not intended to replace advice given to you by your health care provider. Make sure you discuss any questions you have with your health care provider.

## 2014-06-28 NOTE — ED Provider Notes (Signed)
CSN: [id number]     Arrival date & time 06/28/14  2109 History   First MD Initiated Contact with Patient 06/28/14 2203     Chief Complaint  Patient presents with  . Mouth Injury     (Consider location/radiation/quality/duration/timing/severity/associated sxs/prior Treatment) Patient is a 81 y.o. female presenting with mouth injury. The history is provided by the patient. No language interpreter was used.  Mouth Injury This is a recurrent problem. The current episode started today. The problem occurs constantly. The problem has been unchanged. Associated symptoms comments: Jaw dislocation. Nothing aggravates the symptoms. She has tried nothing for the symptoms. The treatment provided moderate relief.    Past Medical History  Diagnosis Date  . Tachycardia-bradycardia syndrome     s/p [employer] PPM implant in [place]  . Paroxysmal atrial fibrillation     chads2vasc score is at least 6.  She is not a candidate for anticoagulation due to falls.  Her AF burden is low.  . Hypertension   . Frequent falls   . Asthma   . Hyperlipidemia   . Depression   . Anxiety   . Pacemaker   . Stroke   . Osteoporosis 06/23/2013  . Vitamin D deficiency   . Sick sinus syndrome   . Hypothyroidism   . Chronic kidney disease   . Fracture of greater trochanter of left femur 04/02/2013  . Hip fracture, left 04/02/2013  . Pelvic fracture 06/23/2013  . Senile osteoporosis 02/26/2014  . Rhinitis, allergic 02/26/2014  . TMJ (dislocation of temporomandibular joint) 06/16/2014   Past Surgical History  Procedure Laterality Date  . Pacemaker insertion  01/04/2005    [place] PPM 1290 [phone]), GDT [phone] atrial lead and 4457 V lead all implanted in [place]  . Cholecystectomy    . Gallbladder surgery    . Tonsillectomy and adenoidectomy     Family History  Problem Relation Age of Onset  . Diabetes Mother   . High blood pressure Mother   . Heart Problems Father    History  Substance Use Topics   . Smoking status: Never Smoker   . Smokeless tobacco: Never Used  . Alcohol Use: No   OB History    No data available     Review of Systems  All other systems reviewed and are negative.     Allergies  Penicillins  Home Medications   Prior to Admission medications   Medication Sig Start Date End Date Taking? Authorizing Provider  amLODipine (NORVASC) 10 MG tablet Take 10 mg by mouth at bedtime.   Yes Historical Provider, MD  aspirin EC 81 MG tablet Take 81 mg by mouth daily.   Yes Historical Provider, MD  cholecalciferol (VITAMIN D) 1000 UNITS tablet Take 1,000 Units by mouth daily.   Yes Historical Provider, MD  Fluticasone-Salmeterol (ADVAIR) 250-50 MCG/DOSE AEPB Inhale 1 puff into the lungs 2 (two) times daily.   Yes Historical Provider, MD  ipratropium (ATROVENT) 0.02 % nebulizer solution Take 0.5 mg by nebulization every 6 (six) hours. While awake- for 5 days starting 06/27/14 06/27/14 07/01/14 Yes Historical Provider, MD  isosorbide mononitrate (IMDUR) 30 MG 24 hr tablet Take 30 mg by mouth every morning.   Yes Historical Provider, MD  losartan (COZAAR) 100 MG tablet Take 100 mg by mouth daily.   Yes Historical Provider, MD  metoprolol (LOPRESSOR) 50 MG tablet Take 50 mg by mouth 2 (two) times daily.   Yes Historical Provider, MD  montelukast (SINGULAIR) 10 MG tablet  Take 10 mg by mouth at bedtime.   Yes Historical Provider, MD  potassium chloride SA (K-DUR,KLOR-CON) 20 MEQ tablet Take 20 mEq by mouth daily.   Yes Historical Provider, MD  simvastatin (ZOCOR) 40 MG tablet Take 40 mg by mouth daily.   Yes Historical Provider, MD  albuterol (PROVENTIL HFA;VENTOLIN HFA) 108 (90 BASE) MCG/ACT inhaler Inhale 2 puffs into the lungs every 6 (six) hours as needed for wheezing or shortness of breath. 06/19/14   Nita Sells, MD  feeding supplement, ENSURE ENLIVE, (ENSURE ENLIVE) LIQD Take 237 mLs by mouth 2 (two) times daily between meals. 06/25/14   Nishant Dhungel, MD   hydrochlorothiazide (HYDRODIURIL) 25 MG tablet Take 1 tablet (25 mg total) by mouth daily. Patient not taking: Reported on 06/28/2014 07/07/13   Gerlene Fee, NP   BP 121/64 mmHg  Pulse 59  Resp 22  Ht 5\' 2"  (1.575 m)  Wt 110 lb (49.896 kg)  BMI 20.11 kg/m2  SpO2 93% Physical Exam  Constitutional: She appears well-developed and well-nourished.  HENT:  Mouth open, obvious dislocation  Cardiovascular: Normal rate.   Pulmonary/Chest: Effort normal.  Neurological: She is alert.  Skin: Skin is warm.  Nursing note and vitals reviewed.   ED Course  Reduction of dislocation Date/Time: 06/28/2014 11:57 PM Performed by: Fransico Meadow Authorized by: Fransico Meadow Consent: Verbal consent obtained. Consent given by: patient Patient understanding: patient states understanding of the procedure being performed Patient identity confirmed: verbally with patient Local anesthesia used: no Patient tolerance: Patient tolerated the procedure well with no immediate complications Comments: Pt given ativan and morphine.   Mandible reduced with traction  By Dr. Stark Jock.   (including critical care time) Labs Review Labs Reviewed - No data to display  Imaging Review No results found.   EKG Interpretation None      MDM   Final diagnoses:  Dislocated mandible, initial encounter        Fransico Meadow, PA-C 06/28/14 [place], MD 06/29/14 [phone]

## 2014-06-28 NOTE — ED Notes (Signed)
Pt. Has a hx of jaw dislocations. Pt. Was eating dinner and her jaw dislocated. Bruising on face from recent jaw reset

## 2014-06-29 NOTE — ED Notes (Signed)
Attempted to dress pt.  Pt clothing had vomit and urine on them.  Pt left in gown for transport.

## 2014-06-29 NOTE — ED Notes (Signed)
Pt. Left with all belongings 

## 2014-07-05 ENCOUNTER — Emergency Department (HOSPITAL_COMMUNITY)
Admission: EM | Admit: 2014-07-05 | Discharge: 2014-07-05 | Disposition: A | Discharge status: Home / Self Care | Payer: Medicare Other | Attending: Emergency Medicine | Admitting: Emergency Medicine

## 2014-07-05 ENCOUNTER — Encounter (HOSPITAL_COMMUNITY): Payer: Self-pay | Admitting: Emergency Medicine

## 2014-07-05 DIAGNOSIS — X58XXXA Exposure to other specified factors, initial encounter: Secondary | ICD-10-CM | POA: Diagnosis not present

## 2014-07-05 DIAGNOSIS — Y998 Other external cause status: Secondary | ICD-10-CM | POA: Diagnosis not present

## 2014-07-05 DIAGNOSIS — Z8659 Personal history of other mental and behavioral disorders: Secondary | ICD-10-CM | POA: Insufficient documentation

## 2014-07-05 DIAGNOSIS — S030XXA Dislocation of jaw, initial encounter: Secondary | ICD-10-CM | POA: Insufficient documentation

## 2014-07-05 DIAGNOSIS — I480 Paroxysmal atrial fibrillation: Secondary | ICD-10-CM | POA: Insufficient documentation

## 2014-07-05 DIAGNOSIS — M810 Age-related osteoporosis without current pathological fracture: Secondary | ICD-10-CM | POA: Insufficient documentation

## 2014-07-05 DIAGNOSIS — E785 Hyperlipidemia, unspecified: Secondary | ICD-10-CM | POA: Insufficient documentation

## 2014-07-05 DIAGNOSIS — S0083XA Contusion of other part of head, initial encounter: Secondary | ICD-10-CM | POA: Diagnosis not present

## 2014-07-05 DIAGNOSIS — I129 Hypertensive chronic kidney disease with stage 1 through stage 4 chronic kidney disease, or unspecified chronic kidney disease: Secondary | ICD-10-CM | POA: Insufficient documentation

## 2014-07-05 DIAGNOSIS — Z7951 Long term (current) use of inhaled steroids: Secondary | ICD-10-CM | POA: Diagnosis not present

## 2014-07-05 DIAGNOSIS — N189 Chronic kidney disease, unspecified: Secondary | ICD-10-CM | POA: Insufficient documentation

## 2014-07-05 DIAGNOSIS — J45909 Unspecified asthma, uncomplicated: Secondary | ICD-10-CM | POA: Diagnosis not present

## 2014-07-05 DIAGNOSIS — S0300XA Dislocation of jaw, unspecified side, initial encounter: Secondary | ICD-10-CM

## 2014-07-05 DIAGNOSIS — Z79899 Other long term (current) drug therapy: Secondary | ICD-10-CM | POA: Diagnosis not present

## 2014-07-05 DIAGNOSIS — Y92128 Other place in nursing home as the place of occurrence of the external cause: Secondary | ICD-10-CM | POA: Insufficient documentation

## 2014-07-05 DIAGNOSIS — Z7982 Long term (current) use of aspirin: Secondary | ICD-10-CM | POA: Diagnosis not present

## 2014-07-05 DIAGNOSIS — Z950 Presence of cardiac pacemaker: Secondary | ICD-10-CM | POA: Insufficient documentation

## 2014-07-05 DIAGNOSIS — Z880 Allergy status to penicillin: Secondary | ICD-10-CM | POA: Insufficient documentation

## 2014-07-05 DIAGNOSIS — S0993XA Unspecified injury of face, initial encounter: Secondary | ICD-10-CM | POA: Diagnosis present

## 2014-07-05 DIAGNOSIS — E559 Vitamin D deficiency, unspecified: Secondary | ICD-10-CM | POA: Insufficient documentation

## 2014-07-05 DIAGNOSIS — Z8673 Personal history of transient ischemic attack (TIA), and cerebral infarction without residual deficits: Secondary | ICD-10-CM | POA: Diagnosis not present

## 2014-07-05 DIAGNOSIS — Y9389 Activity, other specified: Secondary | ICD-10-CM | POA: Insufficient documentation

## 2014-07-05 NOTE — ED Provider Notes (Signed)
CSN: [id number]     Arrival date & time 07/05/14  1324 History   First MD Initiated Contact with Patient 07/05/14 1328     Chief Complaint  Patient presents with  . Mouth Injury    jaw dislocation     (Consider location/radiation/quality/duration/timing/severity/associated sxs/prior Treatment) HPI 81 year old female history of CVA and multiple jaw dislocations who presents today with reports that she has been unable to close her mouth since opening at at mealtime. Unable to verbalize to me what has happened. No other signs of trauma are noted.  Past Medical History  Diagnosis Date  . Tachycardia-bradycardia syndrome     s/p [employer] PPM implant in [place]  . Paroxysmal atrial fibrillation     chads2vasc score is at least 6.  She is not a candidate for anticoagulation due to falls.  Her AF burden is low.  . Hypertension   . Frequent falls   . Asthma   . Hyperlipidemia   . Depression   . Anxiety   . Pacemaker   . Stroke   . Osteoporosis 06/23/2013  . Vitamin D deficiency   . Sick sinus syndrome   . Hypothyroidism   . Chronic kidney disease   . Fracture of greater trochanter of left femur 04/02/2013  . Hip fracture, left 04/02/2013  . Pelvic fracture 06/23/2013  . Senile osteoporosis 02/26/2014  . Rhinitis, allergic 02/26/2014  . TMJ (dislocation of temporomandibular joint) 06/16/2014   Past Surgical History  Procedure Laterality Date  . Pacemaker insertion  01/04/2005    [place] PPM 1290 [phone]), GDT [phone] atrial lead and 4457 V lead all implanted in [place]  . Cholecystectomy    . Gallbladder surgery    . Tonsillectomy and adenoidectomy     Family History  Problem Relation Age of Onset  . Diabetes Mother   . High blood pressure Mother   . Heart Problems Father    History  Substance Use Topics  . Smoking status: Never Smoker   . Smokeless tobacco: Never Used  . Alcohol Use: No   OB History    No data available     Review of Systems  Unable to  perform ROS     Allergies  Penicillins  Home Medications   Prior to Admission medications   Medication Sig Start Date End Date Taking? Authorizing Provider  albuterol (PROVENTIL HFA;VENTOLIN HFA) 108 (90 BASE) MCG/ACT inhaler Inhale 2 puffs into the lungs every 6 (six) hours as needed for wheezing or shortness of breath. 06/19/14   Nita Sells, MD  amLODipine (NORVASC) 10 MG tablet Take 10 mg by mouth at bedtime.    Historical Provider, MD  aspirin EC 81 MG tablet Take 81 mg by mouth daily.    Historical Provider, MD  cholecalciferol (VITAMIN D) 1000 UNITS tablet Take 1,000 Units by mouth daily.    Historical Provider, MD  feeding supplement, ENSURE ENLIVE, (ENSURE ENLIVE) LIQD Take 237 mLs by mouth 2 (two) times daily between meals. 06/25/14   Nishant Dhungel, MD  Fluticasone-Salmeterol (ADVAIR) 250-50 MCG/DOSE AEPB Inhale 1 puff into the lungs 2 (two) times daily.    Historical Provider, MD  hydrochlorothiazide (HYDRODIURIL) 25 MG tablet Take 1 tablet (25 mg total) by mouth daily. Patient not taking: Reported on 06/28/2014 07/07/13   Gerlene Fee, NP  isosorbide mononitrate (IMDUR) 30 MG 24 hr tablet Take 30 mg by mouth every morning.    Historical Provider, MD  losartan (COZAAR) 100 MG tablet Take 100  mg by mouth daily.    Historical Provider, MD  metoprolol (LOPRESSOR) 50 MG tablet Take 50 mg by mouth 2 (two) times daily.    Historical Provider, MD  montelukast (SINGULAIR) 10 MG tablet Take 10 mg by mouth at bedtime.    Historical Provider, MD  potassium chloride SA (K-DUR,KLOR-CON) 20 MEQ tablet Take 20 mEq by mouth daily.    Historical Provider, MD  simvastatin (ZOCOR) 40 MG tablet Take 40 mg by mouth daily.    Historical Provider, MD   BP 103/56 mmHg  Pulse 59  Temp(Src) 97.7 F (36.5 C) (Axillary)  Resp 20  Wt 110 lb (49.896 kg)  SpO2 95% Physical Exam  Constitutional: She appears well-developed and well-nourished.  HENT:  Head: Normocephalic and atraumatic.  Right  Ear: External ear normal.  Mouth open consistent with bilateral mandibular dislocation. Bilateral TMJs are palpable the dislocated. Contusion noted left anterior face of her left maxilla  Eyes: Conjunctivae are normal.  Neck: Normal range of motion.  Cardiovascular: Normal rate.   Pulmonary/Chest: Effort normal.  Abdominal: Soft.  Musculoskeletal: Normal range of motion.  Neurological: She is alert.  Skin: Skin is warm and dry.  Vitals reviewed.   ED Course  Reduction of dislocation Date/Time: 07/05/2014 2:47 PM Performed by: Pattricia Boss Authorized by: Pattricia Boss Consent: Verbal consent not obtained. Risks and benefits: risks, benefits and alternatives were discussed Required items: required blood products, implants, devices, and special equipment available Patient identity confirmed: verbally with patient Time out: Immediately prior to procedure a "time out" was called to verify the correct patient, procedure, equipment, support staff and site/side marked as required. Local anesthesia used: no Patient sedated: no Patient tolerance: Patient tolerated the procedure well with no immediate complications Comments: Patient's bilateral T NJ reduced with manipulation with palpable reduction and mouth closing.   (including critical care time) Labs Review Labs Reviewed - No data to display  Imaging Review No results found.   EKG Interpretation None      MDM   Final diagnoses:  TMJ (dislocation of temporomandibular joint), initial encounter       Pattricia Boss, MD 07/05/14 1721

## 2014-07-05 NOTE — Discharge Instructions (Signed)
Jaw Dislocation A jaw dislocation happens when your lower jaw bone separates from your upper jaw bone. This injury is often caused by a very strong force to your jaw. Your doctor will put your lower jaw bone back in place. Any broken jaw bones will be held in place with plates and screws or wiring. Wires may be left in place for 6 to 8 weeks.  HOME CARE   Rest your injured joint. Do not move it.  Put ice on your injured joint for 1 to 2 days or as told by your doctor.  Put ice in a plastic bag.  Place a towel between your skin and the bag.  Leave the ice on for 15 to 20 minutes, every 2 hours while you are awake.  Only take medicines as told by your doctor.  Only eat foods or drink liquids that your doctor tells you to. GET HELP RIGHT AWAY IF:  Your plates, screws, or wires become loose or damaged.  You see fluid or pus around the cuts where your screws, wires, or plates were placed.  Your pain becomes worse, not better. MAKE SURE YOU:  Understand these instructions.  Will watch your condition.  Will get help right away if you are not doing well or get worse. Document Released: 10/20/2010 Document Revised: 05/02/2011 Document Reviewed: 10/20/2010 [facility] Patient Information 2015 [place], [place]. This information is not intended to replace advice given to you by your health care provider. Make sure you discuss any questions you have with your health care provider.

## 2014-07-05 NOTE — ED Notes (Signed)
Per [facility], pt is from [facility] and c/o jaw pain. Pt was eating and jaw dislocated per staff. Pt has been having jaw dislocation issues since April 25th. Pt is a DNR. Pt A&OX2, NAD noted. VSS

## 2014-07-07 ENCOUNTER — Emergency Department (HOSPITAL_COMMUNITY): Payer: Medicare Other

## 2014-07-07 ENCOUNTER — Encounter (HOSPITAL_COMMUNITY): Payer: Self-pay | Admitting: Emergency Medicine

## 2014-07-07 ENCOUNTER — Non-Acute Institutional Stay (SKILLED_NURSING_FACILITY): Payer: Medicare Other | Admitting: Internal Medicine

## 2014-07-07 ENCOUNTER — Emergency Department (HOSPITAL_COMMUNITY)
Admission: EM | Admit: 2014-07-07 | Discharge: 2014-07-07 | Disposition: A | Discharge status: Home / Self Care | Payer: Medicare Other | Attending: Emergency Medicine | Admitting: Emergency Medicine

## 2014-07-07 DIAGNOSIS — I129 Hypertensive chronic kidney disease with stage 1 through stage 4 chronic kidney disease, or unspecified chronic kidney disease: Secondary | ICD-10-CM | POA: Insufficient documentation

## 2014-07-07 DIAGNOSIS — Z7982 Long term (current) use of aspirin: Secondary | ICD-10-CM | POA: Diagnosis not present

## 2014-07-07 DIAGNOSIS — Z8659 Personal history of other mental and behavioral disorders: Secondary | ICD-10-CM | POA: Diagnosis not present

## 2014-07-07 DIAGNOSIS — J45909 Unspecified asthma, uncomplicated: Secondary | ICD-10-CM | POA: Diagnosis not present

## 2014-07-07 DIAGNOSIS — S61205A Unspecified open wound of left ring finger without damage to nail, initial encounter: Secondary | ICD-10-CM | POA: Diagnosis not present

## 2014-07-07 DIAGNOSIS — Z950 Presence of cardiac pacemaker: Secondary | ICD-10-CM | POA: Diagnosis not present

## 2014-07-07 DIAGNOSIS — Z8739 Personal history of other diseases of the musculoskeletal system and connective tissue: Secondary | ICD-10-CM | POA: Insufficient documentation

## 2014-07-07 DIAGNOSIS — S6982XA Other specified injuries of left wrist, hand and finger(s), initial encounter: Secondary | ICD-10-CM

## 2014-07-07 DIAGNOSIS — Y998 Other external cause status: Secondary | ICD-10-CM | POA: Insufficient documentation

## 2014-07-07 DIAGNOSIS — Z79899 Other long term (current) drug therapy: Secondary | ICD-10-CM | POA: Diagnosis not present

## 2014-07-07 DIAGNOSIS — S60445A External constriction of left ring finger, initial encounter: Secondary | ICD-10-CM | POA: Diagnosis not present

## 2014-07-07 DIAGNOSIS — M79645 Pain in left finger(s): Secondary | ICD-10-CM

## 2014-07-07 DIAGNOSIS — E559 Vitamin D deficiency, unspecified: Secondary | ICD-10-CM | POA: Diagnosis not present

## 2014-07-07 DIAGNOSIS — Y9389 Activity, other specified: Secondary | ICD-10-CM | POA: Insufficient documentation

## 2014-07-07 DIAGNOSIS — W4904XA Ring or other jewelry causing external constriction, initial encounter: Secondary | ICD-10-CM | POA: Insufficient documentation

## 2014-07-07 DIAGNOSIS — Z8673 Personal history of transient ischemic attack (TIA), and cerebral infarction without residual deficits: Secondary | ICD-10-CM | POA: Diagnosis not present

## 2014-07-07 DIAGNOSIS — Z7951 Long term (current) use of inhaled steroids: Secondary | ICD-10-CM | POA: Insufficient documentation

## 2014-07-07 DIAGNOSIS — N189 Chronic kidney disease, unspecified: Secondary | ICD-10-CM | POA: Diagnosis not present

## 2014-07-07 DIAGNOSIS — Y9289 Other specified places as the place of occurrence of the external cause: Secondary | ICD-10-CM | POA: Insufficient documentation

## 2014-07-07 DIAGNOSIS — M25442 Effusion, left hand: Secondary | ICD-10-CM

## 2014-07-07 DIAGNOSIS — E785 Hyperlipidemia, unspecified: Secondary | ICD-10-CM | POA: Diagnosis not present

## 2014-07-07 DIAGNOSIS — S6992XA Unspecified injury of left wrist, hand and finger(s), initial encounter: Secondary | ICD-10-CM | POA: Diagnosis present

## 2014-07-07 NOTE — ED Notes (Signed)
Per EMS: pt has a ring stuck on left ring finer, tried top get off by biting and squeezed it on her finger. Pt sent by [facility] to get ring cut off finger. Swelling noted to left hand.

## 2014-07-07 NOTE — ED Notes (Signed)
Patient is alert and oriented x3.  She was given DC instructions and follow up visit instructions.  Patient gave verbal understanding. She was DC ambulatory under her own power to home.  V/S stable.  He was not showing any signs of distress on DC 

## 2014-07-07 NOTE — ED Notes (Signed)
[facility] called for transport.  

## 2014-07-07 NOTE — Progress Notes (Signed)
CSW met with pt at bedside. There was no family present. Patient confirms that she is from [facility]. She states that she has been living at the facility for a year. Per note, pt presents to [facility] due to hand swelling.  Patient states that the staff at the facility assist her with completing her ADL's. She states that she has fallen x3 in the past 6 months.  Patient informed CSW that she has a good support system which includes her sister Lous. She states her sister visits her x3 a week.  Patient stated that she was thirsty. CSW consulted with nurse who says that it is okay to get the pt a drink.  Willette Brace [phone] ED CSW 07/07/2014 9:15 PM

## 2014-07-07 NOTE — ED Notes (Signed)
Bed: [facility] Expected date: 07/07/14 Expected time: 2:30 PM Means of arrival: Ambulance Comments: 81 y/o female finger swelling due to ring stuck on finger

## 2014-07-07 NOTE — ED Provider Notes (Signed)
CSN: [id number]     Arrival date & time 07/07/14  1419 History   First MD Initiated Contact with Patient 07/07/14 1511     Chief Complaint  Patient presents with  . hand swelling       HPI Its reported the patient tried to bite her left ring finger rings off secondary to pain.  This resulted in no dental injury but did result in the ring becoming stuck on the finger.  She now has pain and swelling to her left ring finger.  No other recent injury to her left hand.   Past Medical History  Diagnosis Date  . Tachycardia-bradycardia syndrome     s/p [employer] PPM implant in [place]  . Paroxysmal atrial fibrillation     chads2vasc score is at least 6.  She is not a candidate for anticoagulation due to falls.  Her AF burden is low.  . Hypertension   . Frequent falls   . Asthma   . Hyperlipidemia   . Depression   . Anxiety   . Pacemaker   . Stroke   . Osteoporosis 06/23/2013  . Vitamin D deficiency   . Sick sinus syndrome   . Hypothyroidism   . Chronic kidney disease   . Fracture of greater trochanter of left femur 04/02/2013  . Hip fracture, left 04/02/2013  . Pelvic fracture 06/23/2013  . Senile osteoporosis 02/26/2014  . Rhinitis, allergic 02/26/2014  . TMJ (dislocation of temporomandibular joint) 06/16/2014   Past Surgical History  Procedure Laterality Date  . Pacemaker insertion  01/04/2005    [place] PPM 1290 [phone]), GDT [phone] atrial lead and 4457 V lead all implanted in [place]  . Cholecystectomy    . Gallbladder surgery    . Tonsillectomy and adenoidectomy     Family History  Problem Relation Age of Onset  . Diabetes Mother   . High blood pressure Mother   . Heart Problems Father    History  Substance Use Topics  . Smoking status: Never Smoker   . Smokeless tobacco: Never Used  . Alcohol Use: No   OB History    No data available     Review of Systems  All other systems reviewed and are negative.     Allergies  Penicillins  Home  Medications   Prior to Admission medications   Medication Sig Start Date End Date Taking? Authorizing Provider  albuterol (PROVENTIL HFA;VENTOLIN HFA) 108 (90 BASE) MCG/ACT inhaler Inhale 2 puffs into the lungs every 6 (six) hours as needed for wheezing or shortness of breath. 06/19/14  Yes Nita Sells, MD  amLODipine (NORVASC) 10 MG tablet Take 10 mg by mouth at bedtime.   Yes Historical Provider, MD  aspirin EC 81 MG tablet Take 81 mg by mouth daily.   Yes Historical Provider, MD  cholecalciferol (VITAMIN D) 1000 UNITS tablet Take 1,000 Units by mouth daily.   Yes Historical Provider, MD  feeding supplement, ENSURE ENLIVE, (ENSURE ENLIVE) LIQD Take 237 mLs by mouth 2 (two) times daily between meals. 06/25/14  Yes Nishant Dhungel, MD  Fluticasone-Salmeterol (ADVAIR) 250-50 MCG/DOSE AEPB Inhale 1 puff into the lungs 2 (two) times daily.   Yes Historical Provider, MD  hydrochlorothiazide (HYDRODIURIL) 25 MG tablet Take 1 tablet (25 mg total) by mouth daily. 07/07/13  Yes Gerlene Fee, NP  isosorbide mononitrate (IMDUR) 30 MG 24 hr tablet Take 30 mg by mouth every morning.   Yes Historical Provider, MD  losartan (COZAAR) 100 MG  tablet Take 100 mg by mouth daily.   Yes Historical Provider, MD  metoprolol (LOPRESSOR) 50 MG tablet Take 50 mg by mouth 2 (two) times daily.   Yes Historical Provider, MD  montelukast (SINGULAIR) 10 MG tablet Take 10 mg by mouth at bedtime.   Yes Historical Provider, MD  potassium chloride SA (K-DUR,KLOR-CON) 20 MEQ tablet Take 20 mEq by mouth daily.   Yes Historical Provider, MD  simvastatin (ZOCOR) 40 MG tablet Take 40 mg by mouth daily.   Yes Historical Provider, MD  ipratropium-albuterol (DUONEB) 0.5-2.5 (3) MG/3ML SOLN Inhale 3 mLs into the lungs every 8 (eight) hours as needed. Shortness of breath and wheezing 06/27/14   Historical Provider, MD   BP 133/62 mmHg  Pulse 60  Temp(Src) 97.6 F (36.4 C) (Oral)  Resp 18  SpO2 96% Physical Exam  Constitutional:  She is oriented to person, place, and time. She appears well-developed and well-nourished.  HENT:  Head: Normocephalic.  Eyes: EOM are normal.  Neck: Normal range of motion.  Pulmonary/Chest: Effort normal.  Abdominal: She exhibits no distension.  Musculoskeletal: Normal range of motion.  Swelling of the left ring finger especially around the left ring finger PIP joint.  No significant erythema.  Rings removed 2 with ring cutter.  Underlying skin with small abrasion.  Neurological: She is alert and oriented to person, place, and time.  Psychiatric: She has a normal mood and affect.  Nursing note and vitals reviewed.   ED Course  FOREIGN BODY REMOVAL #1 Performed by: Jola Schmidt Authorized by: Jola Schmidt Consent: Verbal consent obtained. Risks and benefits: risks, benefits and alternatives were discussed Consent given by: patient Required items: required blood products, implants, devices, and special equipment available Body area: skin General location: upper extremity Location details: left ring finger Removal mechanism: ring cutter. Complexity: simple 1 objects recovered. Objects recovered: ring Post-procedure assessment: foreign body removed Patient tolerance: Patient tolerated the procedure well with no immediate complications    FOREIGN BODY REMOVAL #2 Performed by: Jola Schmidt Authorized by: Jola Schmidt Consent: Verbal consent obtained. Risks and benefits: risks, benefits and alternatives were discussed Consent given by: patient Required items: required blood products, implants, devices, and special equipment available Body area: skin General location: upper extremity Location details: left ring finger Removal mechanism: ring cutter. Complexity: simple 1 objects recovered. Objects recovered: ring Post-procedure assessment: foreign body removed Patient tolerance: Patient tolerated the procedure well with no immediate complications      Labs  Reviewed - No data to display  Imaging Review Dg Finger Ring Left  07/07/2014   CLINICAL DATA:  81 year old female with a history of a ring removal.  EXAM: LEFT RING FINGER 2+V  COMPARISON:  None.  FINDINGS: Diffuse osteopenia. Osteopenia limits detection of nondisplaced fractures.  No acute fracture.  Chronic deformity at the base of the proximal phalanx fourth digit of left hand, with approximately 17 degree angulation. No comparison available.  Osteoarthritis at all of the visualized interphalangeal joints.  Mild soft tissue swelling is present at the proximal interphalangeal joint of the fourth digit. No radiopaque foreign body.  IMPRESSION: No displaced fracture. Mild soft tissue swelling at the proximal interphalangeal joint of the fourth digit.  Mild angulation of the proximal phalanx fourth digit, compatible with chronic deformity.  Changes of osteoarthritis.  Signed,  Dulcy Fanny. Earleen Newport, DO  [facility]  [facility]   Electronically Signed   By: Corrie Mckusick D.O.   On: 07/07/2014 17:18  EKG Interpretation None      MDM   Final diagnoses:  Pain in finger of left hand  Ring avulsion injury of finger, left, initial encounter    Two rings removed from left finger. Xray negative    Jola Schmidt, MD 07/07/14 [phone]

## 2014-07-07 NOTE — ED Notes (Signed)
Pt two gold rings cut off of her finger, colored stone in each center. One ring has three stones missing, one fell out when procedure was being carried out. Rings placed in blue sterile cup.

## 2014-07-07 NOTE — Progress Notes (Signed)
Patient ID: Yvonne Buck, female   DOB: January 08, 1930, 81 y.o.   MRN: [id number]    Facility: [facility]  Chief Complaint  Patient presents with  . Acute Visit    Code status: DNR  Allergies reviewed- PCN  HPI:   81 y/o female patient is seen today for acute concern. Staff have noticed her ring finger in left hand to be swollen and red. She complaints of pain, does not recall how it happened and is unable to provide history.   Review of Systems:  Constitutional: Negative for fever, chills, diaphoresis.   Respiratory: Negative for cough, shortness of breath and wheezing.    Cardiovascular: Negative for chest pain, palpitations Musculoskeletal: Negative for witnessed falls/ trauma Skin: Negative for itching and rash.    Past medical history reviewed  Medication reviewed. See [facility]  Physical exam BP 124/60 mmHg  Pulse 60  Temp(Src) 98 F (36.7 C)  Resp 16  Constitutional: frail and in no acute distress.   Cardiovascular: Normal rate, regular rhythm and intact distal pulses.    Respiratory: Effort normal and breath sounds normal. No respiratory distress. She has no wheezes.   Musculoskeletal: able to move all 4 extremities, left 4th digit PIP red, swollen, tender to touch with 2 rings on the finger appears tight and unable to remove, capillary refill is good Neurological: She is alert, oriented to person  Psychiatric: poor insight and judgement   Assessment/plan  1. Swelling of finger joint, left With tenderness and edema. Will need xray of the finger to rule out fracture given her dementia and limited history taking. Sending to the ED for xray to rule out fracture.  2. External constriction of left ring finger, initial encounter With the two rings in there. Will need the rings to be cut and removed to avoid compromising of circulation. Send to ED for this.   Reviewed care plan with patient and nursing supervisor

## 2014-08-07 ENCOUNTER — Encounter: Payer: Self-pay | Admitting: Internal Medicine

## 2014-08-07 ENCOUNTER — Non-Acute Institutional Stay (SKILLED_NURSING_FACILITY): Payer: Medicare Other | Admitting: Internal Medicine

## 2014-08-07 DIAGNOSIS — I10 Essential (primary) hypertension: Secondary | ICD-10-CM | POA: Diagnosis not present

## 2014-08-07 DIAGNOSIS — R296 Repeated falls: Secondary | ICD-10-CM

## 2014-08-07 DIAGNOSIS — E785 Hyperlipidemia, unspecified: Secondary | ICD-10-CM | POA: Diagnosis not present

## 2014-08-07 NOTE — Progress Notes (Signed)
Patient ID: Yvonne Buck, female   DOB: 07/18/1929, 81 y.o.   MRN: [id number]   [facility]  Code Status: DNR  Chief Complaint  Patient presents with  . Medical Management of Chronic Issues    Routine Visit     Allergies  Allergen Reactions  . Penicillins Other (See Comments)    Red spots ### Tolerated Rocephin 12/2012 ###    HPI:   81 y/o female patient is seen today for routine visit. She is sitting on a geri chair, pleasantly confused, in no distress. Had a fall beginning of this month and had hurt her left hand. Xray showed no acute fracture. No new skin concerns. No other concern from staff. She is now supposed to be on jaw strap for jaw support.   Review of Systems:  Constitutional: Negative for fever, chills, diaphoresis.   Respiratory: Negative for cough, shortness of breath and wheezing.    Cardiovascular: Negative for chest pain, palpitations Skin: Negative for itching and rash.    Past medical history reviewed  Medication reviewed. See [facility]   Medication List       This list is accurate as of: 08/07/14  5:45 PM.  Always use your most recent med list.               albuterol 108 (90 BASE) MCG/ACT inhaler  Commonly known as:  PROVENTIL HFA;VENTOLIN HFA  Inhale 2 puffs into the lungs every 6 (six) hours as needed for wheezing or shortness of breath.     amLODipine 10 MG tablet  Commonly known as:  NORVASC  Take 10 mg by mouth at bedtime.     aspirin EC 81 MG tablet  Take 81 mg by mouth daily.     cholecalciferol 1000 UNITS tablet  Commonly known as:  VITAMIN D  Take 1,000 Units by mouth daily.     Fluticasone-Salmeterol 250-50 MCG/DOSE Aepb  Commonly known as:  ADVAIR  Inhale 1 puff into the lungs 2 (two) times daily.     ipratropium-albuterol 0.5-2.5 (3) MG/3ML Soln  Commonly known as:  DUONEB  Inhale 3 mLs into the lungs every 8 (eight) hours as needed. Shortness of breath and wheezing     isosorbide mononitrate 30 MG 24 hr  tablet  Commonly known as:  IMDUR  Take 30 mg by mouth every morning.     losartan 100 MG tablet  Commonly known as:  COZAAR  Take 100 mg by mouth daily.     metoprolol 50 MG tablet  Commonly known as:  LOPRESSOR  Take 50 mg by mouth 2 (two) times daily.     montelukast 10 MG tablet  Commonly known as:  SINGULAIR  Take 10 mg by mouth at bedtime.     potassium chloride SA 20 MEQ tablet  Commonly known as:  K-DUR,KLOR-CON  Take 20 mEq by mouth daily.     simvastatin 40 MG tablet  Commonly known as:  ZOCOR  Take 40 mg by mouth daily.        Physical exam BP 166/81 mmHg  Pulse 59  Temp(Src) 98 F (36.7 C) (Oral)  Resp 16  Ht 5\' 5"  (1.651 m)  Wt 112 lb (50.803 kg)  BMI 18.64 kg/m2  SpO2 98%  Constitutional: frail and in no acute distress.   Cardiovascular: Normal rate, regular rhythm and intact distal pulses.    Respiratory: Effort normal and breath sounds normal. No respiratory distress. She has no wheezes.   Musculoskeletal: able  to move all 4 extremities, on geri chair Neurological: She is alert, oriented to person  Psychiatric: poor insight and judgement   Assessment/plan  HTN Elevated sbp today, on amlodipine 10 mg daily, losartan 50 mg daily, imdur 30 mg daily and cozaar 100 mg daily. Monitor bp q shift for now and consider adjusting bp med if bp persistenly > 150/90, avoid tight control with her being a high fall risk patient  Frequent falls High risk with her dementia. Fall precautions, vit d supplement  Hyperlipidemia Continue current regimen zocor  Blanchie Serve, MD  [facility] [phone] (Monday-Friday 8 am - 5 pm) [phone] (afterhours)

## 2014-08-27 ENCOUNTER — Emergency Department (HOSPITAL_COMMUNITY)
Admission: EM | Admit: 2014-08-27 | Discharge: 2014-08-27 | Disposition: A | Discharge status: Home / Self Care | Payer: Medicare Other | Attending: Emergency Medicine | Admitting: Emergency Medicine

## 2014-08-27 ENCOUNTER — Encounter (HOSPITAL_COMMUNITY): Payer: Self-pay | Admitting: *Deleted

## 2014-08-27 ENCOUNTER — Emergency Department (HOSPITAL_COMMUNITY): Payer: Medicare Other

## 2014-08-27 DIAGNOSIS — Z79899 Other long term (current) drug therapy: Secondary | ICD-10-CM | POA: Insufficient documentation

## 2014-08-27 DIAGNOSIS — I480 Paroxysmal atrial fibrillation: Secondary | ICD-10-CM | POA: Diagnosis not present

## 2014-08-27 DIAGNOSIS — E559 Vitamin D deficiency, unspecified: Secondary | ICD-10-CM | POA: Insufficient documentation

## 2014-08-27 DIAGNOSIS — Z7982 Long term (current) use of aspirin: Secondary | ICD-10-CM | POA: Diagnosis not present

## 2014-08-27 DIAGNOSIS — Z950 Presence of cardiac pacemaker: Secondary | ICD-10-CM | POA: Diagnosis not present

## 2014-08-27 DIAGNOSIS — Z8673 Personal history of transient ischemic attack (TIA), and cerebral infarction without residual deficits: Secondary | ICD-10-CM | POA: Diagnosis not present

## 2014-08-27 DIAGNOSIS — I129 Hypertensive chronic kidney disease with stage 1 through stage 4 chronic kidney disease, or unspecified chronic kidney disease: Secondary | ICD-10-CM | POA: Diagnosis not present

## 2014-08-27 DIAGNOSIS — Y999 Unspecified external cause status: Secondary | ICD-10-CM | POA: Diagnosis not present

## 2014-08-27 DIAGNOSIS — N189 Chronic kidney disease, unspecified: Secondary | ICD-10-CM | POA: Insufficient documentation

## 2014-08-27 DIAGNOSIS — S030XXA Dislocation of jaw, initial encounter: Secondary | ICD-10-CM | POA: Insufficient documentation

## 2014-08-27 DIAGNOSIS — E785 Hyperlipidemia, unspecified: Secondary | ICD-10-CM | POA: Insufficient documentation

## 2014-08-27 DIAGNOSIS — F329 Major depressive disorder, single episode, unspecified: Secondary | ICD-10-CM | POA: Insufficient documentation

## 2014-08-27 DIAGNOSIS — E039 Hypothyroidism, unspecified: Secondary | ICD-10-CM | POA: Diagnosis not present

## 2014-08-27 DIAGNOSIS — Y939 Activity, unspecified: Secondary | ICD-10-CM | POA: Insufficient documentation

## 2014-08-27 DIAGNOSIS — M810 Age-related osteoporosis without current pathological fracture: Secondary | ICD-10-CM | POA: Diagnosis not present

## 2014-08-27 DIAGNOSIS — X58XXXA Exposure to other specified factors, initial encounter: Secondary | ICD-10-CM | POA: Insufficient documentation

## 2014-08-27 DIAGNOSIS — Y929 Unspecified place or not applicable: Secondary | ICD-10-CM | POA: Diagnosis not present

## 2014-08-27 DIAGNOSIS — F419 Anxiety disorder, unspecified: Secondary | ICD-10-CM | POA: Insufficient documentation

## 2014-08-27 DIAGNOSIS — S0300XA Dislocation of jaw, unspecified side, initial encounter: Secondary | ICD-10-CM

## 2014-08-27 DIAGNOSIS — J45909 Unspecified asthma, uncomplicated: Secondary | ICD-10-CM | POA: Insufficient documentation

## 2014-08-27 MED ORDER — MORPHINE SULFATE 4 MG/ML IJ SOLN
4.0000 mg | Freq: Once | INTRAMUSCULAR | Status: AC
  Administered 2014-08-27: 4 mg via INTRAVENOUS
  Filled 2014-08-27: qty 1

## 2014-08-27 NOTE — ED Notes (Signed)
DNR YELLOW COPY GIVEN TO PTAR

## 2014-08-27 NOTE — ED Notes (Signed)
Bed: YB01 Expected date:  Expected time:  Means of arrival:  Comments: EMS- elderly, dislocated jawbone

## 2014-08-27 NOTE — ED Notes (Signed)
Patient transported to X-ray 

## 2014-08-27 NOTE — ED Provider Notes (Signed)
CSN: [id number]     Arrival date & time 08/27/14  1105 History   First MD Initiated Contact with Patient 08/27/14 1115     Chief Complaint  Patient presents with  . Dislocated Jaw     HPI Patient presents to the emergency room for inability to close her mouth properly. Patient has a history of TMJ and recurrent mandibular dislocations. Staff noticed when they were checking on the patient today that she held her mouth open and was not able to close it properly. The patient denies any recent injuries. Patient does not room or when this exactly occurred today. Patient remembers seeing me approximately one year ago for the same problem. Past Medical History  Diagnosis Date  . Tachycardia-bradycardia syndrome     s/p [employer] PPM implant in [place]  . Paroxysmal atrial fibrillation     chads2vasc score is at least 6.  She is not a candidate for anticoagulation due to falls.  Her AF burden is low.  . Hypertension   . Frequent falls   . Asthma   . Hyperlipidemia   . Depression   . Anxiety   . Pacemaker   . Stroke   . Osteoporosis 06/23/2013  . Vitamin D deficiency   . Sick sinus syndrome   . Hypothyroidism   . Chronic kidney disease   . Fracture of greater trochanter of left femur 04/02/2013  . Hip fracture, left 04/02/2013  . Pelvic fracture 06/23/2013  . Senile osteoporosis 02/26/2014  . Rhinitis, allergic 02/26/2014  . TMJ (dislocation of temporomandibular joint) 06/16/2014   Past Surgical History  Procedure Laterality Date  . Pacemaker insertion  01/04/2005    [place] PPM 1290 [phone]), GDT [phone] atrial lead and 4457 V lead all implanted in [place]  . Cholecystectomy    . Gallbladder surgery    . Tonsillectomy and adenoidectomy     Family History  Problem Relation Age of Onset  . Diabetes Mother   . High blood pressure Mother   . Heart Problems Father    History  Substance Use Topics  . Smoking status: Never Smoker   . Smokeless tobacco: Never Used  . Alcohol  Use: No   OB History    No data available     Review of Systems  All other systems reviewed and are negative.     Allergies  Penicillins  Home Medications   Prior to Admission medications   Medication Sig Start Date End Date Taking? Authorizing Provider  albuterol (PROVENTIL HFA;VENTOLIN HFA) 108 (90 BASE) MCG/ACT inhaler Inhale 2 puffs into the lungs every 6 (six) hours as needed for wheezing or shortness of breath. 06/19/14  Yes Nita Sells, MD  amLODipine (NORVASC) 5 MG tablet Take 5 mg by mouth daily. 08/21/14  Yes Historical Provider, MD  aspirin EC 81 MG tablet Take 81 mg by mouth daily.   Yes Historical Provider, MD  cholecalciferol (VITAMIN D) 1000 UNITS tablet Take 1,000 Units by mouth daily.   Yes Historical Provider, MD  Fluticasone-Salmeterol (ADVAIR) 250-50 MCG/DOSE AEPB Inhale 1 puff into the lungs 2 (two) times daily.   Yes Historical Provider, MD  ipratropium-albuterol (DUONEB) 0.5-2.5 (3) MG/3ML SOLN Inhale 3 mLs into the lungs every 8 (eight) hours as needed. Shortness of breath and wheezing 06/27/14  Yes Historical Provider, MD  isosorbide mononitrate (IMDUR) 30 MG 24 hr tablet Take 30 mg by mouth every morning.   Yes Historical Provider, MD  losartan (COZAAR) 100 MG tablet Take  100 mg by mouth daily.   Yes Historical Provider, MD  metoprolol (LOPRESSOR) 50 MG tablet Take 50 mg by mouth 2 (two) times daily.   Yes Historical Provider, MD  montelukast (SINGULAIR) 10 MG tablet Take 10 mg by mouth at bedtime.   Yes Historical Provider, MD  potassium chloride SA (K-DUR,KLOR-CON) 20 MEQ tablet Take 20 mEq by mouth daily.   Yes Historical Provider, MD  simvastatin (ZOCOR) 40 MG tablet Take 40 mg by mouth daily.   Yes Historical Provider, MD   BP 118/51 mmHg  Pulse 59  Temp(Src) 97.2 F (36.2 C) (Axillary)  Resp 16  SpO2 95% Physical Exam  Constitutional: She appears well-developed and well-nourished. No distress.  Elderly, frail  HENT:  Head: Normocephalic  and atraumatic.  Right Ear: External ear normal.  Left Ear: External ear normal.  Patient's mouth is held open, mandible appears to be protruding anteriorly, unable to close mouth completely  Eyes: Conjunctivae are normal. Right eye exhibits no discharge. Left eye exhibits no discharge. No scleral icterus.  Neck: Neck supple. No tracheal deviation present.  Cardiovascular: Normal rate.   Pulmonary/Chest: Effort normal. No stridor. No respiratory distress.  Musculoskeletal: She exhibits no edema.  Neurological: She is alert. Cranial nerve deficit: no gross deficits.  Skin: Skin is warm and dry. No rash noted.  Psychiatric: She has a normal mood and affect.  Nursing note and vitals reviewed.   ED Course  Reduction of dislocation Date/Time: 08/27/2014 12:00 PM Performed by: Dorie Rank Authorized by: Dorie Rank Consent: Verbal consent obtained. Written consent not obtained. Risks and benefits: risks, benefits and alternatives were discussed Consent given by: patient Local anesthesia used: no Patient sedated: no Patient tolerance: Patient tolerated the procedure well with no immediate complications Comments: Closed reduction of mandibular dislocation, pt reduced than would easily dislocate as she opened her mouth slightly.  Able to reduce her jaw with good dental occlusion.  Mandible seated posteriorly compared to initial presentation.  Xrays pending   (including critical care time) Labs Review Labs Reviewed - No data to display  Imaging Review Dg Mandible 1-3 Views  08/27/2014   CLINICAL DATA:  Chronic mandibular dislocation post reduction  EXAM: MANDIBLE - 1-3 VIEW  COMPARISON:  Facial bone CT 06/20/2014  FINDINGS: Osseous mineralization normal.  Temporomandibular joint alignments grossly normal.  No definite fracture, dislocation or bone destruction.  Visualized paranasal sinuses clear.  IMPRESSION: No acute mandibular abnormalities identified.   Electronically Signed   By: Lavonia Dana  M.D.   On: 08/27/2014 13:33      MDM   Final diagnoses:  Dislocated mandible, initial encounter    Jaw successfully reduced.  Soft diet.  Avoid opening mouth widely.  Follow up with PCP    Dorie Rank, MD 08/27/14 1359

## 2014-08-27 NOTE — ED Notes (Signed)
MD at bedside. EDP KNAPP PRESENT 

## 2014-08-27 NOTE — ED Notes (Signed)
EDP KNAPP REDUCED JAW BACK INTO PLACED. PT TOLERATED. WRAPPED FACE TO SECURE JAW PER EDP KNAPP VERBAL ORDER. EDP KNAPP WITNESSED WRAP AND AGREED WITH APPLICATION. PT TOLERATED

## 2014-08-27 NOTE — ED Notes (Addendum)
Per ems pt is from [facility], hx of CVA, does not communicate/talk well. Pt has TMJ, hx of multiple jaw dislocations. Staff noticed pt unable to close jaw since this morning.  DNR sheet in packet of paperwork.

## 2014-08-27 NOTE — Discharge Instructions (Signed)
Jaw Dislocation A jaw dislocation is the displacement of the joint where the upper jaw bone (maxilla) and the lower jaw bone (mandibula) meet (temporomandibular joint). Soon after the dislocation, the jaw muscles tighten. This prevents the mouth from closing normally.  CAUSES A jaw dislocation usually is caused by a sudden forceful impact to the jaw. A strong punch in the jaw during a fist fight or a sports injury are examples of causes of jaw dislocation. Another cause is injury due to car or motorcycle accidents. RISK FACTORS Although anyone can have a jaw dislocation, some people are more at risk than others. People at increased risk for jaw dislocation include participants in contact sports. SYMPTOMS Symptoms of jaw dislocation can vary, depending on the severity of the dislocation. They can include:  Feeling that your teeth are out of alignment when you bite.  Inability to close your mouth completely.  Drooling.  Extreme pain, with the inability to move your jaw. DIAGNOSIS Your caregiver will feel your temporomandibular joints and ask you to move your jaw. Your caregiver also will feel the inside of your mouth to make sure there are no fractures or cuts (lacerations). TREATMENT Your caregiver will manipulate your jaw to put it back into place (reduction). If you have any jaw fractures from the dislocation, they usually will be held in place with plates and screws or with wiring.  HOME CARE INSTRUCTIONS The following measures can help to reduce pain and hasten the healing process:  Rest your injured joint. Do not move it. Avoid activities similar to the one that caused your injury.  Apply ice to your injured joint for 1 to 2 days after your reduction or as directed by your caregiver. Applying ice helps to reduce inflammation and pain.  Put ice in a plastic bag.  Place a towel between your skin and the bag.  Leave the ice on for 15-20 minutes at a time, 03-04 times a day.  Take  over-the-counter or prescription medication for pain as directed by your caregiver. Also, your caregiver may instruct you to only have certain foods until your jaw heals. These foods may be soft or liquified so that your jaw does not have to move much to eat them. SEEK IMMEDIATE MEDICAL CARE IF:  You have plates and screws or wiring to hold your jaw together that becomes loose or damaged.  You develop drainage from any of the cuts (incisions) where your wires or plates and screws were placed.  Your pain becomes worse rather than better. MAKE SURE YOU:  Understand these instructions.  Will watch your condition.  Will get help right away if you are not doing well or get worse. Document Released: 02/05/2000 Document Revised: 05/02/2011 Document Reviewed: 07/08/2010 [facility] Patient Information 2015 [place], [place]. This information is not intended to replace advice given to you by your health care provider. Make sure you discuss any questions you have with your health care provider.

## 2014-08-27 NOTE — ED Notes (Signed)
MD at bedside. EDP KNAPP PRESENT AT BS ATTEMPTING TO MANIPULATE JAW BACK INTO PLACE. NOT SUCCESSFUL. MEDICATIONS ORDERED

## 2014-09-10 ENCOUNTER — Encounter (HOSPITAL_COMMUNITY): Payer: Self-pay

## 2014-09-10 ENCOUNTER — Emergency Department (HOSPITAL_COMMUNITY)
Admission: EM | Admit: 2014-09-10 | Discharge: 2014-09-10 | Disposition: A | Discharge status: Home / Self Care | Payer: Medicare Other | Attending: Emergency Medicine | Admitting: Emergency Medicine

## 2014-09-10 DIAGNOSIS — S0300XA Dislocation of jaw, unspecified side, initial encounter: Secondary | ICD-10-CM

## 2014-09-10 DIAGNOSIS — Z950 Presence of cardiac pacemaker: Secondary | ICD-10-CM | POA: Insufficient documentation

## 2014-09-10 DIAGNOSIS — Z7982 Long term (current) use of aspirin: Secondary | ICD-10-CM | POA: Insufficient documentation

## 2014-09-10 DIAGNOSIS — Y9389 Activity, other specified: Secondary | ICD-10-CM | POA: Diagnosis not present

## 2014-09-10 DIAGNOSIS — Z8673 Personal history of transient ischemic attack (TIA), and cerebral infarction without residual deficits: Secondary | ICD-10-CM | POA: Insufficient documentation

## 2014-09-10 DIAGNOSIS — I129 Hypertensive chronic kidney disease with stage 1 through stage 4 chronic kidney disease, or unspecified chronic kidney disease: Secondary | ICD-10-CM | POA: Diagnosis not present

## 2014-09-10 DIAGNOSIS — E559 Vitamin D deficiency, unspecified: Secondary | ICD-10-CM | POA: Diagnosis not present

## 2014-09-10 DIAGNOSIS — Z880 Allergy status to penicillin: Secondary | ICD-10-CM | POA: Diagnosis not present

## 2014-09-10 DIAGNOSIS — Z7951 Long term (current) use of inhaled steroids: Secondary | ICD-10-CM | POA: Insufficient documentation

## 2014-09-10 DIAGNOSIS — I480 Paroxysmal atrial fibrillation: Secondary | ICD-10-CM | POA: Diagnosis not present

## 2014-09-10 DIAGNOSIS — X58XXXA Exposure to other specified factors, initial encounter: Secondary | ICD-10-CM | POA: Diagnosis not present

## 2014-09-10 DIAGNOSIS — E785 Hyperlipidemia, unspecified: Secondary | ICD-10-CM | POA: Diagnosis not present

## 2014-09-10 DIAGNOSIS — N189 Chronic kidney disease, unspecified: Secondary | ICD-10-CM | POA: Diagnosis not present

## 2014-09-10 DIAGNOSIS — Y9289 Other specified places as the place of occurrence of the external cause: Secondary | ICD-10-CM | POA: Diagnosis not present

## 2014-09-10 DIAGNOSIS — J45909 Unspecified asthma, uncomplicated: Secondary | ICD-10-CM | POA: Insufficient documentation

## 2014-09-10 DIAGNOSIS — M810 Age-related osteoporosis without current pathological fracture: Secondary | ICD-10-CM | POA: Insufficient documentation

## 2014-09-10 DIAGNOSIS — S030XXA Dislocation of jaw, initial encounter: Secondary | ICD-10-CM | POA: Diagnosis present

## 2014-09-10 DIAGNOSIS — Z8659 Personal history of other mental and behavioral disorders: Secondary | ICD-10-CM | POA: Insufficient documentation

## 2014-09-10 DIAGNOSIS — Z79899 Other long term (current) drug therapy: Secondary | ICD-10-CM | POA: Insufficient documentation

## 2014-09-10 DIAGNOSIS — Y998 Other external cause status: Secondary | ICD-10-CM | POA: Insufficient documentation

## 2014-09-10 MED ORDER — DIAZEPAM 5 MG/ML IJ SOLN
5.0000 mg | Freq: Once | INTRAMUSCULAR | Status: AC
  Administered 2014-09-10: 5 mg via INTRAMUSCULAR
  Filled 2014-09-10: qty 2

## 2014-09-10 MED ORDER — PROPOFOL 10 MG/ML IV BOLUS
0.5000 mg/kg | Freq: Once | INTRAVENOUS | Status: DC
Start: 2014-09-10 — End: 2014-09-10

## 2014-09-10 MED ORDER — PROPOFOL 10 MG/ML IV BOLUS
0.5000 mg/kg | Freq: Once | INTRAVENOUS | Status: AC
  Administered 2014-09-10: 20 mg via INTRAVENOUS
  Filled 2014-09-10: qty 1

## 2014-09-10 MED ORDER — FENTANYL CITRATE (PF) 100 MCG/2ML IJ SOLN
50.0000 ug | Freq: Once | INTRAMUSCULAR | Status: AC
  Administered 2014-09-10: 50 ug via INTRAMUSCULAR
  Filled 2014-09-10: qty 2

## 2014-09-10 NOTE — ED Notes (Signed)
Diprivan given at 1313. Could not change time once entered. Diprivan given for sedation procedure.

## 2014-09-10 NOTE — ED Notes (Signed)
[facility] called for transportation back to [facility] 

## 2014-09-10 NOTE — ED Provider Notes (Addendum)
CSN: [id number]     Arrival date & time 09/10/14  0927 History   First MD Initiated Contact with Patient 09/10/14 [phone]     Chief Complaint  Patient presents with  . dislocated jaw      (Consider location/radiation/quality/duration/timing/severity/associated sxs/prior Treatment) HPI   84yF with dislocated mandible. Unclear of what exactly precipitated this today, but she has a hx of the same. Unclear when exact onset, but pt reports today. Unable to close mouth and speak, but shaking head yes/no.   Past Medical History  Diagnosis Date  . Tachycardia-bradycardia syndrome     s/p [employer] PPM implant in [place]  . Paroxysmal atrial fibrillation     chads2vasc score is at least 6.  She is not a candidate for anticoagulation due to falls.  Her AF burden is low.  . Hypertension   . Frequent falls   . Asthma   . Hyperlipidemia   . Depression   . Anxiety   . Pacemaker   . Stroke   . Osteoporosis 06/23/2013  . Vitamin D deficiency   . Sick sinus syndrome   . Hypothyroidism   . Chronic kidney disease   . Fracture of greater trochanter of left femur 04/02/2013  . Hip fracture, left 04/02/2013  . Pelvic fracture 06/23/2013  . Senile osteoporosis 02/26/2014  . Rhinitis, allergic 02/26/2014  . TMJ (dislocation of temporomandibular joint) 06/16/2014   Past Surgical History  Procedure Laterality Date  . Pacemaker insertion  01/04/2005    [place] PPM 1290 [phone]), GDT [phone] atrial lead and 4457 V lead all implanted in [place]  . Cholecystectomy    . Gallbladder surgery    . Tonsillectomy and adenoidectomy     Family History  Problem Relation Age of Onset  . Diabetes Mother   . High blood pressure Mother   . Heart Problems Father    History  Substance Use Topics  . Smoking status: Never Smoker   . Smokeless tobacco: Never Used  . Alcohol Use: No   OB History    No data available     Review of Systems  All systems reviewed and negative, other than as noted in  HPI.   Allergies  Penicillins  Home Medications   Prior to Admission medications   Medication Sig Start Date End Date Taking? Authorizing Provider  albuterol (PROVENTIL HFA;VENTOLIN HFA) 108 (90 BASE) MCG/ACT inhaler Inhale 2 puffs into the lungs every 6 (six) hours as needed for wheezing or shortness of breath. 06/19/14  Yes Nita Sells, MD  amLODipine (NORVASC) 5 MG tablet Take 5 mg by mouth daily. 08/21/14  Yes Historical Provider, MD  aspirin EC 81 MG tablet Take 81 mg by mouth daily.   Yes Historical Provider, MD  cholecalciferol (VITAMIN D) 1000 UNITS tablet Take 1,000 Units by mouth daily.   Yes Historical Provider, MD  Fluticasone-Salmeterol (ADVAIR) 250-50 MCG/DOSE AEPB Inhale 1 puff into the lungs 2 (two) times daily.   Yes Historical Provider, MD  ipratropium-albuterol (DUONEB) 0.5-2.5 (3) MG/3ML SOLN Inhale 3 mLs into the lungs every 8 (eight) hours as needed. Shortness of breath and wheezing 06/27/14  Yes Historical Provider, MD  isosorbide mononitrate (IMDUR) 30 MG 24 hr tablet Take 30 mg by mouth every morning.   Yes Historical Provider, MD  losartan (COZAAR) 100 MG tablet Take 100 mg by mouth daily.   Yes Historical Provider, MD  metoprolol (LOPRESSOR) 50 MG tablet Take 50 mg by mouth 2 (two) times daily.  Yes Historical Provider, MD  montelukast (SINGULAIR) 10 MG tablet Take 10 mg by mouth at bedtime.   Yes Historical Provider, MD  potassium chloride SA (K-DUR,KLOR-CON) 20 MEQ tablet Take 20 mEq by mouth daily.   Yes Historical Provider, MD  simvastatin (ZOCOR) 40 MG tablet Take 40 mg by mouth daily.   Yes Historical Provider, MD   BP 108/73 mmHg  Pulse 59  Temp(Src) 97.8 F (36.6 C) (Oral)  Resp 20  SpO2 95% Physical Exam  Constitutional: She appears well-developed and well-nourished. No distress.  HENT:  Head: Normocephalic and atraumatic.  Mouth held open. Reports she cannot close. Mandible protruding anteriorly. Does not seem to be deviating to either side.    Eyes: Conjunctivae are normal. Right eye exhibits no discharge. Left eye exhibits no discharge.  Neck: Neck supple.  Cardiovascular: Normal rate, regular rhythm and normal heart sounds.  Exam reveals no gallop and no friction rub.   No murmur heard. Pulmonary/Chest: Effort normal and breath sounds normal. No respiratory distress.  Abdominal: Soft. She exhibits no distension. There is no tenderness.  Musculoskeletal: She exhibits no edema or tenderness.  Neurological: She is alert.  Skin: Skin is warm and dry.  Psychiatric: She has a normal mood and affect. Her behavior is normal. Thought content normal.  Nursing note and vitals reviewed.   ED Course  Reduction of dislocation Date/Time: 09/10/2014 1:15 PM Performed by: Virgel Manifold Authorized by: Virgel Manifold Consent: Verbal consent obtained. Consent given by: patient Patient identity confirmed: verbally with patient, provided demographic data and arm band Time out: Immediately prior to procedure a "time out" was called to verify the correct patient, procedure, equipment, support staff and site/side marked as required. Local anesthesia used: no Patient sedated: yes Sedation type: moderate (conscious) sedation Analgesia: fentanyl Vitals: Vital signs were monitored during sedation. Patient tolerance: Patient tolerated the procedure well with no immediate complications Comments: Bilateral mandible dislocation. Reduced easily and w/o apparent complication.    Procedural Sedation: For Mandible (Jaw) Reduction  Preprocedure  Pre-anesthesia/induction confirmation of laterality/correct procedure site including "time-out."  Provider confirms review of the nurses' note, allergies, medications, pertinent labs, PMH, pre-induction vital signs, pulse oximetry, pain level, and ECG (as applicable), and patient condition satisfactory for commencing with order for sedation and procedure.  Medications:  Propofol: 20 mg IV  Patient tolerated  procedure and procedural sedation component as expected without apparent immediate complications.  Physician confirms procedural medication orders as administered, patient was assessed by physician post-procedure, and confirms post-sedation plan of care and disposition.  Total time of sedation/monitoring: 30 minutes  (including critical care time) Labs Review Labs Reviewed - No data to display  Imaging Review No results found.   EKG Interpretation None      MDM   Final diagnoses:  Dislocated mandible, initial encounter    84yF with mandible dislocation. Hx of same. Several previous reductions in ED.   Unsuccessful attempts after IM valium and fentanyl.  Will Sedate.   Easily reduced when sedated. Observed until back to baseline. Reports feeling better. No further complaints.   Virgel Manifold, MD 09/10/14 [place], MD 09/23/14 [phone]

## 2014-09-10 NOTE — ED Notes (Signed)
Per EMS- Patient is a resident of [facility]. Staff found patient with a dislocated jaw. Staff reports that this has happened previously.

## 2014-09-10 NOTE — Discharge Instructions (Signed)
Jaw Dislocation A jaw dislocation is the displacement of the joint where the upper jaw bone (maxilla) and the lower jaw bone (mandibula) meet (temporomandibular joint). Soon after the dislocation, the jaw muscles tighten. This prevents the mouth from closing normally.  CAUSES A jaw dislocation usually is caused by a sudden forceful impact to the jaw. A strong punch in the jaw during a fist fight or a sports injury are examples of causes of jaw dislocation. Another cause is injury due to car or motorcycle accidents. RISK FACTORS Although anyone can have a jaw dislocation, some people are more at risk than others. People at increased risk for jaw dislocation include participants in contact sports. SYMPTOMS Symptoms of jaw dislocation can vary, depending on the severity of the dislocation. They can include:  Feeling that your teeth are out of alignment when you bite.  Inability to close your mouth completely.  Drooling.  Extreme pain, with the inability to move your jaw. DIAGNOSIS Your caregiver will feel your temporomandibular joints and ask you to move your jaw. Your caregiver also will feel the inside of your mouth to make sure there are no fractures or cuts (lacerations). TREATMENT Your caregiver will manipulate your jaw to put it back into place (reduction). If you have any jaw fractures from the dislocation, they usually will be held in place with plates and screws or with wiring.  HOME CARE INSTRUCTIONS The following measures can help to reduce pain and hasten the healing process:  Rest your injured joint. Do not move it. Avoid activities similar to the one that caused your injury.  Apply ice to your injured joint for 1 to 2 days after your reduction or as directed by your caregiver. Applying ice helps to reduce inflammation and pain.  Put ice in a plastic bag.  Place a towel between your skin and the bag.  Leave the ice on for 15-20 minutes at a time, 03-04 times a day.  Take  over-the-counter or prescription medication for pain as directed by your caregiver. Also, your caregiver may instruct you to only have certain foods until your jaw heals. These foods may be soft or liquified so that your jaw does not have to move much to eat them. SEEK IMMEDIATE MEDICAL CARE IF:  You have plates and screws or wiring to hold your jaw together that becomes loose or damaged.  You develop drainage from any of the cuts (incisions) where your wires or plates and screws were placed.  Your pain becomes worse rather than better. MAKE SURE YOU:  Understand these instructions.  Will watch your condition.  Will get help right away if you are not doing well or get worse. Document Released: 02/05/2000 Document Revised: 05/02/2011 Document Reviewed: 07/08/2010 [facility] Patient Information 2015 [place], [place]. This information is not intended to replace advice given to you by your health care provider. Make sure you discuss any questions you have with your health care provider.

## 2014-09-10 NOTE — ED Notes (Signed)
Bed: FE07 Expected date:  Expected time:  Means of arrival:  Comments: EMS-dislocated jaw

## 2014-09-11 ENCOUNTER — Non-Acute Institutional Stay (SKILLED_NURSING_FACILITY): Payer: Medicare Other | Admitting: Internal Medicine

## 2014-09-11 DIAGNOSIS — S030XXD Dislocation of jaw, subsequent encounter: Secondary | ICD-10-CM

## 2014-09-11 DIAGNOSIS — I480 Paroxysmal atrial fibrillation: Secondary | ICD-10-CM | POA: Diagnosis not present

## 2014-09-11 DIAGNOSIS — N189 Chronic kidney disease, unspecified: Secondary | ICD-10-CM | POA: Diagnosis not present

## 2014-09-11 DIAGNOSIS — S0300XD Dislocation of jaw, unspecified side, subsequent encounter: Secondary | ICD-10-CM

## 2014-09-11 DIAGNOSIS — I130 Hypertensive heart and chronic kidney disease with heart failure and stage 1 through stage 4 chronic kidney disease, or unspecified chronic kidney disease: Secondary | ICD-10-CM | POA: Diagnosis not present

## 2014-09-11 NOTE — Progress Notes (Signed)
Patient ID: Yvonne Buck, female   DOB: Dec 27, 1929, 81 y.o.   MRN: [id number]      [facility]  Code Status: DNR  Chief Complaint  Patient presents with  . Medical Management of Chronic Issues    Allergies  Allergen Reactions  . Penicillins Other (See Comments)    Red spots ### Tolerated Rocephin 12/2012 ###    HPI:   81 y/o female patient is seen today for routine visit. She is sitting on a geri chair, pleasantly confused, calm today. She was sent to ED yesterday with jaw dislocation for reduction. No falls reported. Says she is doing good this morning and denies any concerns. Weight has been stable. No new concern from nursing.  Review of Systems:  Constitutional: Negative for fever, chills, diaphoresis.   Respiratory: Negative for cough, shortness of breath and wheezing.    Cardiovascular: Negative for chest pain, palpitations Skin: Negative for itching and rash.    Past medical history reviewed  Medication reviewed. See [facility]   Medication List       This list is accurate as of: 09/11/14  4:33 PM.  Always use your most recent med list.               albuterol 108 (90 BASE) MCG/ACT inhaler  Commonly known as:  PROVENTIL HFA;VENTOLIN HFA  Inhale 2 puffs into the lungs every 6 (six) hours as needed for wheezing or shortness of breath.     amLODipine 5 MG tablet  Commonly known as:  NORVASC  Take 5 mg by mouth daily.     aspirin EC 81 MG tablet  Take 81 mg by mouth daily.     cholecalciferol 1000 UNITS tablet  Commonly known as:  VITAMIN D  Take 1,000 Units by mouth daily.     Fluticasone-Salmeterol 250-50 MCG/DOSE Aepb  Commonly known as:  ADVAIR  Inhale 1 puff into the lungs 2 (two) times daily.     ipratropium-albuterol 0.5-2.5 (3) MG/3ML Soln  Commonly known as:  DUONEB  Inhale 3 mLs into the lungs every 8 (eight) hours as needed. Shortness of breath and wheezing     isosorbide mononitrate 30 MG 24 hr tablet  Commonly known as:   IMDUR  Take 30 mg by mouth every morning.     losartan 100 MG tablet  Commonly known as:  COZAAR  Take 100 mg by mouth daily.     metoprolol 50 MG tablet  Commonly known as:  LOPRESSOR  Take 50 mg by mouth 2 (two) times daily.     montelukast 10 MG tablet  Commonly known as:  SINGULAIR  Take 10 mg by mouth at bedtime.     potassium chloride SA 20 MEQ tablet  Commonly known as:  K-DUR,KLOR-CON  Take 20 mEq by mouth daily.     simvastatin 40 MG tablet  Commonly known as:  ZOCOR  Take 40 mg by mouth daily.        Physical exam BP 138/64 mmHg  Pulse 78  Temp(Src) 98.9 F (37.2 C)  Resp 18  Wt 113 lb 12.8 oz (51.619 kg)  SpO2 99%  Constitutional: frail and in no acute distress.   HEENT: MMM, PERRLA, EOMI, no cervical lymphadenopathy, able to open and close her mouth without difficulty Cardiovascular: Normal rate, regular rhythm and intact distal pulses.    Respiratory: Effort normal and breath sounds normal. No respiratory distress. She has no wheezes.   Musculoskeletal: able to move all 4  extremities, on geri chair Neurological: She is alert, oriented to person  Psychiatric: poor insight and judgement   Assessment/plan  Dislocation of jaw Subsequent encounter. Continue to wear jaw support strap. Monitor clinically  Hypertensive heart and renal disease Stable, continue norvasc 5 mg daily, losartan 50 mg daily, imdur 30 mg daily, cozaar 100 mg daily and lopressor 50 mg bid for now  ckd Monitor renal function, continue losartan for now  afib Rate controlled. Continue aspirin 81 mg daily with lopressor 50 mg bid and monitor   Blanchie Serve, MD  [facility] [phone] (Monday-Friday 8 am - 5 pm) [phone] (afterhours)

## 2014-09-21 NOTE — Progress Notes (Addendum)
Electrophysiology Office Note Date: 09/22/2014  ID:  Yvonne Buck, DOB November 27, 1929, MRN [id number]  PCP: Blanchie Serve, MD Electrophysiologist: Allred  CC: Pacemaker follow-up  Yvonne Buck is a 81 y.o. female seen today for Dr Rayann Heman.  She underwent PPM implantation for SSS in [place] and currently resides in a SNF.  She presents today for routine electrophysiology followup.  She is alone today and does not communicate very much.  She does shake her head no to ROS questions when asked about chest pain, palpitations, dyspnea, PND, orthopnea, nausea, vomiting, dizziness, syncope.  Device History: BSX dual chamber PPM implanted 2006 for SSS   Past Medical History  Diagnosis Date  . Tachycardia-bradycardia syndrome     s/p [employer] PPM implant in [place]  . Paroxysmal atrial fibrillation     chads2vasc score is at least 6.  She is not a candidate for anticoagulation due to falls.  Her AF burden is low.  . Hypertension   . Frequent falls   . Asthma   . Hyperlipidemia   . Depression   . Anxiety   . Pacemaker   . Stroke   . Osteoporosis 06/23/2013  . Vitamin D deficiency   . Sick sinus syndrome   . Hypothyroidism   . Chronic kidney disease   . Fracture of greater trochanter of left femur 04/02/2013  . Hip fracture, left 04/02/2013  . Pelvic fracture 06/23/2013  . Senile osteoporosis 02/26/2014  . Rhinitis, allergic 02/26/2014  . TMJ (dislocation of temporomandibular joint) 06/16/2014   Past Surgical History  Procedure Laterality Date  . Pacemaker insertion  01/04/2005    [place] PPM 1290 [id number]), GDT [phone] atrial lead and 4457 V lead all implanted in [place]  . Cholecystectomy    . Gallbladder surgery    . Tonsillectomy and adenoidectomy      Current Outpatient Prescriptions  Medication Sig Dispense Refill  . albuterol (PROVENTIL HFA;VENTOLIN HFA) 108 (90 BASE) MCG/ACT inhaler Inhale 2 puffs into the lungs every 6 (six) hours as needed for wheezing or  shortness of breath. 1 Inhaler 2  . amLODipine (NORVASC) 5 MG tablet Take 5 mg by mouth daily.    Marland Kitchen aspirin EC 81 MG tablet Take 81 mg by mouth daily.    . cholecalciferol (VITAMIN D) 1000 UNITS tablet Take 1,000 Units by mouth daily.    . Fluticasone-Salmeterol (ADVAIR) 250-50 MCG/DOSE AEPB Inhale 1 puff into the lungs 2 (two) times daily.    Marland Kitchen ipratropium-albuterol (DUONEB) 0.5-2.5 (3) MG/3ML SOLN Inhale 3 mLs into the lungs every 8 (eight) hours as needed. Shortness of breath and wheezing    . isosorbide mononitrate (IMDUR) 30 MG 24 hr tablet Take 30 mg by mouth every morning.    Marland Kitchen losartan (COZAAR) 100 MG tablet Take 100 mg by mouth daily.    . metoprolol (LOPRESSOR) 50 MG tablet Take 50 mg by mouth 2 (two) times daily.    . montelukast (SINGULAIR) 10 MG tablet Take 10 mg by mouth at bedtime.    . potassium chloride SA (K-DUR,KLOR-CON) 20 MEQ tablet Take 20 mEq by mouth daily.    . simvastatin (ZOCOR) 40 MG tablet Take 40 mg by mouth daily.     No current facility-administered medications for this visit.    Allergies:   Penicillins   Social History: History   Social History  . Marital Status: Widowed    Spouse Name: N/A  . Number of Children: 2  . Years of Education: college  Occupational History  .      retired   Social History Main Topics  . Smoking status: Never Smoker   . Smokeless tobacco: Never Used  . Alcohol Use: No  . Drug Use: No  . Sexual Activity: No   Other Topics Concern  . Not on file   Social History Narrative   Currently from [facility].  Ambulates with a walker.      Previously lived in [place].     Daughter . [name].     Patient has some college education.   Right handed   Caffeine- None    Family History: Family History  Problem Relation Age of Onset  . Diabetes Mother   . High blood pressure Mother   . Heart Problems Father      Review of Systems: All other systems reviewed and are otherwise negative except as  noted above.   Physical Exam: VS:  BP 130/78 mmHg  Pulse 60  Ht 5\' 5"  (1.651 m)  Wt  , BMI There is no weight on file to calculate BMI.  GEN- The patient is elderly and thin appearing, not very communicative, unable to assess orientation HEENT: normocephalic, atraumatic; sclera clear, conjunctiva pink; hearing intact; oropharynx clear; neck supple  Lungs- Clear to ausculation bilaterally, normal work of breathing.  No wheezes, rales, rhonchi Heart- Regular rate and rhythm, no murmurs, rubs or gallops  GI- soft, non-tender, non-distended, bowel sounds present  Extremities- no clubbing, cyanosis, or edema; DP/PT/radial pulses 2+ bilaterally MS- age appropriate atrophy Skin- warm and dry, no rash or lesion; PPM pocket well healed, healing wounds on bilateral shins Psych- euthymic mood, full affect Neuro- strength and sensation are intact  PPM Interrogation- reviewed in detail today,  See PACEART report  EKG:  EKG demonstrated atrial pacing with intrinsic ventricular conduction  Recent Labs: 06/16/2014: B Natriuretic Peptide 125.6*; TSH 0.488 06/21/2014: ALT 30; BUN 34*; Creatinine, Ser 0.76; Hemoglobin 14.4; Platelets 327; Potassium 3.7; Sodium 137   Wt Readings from Last 3 Encounters:  09/11/14 113 lb 12.8 oz (51.619 kg)  08/07/14 112 lb (50.803 kg)  07/05/14 110 lb (49.896 kg)     Other studies Reviewed: Additional studies/ records that were reviewed today include:Dr Allred's office notes  Assessment and Plan:  1.  Sick sinus syndrome Normal PPM function Battery longevity 12 months See Claudia Desanctis Art report No changes today  2.  Paroxysmal atrial fibrillation Not felt to be a candidate for [place] 2/2 frequent falls CHADS2VASC is at least 6 AF burden low by device interrogation  3.  HTN Stable No change required today    Current medicines are reviewed at length with the patient today.   The patient does not have concerns regarding her medicines.  The following changes were  made today:  none  Labs/ tests ordered today include: none   Disposition:   Follow up with Dr Rayann Heman in 6 months   Signed, Chanetta Marshall, NP 09/22/2014 11:27 AM  [facility] [address] [place]  [address] [phone] (office) [phone] (fax)

## 2014-09-22 ENCOUNTER — Encounter: Payer: Self-pay | Admitting: Nurse Practitioner

## 2014-09-22 ENCOUNTER — Other Ambulatory Visit: Payer: Self-pay | Admitting: Internal Medicine

## 2014-09-22 ENCOUNTER — Ambulatory Visit (INDEPENDENT_AMBULATORY_CARE_PROVIDER_SITE_OTHER): Payer: Medicare Other | Admitting: Nurse Practitioner

## 2014-09-22 VITALS — BP 130/78 | HR 60 | Ht 65.0 in

## 2014-09-22 DIAGNOSIS — I10 Essential (primary) hypertension: Secondary | ICD-10-CM

## 2014-09-22 DIAGNOSIS — I480 Paroxysmal atrial fibrillation: Secondary | ICD-10-CM

## 2014-09-22 DIAGNOSIS — I495 Sick sinus syndrome: Secondary | ICD-10-CM

## 2014-09-22 LAB — CUP PACEART INCLINIC DEVICE CHECK
Date Time Interrogation Session: 20160801114519
MDC IDC SESS DTM: 20160825083936
Pulse Gen Model: 1290
Pulse Gen Serial Number: 764926
Pulse Gen Serial Number: 764926

## 2014-09-22 NOTE — Patient Instructions (Addendum)
Medication Instructions:   Your physician recommends that you continue on your current medications as directed. Please refer to the Current Medication list given to you today.  Labwork:   Testing/Procedures:   Follow-Up:  Your physician wants you to follow-up in:  [place] will receive a reminder letter in the mail two months in advance. If you don't receive a letter, please call our office to schedule the follow-up appointment.     Any Other Special Instructions Will Be Listed Below (If Applicable).

## 2014-10-14 ENCOUNTER — Encounter: Payer: Self-pay | Admitting: Internal Medicine

## 2014-10-15 ENCOUNTER — Encounter: Payer: Self-pay | Admitting: Internal Medicine

## 2014-10-15 ENCOUNTER — Non-Acute Institutional Stay (SKILLED_NURSING_FACILITY): Payer: Medicare Other | Admitting: Internal Medicine

## 2014-10-15 DIAGNOSIS — E785 Hyperlipidemia, unspecified: Secondary | ICD-10-CM | POA: Diagnosis not present

## 2014-10-15 DIAGNOSIS — I130 Hypertensive heart and chronic kidney disease with heart failure and stage 1 through stage 4 chronic kidney disease, or unspecified chronic kidney disease: Secondary | ICD-10-CM | POA: Diagnosis not present

## 2014-10-15 DIAGNOSIS — I5032 Chronic diastolic (congestive) heart failure: Secondary | ICD-10-CM | POA: Diagnosis not present

## 2014-10-15 DIAGNOSIS — M810 Age-related osteoporosis without current pathological fracture: Secondary | ICD-10-CM | POA: Diagnosis not present

## 2014-10-15 DIAGNOSIS — E876 Hypokalemia: Secondary | ICD-10-CM

## 2014-10-15 DIAGNOSIS — R300 Dysuria: Secondary | ICD-10-CM

## 2014-10-15 DIAGNOSIS — J45909 Unspecified asthma, uncomplicated: Secondary | ICD-10-CM | POA: Diagnosis not present

## 2014-10-15 DIAGNOSIS — I10 Essential (primary) hypertension: Secondary | ICD-10-CM | POA: Diagnosis not present

## 2014-10-15 DIAGNOSIS — I1310 Hypertensive heart and chronic kidney disease without heart failure, with stage 1 through stage 4 chronic kidney disease, or unspecified chronic kidney disease: Secondary | ICD-10-CM | POA: Insufficient documentation

## 2014-10-15 NOTE — Progress Notes (Addendum)
Patient ID: Yvonne Buck, female   DOB: 1929-07-27, 81 y.o.   MRN: [id number]     [facility]  Code Status: DNR  Chief Complaint  Patient presents with  . Medical Management of Chronic Issues    Medical Management of Chronic Issues     Allergies  Allergen Reactions  . Penicillins Other (See Comments)    Red spots ### Tolerated Rocephin 12/2012 ###    HPI:   81 y/o female patient is seen today for routine visit. She is sitting on a geri chair. She complaints of burning and pain with urination. She also has increased frequency. No other concerns. No new concern from staff. No falls reported  Review of Systems:  Constitutional: Negative for fever, chills, diaphoresis.   Respiratory: Negative for cough, shortness of breath and wheezing.    Cardiovascular: Negative for chest pain, palpitations Skin: Negative for itching and rash.   GI: negative for abdominal pain, nausea or vomiting  Past medical history reviewed  Medication reviewed. See [facility]   Medication List       This list is accurate as of: 10/15/14  1:26 PM.  Always use your most recent med list.               albuterol 108 (90 BASE) MCG/ACT inhaler  Commonly known as:  PROVENTIL HFA;VENTOLIN HFA  Inhale 2 puffs into the lungs every 6 (six) hours as needed for wheezing or shortness of breath.     amLODipine 5 MG tablet  Commonly known as:  NORVASC  Take one tablet by mouth once daily for hypertension     aspirin EC 81 MG tablet  Take one tablet by mouth once daily for AFIB     cholecalciferol 1000 UNITS tablet  Commonly known as:  VITAMIN D  Take one tablet by mouth once daily for vitamin D deficiency     Fluticasone-Salmeterol 250-50 MCG/DOSE Aepb  Commonly known as:  ADVAIR  Inhale one puff into the lungs twice daily for SOB/Wheeze. Rinse mouth after use.     ipratropium-albuterol 0.5-2.5 (3) MG/3ML Soln  Commonly known as:  DUONEB  Inhale 3 mLs into the lungs every 8 (eight) hours  as needed. Shortness of breath and wheezing     isosorbide mononitrate 30 MG 24 hr tablet  Commonly known as:  IMDUR  Take one tablet by mouth every morning for hypertension     losartan 100 MG tablet  Commonly known as:  COZAAR  Take one tablet by mouth once daily for hypertension     metoprolol 50 MG tablet  Commonly known as:  LOPRESSOR  Take one tablet by mouth twice daily for hypertension     montelukast 10 MG tablet  Commonly known as:  SINGULAIR  Take one tablet by mouth every night at bedtime for allergies     potassium chloride SA 20 MEQ tablet  Commonly known as:  K-DUR,KLOR-CON  Take one tablet by mouth once daily with food for hypokalemia     simvastatin 40 MG tablet  Commonly known as:  ZOCOR  Take one tablet by mouth once daily for cholesterol        Physical exam BP 116/60 mmHg  Pulse 59  Temp(Src) 98.2 F (36.8 C) (Oral)  Resp 19  Ht 5\' 5"  (1.651 m)  Wt 112 lb 6.4 oz (50.984 kg)  BMI 18.70 kg/m2  Wt Readings from Last 3 Encounters:  10/15/14 112 lb 6.4 oz (50.984 kg)  09/11/14 113  lb 12.8 oz (51.619 kg)  08/07/14 112 lb (50.803 kg)   Constitutional: elderly, frail and in no acute distress.   HEENT: MMM, PERRLA, EOMI, no cervical lymphadenopathy, able to open and close her mouth without difficulty Cardiovascular: Normal rate, regular rhythm and intact distal pulses.    Respiratory: Effort normal and breath sounds normal. No respiratory distress. She has no wheezes.   Musculoskeletal: able to move all 4 extremities, on geri chair Neurological: She is alert, oriented to person  Psychiatric: poor insight and judgement  Labs CBC Latest Ref Rng 06/21/2014 06/20/2014 06/19/2014  WBC 4.0 - 10.5 K/uL 9.2 14.3(H) 12.7(H)  Hemoglobin 12.0 - 15.0 g/dL 14.4 16.0(H) 15.0  Hematocrit 36.0 - 46.0 % 45.2 50.3(H) 45.5  Platelets 150 - 400 K/uL 327 381 335   CMP Latest Ref Rng 06/21/2014 06/20/2014 06/19/2014  Glucose 70 - 99 mg/dL 219(H) 120(H) 119(H)  BUN 6 - 23  mg/dL 34(H) 33(H) 25(H)  Creatinine 0.50 - 1.10 mg/dL 0.76 0.60 0.60  Sodium 135 - 145 mmol/L 137 139 139  Potassium 3.5 - 5.1 mmol/L 3.7 3.3(L) 3.2(L)  Chloride 96 - 112 mmol/L 98 101 99  CO2 19 - 32 mmol/L 29 31 29   Calcium 8.4 - 10.5 mg/dL 8.9 9.2 9.6  Total Protein 6.0 - 8.3 g/dL 6.2 - 7.1  Total Bilirubin 0.3 - 1.2 mg/dL 0.4 - 0.5  Alkaline Phos 39 - 117 U/L 62 - 68  AST 0 - 37 U/L 20 - 19  ALT 0 - 35 U/L 30 - 21    Assessment/plan  Hyperlipidemia No recent lipid panel for review. Currently on zocor 40 mg daily, check lipid panel  Diastolic heart failure Stable, continue losartan 50 mg daily, imdur 30 mg daily, cozaar 100 mg daily and lopressor 50 mg bid for now.  Chronic asthma Clear lung exam, breathing stable. Continue singulair with advair and prn duoneb, monitor  Hypokalemia Continue kcl supplement, monitor bmp  Senile osteoporosis No recent falls. Continue vitamin d supplement. Fall precautions  Hypertensive heart and renal disease Stable, continue norvasc 5 mg daily, losartan 50 mg daily, imdur 30 mg daily, cozaar 100 mg daily and lopressor 50 mg bid for now. Check cmp and CBC  Dysuria Afebrile, send u/a with c/s, hydration encouraged  Blanchie Serve, MD  [facility] [phone] (Monday-Friday 8 am - 5 pm) [phone] (afterhours)

## 2014-11-18 ENCOUNTER — Non-Acute Institutional Stay (SKILLED_NURSING_FACILITY): Payer: Medicare Other | Admitting: Internal Medicine

## 2014-11-18 DIAGNOSIS — J4520 Mild intermittent asthma, uncomplicated: Secondary | ICD-10-CM | POA: Diagnosis not present

## 2014-11-18 DIAGNOSIS — E43 Unspecified severe protein-calorie malnutrition: Secondary | ICD-10-CM | POA: Diagnosis not present

## 2014-11-18 DIAGNOSIS — G4700 Insomnia, unspecified: Secondary | ICD-10-CM

## 2014-11-18 NOTE — Progress Notes (Signed)
Patient ID: Yvonne Buck, female   DOB: 02/13/1930, 81 y.o.   MRN: [id number]     Yvonne Buck  Code Status: DNR  Chief Complaint  Patient presents with  . Medical Management of Chronic Issues    Allergies  Allergen Reactions  . Penicillins Other (See Comments)    Red spots ### Tolerated Rocephin 12/2012 ###    HPI:   81 y/o female patient is seen today for routine visit. Denies any concerns. No new concern from staff. No falls reported. No skin breakdown. No acute behavioral change. Has been started on remeron to help with sleep and appetite.   Review of Systems:  Constitutional: Negative for fever, chills, diaphoresis.   Respiratory: Negative for cough, shortness of breath and wheezing.    Cardiovascular: Negative for chest pain, palpitations Skin: Negative for itching and rash.   GI: negative for abdominal pain, nausea or vomiting  Past medical history reviewed  Medication reviewed. See Yvonne Buck   Medication List       This list is accurate as of: 11/18/14  3:34 PM.  Always use your most recent med list.               albuterol 108 (90 BASE) MCG/ACT inhaler  Commonly known as:  PROVENTIL HFA;VENTOLIN HFA  Inhale 2 puffs into the lungs every 6 (six) hours as needed for wheezing or shortness of breath.     amLODipine 5 MG tablet  Commonly known as:  NORVASC  Take one tablet by mouth once daily for hypertension     aspirin EC 81 MG tablet  Take one tablet by mouth once daily for AFIB     cholecalciferol 1000 UNITS tablet  Commonly known as:  VITAMIN D  Take one tablet by mouth once daily for vitamin D deficiency     Fluticasone-Salmeterol 250-50 MCG/DOSE Aepb  Commonly known as:  ADVAIR  Inhale one puff into the lungs twice daily for SOB/Wheeze. Rinse mouth after use.     ipratropium-albuterol 0.5-2.5 (3) MG/3ML Soln  Commonly known as:  DUONEB  Inhale 3 mLs into the lungs every 8 (eight) hours as needed. Shortness of breath and wheezing     isosorbide mononitrate 30 MG 24 hr tablet  Commonly known as:  IMDUR  Take one tablet by mouth every morning for hypertension     losartan 100 MG tablet  Commonly known as:  COZAAR  Take one tablet by mouth once daily for hypertension     metoprolol 50 MG tablet  Commonly known as:  LOPRESSOR  Take one tablet by mouth twice daily for hypertension     mirtazapine 7.5 MG tablet  Commonly known as:  REMERON  Take 7.5 mg by mouth at bedtime.     montelukast 10 MG tablet  Commonly known as:  SINGULAIR  Take one tablet by mouth every night at bedtime for allergies     potassium chloride SA 20 MEQ tablet  Commonly known as:  K-DUR,KLOR-CON  Take one tablet by mouth once daily with food for hypokalemia     simvastatin 40 MG tablet  Commonly known as:  ZOCOR  Take one tablet by mouth once daily for cholesterol        Physical exam BP 127/60 mmHg  Pulse 62  Temp(Src) 98.1 F (36.7 C)  Resp 18  Wt 112 lb 3.2 oz (50.894 kg)  Wt Readings from Last 3 Encounters:  11/18/14 112 lb 3.2 oz (50.894 kg)  10/15/14 112  lb 6.4 oz (50.984 kg)  09/11/14 113 lb 12.8 oz (51.619 kg)   Body mass index is 18.67 kg/(m^2).  Constitutional: elderly, frail and in no acute distress.   HEENT: MMM, PERRLA, EOMI, no cervical lymphadenopathy, able to open and close her mouth without difficulty Cardiovascular: Normal rate, regular rhythm and intact distal pulses.    Respiratory: Effort normal and breath sounds normal. No respiratory distress. She has no wheezes.   Musculoskeletal: able to move all 4 extremities, on geri chair, muscle wasting present Neurological: She is alert, oriented to person  Psychiatric: poor insight and judgement  Labs CBC Latest Ref Rng 06/21/2014 06/20/2014 06/19/2014  WBC 4.0 - 10.5 K/uL 9.2 14.3(H) 12.7(H)  Hemoglobin 12.0 - 15.0 g/dL 14.4 16.0(H) 15.0  Hematocrit 36.0 - 46.0 % 45.2 50.3(H) 45.5  Platelets 150 - 400 K/uL 327 381 335   CMP Latest Ref Rng 06/21/2014  06/20/2014 06/19/2014  Glucose 70 - 99 mg/dL 219(H) 120(H) 119(H)  BUN 6 - 23 mg/dL 34(H) 33(H) 25(H)  Creatinine 0.50 - 1.10 mg/dL 0.76 0.60 0.60  Sodium 135 - 145 mmol/L 137 139 139  Potassium 3.5 - 5.1 mmol/L 3.7 3.3(L) 3.2(L)  Chloride 96 - 112 mmol/L 98 101 99  CO2 19 - 32 mmol/L 29 31 29   Calcium 8.4 - 10.5 mg/dL 8.9 9.2 9.6  Total Protein 6.0 - 8.3 g/dL 6.2 - 7.1  Total Bilirubin 0.3 - 1.2 mg/dL 0.4 - 0.5  Alkaline Phos 39 - 117 U/L 62 - 68  AST 0 - 37 U/L 20 - 19  ALT 0 - 35 U/L 30 - 21    Assessment/plan  Asthma  breathing stable continue singulair and advair with prn albuterol. D/c duoneb with her not requiring it at present  Insomnia Stable with remeron 7.5 mg daily  Protein calorie malnutrition Started on remeron to help stimulate appetite. Continue mechanical soft diet, aspiration precautions. Monitor weight.   Blanchie Serve, MD  [facility] [phone] (Monday-Friday 8 am - 5 pm) [phone] (afterhours)

## 2014-12-28 DIAGNOSIS — G4700 Insomnia, unspecified: Secondary | ICD-10-CM | POA: Insufficient documentation

## 2015-03-12 ENCOUNTER — Emergency Department (HOSPITAL_COMMUNITY)
Admission: EM | Admit: 2015-03-12 | Discharge: 2015-03-12 | Disposition: A | Discharge status: Home / Self Care | Payer: Medicare Other | Attending: Emergency Medicine | Admitting: Emergency Medicine

## 2015-03-12 ENCOUNTER — Encounter (HOSPITAL_COMMUNITY): Payer: Self-pay | Admitting: Emergency Medicine

## 2015-03-12 ENCOUNTER — Emergency Department (HOSPITAL_COMMUNITY): Payer: Medicare Other

## 2015-03-12 DIAGNOSIS — X58XXXA Exposure to other specified factors, initial encounter: Secondary | ICD-10-CM | POA: Diagnosis not present

## 2015-03-12 DIAGNOSIS — E039 Hypothyroidism, unspecified: Secondary | ICD-10-CM | POA: Insufficient documentation

## 2015-03-12 DIAGNOSIS — Z8781 Personal history of (healed) traumatic fracture: Secondary | ICD-10-CM | POA: Diagnosis not present

## 2015-03-12 DIAGNOSIS — Y9389 Activity, other specified: Secondary | ICD-10-CM | POA: Diagnosis not present

## 2015-03-12 DIAGNOSIS — Y9289 Other specified places as the place of occurrence of the external cause: Secondary | ICD-10-CM | POA: Diagnosis not present

## 2015-03-12 DIAGNOSIS — F419 Anxiety disorder, unspecified: Secondary | ICD-10-CM | POA: Diagnosis not present

## 2015-03-12 DIAGNOSIS — J45901 Unspecified asthma with (acute) exacerbation: Secondary | ICD-10-CM | POA: Insufficient documentation

## 2015-03-12 DIAGNOSIS — S0993XA Unspecified injury of face, initial encounter: Secondary | ICD-10-CM | POA: Diagnosis present

## 2015-03-12 DIAGNOSIS — Z79899 Other long term (current) drug therapy: Secondary | ICD-10-CM | POA: Diagnosis not present

## 2015-03-12 DIAGNOSIS — S0300XA Dislocation of jaw, unspecified side, initial encounter: Secondary | ICD-10-CM

## 2015-03-12 DIAGNOSIS — S0301XA Dislocation of jaw, right side, initial encounter: Secondary | ICD-10-CM | POA: Insufficient documentation

## 2015-03-12 DIAGNOSIS — E785 Hyperlipidemia, unspecified: Secondary | ICD-10-CM | POA: Diagnosis not present

## 2015-03-12 DIAGNOSIS — Z880 Allergy status to penicillin: Secondary | ICD-10-CM | POA: Diagnosis not present

## 2015-03-12 DIAGNOSIS — F329 Major depressive disorder, single episode, unspecified: Secondary | ICD-10-CM | POA: Insufficient documentation

## 2015-03-12 DIAGNOSIS — I129 Hypertensive chronic kidney disease with stage 1 through stage 4 chronic kidney disease, or unspecified chronic kidney disease: Secondary | ICD-10-CM | POA: Insufficient documentation

## 2015-03-12 DIAGNOSIS — Y998 Other external cause status: Secondary | ICD-10-CM | POA: Insufficient documentation

## 2015-03-12 DIAGNOSIS — Z7982 Long term (current) use of aspirin: Secondary | ICD-10-CM | POA: Diagnosis not present

## 2015-03-12 DIAGNOSIS — Z8673 Personal history of transient ischemic attack (TIA), and cerebral infarction without residual deficits: Secondary | ICD-10-CM | POA: Diagnosis not present

## 2015-03-12 DIAGNOSIS — S0302XA Dislocation of jaw, left side, initial encounter: Secondary | ICD-10-CM | POA: Diagnosis not present

## 2015-03-12 DIAGNOSIS — N189 Chronic kidney disease, unspecified: Secondary | ICD-10-CM | POA: Insufficient documentation

## 2015-03-12 DIAGNOSIS — R062 Wheezing: Secondary | ICD-10-CM

## 2015-03-12 LAB — BASIC METABOLIC PANEL
Anion gap: 6 (ref 5–15)
BUN: 14 mg/dL (ref 6–20)
CHLORIDE: 104 mmol/L (ref 101–111)
CO2: 33 mmol/L — AB (ref 22–32)
CREATININE: 0.6 mg/dL (ref 0.44–1.00)
Calcium: 9.3 mg/dL (ref 8.9–10.3)
GFR calc Af Amer: >60 mL/min (ref >60)
GFR calc non Af Amer: >60 mL/min (ref >60)
GLUCOSE: 103 mg/dL — AB (ref 65–99)
POTASSIUM: 4.2 mmol/L (ref 3.5–5.1)
Sodium: 143 mmol/L (ref 135–145)

## 2015-03-12 LAB — CBC
HCT: 46 % (ref 36.0–46.0)
Hemoglobin: 14.5 g/dL (ref 12.0–15.0)
MCH: 28.6 pg (ref 26.0–34.0)
MCHC: 31.5 g/dL (ref 30.0–36.0)
MCV: 90.7 fL (ref 78.0–100.0)
PLATELETS: 281 10*3/uL (ref 150–400)
RBC: 5.07 MIL/uL (ref 3.87–5.11)
RDW: 14.3 % (ref 11.5–15.5)
WBC: 9.3 10*3/uL (ref 4.0–10.5)

## 2015-03-12 LAB — CBG MONITORING, ED: Glucose-Capillary: 99 mg/dL (ref 65–99)

## 2015-03-12 MED ORDER — FENTANYL CITRATE (PF) 100 MCG/2ML IJ SOLN
100.0000 ug | Freq: Once | INTRAMUSCULAR | Status: AC
  Administered 2015-03-12: 100 ug via INTRAMUSCULAR
  Filled 2015-03-12: qty 2

## 2015-03-12 MED ORDER — PROPOFOL 10 MG/ML IV BOLUS
INTRAVENOUS | Status: AC
  Filled 2015-03-12: qty 20

## 2015-03-12 MED ORDER — PROPOFOL 10 MG/ML IV BOLUS
0.5000 mg/kg | Freq: Once | INTRAVENOUS | Status: AC
  Administered 2015-03-12: 15 mg via INTRAVENOUS
  Administered 2015-03-12: 25 mg via INTRAVENOUS

## 2015-03-12 MED ORDER — ALBUTEROL SULFATE (2.5 MG/3ML) 0.083% IN NEBU
5.0000 mg | INHALATION_SOLUTION | Freq: Once | RESPIRATORY_TRACT | Status: AC
  Administered 2015-03-12: 5 mg via RESPIRATORY_TRACT
  Filled 2015-03-12: qty 6

## 2015-03-12 MED ORDER — DIAZEPAM 5 MG/ML IJ SOLN
5.0000 mg | Freq: Once | INTRAMUSCULAR | Status: AC
Start: 2015-03-12 — End: 2015-03-12
  Administered 2015-03-12: 5 mg via INTRAMUSCULAR
  Filled 2015-03-12: qty 2

## 2015-03-12 NOTE — ED Notes (Signed)
Pt from [facility]. Staff states they went in her room to check on her and pt's jaw out of place. This is recurring problem for pt. BP 138/62, HR 70, 94% on room air.

## 2015-03-12 NOTE — ED Notes (Signed)
Pt more active now - had pulled off all of her cords when this RN went in to room.  Speaking to RN more capably at this time.  Denies pain.

## 2015-03-12 NOTE — Sedation Documentation (Signed)
Jaw has been relocated per EDP and PA

## 2015-03-12 NOTE — ED Notes (Signed)
Patient able to drink from straw successfully.  Swallowing strong, no coughing or drooling noted during PO challenge.  Pt smiled and nodded during PO challenge.  PA made aware.

## 2015-03-12 NOTE — Discharge Instructions (Signed)
1. Medications: usual home medications 2. Treatment: rest, drink plenty of fluids,  3. Follow Up: Please followup with your primary doctor in 2-3 days for discussion of your diagnoses and further evaluation after today's visit; if you do not have a primary care doctor use the resource guide provided to find one; Please return to the ER for additional dislocations or other concerns

## 2015-03-12 NOTE — ED Notes (Signed)
PTAR at bedside for patient

## 2015-03-12 NOTE — ED Provider Notes (Signed)
CSN: [id number]     Arrival date & time 03/12/15  1042 History   First MD Initiated Contact with Patient 03/12/15 1045     Chief Complaint  Patient presents with  . Jaw Pain     (Consider location/radiation/quality/duration/timing/severity/associated sxs/prior Treatment) The history is provided by the patient, medical records and the EMS personnel. No language interpreter was used.     Yvonne Buck is a 82 y.o. female  with a hx of tachybradycardia syndrome, A. Fib, pacemaker, asthma, hyperlipidemia, anxiety, chronic kidney disease presents to the Emergency Department complaining of acute onset mandible dislocation onset sometime overnight. Patient is from assisted living facility and when staff went to check on her told EMS that her jaw was out of place. Patient answers yes no questions. She reports this has happened before and often at night. She reports no previous surgeries or need for sedation for relocation of the jaw.  Associated symptoms include pain in the jaw. She reports she thinks both sides are dislocated.  She denies fever, chills, nausea, vomiting. She denies falls or known trauma to her jaw.  Past Medical History  Diagnosis Date  . Tachycardia-bradycardia syndrome [facility])     s/p Chemical engineer PPM implant in [place]  . Paroxysmal atrial fibrillation (HCC)     chads2vasc score is at least 6.  She is not a candidate for anticoagulation due to falls.  Her AF burden is low.  . Hypertension   . Frequent falls   . Asthma   . Hyperlipidemia   . Depression   . Anxiety   . Pacemaker   . Stroke ([place])   . Osteoporosis 06/23/2013  . Vitamin D deficiency   . Sick sinus syndrome ([place])   . Hypothyroidism   . Chronic kidney disease   . Fracture of greater trochanter of left femur ([place]) 04/02/2013  . Hip fracture, left ([place]) 04/02/2013  . Pelvic fracture ([place]) 06/23/2013  . Senile osteoporosis 02/26/2014  . Rhinitis, allergic 02/26/2014  . TMJ (dislocation of temporomandibular joint)  06/16/2014   Past Surgical History  Procedure Laterality Date  . Pacemaker insertion  01/04/2005    [place] PPM 1290 [phone]), GDT [phone] atrial lead and 4457 V lead all implanted in [place]  . Cholecystectomy    . Gallbladder surgery    . Tonsillectomy and adenoidectomy     Family History  Problem Relation Age of Onset  . Diabetes Mother   . High blood pressure Mother   . Heart Problems Father    Social History  Substance Use Topics  . Smoking status: Never Smoker   . Smokeless tobacco: Never Used  . Alcohol Use: No   OB History    No data available     Review of Systems  Constitutional: Negative for fever, diaphoresis, appetite change, fatigue and unexpected weight change.  HENT: Negative for mouth sores.        Jaw dislocation  Eyes: Negative for visual disturbance.  Respiratory: Negative for cough, chest tightness, shortness of breath and wheezing.   Cardiovascular: Negative for chest pain.  Gastrointestinal: Negative for nausea, vomiting, abdominal pain, diarrhea and constipation.  Endocrine: Negative for polydipsia, polyphagia and polyuria.  Genitourinary: Negative for dysuria, urgency, frequency and hematuria.  Musculoskeletal: Negative for back pain and neck stiffness.  Skin: Negative for rash.  Allergic/Immunologic: Negative for immunocompromised state.  Neurological: Negative for syncope, light-headedness and headaches.  Hematological: Does not bruise/bleed easily.  Psychiatric/Behavioral: Negative for sleep disturbance. The patient is not  nervous/anxious.       Allergies  Penicillins  Home Medications   Prior to Admission medications   Medication Sig Start Date End Date Taking? Authorizing Provider  amLODipine (NORVASC) 5 MG tablet Take one tablet by mouth once daily for hypertension 08/21/14  Yes Historical Provider, MD  aspirin EC 81 MG tablet Take one tablet by mouth once daily for AFIB   Yes Historical Provider, MD  cholecalciferol  (VITAMIN D) 1000 UNITS tablet Take one tablet by mouth once daily for vitamin D deficiency   Yes Historical Provider, MD  Fluticasone-Salmeterol (ADVAIR) 250-50 MCG/DOSE AEPB Inhale one puff into the lungs twice daily for SOB/Wheeze. Rinse mouth after use.   Yes Historical Provider, MD  isosorbide mononitrate (IMDUR) 30 MG 24 hr tablet Take one tablet by mouth every morning for hypertension   Yes Historical Provider, MD  losartan (COZAAR) 100 MG tablet Take one tablet by mouth once daily for hypertension   Yes Historical Provider, MD  memantine (NAMENDA XR) 28 MG CP24 24 hr capsule Take 28 mg by mouth daily.   Yes Historical Provider, MD  metoprolol (LOPRESSOR) 50 MG tablet Take one tablet by mouth twice daily for hypertension   Yes Historical Provider, MD  mirtazapine (REMERON) 7.5 MG tablet Take 7.5 mg by mouth at bedtime.   Yes Historical Provider, MD  montelukast (SINGULAIR) 10 MG tablet Take one tablet by mouth every night at bedtime for allergies   Yes Historical Provider, MD  potassium chloride SA (K-DUR,KLOR-CON) 20 MEQ tablet Take one tablet by mouth once daily with food for hypokalemia   Yes Historical Provider, MD  simvastatin (ZOCOR) 20 MG tablet Take 20 mg by mouth daily.   Yes Historical Provider, MD  albuterol (PROVENTIL HFA;VENTOLIN HFA) 108 (90 BASE) MCG/ACT inhaler Inhale 2 puffs into the lungs every 6 (six) hours as needed for wheezing or shortness of breath. 06/19/14   Nita Sells, MD  ipratropium-albuterol (DUONEB) 0.5-2.5 (3) MG/3ML SOLN Inhale 3 mLs into the lungs every 8 (eight) hours as needed. Shortness of breath and wheezing 06/27/14   Historical Provider, MD  simvastatin (ZOCOR) 40 MG tablet Take 40 mg by mouth daily.     Historical Provider, MD   BP 110/53 mmHg  Pulse 64  Temp(Src) 97.8 F (36.6 C) (Axillary)  Resp 21  Wt 50.803 kg  SpO2 97% Physical Exam  Constitutional: She appears well-developed and well-nourished. No distress.  Awake, alert, nontoxic  appearance  HENT:  Head: Normocephalic and atraumatic.  Nose: No mucosal edema or rhinorrhea.  Mouth/Throat: Uvula is midline, oropharynx is clear and moist and mucous membranes are normal. No oropharyngeal exudate.  Obvious mandible dislocation; deformity to the TMJ bilaterally with muscle spasm. Pt handling secretions  Eyes: Conjunctivae are normal. No scleral icterus.  Neck: Normal range of motion. Neck supple.  Cardiovascular: Normal rate, regular rhythm and intact distal pulses.   Pulmonary/Chest: Effort normal and breath sounds normal. No respiratory distress. She has no wheezes.  Equal chest expansion  Abdominal: Soft. Bowel sounds are normal. She exhibits no mass. There is no tenderness. There is no rebound and no guarding.  Musculoskeletal: Normal range of motion. She exhibits no edema.  Neurological: She is alert.  Speech is clear and goal oriented Moves extremities without ataxia  Skin: Skin is warm and dry. She is not diaphoretic.  Psychiatric: She has a normal mood and affect.  Nursing note and vitals reviewed.   ED Course  Procedures (including critical care time) Labs Review  Labs Reviewed  BASIC METABOLIC PANEL - Abnormal; Notable for the following:    CO2 33 (*)    Glucose, Bld 103 (*)    All other components within normal limits  CBC    Imaging Review Dg Mandible 1-3 Views  03/12/2015  CLINICAL DATA:  82 year old female status post reduction of mandible dislocation. EXAM: MANDIBLE - 1-3 VIEW FINDINGS: No lateral views are submitted for evaluation. On the 2 frontal projections the mandible appears grossly intact. IMPRESSION: 1. Limited examination demonstrating no acute abnormality of the mandible. Electronically Signed   By: Vinnie Langton M.D.   On: 03/12/2015 16:12   I have personally reviewed and evaluated these images and lab results as part of my medical decision-making.   EKG Interpretation None      CRITICAL CARE Performed by: Abigail Butts Total critical care time: 45 minutes Critical care time was exclusive of separately billable procedures and treating other patients. Critical care was necessary to treat or prevent imminent or life-threatening deterioration. Critical care was time spent personally by me on the following activities: development of treatment plan with patient and/or surrogate as well as nursing, discussions with consultants, evaluation of patient's response to treatment, examination of patient, obtaining history from patient or surrogate, ordering and performing treatments and interventions, ordering and review of laboratory studies, ordering and review of radiographic studies, pulse oximetry and re-evaluation of patient's condition.  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 Jaw is currently out of place. Patient is alert and able to follow commands.  Unable to get our reduction with hands off syringe approach. Will give pain medication and reattempt  12:47 PM Pt given valium and Fentanyl. She is able to bite down on the syringe but unable to roll it in her teeth.  On repeat exam patient wheezing. History of same. Will give breathing treatment.  3:17 PM Reduction of jaw dislocation by Dr.  Oleta Mouse under conscious sedation.  Complete resolution of wheezing after albuterol. Pt is now alert and following commands.  Will give PO trial before d/c home.    4:39 PM Pt is more awake.  She is drinking water without difficulty and following commands consistently.  X-ray is without obvious fracture.  Mandible has remained in place since reduction of dislocation.  Pt to be d/c home to her facility.    BP 110/53 mmHg  Pulse 64  Temp(Src) 97.8 F (36.6 C) (Axillary)  Resp 21  Wt 50.803 kg  SpO2 97%   The patient was discussed with and seen by Dr. Oleta Mouse who agrees with the treatment plan.   Jarrett Soho Abhimanyu Cruces, PA-C 03/12/15  Boyd Liu, MD 03/12/15 [phone]

## 2015-03-16 ENCOUNTER — Encounter (HOSPITAL_COMMUNITY): Payer: Self-pay | Admitting: Emergency Medicine

## 2015-03-16 ENCOUNTER — Emergency Department (HOSPITAL_COMMUNITY): Payer: Medicare Other

## 2015-03-16 ENCOUNTER — Emergency Department (HOSPITAL_COMMUNITY)
Admission: EM | Admit: 2015-03-16 | Discharge: 2015-03-16 | Disposition: A | Discharge status: Home / Self Care | Payer: Medicare Other | Attending: Emergency Medicine | Admitting: Emergency Medicine

## 2015-03-16 DIAGNOSIS — S0301XA Dislocation of jaw, right side, initial encounter: Secondary | ICD-10-CM | POA: Diagnosis not present

## 2015-03-16 DIAGNOSIS — Y9289 Other specified places as the place of occurrence of the external cause: Secondary | ICD-10-CM | POA: Insufficient documentation

## 2015-03-16 DIAGNOSIS — I129 Hypertensive chronic kidney disease with stage 1 through stage 4 chronic kidney disease, or unspecified chronic kidney disease: Secondary | ICD-10-CM | POA: Insufficient documentation

## 2015-03-16 DIAGNOSIS — M810 Age-related osteoporosis without current pathological fracture: Secondary | ICD-10-CM | POA: Diagnosis not present

## 2015-03-16 DIAGNOSIS — Z880 Allergy status to penicillin: Secondary | ICD-10-CM | POA: Diagnosis not present

## 2015-03-16 DIAGNOSIS — F329 Major depressive disorder, single episode, unspecified: Secondary | ICD-10-CM | POA: Insufficient documentation

## 2015-03-16 DIAGNOSIS — Z9181 History of falling: Secondary | ICD-10-CM | POA: Insufficient documentation

## 2015-03-16 DIAGNOSIS — Z8673 Personal history of transient ischemic attack (TIA), and cerebral infarction without residual deficits: Secondary | ICD-10-CM | POA: Insufficient documentation

## 2015-03-16 DIAGNOSIS — Z7951 Long term (current) use of inhaled steroids: Secondary | ICD-10-CM | POA: Diagnosis not present

## 2015-03-16 DIAGNOSIS — Y998 Other external cause status: Secondary | ICD-10-CM | POA: Insufficient documentation

## 2015-03-16 DIAGNOSIS — F419 Anxiety disorder, unspecified: Secondary | ICD-10-CM | POA: Insufficient documentation

## 2015-03-16 DIAGNOSIS — E559 Vitamin D deficiency, unspecified: Secondary | ICD-10-CM | POA: Diagnosis not present

## 2015-03-16 DIAGNOSIS — N189 Chronic kidney disease, unspecified: Secondary | ICD-10-CM | POA: Insufficient documentation

## 2015-03-16 DIAGNOSIS — Z79899 Other long term (current) drug therapy: Secondary | ICD-10-CM | POA: Diagnosis not present

## 2015-03-16 DIAGNOSIS — I495 Sick sinus syndrome: Secondary | ICD-10-CM | POA: Diagnosis not present

## 2015-03-16 DIAGNOSIS — X58XXXA Exposure to other specified factors, initial encounter: Secondary | ICD-10-CM | POA: Insufficient documentation

## 2015-03-16 DIAGNOSIS — Z950 Presence of cardiac pacemaker: Secondary | ICD-10-CM | POA: Insufficient documentation

## 2015-03-16 DIAGNOSIS — Y9389 Activity, other specified: Secondary | ICD-10-CM | POA: Insufficient documentation

## 2015-03-16 DIAGNOSIS — J45909 Unspecified asthma, uncomplicated: Secondary | ICD-10-CM | POA: Diagnosis not present

## 2015-03-16 DIAGNOSIS — S0300XA Dislocation of jaw, unspecified side, initial encounter: Secondary | ICD-10-CM

## 2015-03-16 DIAGNOSIS — Z8781 Personal history of (healed) traumatic fracture: Secondary | ICD-10-CM | POA: Diagnosis not present

## 2015-03-16 DIAGNOSIS — E785 Hyperlipidemia, unspecified: Secondary | ICD-10-CM | POA: Insufficient documentation

## 2015-03-16 DIAGNOSIS — Z7982 Long term (current) use of aspirin: Secondary | ICD-10-CM | POA: Insufficient documentation

## 2015-03-16 DIAGNOSIS — S0993XA Unspecified injury of face, initial encounter: Secondary | ICD-10-CM | POA: Diagnosis present

## 2015-03-16 MED ORDER — PROPOFOL 10 MG/ML IV BOLUS
0.5000 mg/kg | Freq: Once | INTRAVENOUS | Status: DC
  Filled 2015-03-16: qty 20

## 2015-03-16 MED ORDER — PROPOFOL 10 MG/ML IV BOLUS
INTRAVENOUS | Status: AC | PRN
  Administered 2015-03-16: 20 mg via INTRAVENOUS

## 2015-03-16 NOTE — ED Notes (Signed)
Bed: WA02 Expected date: 03/16/15 Expected time: 11:31 AM Means of arrival:  Comments: Jaw dislocation

## 2015-03-16 NOTE — ED Provider Notes (Signed)
CSN: [id number]     Arrival date & time 03/16/15  1133 History   First MD Initiated Contact with Patient 03/16/15 1147     Chief Complaint  Patient presents with  . Jaw Pain     (Consider location/radiation/quality/duration/timing/severity/associated sxs/prior Treatment) HPI Patient has had recurrent jaw dislocation. At this time is unclear when this occurred. Staff at nursing home found the patient with pain expression and holding her mouth open. No specific injury reported. The patient is a very limited historian. She cannot give significant detail. Past Medical History  Diagnosis Date  . Tachycardia-bradycardia syndrome [facility])     s/p Chemical engineer PPM implant in [place]  . Paroxysmal atrial fibrillation (HCC)     chads2vasc score is at least 6.  She is not a candidate for anticoagulation due to falls.  Her AF burden is low.  . Hypertension   . Frequent falls   . Asthma   . Hyperlipidemia   . Depression   . Anxiety   . Pacemaker   . Stroke ([place])   . Osteoporosis 06/23/2013  . Vitamin D deficiency   . Sick sinus syndrome ([place])   . Hypothyroidism   . Chronic kidney disease   . Fracture of greater trochanter of left femur ([place]) 04/02/2013  . Hip fracture, left ([place]) 04/02/2013  . Pelvic fracture ([place]) 06/23/2013  . Senile osteoporosis 02/26/2014  . Rhinitis, allergic 02/26/2014  . TMJ (dislocation of temporomandibular joint) 06/16/2014   Past Surgical History  Procedure Laterality Date  . Pacemaker insertion  01/04/2005    [place] PPM 1290 [phone]), GDT [phone] atrial lead and 4457 V lead all implanted in [place]  . Cholecystectomy    . Gallbladder surgery    . Tonsillectomy and adenoidectomy     Family History  Problem Relation Age of Onset  . Diabetes Mother   . High blood pressure Mother   . Heart Problems Father    Social History  Substance Use Topics  . Smoking status: Never Smoker   . Smokeless tobacco: Never Used  . Alcohol Use: No   OB History    No  data available     Review of Systems  Cannot perform review systems level V caveat   Allergies  Penicillins  Home Medications   Prior to Admission medications   Medication Sig Start Date End Date Taking? Authorizing Provider  acetaminophen (TYLENOL) 325 MG tablet Take 650 mg by mouth every 4 (four) hours as needed for moderate pain.   Yes Historical Provider, MD  amLODipine (NORVASC) 5 MG tablet Take one tablet by mouth once daily for hypertension 08/21/14  Yes Historical Provider, MD  aspirin EC 81 MG tablet Take one tablet by mouth once daily for AFIB   Yes Historical Provider, MD  cholecalciferol (VITAMIN D) 1000 UNITS tablet Take one tablet by mouth once daily for vitamin D deficiency   Yes Historical Provider, MD  Fluticasone-Salmeterol (ADVAIR) 250-50 MCG/DOSE AEPB Inhale one puff into the lungs twice daily for SOB/Wheeze. Rinse mouth after use.   Yes Historical Provider, MD  ipratropium-albuterol (DUONEB) 0.5-2.5 (3) MG/3ML SOLN Inhale 3 mLs into the lungs every 8 (eight) hours as needed. Shortness of breath and wheezing 06/27/14  Yes Historical Provider, MD  isosorbide mononitrate (IMDUR) 30 MG 24 hr tablet Take one tablet by mouth every morning for hypertension   Yes Historical Provider, MD  losartan (COZAAR) 100 MG tablet Take one tablet by mouth once daily for hypertension   Yes  Historical Provider, MD  memantine (NAMENDA XR) 28 MG CP24 24 hr capsule Take 28 mg by mouth daily.   Yes Historical Provider, MD  metoprolol (LOPRESSOR) 50 MG tablet Take one tablet by mouth twice daily for hypertension   Yes Historical Provider, MD  mirtazapine (REMERON) 7.5 MG tablet Take 7.5 mg by mouth at bedtime.   Yes Historical Provider, MD  montelukast (SINGULAIR) 10 MG tablet Take one tablet by mouth every night at bedtime for allergies   Yes Historical Provider, MD  potassium chloride SA (K-DUR,KLOR-CON) 20 MEQ tablet Take one tablet by mouth once daily with food for hypokalemia   Yes Historical  Provider, MD  simvastatin (ZOCOR) 20 MG tablet Take 30 mg by mouth daily.    Yes Historical Provider, MD  albuterol (PROVENTIL HFA;VENTOLIN HFA) 108 (90 BASE) MCG/ACT inhaler Inhale 2 puffs into the lungs every 6 (six) hours as needed for wheezing or shortness of breath. 06/19/14   Nita Sells, MD   BP 195/95 mmHg  Pulse 63  Temp(Src) 97.8 F (36.6 C)  Resp 22  SpO2 96% Physical Exam  Constitutional:  A she is alert nontoxic. She is holding her jaw open and intermittently crying out with pain. No respiratory distress.  HENT:  Head: Normocephalic and atraumatic.  Right Ear: External ear normal.  Left Ear: External ear normal.  Mouth/Throat: Oropharynx is clear and moist.  To command patient can rotate her jaw somewhat for tobacco but cannot close her mouth with the jaw and a retracted position.  Eyes: EOM are normal. Pupils are equal, round, and reactive to light.  Neck: Neck supple.  Cardiovascular: Normal rate and normal heart sounds.   Patient has a paced rhythm on the monitor.  Pulmonary/Chest: Effort normal and breath sounds normal.  Abdominal: Soft.  Neurological: She is alert. She exhibits normal muscle tone. Coordination normal.  Skin: Skin is warm and dry.  Psychiatric: She has a normal mood and affect.    ED Course  .Sedation Date/Time: 03/16/2015 12:30 PM Performed by: Charlesetta Shanks Authorized by: Charlesetta Shanks  Consent:    Consent obtained:  Emergent situation   Consent given by:  Patient Indications:    Sedation purpose:  Dislocation reduction   Procedure necessitating sedation performed by:  Physician performing sedation   Intended level of sedation:  Moderate (conscious sedation) Pre-sedation assessment:    NPO status caution: unable to specify NPO status     ASA classification: class 3 - patient with severe systemic disease     Neck mobility: reduced     Mouth opening:  2 finger widths   Thyromental distance:  3 finger widths   Mallampati  score:  I - soft palate, uvula, fauces, pillars visible   Pre-sedation assessments completed and reviewed: airway patency, cardiovascular function, hydration status, mental status, nausea/vomiting, pain level, respiratory function and temperature   Immediate pre-procedure details:    Reviewed: vital signs and relevant labs/tests     Verified: bag valve mask available, emergency equipment available, intubation equipment available, IV patency confirmed, oxygen available and reversal medications available   Procedure details (see MAR for exact dosages):    Preoxygenation:  Room air   Sedation:  Propofol   Intra-procedure monitoring:  Blood pressure monitoring, cardiac monitor, continuous capnometry, continuous pulse oximetry, frequent LOC assessments and frequent vital sign checks   Intra-procedure events: none   Post-procedure details:    Post-sedation assessments completed and reviewed: airway patency, cardiovascular function, hydration status, mental status, nausea/vomiting, pain level, respiratory  function and temperature     Patient is stable for discharge or admission: Yes     Patient tolerance:  Tolerated well, no immediate complications  (including critical care time) Dislocation reduction: Once patient was sedated jaw was easily relocated with manual downward traction and posterior positioning. Labs Review Labs Reviewed - No data to display  Imaging Review Dg Mandible 4 Views  03/16/2015  CLINICAL DATA:  Jaw dislocation.  Status post reduction. EXAM: MANDIBLE - 4+ VIEW COMPARISON:  None. FINDINGS: Possible nondisplaced fracture of the right mandibular condyle. Evaluation for dislocation is limited. No aggressive osseous lesion. IMPRESSION: 1. Overall limited evaluation. Possible nondisplaced fracture of the right mandibular condyle. Electronically Signed   By: Kathreen Devoid   On: 03/16/2015 13:28   Ct Maxillofacial Wo Cm  03/16/2015  CLINICAL DATA:  Jaw pain, possible dislocation EXAM:  CT MAXILLOFACIAL WITHOUT CONTRAST TECHNIQUE: Multidetector CT imaging of the maxillofacial structures was performed. Multiplanar CT image reconstructions were also generated. A small metallic BB was placed on the right temple in order to reliably differentiate right from left. COMPARISON:  None. FINDINGS: The examination is severely limited secondary to patient motion which especially degrades the evaluation of the mandible. The globes are intact. The orbital walls are intact. The orbital floors are intact. The maxilla is intact. The zygomatic arches are intact. The nasal septum is midline. There is no nasal bone fracture. The temporomandibular joints are normal. There is mild mucosal thickening of the right maxillary sinus. The visualized portions of the mastoid sinuses are well aerated. IMPRESSION: Examination is severely limited secondary to patient motion which especially degrades the evaluation of the mandible. If there is further clinical concern regarding mandibular injury, recommend repeat of the CT of the maxillofacial bones with sedation. No TMJ dislocation. Electronically Signed   By: Kathreen Devoid   On: 03/16/2015 15:08   I have personally reviewed and evaluated these images and lab results as part of my medical decision-making.   EKG Interpretation None      MDM   Final diagnoses:  Dislocation of mandible, initial encounter   Patient has had problems with recurrent jaw dislocation. She was sent from nursing home without detail of mechanism. I did attempt to relocate without sedation with downward pressure however the patient could not tolerate this and thus propofol was used for sedation. With sedation this was easily reduced. This reduction x-ray raised the question of mandibular fracture and thus CT was ordered. CT shows reduction in no obvious mandibular fracture. There is low suspicion for fracture. This is been a more or less spontaneous occurrence. The patient on multiple prior  episodes of this time I see no evidence of immediate trauma. Once the mandible was reduced, the patient is been calm without signs of pain.    Charlesetta Shanks, MD 03/16/15 [phone]

## 2015-03-16 NOTE — Discharge Instructions (Signed)
Jaw Dislocation A jaw dislocation is the displacement of the joint where the upper jaw bone (maxilla) and the lower jaw bone (mandibula) meet (temporomandibular joint). Soon after the dislocation, the jaw muscles tighten. This prevents the mouth from closing normally.  CAUSES A jaw dislocation usually is caused by a sudden forceful impact to the jaw. A strong punch in the jaw during a fist fight or a sports injury are examples of causes of jaw dislocation. Another cause is injury due to car or motorcycle accidents. RISK FACTORS Although anyone can have a jaw dislocation, some people are more at risk than others. People at increased risk for jaw dislocation include participants in contact sports. SYMPTOMS Symptoms of jaw dislocation can vary, depending on the severity of the dislocation. They can include:  Feeling that your teeth are out of alignment when you bite.  Inability to close your mouth completely.  Drooling.  Extreme pain, with the inability to move your jaw. DIAGNOSIS Your caregiver will feel your temporomandibular joints and ask you to move your jaw. Your caregiver also will feel the inside of your mouth to make sure there are no fractures or cuts (lacerations). TREATMENT Your caregiver will manipulate your jaw to put it back into place (reduction). If you have any jaw fractures from the dislocation, they usually will be held in place with plates and screws or with wiring.  HOME CARE INSTRUCTIONS The following measures can help to reduce pain and hasten the healing process:  Rest your injured joint. Do not move it. Avoid activities similar to the one that caused your injury.  Apply ice to your injured joint for 1 to 2 days after your reduction or as directed by your caregiver. Applying ice helps to reduce inflammation and pain.  Put ice in a plastic bag.  Place a towel between your skin and the bag.  Leave the ice on for 15-20 minutes at a time, 03-04 times a day.  Take  over-the-counter or prescription medication for pain as directed by your caregiver. Also, your caregiver may instruct you to only have certain foods until your jaw heals. These foods may be soft or liquified so that your jaw does not have to move much to eat them. SEEK IMMEDIATE MEDICAL CARE IF:  You have plates and screws or wiring to hold your jaw together that becomes loose or damaged.  You develop drainage from any of the cuts (incisions) where your wires or plates and screws were placed.  Your pain becomes worse rather than better. MAKE SURE YOU:  Understand these instructions.  Will watch your condition.  Will get help right away if you are not doing well or get worse.   This information is not intended to replace advice given to you by your health care provider. Make sure you discuss any questions you have with your health care provider.   Document Released: 02/05/2000 Document Revised: 05/02/2011 Document Reviewed: 07/07/2014 Elsevier Interactive Patient Education [employer].

## 2015-03-16 NOTE — ED Notes (Signed)
Pt walked on portable Pulse Ox pt maintained at 98% O2 stats with a heart heart of 98

## 2015-03-16 NOTE — Progress Notes (Signed)
RT at bedside for conscious sedation. Pt tolerated well, all vital signs within normal limits.

## 2015-03-16 NOTE — ED Notes (Signed)
Pt arrived via EMS from [facility] with report of lower jaw pain with possible dislocation. Pt unable to close mouth completely. Pt was seen here Friday for same problem.

## 2015-03-16 NOTE — ED Notes (Signed)
Patient yelling out in pain. Patient pointing to her head and jaw, when asked what hurts. Patient calms down with staff in the room.

## 2015-03-17 ENCOUNTER — Non-Acute Institutional Stay (SKILLED_NURSING_FACILITY): Payer: Medicare Other | Admitting: Family

## 2015-03-17 DIAGNOSIS — S0300XD Dislocation of jaw, unspecified side, subsequent encounter: Secondary | ICD-10-CM

## 2015-03-17 DIAGNOSIS — E039 Hypothyroidism, unspecified: Secondary | ICD-10-CM

## 2015-03-17 DIAGNOSIS — I10 Essential (primary) hypertension: Secondary | ICD-10-CM

## 2015-03-17 DIAGNOSIS — E43 Unspecified severe protein-calorie malnutrition: Secondary | ICD-10-CM

## 2015-03-17 NOTE — Progress Notes (Signed)
Patient ID: Georgea Whetsell, female   DOB: Nov 09, 1929, 82 y.o.   MRN: [id number]  Location:  [facility]  Provider: Blanchie Serve, MD  Code Status: DNR Goals of care: Advanced Directive information Advanced Directives 03/16/2015  Does patient have an advance directive? Yes  Type of Advance Directive Out of facility DNR (pink MOST or yellow form)  Does patient want to make changes to advanced directive? No - Patient declined  Copy of advanced directive(s) in chart? Yes  Would patient like information on creating an advanced directive? -  Pre-existing out of facility DNR order (yellow form or pink MOST form) Yellow form placed in chart (order not valid for inpatient use)     Chief Complaint  Patient presents with   Acute Visit    HPI:  Pt is a 82 y.o. female seen today at [facility] for medical management of chronic diseases. She has a past medical history of tachybradycardia syndrome, Hypothyroidism, AFib, pacemaker, asthma, hyperlipidemia, anxiety, chronic kidney disease, Jaw dislocation and among others. She was seen 03/16/2015 in the ED for jaw pain. Seen today in her room.She states pain had improved today. She denies any acute issues. Facility staff reports no concerns.    Review of Systems  Constitutional: Negative.   HENT: Negative.   Eyes: Negative.   Respiratory: Negative.   Cardiovascular: Negative.   Gastrointestinal: Negative.   Genitourinary: Negative.   Musculoskeletal: Negative.   Skin: Negative.   Neurological: Negative.   Endo/Heme/Allergies: Negative.   Psychiatric/Behavioral: Negative.     Past Medical History  Diagnosis Date   Tachycardia-bradycardia syndrome [facility])     s/p [employer] PPM implant in [place]   Paroxysmal atrial fibrillation (HCC)     chads2vasc score is at least 6.  She is not a candidate for anticoagulation due to falls.  Her AF burden is low.   Hypertension    Frequent falls      Asthma    Hyperlipidemia    Depression    Anxiety    Pacemaker    Stroke [facility])    Osteoporosis 06/23/2013   Vitamin D deficiency    Sick sinus syndrome ([place])    Hypothyroidism    Chronic kidney disease    Fracture of greater trochanter of left femur ([place]) 04/02/2013   Hip fracture, left ([place]) 04/02/2013   Pelvic fracture (HCC) 06/23/2013   Senile osteoporosis 02/26/2014   Rhinitis, allergic 02/26/2014   TMJ (dislocation of temporomandibular joint) 06/16/2014   Past Surgical History  Procedure Laterality Date   Pacemaker insertion  01/04/2005    [employer] [place] PPM 1290 ([place] [id number]), GDT 4480 atrial lead and 4457 V lead all implanted in [place]   Cholecystectomy     Gallbladder surgery     Tonsillectomy and adenoidectomy      Allergies  Allergen Reactions   Penicillins Other (See Comments)    Red spots ### Tolerated Rocephin 12/2012 ###      Medication List       This list is accurate as of: 03/17/15  9:45 PM.  Always use your most recent med list.               acetaminophen 325 MG tablet  Commonly known as:  TYLENOL  Take 650 mg by mouth every 4 (four) hours as needed for moderate pain.     albuterol 108 (90 Base) MCG/ACT inhaler  Commonly known as:  PROVENTIL HFA;VENTOLIN HFA  Inhale 2 puffs into  the lungs every 6 (six) hours as needed for wheezing or shortness of breath.     amLODipine 5 MG tablet  Commonly known as:  NORVASC  Take one tablet by mouth once daily for hypertension     aspirin EC 81 MG tablet  Take one tablet by mouth once daily for AFIB     cholecalciferol 1000 units tablet  Commonly known as:  VITAMIN D  Take one tablet by mouth once daily for vitamin D deficiency     Fluticasone-Salmeterol 250-50 MCG/DOSE Aepb  Commonly known as:  ADVAIR  Inhale one puff into the lungs twice daily for SOB/Wheeze. Rinse mouth after use.     ipratropium-albuterol 0.5-2.5 (3) MG/3ML Soln  Commonly known as:  DUONEB  Inhale 3 mLs  into the lungs every 8 (eight) hours as needed. Shortness of breath and wheezing     isosorbide mononitrate 30 MG 24 hr tablet  Commonly known as:  IMDUR  Take one tablet by mouth every morning for hypertension     losartan 100 MG tablet  Commonly known as:  COZAAR  Take one tablet by mouth once daily for hypertension     metoprolol 50 MG tablet  Commonly known as:  LOPRESSOR  Take one tablet by mouth twice daily for hypertension     mirtazapine 7.5 MG tablet  Commonly known as:  REMERON  Take 7.5 mg by mouth at bedtime.     montelukast 10 MG tablet  Commonly known as:  SINGULAIR  Take one tablet by mouth every night at bedtime for allergies     NAMENDA XR 28 MG Cp24 24 hr capsule  Generic drug:  memantine  Take 28 mg by mouth daily.     potassium chloride SA 20 MEQ tablet  Commonly known as:  K-DUR,KLOR-CON  Take one tablet by mouth once daily with food for hypokalemia     simvastatin 20 MG tablet  Commonly known as:  ZOCOR  Take 30 mg by mouth daily.        Immunization History  Administered Date(s) Administered   Influenza-Unspecified 11/28/2013, 12/03/2014   PPD Test 01/03/2013, 01/18/2013, 04/19/2013, 06/25/2014   Pneumococcal-Unspecified 02/01/2013   Pertinent  Health Maintenance Due  Topic Date Due   DEXA SCAN  10/04/1996   PNA vac Low Risk Adult (2 of 2 - PCV13) 02/01/2014   INFLUENZA VACCINE  09/22/2015   Fall Risk  12/10/2010  Risk for fall due to : History of fall(s);Impaired balance/gait    Filed Vitals:   03/17/15 2116  BP: 132/78  Pulse: 82  Temp: 97.5 F (36.4 C)  Resp: 20  Weight: 112 lb 3.2 oz (50.894 kg)  SpO2: 97%   Body mass index is 18.67 kg/(m^2). Physical Exam  Constitutional: She is oriented to person, place, and time.  Frail Elderly in no acute distress.  HENT:  Head: Normocephalic.  Mouth/Throat: Oropharynx is clear and moist.  Eyes: Conjunctivae and EOM are normal. Pupils are equal, round, and reactive to light.  Right eye exhibits no discharge. Left eye exhibits no discharge. No scleral icterus.  Neck: Normal range of motion.  Cardiovascular: Normal rate.  Exam reveals no gallop and no friction rub.   No murmur heard. Pulmonary/Chest: Effort normal and breath sounds normal.  Abdominal: Soft. Bowel sounds are normal. She exhibits mass. She exhibits no distension. There is no tenderness. There is no rebound and no guarding.  Musculoskeletal: Normal range of motion. She exhibits no edema or tenderness.  Left side  weakness   Neurological: She is oriented to person, place, and time.  Skin: Skin is warm and dry.  Psychiatric: She has a normal mood and affect.    Labs reviewed:  Recent Labs  06/20/14 1633 06/21/14 0507 03/12/15 1330  NA 139 137 143  K 3.3* 3.7 4.2  CL 101 98 104  CO2 31 29 33*  GLUCOSE 120* 219* 103*  BUN 33* 34* 14  CREATININE 0.60 0.76 0.60  CALCIUM 9.2 8.9 9.3    Recent Labs  04/30/14 1548 06/19/14 0418 06/21/14 0507  AST 15 19 20   ALT 14 21 30   ALKPHOS 61 68 62  BILITOT 0.7 0.5 0.4  PROT 5.9* 7.1 6.2  ALBUMIN 3.1* 3.6 2.9*    Recent Labs  06/19/14 0418 06/20/14 1633 06/21/14 0507 03/12/15 1330  WBC 12.7* 14.3* 9.2 9.3  NEUTROABS 9.7* 8.7* 8.1*  --   HGB 15.0 16.0* 14.4 14.5  HCT 45.5 50.3* 45.2 46.0  MCV 87.3 88.6 88.1 90.7  PLT 335 381 327 281   Lab Results  Component Value Date   TSH 0.488 06/16/2014   Lab Results  Component Value Date   HGBA1C 6.2* 06/17/2014   Lab Results  Component Value Date   LDLDIRECT 83 12/10/2010    Significant Diagnostic Results in last 30 days:  Dg Mandible 1-3 Views  03/12/2015  CLINICAL DATA:  82 year old female status post reduction of mandible dislocation. EXAM: MANDIBLE - 1-3 VIEW FINDINGS: No lateral views are submitted for evaluation. On the 2 frontal projections the mandible appears grossly intact. IMPRESSION: 1. Limited examination demonstrating no acute abnormality of the mandible. Electronically  Signed   By: Vinnie Langton M.D.   On: 03/12/2015 16:12   Dg Mandible 4 Views  03/16/2015  CLINICAL DATA:  Jaw dislocation.  Status post reduction. EXAM: MANDIBLE - 4+ VIEW COMPARISON:  None. FINDINGS: Possible nondisplaced fracture of the right mandibular condyle. Evaluation for dislocation is limited. No aggressive osseous lesion. IMPRESSION: 1. Overall limited evaluation. Possible nondisplaced fracture of the right mandibular condyle. Electronically Signed   By: Kathreen Devoid   On: 03/16/2015 13:28   Ct Maxillofacial Wo Cm  03/16/2015  CLINICAL DATA:  Jaw pain, possible dislocation EXAM: CT MAXILLOFACIAL WITHOUT CONTRAST TECHNIQUE: Multidetector CT imaging of the maxillofacial structures was performed. Multiplanar CT image reconstructions were also generated. A small metallic BB was placed on the right temple in order to reliably differentiate right from left. COMPARISON:  None. FINDINGS: The examination is severely limited secondary to patient motion which especially degrades the evaluation of the mandible. The globes are intact. The orbital walls are intact. The orbital floors are intact. The maxilla is intact. The zygomatic arches are intact. The nasal septum is midline. There is no nasal bone fracture. The temporomandibular joints are normal. There is mild mucosal thickening of the right maxillary sinus. The visualized portions of the mastoid sinuses are well aerated. IMPRESSION: Examination is severely limited secondary to patient motion which especially degrades the evaluation of the mandible. If there is further clinical concern regarding mandibular injury, recommend repeat of the CT of the maxillofacial bones with sedation. No TMJ dislocation. Electronically Signed   By: Kathreen Devoid   On: 03/16/2015 15:08    Assessment/Plan 1. Essential hypertension B/p Stable. Continue on current medication and Monitor Bmp.   2. Protein-calorie malnutrition, severe (HCC) Weight stable. Will continue  Remeron  15mg  Tablet.   3. TMJ (dislocation of temporomandibular joint), subsequent encounter Evaluated in ED 03/16/2015. Pain  has improved today. Continue to monitor. 4. Hypothyroidism Ordering TSH level this visit.     Family/ staff Communication: Reviewed plan with patient and facility staff.   Labs/tests ordered: CBC/diff, TSH level 03/18/2015

## 2015-03-18 LAB — CBC AND DIFFERENTIAL
HCT: 46 % (ref 36–46)
Hemoglobin: 14.1 g/dL (ref 12.0–16.0)
Platelets: 280 10*3/uL (ref 150–399)
WBC: 9.1 10*3/mL

## 2015-03-18 LAB — TSH: TSH: 0.57 u[IU]/mL (ref 0.41–5.90)

## 2015-03-23 ENCOUNTER — Encounter: Payer: Self-pay | Admitting: Internal Medicine

## 2015-03-23 ENCOUNTER — Ambulatory Visit (INDEPENDENT_AMBULATORY_CARE_PROVIDER_SITE_OTHER): Payer: Medicare Other | Admitting: Internal Medicine

## 2015-03-23 VITALS — BP 182/79 | HR 64 | Ht 66.0 in | Wt 112.0 lb

## 2015-03-23 DIAGNOSIS — I495 Sick sinus syndrome: Secondary | ICD-10-CM

## 2015-03-23 DIAGNOSIS — I480 Paroxysmal atrial fibrillation: Secondary | ICD-10-CM | POA: Diagnosis not present

## 2015-03-23 LAB — CUP PACEART INCLINIC DEVICE CHECK
Date Time Interrogation Session: 20170130050000
Implantable Lead Implant Date: 20061114
Implantable Lead Implant Date: 20061114
Implantable Lead Location: 753857
Implantable Lead Model: 4457
Implantable Lead Serial Number: 520676
Lead Channel Impedance Value: 410 Ohm
Lead Channel Pacing Threshold Amplitude: 0.6 V
Lead Channel Pacing Threshold Pulse Width: 0.5 ms
Lead Channel Sensing Intrinsic Amplitude: 6 mV
Lead Channel Setting Pacing Amplitude: 2 V
Lead Channel Setting Sensing Sensitivity: 1 mV
MDC IDC LEAD LOCATION: 753860
MDC IDC LEAD MODEL: 4480
MDC IDC LEAD SERIAL: 470623
MDC IDC MSMT LEADCHNL RA IMPEDANCE VALUE: 520 Ohm
MDC IDC MSMT LEADCHNL RA PACING THRESHOLD AMPLITUDE: 0.4 V
MDC IDC MSMT LEADCHNL RA PACING THRESHOLD PULSEWIDTH: 0.5 ms
MDC IDC MSMT LEADCHNL RA SENSING INTR AMPL: 3 mV — AB
MDC IDC PG SERIAL: 764926
MDC IDC SET LEADCHNL RV PACING AMPLITUDE: 2.4 V
MDC IDC SET LEADCHNL RV PACING PULSEWIDTH: 0.5 ms
MDC IDC STAT BRADY RA PERCENT PACED: 36 %
MDC IDC STAT BRADY RV PERCENT PACED: 2 %
Pulse Gen Model: 1290

## 2015-03-23 NOTE — Patient Instructions (Signed)
Medication Instructions:  Your physician recommends that you continue on your current medications as directed. Please refer to the Current Medication list given to you today.   Labwork: None ordered   Testing/Procedures: None ordered   Follow-Up: Your physician recommends that you schedule a follow-up appointment in: 3 months in device clinic on a Monday when [facility]red is here   Any Other Special Instructions Will Be Listed Below (If Applicable).     If you need a refill on your cardiac medications before your next appointment, please call your pharmacy.

## 2015-03-23 NOTE — Progress Notes (Signed)
Electrophysiology Office Note   Date:  03/23/2015   ID:  Yvonne Buck, DOB 1929/09/21, MRN [id number]  PCP:  Blanchie Serve, MD  Primary Electrophysiologist: Thompson Grayer, MD    Chief Complaint  Patient presents with  . Atrial Fibrillation     History of Present Illness: Yvonne Buck is a 82 y.o. female who presents today for electrophysiology evaluation.   She presents for routine device follow-up.  She denies exertional CP.  She has stable SOB.  She is chronically ill and has difficulty providing additional history today.  Wearing o2.   Past Medical History  Diagnosis Date  . Tachycardia-bradycardia syndrome [facility])     s/p [employer] PPM implant in [place]  . Paroxysmal atrial fibrillation (HCC)     chads2vasc score is at least 6.  She is not a candidate for anticoagulation due to falls.  Her AF burden is low.  . Hypertension   . Frequent falls   . Asthma   . Hyperlipidemia   . Depression   . Anxiety   . Pacemaker   . Stroke ([place])   . Osteoporosis 06/23/2013  . Vitamin D deficiency   . Sick sinus syndrome ([place])   . Hypothyroidism   . Chronic kidney disease   . Fracture of greater trochanter of left femur ([place]) 04/02/2013  . Hip fracture, left ([place]) 04/02/2013  . Pelvic fracture ([place]) 06/23/2013  . Senile osteoporosis 02/26/2014  . Rhinitis, allergic 02/26/2014  . TMJ (dislocation of temporomandibular joint) 06/16/2014   Past Surgical History  Procedure Laterality Date  . Pacemaker insertion  01/04/2005    [employer] PPM 1290 [phone]), GDT 201 [id number] 0787 atrial lead and 4457 V lead all implanted in [place]  . Cholecystectomy    . Gallbladder surgery    . Tonsillectomy and adenoidectomy       Current Outpatient Prescriptions  Medication Sig Dispense Refill  . acetaminophen (TYLENOL) 325 MG tablet Take 650 mg by mouth every 4 (four) hours as needed for moderate pain.    Mar[place] Kitchen amLODipine (NORVASC) 5 MG tablet Take one tablet by mouth once daily for hypertension      . aspirin EC 81 MG tablet Take one tablet by mouth once daily for AFIB    . cholecalciferol (VITAMIN D) 1000 UNITS tablet Take one tablet by mouth once daily for vitamin D deficiency    . Fluticasone-Salmeterol (ADVAIR) 250-50 MCG/DOSE AEPB Inhale one puff into the lungs twice daily for SOB/Wheeze. Rinse mouth after use.    . ipratropium-albuterol (DUONEB) 0.5-2.5 (3) MG/3ML SOLN Inhale 3 mLs into the lungs every 8 (eight) hours as needed. Shortness of breath and wheezing    . isosorbide mononitrate (IMDUR) 30 MG 24 hr tablet Take one tablet by mouth every morning for hypertension    . losartan (COZAAR) 100 MG tablet Take one tablet by mouth once daily for hypertension    . memantine (NAMENDA XR) 28 MG CP24 24 hr capsule Take 28 mg by mouth daily.    . metoprolol (LOPRESSOR) 50 MG tablet Take one tablet by mouth twice daily for hypertension    . mirtazapine (REMERON) 7.5 MG tablet Take 7.5 mg by mouth at bedtime.    . montelukast (SINGULAIR) 10 MG tablet Take one tablet by mouth every night at bedtime for allergies    . potassium chloride SA (K-DUR,KLOR-CON) 20 MEQ tablet Take one tablet by mouth once daily with food for hypokalemia    . simvastatin (ZOCOR) 20 MG tablet Take 30  mg by mouth daily.      No current facility-administered medications for this visit.    Allergies:   Penicillins   Social History:  The patient  reports that she has never smoked. She has never used smokeless tobacco. She reports that she does not drink alcohol or use illicit drugs.   Family History:  The patient's family history includes Diabetes in her mother; Heart Problems in her father; High blood pressure in her mother.    PHYSICAL EXAM: VS:  BP 182/79 mmHg  Pulse 64  Ht 5\' 6"  (1.676 m)  Wt 112 lb (50.803 kg)  BMI 18.09 kg/m2 , BMI Body mass index is 18.09 kg/(m^2). GEN: chronically ill appearing today, in no acute distress HEENT: decreased hearing Neck: no JVD, carotid bruits, or masses Cardiac: RRR;  no murmurs, rubs, or gallops,no edema  Respiratory:  clear to auscultation bilaterally, normal work of breathing GI: soft, nontender, nondistended, + BS MS: in a rolling chair today Skin: warm and dry, device pocket is well healed Neuro:  Strength and sensation are intact Psych: euthymic mood, full affect  Device interrogation is reviewed today in detail.  See PaceArt for details.   Recent Labs: 06/16/2014: B Natriuretic Peptide 125.6*; TSH 0.488 06/21/2014: ALT 30 03/12/2015: BUN 14; Creatinine, Ser 0.60; Hemoglobin 14.5; Platelets 281; Potassium 4.2; Sodium 143    Lipid Panel     Component Value Date/Time   LDLDIRECT 83 12/10/2010 1018     Wt Readings from Last 3 Encounters:  03/23/15 112 lb (50.803 kg)  03/17/15 112 lb 3.2 oz (50.894 kg)  03/12/15 112 lb (50.803 kg)    ASSESSMENT AND PLAN:  1.  Sick sinus syndrome Normal pacemaker function See Yvonne Buck Art report No changes today <6 months estimated battery longevity Will return for battery check in device clinic in 14months  2. afib Well controlled  chads2vasc score is at least 6.  She is not a candidate for anticoagulation due to falls.  Her AF burden is low.  3. HTN Stable No change required today   Signed, Thompson Grayer, MD  03/23/2015 4:48 PM     [address] [place] [address] [phone] (office) [phone] (fax)

## 2015-03-26 ENCOUNTER — Encounter: Payer: Self-pay | Admitting: Internal Medicine

## 2015-03-26 ENCOUNTER — Non-Acute Institutional Stay (SKILLED_NURSING_FACILITY): Payer: Medicare Other | Admitting: Internal Medicine

## 2015-03-26 DIAGNOSIS — I10 Essential (primary) hypertension: Secondary | ICD-10-CM

## 2015-03-26 DIAGNOSIS — E43 Unspecified severe protein-calorie malnutrition: Secondary | ICD-10-CM

## 2015-03-26 DIAGNOSIS — I480 Paroxysmal atrial fibrillation: Secondary | ICD-10-CM

## 2015-03-26 NOTE — Progress Notes (Signed)
Patient ID: Yvonne Buck, female   DOB: Jul 03, 1929, 82 y.o.   MRN: [id number]        [facility]  Code Status: DNR  Chief Complaint  Patient presents with  . Medical Management of Chronic Issues    Routine Visit    Allergies  Allergen Reactions  . Penicillins Other (See Comments)    Red spots ### Tolerated Rocephin 12/2012 ###    HPI:   82 y/o female patient is seen today for routine visit. She is on a geri chair. Had a visit to the ED last month for jaw dislocation. She has dementia. Denies any concerns. No new concern from staff. No falls reported. No skin breakdown. No acute behavioral change.    Review of Systems  Constitutional: Negative.  Negative for fever.  HENT: Negative.  Negative for congestion.   Eyes: Negative.  Negative for blurred vision.  Respiratory: Negative.  Negative for cough.   Cardiovascular: Negative.  Negative for chest pain and palpitations.  Gastrointestinal: Negative.  Negative for heartburn.  Genitourinary: Negative.   Musculoskeletal: Negative.   Skin: Negative.  Negative for rash.  Neurological: Negative.  Negative for dizziness and headaches.  Endo/Heme/Allergies: Negative.   Psychiatric/Behavioral: Negative.     Past Medical History  Diagnosis Date  . Tachycardia-bradycardia syndrome [facility])     s/p Chemical engineer PPM implant in [place]  . Paroxysmal atrial fibrillation (HCC)     chads2vasc score is at least 6.  She is not a candidate for anticoagulation due to falls.  Her AF burden is low.  . Hypertension   . Frequent falls   . Asthma   . Hyperlipidemia   . Depression   . Anxiety   . Pacemaker   . Stroke ([place])   . Osteoporosis 06/23/2013  . Vitamin D deficiency   . Sick sinus syndrome ([place])   . Hypothyroidism   . Chronic kidney disease   . Fracture of greater trochanter of left femur ([place]) 04/02/2013  . Hip fracture, left ([place]) 04/02/2013  . Pelvic fracture ([place]) 06/23/2013  . Senile osteoporosis 02/26/2014  .  Rhinitis, allergic 02/26/2014  . TMJ (dislocation of temporomandibular joint) 06/16/2014      Medication List       This list is accurate as of: 03/26/15  4:49 PM.  Always use your most recent med list.               acetaminophen 325 MG tablet  Commonly known as:  TYLENOL  Take 650 mg by mouth every 4 (four) hours as needed for moderate pain.     amLODipine 5 MG tablet  Commonly known as:  NORVASC  Take one tablet by mouth once daily for hypertension     aspirin EC 81 MG tablet  Take one tablet by mouth once daily for AFIB     cholecalciferol 1000 units tablet  Commonly known as:  VITAMIN D  Take one tablet by mouth once daily for vitamin D deficiency     Fluticasone-Salmeterol 250-50 MCG/DOSE Aepb  Commonly known as:  ADVAIR  Inhale one puff into the lungs twice daily for SOB/Wheeze. Rinse mouth after use.     ipratropium-albuterol 0.5-2.5 (3) MG/3ML Soln  Commonly known as:  DUONEB  Inhale 3 mLs into the lungs every 8 (eight) hours as needed. Shortness of breath and wheezing     isosorbide mononitrate 30 MG 24 hr tablet  Commonly known as:  IMDUR  Take one tablet by mouth  every morning for hypertension     losartan 100 MG tablet  Commonly known as:  COZAAR  Take one tablet by mouth once daily for hypertension     metoprolol 50 MG tablet  Commonly known as:  LOPRESSOR  Take one tablet by mouth twice daily for hypertension     mirtazapine 7.5 MG tablet  Commonly known as:  REMERON  Take 7.5 mg by mouth at bedtime.     montelukast 10 MG tablet  Commonly known as:  SINGULAIR  Take one tablet by mouth every night at bedtime for allergies     NAMENDA XR 28 MG Cp24 24 hr capsule  Generic drug:  memantine  Take 28 mg by mouth daily.     OXYGEN  Inhale 2 mLs into the lungs.     potassium chloride SA 20 MEQ tablet  Commonly known as:  K-DUR,KLOR-CON  Take one tablet by mouth once daily with food for hypokalemia     simvastatin 20 MG tablet  Commonly known as:   ZOCOR  Take 30 mg by mouth daily.        Immunization History  Administered Date(s) Administered  . Influenza-Unspecified 11/28/2013, 12/03/2014  . PPD Test 01/03/2013, 01/18/2013, 04/19/2013, 06/25/2014, 10/29/2014  . Pneumococcal-Unspecified 02/01/2013   Pertinent  Health Maintenance Due  Topic Date Due  . DEXA SCAN  02/22/2016 (Originally 10/04/1996)  . PNA vac Low Risk Adult (2 of 2 - PCV13) 02/22/2016 (Originally 02/01/2014)  . INFLUENZA VACCINE  09/22/2015    Filed Vitals:   03/26/15 1642  BP: 143/83  Temp: 98.6 F (37 C)  TempSrc: Oral  Resp: 79  Height: 5\' 6"  (1.676 m)  SpO2: 95%   There is no weight on file to calculate BMI. Physical Exam  Constitutional:  Frail Elderly in no acute distress.  HENT:  Head: Normocephalic.  Mouth/Throat: Oropharynx is clear and moist.  Eyes: Conjunctivae and EOM are normal. Pupils are equal, round, and reactive to light. Right eye exhibits no discharge. Left eye exhibits no discharge. No scleral icterus.  Neck: Normal range of motion.  Cardiovascular: Normal rate.  Exam reveals no gallop and no friction rub.   No murmur heard. Pulmonary/Chest: Effort normal and breath sounds normal.  Abdominal: Soft. Bowel sounds are normal. She exhibits no distension and no mass. There is no tenderness. There is no rebound and no guarding.  Musculoskeletal: Normal range of motion. She exhibits no edema or tenderness.  Left side weakness   Neurological:  Alert but pleasantly confused  Skin: Skin is warm and dry.  Psychiatric:  Poor insight and judgement    Labs reviewed:   Recent Labs  06/20/14 1633 06/21/14 0507 03/12/15 1330  NA 139 137 143  K 3.3* 3.7 4.2  CL 101 98 104  CO2 31 29 33*  GLUCOSE 120* 219* 103*  BUN 33* 34* 14  CREATININE 0.60 0.76 0.60  CALCIUM 9.2 8.9 9.3    Recent Labs  04/30/14 1548 06/19/14 0418 06/21/14 0507  AST 15 19 20   ALT 14 21 30   ALKPHOS 61 68 62  BILITOT 0.7 0.5 0.4  PROT 5.9* 7.1 6.2    ALBUMIN 3.1* 3.6 2.9*    Recent Labs  06/19/14 0418 06/20/14 1633 06/21/14 0507 03/12/15 1330  WBC 12.7* 14.3* 9.2 9.3  NEUTROABS 9.7* 8.7* 8.1*  --   HGB 15.0 16.0* 14.4 14.5  HCT 45.5 50.3* 45.2 46.0  MCV 87.3 88.6 88.1 90.7  PLT 335 381 327 281  03/18/15 tsh 0.57   Assessment/plan  afib Rate controlled. Continue lopressor for now with statin. Continue baby aspirin  Protein calorie malnutrition Continue fortified food, followed by dietary team. Continue remeron for now. Monitor weight.  Essential HTN Continue norvasc, imdur, losartan and metoprolol for now and monitor bp readings.    Blanchie Serve, MD  [facility] [phone] (Monday-Friday 8 am - 5 pm) [phone] (afterhours)

## 2015-03-28 ENCOUNTER — Encounter (HOSPITAL_COMMUNITY): Payer: Self-pay | Admitting: Neurology

## 2015-03-28 ENCOUNTER — Emergency Department (HOSPITAL_COMMUNITY): Payer: Medicare Other

## 2015-03-28 ENCOUNTER — Emergency Department (HOSPITAL_COMMUNITY)
Admission: EM | Admit: 2015-03-28 | Discharge: 2015-03-28 | Disposition: A | Discharge status: Home / Self Care | Payer: Medicare Other | Attending: Emergency Medicine | Admitting: Emergency Medicine

## 2015-03-28 DIAGNOSIS — Y9289 Other specified places as the place of occurrence of the external cause: Secondary | ICD-10-CM | POA: Diagnosis not present

## 2015-03-28 DIAGNOSIS — N189 Chronic kidney disease, unspecified: Secondary | ICD-10-CM | POA: Insufficient documentation

## 2015-03-28 DIAGNOSIS — Z8673 Personal history of transient ischemic attack (TIA), and cerebral infarction without residual deficits: Secondary | ICD-10-CM | POA: Diagnosis not present

## 2015-03-28 DIAGNOSIS — F329 Major depressive disorder, single episode, unspecified: Secondary | ICD-10-CM | POA: Insufficient documentation

## 2015-03-28 DIAGNOSIS — Y998 Other external cause status: Secondary | ICD-10-CM | POA: Insufficient documentation

## 2015-03-28 DIAGNOSIS — Z950 Presence of cardiac pacemaker: Secondary | ICD-10-CM | POA: Insufficient documentation

## 2015-03-28 DIAGNOSIS — Y9389 Activity, other specified: Secondary | ICD-10-CM | POA: Diagnosis not present

## 2015-03-28 DIAGNOSIS — E785 Hyperlipidemia, unspecified: Secondary | ICD-10-CM | POA: Insufficient documentation

## 2015-03-28 DIAGNOSIS — F419 Anxiety disorder, unspecified: Secondary | ICD-10-CM | POA: Diagnosis not present

## 2015-03-28 DIAGNOSIS — X58XXXA Exposure to other specified factors, initial encounter: Secondary | ICD-10-CM | POA: Diagnosis not present

## 2015-03-28 DIAGNOSIS — Z7982 Long term (current) use of aspirin: Secondary | ICD-10-CM | POA: Diagnosis not present

## 2015-03-28 DIAGNOSIS — Z880 Allergy status to penicillin: Secondary | ICD-10-CM | POA: Diagnosis not present

## 2015-03-28 DIAGNOSIS — E559 Vitamin D deficiency, unspecified: Secondary | ICD-10-CM | POA: Diagnosis not present

## 2015-03-28 DIAGNOSIS — J45909 Unspecified asthma, uncomplicated: Secondary | ICD-10-CM | POA: Insufficient documentation

## 2015-03-28 DIAGNOSIS — Z8739 Personal history of other diseases of the musculoskeletal system and connective tissue: Secondary | ICD-10-CM | POA: Insufficient documentation

## 2015-03-28 DIAGNOSIS — Z7951 Long term (current) use of inhaled steroids: Secondary | ICD-10-CM | POA: Diagnosis not present

## 2015-03-28 DIAGNOSIS — Z8781 Personal history of (healed) traumatic fracture: Secondary | ICD-10-CM | POA: Insufficient documentation

## 2015-03-28 DIAGNOSIS — I480 Paroxysmal atrial fibrillation: Secondary | ICD-10-CM | POA: Insufficient documentation

## 2015-03-28 DIAGNOSIS — S0300XA Dislocation of jaw, unspecified side, initial encounter: Secondary | ICD-10-CM | POA: Insufficient documentation

## 2015-03-28 DIAGNOSIS — Z79899 Other long term (current) drug therapy: Secondary | ICD-10-CM | POA: Diagnosis not present

## 2015-03-28 DIAGNOSIS — I129 Hypertensive chronic kidney disease with stage 1 through stage 4 chronic kidney disease, or unspecified chronic kidney disease: Secondary | ICD-10-CM | POA: Diagnosis not present

## 2015-03-28 DIAGNOSIS — R6884 Jaw pain: Secondary | ICD-10-CM | POA: Diagnosis present

## 2015-03-28 MED ORDER — SODIUM CHLORIDE 0.9 % IV BOLUS (SEPSIS)
500.0000 mL | Freq: Once | INTRAVENOUS | Status: AC
  Administered 2015-03-28: 500 mL via INTRAVENOUS

## 2015-03-28 MED ORDER — FENTANYL CITRATE (PF) 100 MCG/2ML IJ SOLN
100.0000 ug | Freq: Once | INTRAMUSCULAR | Status: AC
  Administered 2015-03-28: 100 ug via INTRAVENOUS
  Filled 2015-03-28: qty 2

## 2015-03-28 MED ORDER — PROPOFOL 10 MG/ML IV BOLUS
0.5000 mg/kg | Freq: Once | INTRAVENOUS | Status: AC
  Administered 2015-03-28: 25.4 mg via INTRAVENOUS
  Filled 2015-03-28: qty 20

## 2015-03-28 MED ORDER — SODIUM CHLORIDE 0.9 % IV BOLUS (SEPSIS): 500.0000 mL | Freq: Once | INTRAVENOUS | Status: DC

## 2015-03-28 NOTE — ED Notes (Signed)
[place] to inform pt will be returning. Pt is now a&o at baseline. Pt VSS. Pt daughter, Huston Foley, contacted to inform about procedure completion and pt discharge to [facility]. Requesting transport by [place] called and sending for transport. No questions/concerns at this time.

## 2015-03-28 NOTE — Sedation Documentation (Signed)
Dr. Winfred Leeds relocated TMJ. Procedure complete. Will continue to closely monitor pt.

## 2015-03-28 NOTE — ED Provider Notes (Signed)
CSN: [id number]     Arrival date & time 03/28/15  0721 History   First MD Initiated Contact with Patient 03/28/15 [phone]     Chief Complaint  Patient presents with  . Jaw Pain   Level5 caveat patient noncommunicative history is obtained from Riki Sheer, RN at skilled nursing facility  (Consider location/radiation/quality/duration/timing/severity/associated sxs/prior Treatment) HPI She has recurrent jaw dislocations and as of 6 AM today she was felt to have a dislocated jaw. She could not close her mouth. Patient is unable to voice complaints. No treatment prior to coming here. Seen here 03/15/2014 for similar complaint, mandible was reduced in the ED. Past Medical History  Diagnosis Date  . Tachycardia-bradycardia syndrome [facility])     s/p Chemical engineer PPM implant in [place]  . Paroxysmal atrial fibrillation (HCC)     chads2vasc score is at least 6.  She is not a candidate for anticoagulation due to falls.  Her AF burden is low.  . Hypertension   . Frequent falls   . Asthma   . Hyperlipidemia   . Depression   . Anxiety   . Pacemaker   . Stroke ([place])   . Osteoporosis 06/23/2013  . Vitamin D deficiency   . Sick sinus syndrome ([place])   . Hypothyroidism   . Chronic kidney disease   . Fracture of greater trochanter of left femur ([place]) 04/02/2013  . Hip fracture, left ([place]) 04/02/2013  . Pelvic fracture ([place]) 06/23/2013  . Senile osteoporosis 02/26/2014  . Rhinitis, allergic 02/26/2014  . TMJ (dislocation of temporomandibular joint) 06/16/2014   Past Surgical History  Procedure Laterality Date  . Pacemaker insertion  01/04/2005    [place] PPM [id number] [phone]), GDT [id number] atrial lead and 4457 V lead all implanted in [place]  . Cholecystectomy    . Gallbladder surgery    . Tonsillectomy and adenoidectomy     Family History  Problem Relation Age of Onset  . Diabetes Mother   . High blood pressure Mother   . Heart Problems Father    Social History  Substance Use Topics  .  Smoking status: Never Smoker   . Smokeless tobacco: Never Used  . Alcohol Use: No   lives in skilled nursing facility, DO NOT RESUSCITATE CODE STATUS OB History    No data available     Review of Systems  Unable to perform ROS: Patient nonverbal      Allergies  Penicillins  Home Medications   Prior to Admission medications   Medication Sig Start Date End Date Taking? Authorizing Provider  acetaminophen (TYLENOL) 325 MG tablet Take 650 mg by mouth every 4 (four) hours as needed for moderate pain.    Historical Provider, MD  amLODipine (NORVASC) 5 MG tablet Take one tablet by mouth once daily for hypertension 08/21/14   Historical Provider, MD  aspirin EC 81 MG tablet Take one tablet by mouth once daily for AFIB    Historical Provider, MD  cholecalciferol (VITAMIN D) 1000 UNITS tablet Take one tablet by mouth once daily for vitamin D deficiency    Historical Provider, MD  Fluticasone-Salmeterol (ADVAIR) 250-50 MCG/DOSE AEPB Inhale one puff into the lungs twice daily for SOB/Wheeze. Rinse mouth after use.    Historical Provider, MD  ipratropium-albuterol (DUONEB) 0.5-2.5 (3) MG/3ML SOLN Inhale 3 mLs into the lungs every 8 (eight) hours as needed. Shortness of breath and wheezing 06/27/14   Historical Provider, MD  isosorbide mononitrate (IMDUR) 30 MG 24 hr tablet Take one tablet  by mouth every morning for hypertension    Historical Provider, MD  losartan (COZAAR) 100 MG tablet Take one tablet by mouth once daily for hypertension    Historical Provider, MD  memantine (NAMENDA XR) 28 MG CP24 24 hr capsule Take 28 mg by mouth daily.    Historical Provider, MD  metoprolol (LOPRESSOR) 50 MG tablet Take one tablet by mouth twice daily for hypertension    Historical Provider, MD  mirtazapine (REMERON) 7.5 MG tablet Take 7.5 mg by mouth at bedtime.    Historical Provider, MD  montelukast (SINGULAIR) 10 MG tablet Take one tablet by mouth every night at bedtime for allergies    Historical Provider,  MD  OXYGEN Inhale 2 mLs into the lungs.    Historical Provider, MD  potassium chloride SA (K-DUR,KLOR-CON) 20 MEQ tablet Take one tablet by mouth once daily with food for hypokalemia    Historical Provider, MD  simvastatin (ZOCOR) 20 MG tablet Take 30 mg by mouth daily.     Historical Provider, MD   BP 197/85 mmHg  Pulse 77  Temp(Src) 98.3 F (36.8 C) (Oral)  Resp 24  SpO2 100% Physical Exam  Constitutional:  Chronically ill-appearing frail  HENT:  Head: Normocephalic and atraumatic.  Handling secretions well. Mild held in open position.  Eyes: Conjunctivae are normal. Pupils are equal, round, and reactive to light.  Neck: Neck supple. No tracheal deviation present. No thyromegaly present.  Cardiovascular: Normal rate and regular rhythm.   No murmur heard. Pulmonary/Chest: Effort normal and breath sounds normal.  Abdominal: Soft. Bowel sounds are normal. She exhibits no distension. There is no tenderness.  Musculoskeletal: Normal range of motion. She exhibits no edema or tenderness.  Neurological: She is alert. Coordination normal.  Skin: Skin is warm and dry. No rash noted.  Nursing note and vitals reviewed.   ED Course  .Sedation Date/Time: 03/28/2015 10:26 AM Performed by: Orlie Dakin Authorized by: Orlie Dakin  Consent:    Consent obtained:  Verbal   Consent given by:  Guardian   Risks discussed:  Allergic reaction and prolonged sedation necessitating reversal   Alternatives discussed:  Analgesia without sedation Indications:    Sedation purpose:  Dislocation reduction   Procedure necessitating sedation performed by:  Physician performing sedation   Intended level of sedation:  Moderate (conscious sedation) Pre-sedation assessment:    NPO status caution: unable to specify NPO status     ASA classification: class 3 - patient with severe systemic disease     Neck mobility: normal     Mouth opening:  2 finger widths   Thyromental distance:  3 finger widths    Mallampati score:  I - soft palate, uvula, fauces, pillars visible   Pre-sedation assessments completed and reviewed: airway patency, cardiovascular function and hydration status     Pre-sedation assessment completed:  03/28/2015 9:50 AM Immediate pre-procedure details:    Reassessment: Patient reassessed immediately prior to procedure     Reviewed: vital signs and relevant labs/tests     Verified: bag valve mask available, emergency equipment available, IV patency confirmed and oxygen available   Procedure details (see MAR for exact dosages):    Sedation start time:  03/28/2015 10:11 AM   Preoxygenation:  Room air   Sedation:  Propofol   Analgesia:  Fentanyl   Intra-procedure monitoring:  Blood pressure monitoring, continuous capnometry, cardiac monitor, frequent LOC assessments and frequent vital sign checks   Intra-procedure events: none     Intra-procedure management:  Supplemental oxygen  Sedation end time:  03/28/2015 10:15 AM Post-procedure details:    Post-sedation assessment completed:  03/28/2015 10:31 AM   Attendance: Constant attendance by certified staff until patient recovered     Recovery: Patient returned to pre-procedure baseline     Post-sedation assessments completed and reviewed: airway patency, cardiovascular function, hydration status and mental status     Post-sedation assessments completed and reviewed: nausea/vomiting not reviewed     Patient is stable for discharge or admission: Yes     Patient tolerance:  Tolerated well, no immediate complications Comments:     10:31 AM patient is alert said thank you and handling secretions well. Clinically no longer with mandibular dislocation  (including critical care time) Labs Review Labs Reviewed - No data to display  Imaging Review No results found. I have personally reviewed and evaluated these images and lab results as part of my medical decision-making.   EKG Interpretation None      I attempted reduction of  patient's mandible without sedation however she could not tolerate the procedure due to pain. Procedural sedation was used using intravenous propofol. For consent was obtained over the telephone from patient's daughter Michel Bickers with nurse as witness. Risks and benefits of sedation were discussed . Results for orders placed or performed in visit on 03/23/15  Implantable device check  Result Value Ref Range   Date Time Interrogation Session [id number]    Pulse Generator Manufacturer [employer]    Pulse Gen Model 1290 Insignia Ultra DR    Pulse Gen Serial Number [id number]    Implantable Pulse Generator Type Implantable Pulse Generator    Implantable Pulse Generator Implant Date [id number]    Implantable Lead Manufacturer [facility]    Implantable Lead Model 4457    Implantable Lead Serial Number [id number]    Implantable Lead Implant Date [id number]    Implantable Lead Location [id number]    Implantable Lead Manufacturer [facility]    Implantable Lead Model 4480    Implantable Lead Serial Number [id number]    Implantable Lead Implant Date [id number]    Implantable Lead Location [id number]    Lead Channel Setting Sensing Sensitivity 1.0 mV   Lead Channel Setting Sensing Adaptation Mode Fixed Pacing    Lead Channel Setting Pacing Amplitude 2.0 V   Lead Channel Setting Pacing Pulse Width 0.50 ms   Lead Channel Setting Pacing Amplitude 2.4 V   Lead Channel Impedance Value 520 ohm   Lead Channel Sensing Intrinsic Amplitude 3.0 (>) mV   Lead Channel Pacing Threshold Amplitude 0.4 V   Lead Channel Pacing Threshold Pulse Width 0.50 ms   Lead Channel Impedance Value 410 ohm   Lead Channel Sensing Intrinsic Amplitude 6.0 (>) mV   Lead Channel Pacing Threshold Amplitude 0.6 V   Lead Channel Pacing Threshold Pulse Width 0.50 ms   Battery Status BOS    Brady Statistic RA Percent Paced 36 %   Brady Statistic RV Percent Paced 2 %   Eval Rhythm SR 60s    Dg Mandible 1-3 Views  03/12/2015  CLINICAL DATA:   82 year old female status post reduction of mandible dislocation. EXAM: MANDIBLE - 1-3 VIEW FINDINGS: No lateral views are submitted for evaluation. On the 2 frontal projections the mandible appears grossly intact. IMPRESSION: 1. Limited examination demonstrating no acute abnormality of the mandible. Electronically Signed   By: Vinnie Langton M.D.   On: 03/12/2015 16:12   Dg Mandible 4 Views  03/16/2015  CLINICAL DATA:  Jaw dislocation.  Status post reduction.  EXAM: MANDIBLE - 4+ VIEW COMPARISON:  None. FINDINGS: Possible nondisplaced fracture of the right mandibular condyle. Evaluation for dislocation is limited. No aggressive osseous lesion. IMPRESSION: 1. Overall limited evaluation. Possible nondisplaced fracture of the right mandibular condyle. Electronically Signed   By: Kathreen Devoid   On: 03/16/2015 13:28   Ct Maxillofacial Wo Cm  03/28/2015  CLINICAL DATA:  dislocated jaw . Pt has dementia and wears ace wrap around her face and chin to keep her jaw in place but she frequently takes it off EXAM: CT MAXILLOFACIAL WITHOUT CONTRAST TECHNIQUE: Multidetector CT imaging of the maxillofacial structures was performed. Multiplanar CT image reconstructions were also generated. A small metallic BB was placed on the right temple in order to reliably differentiate right from left. COMPARISON:  03/16/2015 FINDINGS: Orbits and globes intact. Paranasal sinuses and mastoid air cells are normally developed and well aerated. Anterior dislocation of bilateral temporomandibular joints. Negative for fracture. Multiple dental restorations. Mild spondylitic changes in the visualized cervical spine. Bilateral calcified carotid bifurcation plaque. No mass or adenopathy. IMPRESSION: 1. Bilateral anterior dislocation of the temporomandibular joints. 2. Negative for fracture. 3. Bilateral carotid plaque. Electronically Signed   By: Lucrezia Europe M.D.   On: 03/28/2015 09:12   Ct Maxillofacial Wo Cm  03/16/2015  CLINICAL DATA:  Jaw  pain, possible dislocation EXAM: CT MAXILLOFACIAL WITHOUT CONTRAST TECHNIQUE: Multidetector CT imaging of the maxillofacial structures was performed. Multiplanar CT image reconstructions were also generated. A small metallic BB was placed on the right temple in order to reliably differentiate right from left. COMPARISON:  None. FINDINGS: The examination is severely limited secondary to patient motion which especially degrades the evaluation of the mandible. The globes are intact. The orbital walls are intact. The orbital floors are intact. The maxilla is intact. The zygomatic arches are intact. The nasal septum is midline. There is no nasal bone fracture. The temporomandibular joints are normal. There is mild mucosal thickening of the right maxillary sinus. The visualized portions of the mastoid sinuses are well aerated. IMPRESSION: Examination is severely limited secondary to patient motion which especially degrades the evaluation of the mandible. If there is further clinical concern regarding mandibular injury, recommend repeat of the CT of the maxillofacial bones with sedation. No TMJ dislocation. Electronically Signed   By: Kathreen Devoid   On: 03/16/2015 15:08    MDM  Plan D/c to SNF Dx Bilateral mandibular dislocation Final diagnoses:  None        Orlie Dakin, MD 03/28/15 [phone]

## 2015-03-28 NOTE — ED Notes (Signed)
Per [facility]- pt is coming from [facility] with reports of dislocated jaw with history of same. Pt has dementia and wears ace wrap around her face and chin to keep her jaw in place but she frequently takes it off, which she did last night. Pt is alert to baseline. Is able to close mouth but lower mouth is protruding outward. BP 200/98

## 2015-03-28 NOTE — ED Notes (Signed)
This RN witnessed verbal consent to Dr. Winfred Leeds to relocate jaw for pt, by Michel Bickers, daughter.

## 2015-03-28 NOTE — Discharge Instructions (Signed)
Jaw Dislocation Contact an oral surgeon if it is determined that Ms. Kassis or family would consider surgery to prevent recurrent jaw dislocations. A jaw dislocation happens when your lower jaw bone separates from your upper jaw bone. This injury is often caused by a very strong force to your jaw. Your doctor will put your lower jaw bone back in place. Any broken jaw bones will be held in place with plates and screws or wiring. Wires may be left in place for 6 to 8 weeks.  HOME CARE   Rest your injured joint. Do not move it.  Put ice on your injured joint for 1 to 2 days or as told by your doctor.  Put ice in a plastic bag.  Place a towel between your skin and the bag.  Leave the ice on for 15 to 20 minutes, every 2 hours while you are awake.  Only take medicines as told by your doctor.  Only eat foods or drink liquids that your doctor tells you to. GET HELP RIGHT AWAY IF:  Your plates, screws, or wires become loose or damaged.  You see fluid or pus around the cuts where your screws, wires, or plates were placed.  Your pain becomes worse, not better. MAKE SURE YOU:  Understand these instructions.  Will watch your condition.  Will get help right away if you are not doing well or get worse.   This information is not intended to replace advice given to you by your health care provider. Make sure you discuss any questions you have with your health care provider.   Document Released: 10/20/2010 Document Revised: 05/02/2011 Document Reviewed: 07/07/2014 Elsevier Interactive Patient Education [employer].

## 2015-03-31 LAB — BASIC METABOLIC PANEL
BUN: 19 mg/dL (ref 4–21)
CREATININE: 0.6 mg/dL (ref 0.5–1.1)
GLUCOSE: 93 mg/dL
Potassium: 3.8 mmol/L (ref 3.4–5.3)
SODIUM: 144 mmol/L (ref 137–147)

## 2015-03-31 LAB — HEMOGLOBIN A1C: HEMOGLOBIN A1C: 6.1

## 2015-03-31 LAB — MICROALBUMIN, URINE: Microalb, Ur: 25.7

## 2015-04-23 ENCOUNTER — Non-Acute Institutional Stay (SKILLED_NURSING_FACILITY): Payer: Medicare Other | Admitting: Internal Medicine

## 2015-04-23 ENCOUNTER — Encounter: Payer: Self-pay | Admitting: Internal Medicine

## 2015-04-23 DIAGNOSIS — E46 Unspecified protein-calorie malnutrition: Secondary | ICD-10-CM | POA: Diagnosis not present

## 2015-04-23 DIAGNOSIS — E785 Hyperlipidemia, unspecified: Secondary | ICD-10-CM | POA: Diagnosis not present

## 2015-04-23 DIAGNOSIS — Z0000 Encounter for general adult medical examination without abnormal findings: Secondary | ICD-10-CM | POA: Diagnosis not present

## 2015-04-23 DIAGNOSIS — J4520 Mild intermittent asthma, uncomplicated: Secondary | ICD-10-CM

## 2015-04-23 DIAGNOSIS — I10 Essential (primary) hypertension: Secondary | ICD-10-CM

## 2015-04-23 DIAGNOSIS — F0390 Unspecified dementia without behavioral disturbance: Secondary | ICD-10-CM

## 2015-04-23 DIAGNOSIS — I480 Paroxysmal atrial fibrillation: Secondary | ICD-10-CM

## 2015-04-23 DIAGNOSIS — E559 Vitamin D deficiency, unspecified: Secondary | ICD-10-CM

## 2015-04-23 DIAGNOSIS — I5032 Chronic diastolic (congestive) heart failure: Secondary | ICD-10-CM

## 2015-04-23 DIAGNOSIS — F0391 Unspecified dementia with behavioral disturbance: Secondary | ICD-10-CM | POA: Insufficient documentation

## 2015-04-23 DIAGNOSIS — F03918 Unspecified dementia, unspecified severity, with other behavioral disturbance: Secondary | ICD-10-CM | POA: Insufficient documentation

## 2015-04-23 NOTE — Progress Notes (Signed)
Patient ID: Yvonne Buck, female   DOB: 02/03/1930, 82 y.o.   MRN: [id number]      LOCATION: Facility: [place], [place]  Allergies  Allergen Reactions  . Penicillins Other (See Comments)    Red spots ### Tolerated Rocephin 12/2012 ###     CODE STATUS: DNR  GOALS OF CARE: Advanced Directives 03/16/2015  Does patient have an advance directive? Yes  Type of Advance Directive Out of facility DNR (pink MOST or yellow form)  Does patient want to make changes to advanced directive? No - Patient declined  Copy of advanced directive(s) in chart? Yes  Pre-existing out of facility DNR order (yellow form or pink MOST form) Yellow form placed in chart (order not valid for inpatient use)     Chief Complaint  Patient presents with  . Annual Exam    annual visit.    HPI 82 y.o. female seen today for annual wellness exam. She had been at her baseline until this afternoon. She appears lethargic per staff. She was recently treated for UTI a week back. She is a high fall risk and is on a gerichair. No recent fall reported. No new skin concern.    Health Maintenance  Topic Date Due  . DEXA SCAN  02/22/2016 (Originally 10/04/1996)  . PNA vac Low Risk Adult (2 of 2 - PCV13) 02/22/2016 (Originally 02/01/2014)  . INFLUENZA VACCINE  09/22/2015  . TETANUS/TDAP  06/07/2023  . ZOSTAVAX  Addressed   Immunization History  Administered Date(s) Administered  . Influenza-Unspecified 11/28/2013, 12/03/2014  . PPD Test 01/03/2013, 01/18/2013, 04/19/2013, 06/25/2014, 10/29/2014  . Pneumococcal-Unspecified 02/01/2013     Past Medical History  Diagnosis Date  . Tachycardia-bradycardia syndrome [facility])     s/p Chemical engineer PPM implant in [place]  . Paroxysmal atrial fibrillation (HCC)     chads2vasc score is at least 6.  She is not a candidate for anticoagulation due to falls.  Her AF burden is low.  . Hypertension   . Frequent falls   . Asthma   .  Hyperlipidemia   . Depression   . Anxiety   . Pacemaker   . Stroke ([place])   . Osteoporosis 06/23/2013  . Vitamin D deficiency   . Sick sinus syndrome ([place])   . Hypothyroidism   . Chronic kidney disease   . Fracture of greater trochanter of left femur ([place]) 04/02/2013  . Hip fracture, left ([place]) 04/02/2013  . Pelvic fracture ([place]) 06/23/2013  . Senile osteoporosis 02/26/2014  . Rhinitis, allergic 02/26/2014  . TMJ (dislocation of temporomandibular joint) 06/16/2014    Past Surgical History  Procedure Laterality Date  . Pacemaker insertion  01/04/2005    [place] PPM 1290 [phone]), GDT [phone] atrial lead and 4457 V lead all implanted in [place]  . Cholecystectomy    . Gallbladder surgery    . Tonsillectomy and adenoidectomy        Medication List       This list is accurate as of: 04/23/15  7:31 PM.  Always use your most recent med list.               acetaminophen 325 MG tablet  Commonly known as:  TYLENOL  Take 650 mg by mouth every 4 (four) hours as needed for moderate pain.     amLODipine 5 MG tablet  Commonly known as:  NORVASC  Take one tablet by mouth once daily for hypertension  aspirin EC 81 MG tablet  Take one tablet by mouth once daily for AFIB     cholecalciferol 1000 units tablet  Commonly known as:  VITAMIN D  Take one tablet by mouth once daily for vitamin D deficiency     Fluticasone-Salmeterol 250-50 MCG/DOSE Aepb  Commonly known as:  ADVAIR  Inhale one puff into the lungs twice daily for SOB/Wheeze. Rinse mouth after use.     ipratropium-albuterol 0.5-2.5 (3) MG/3ML Soln  Commonly known as:  DUONEB  Inhale 3 mLs into the lungs every 8 (eight) hours as needed. Shortness of breath and wheezing     isosorbide mononitrate 30 MG 24 hr tablet  Commonly known as:  IMDUR  Take one tablet by mouth every morning for hypertension     losartan 100 MG tablet  Commonly known as:  COZAAR  Take one tablet by mouth once daily for hypertension      metoprolol 50 MG tablet  Commonly known as:  LOPRESSOR  Take one tablet by mouth twice daily for hypertension     mirtazapine 7.5 MG tablet  Commonly known as:  REMERON  Take 7.5 mg by mouth at bedtime.     montelukast 10 MG tablet  Commonly known as:  SINGULAIR  Take one tablet by mouth every night at bedtime for allergies     NAMENDA XR 28 MG Cp24 24 hr capsule  Generic drug:  memantine  Take 28 mg by mouth daily.     OXYGEN  Inhale 2 mLs into the lungs.     potassium chloride SA 20 MEQ tablet  Commonly known as:  K-DUR,KLOR-CON  Take one tablet by mouth once daily with food for hypokalemia     saccharomyces boulardii 250 MG capsule  Commonly known as:  FLORASTOR  Take 250 mg by mouth daily. Stop date 05/03/15     simvastatin 20 MG tablet  Commonly known as:  ZOCOR  Take 30 mg by mouth daily.         Review of Systems  Constitutional: Positive for malaise/fatigue. Negative for fever, chills and diaphoresis.  HENT: Negative.  Negative for congestion.   Eyes: Negative.  Negative for blurred vision.  Respiratory: Negative.  Negative for cough and shortness of breath.   Cardiovascular: Negative.  Negative for chest pain, palpitations and leg swelling.  Gastrointestinal: Negative.  Negative for heartburn, nausea, vomiting and abdominal pain.  Genitourinary: Negative.   Musculoskeletal: Negative.   Skin: Negative.  Negative for itching and rash.  Neurological: Negative.  Negative for dizziness and headaches.  Endo/Heme/Allergies: Negative.   Psychiatric/Behavioral: Negative.  Negative for depression.    Physical exam:  Filed Vitals:   04/23/15 1510  BP: 142/86  Pulse: 78  Temp: 97.9 F (36.6 C)  TempSrc: Oral  Resp: 20  Height: 5\' 6"  (1.676 m)  Weight: 115 lb 12.8 oz (52.527 kg)   Body mass index is 18.7 kg/(m^2).    Physical Exam  Constitutional:  Frail Elderly in no acute distress.  HENT:  Head: Normocephalic and atraumatic.  Nose: Nose normal.    Mouth/Throat: Oropharynx is clear and moist.  Eyes: Conjunctivae and EOM are normal. Pupils are equal, round, and reactive to light. Right eye exhibits no discharge. Left eye exhibits no discharge. No scleral icterus.  Neck: Normal range of motion. No JVD present. No thyromegaly present.  Cardiovascular: Normal rate.  Exam reveals no gallop and no friction rub.   No murmur heard. Pulmonary/Chest: Effort normal and breath sounds normal.  No stridor. No respiratory distress. She has no wheezes. She has no rales. She exhibits no tenderness.  Abdominal: Soft. Bowel sounds are normal. She exhibits no distension and no mass. There is no tenderness. There is no rebound and no guarding.  Musculoskeletal: Normal range of motion. She exhibits no edema or tenderness.  Left side weakness   Lymphadenopathy:    She has no cervical adenopathy.  Neurological:  Appears lethargic and is pleasantly confused  Skin: Skin is warm and dry.  Psychiatric:  Poor insight and judgement  Vitals reviewed.   Labs reviewed:   Recent Labs  06/20/14 1633 06/21/14 0507 03/12/15 1330 03/31/15  NA 139 137 143 144  K 3.3* 3.7 4.2 3.8  CL 101 98 104  --   CO2 31 29 33*  --   GLUCOSE 120* 219* 103*  --   BUN 33* 34* 14 19  CREATININE 0.60 0.76 0.60 0.6  CALCIUM 9.2 8.9 9.3  --     Recent Labs  04/30/14 1548 06/19/14 0418 06/21/14 0507  AST 15 19 20   ALT 14 21 30   ALKPHOS 61 68 62  BILITOT 0.7 0.5 0.4  PROT 5.9* 7.1 6.2  ALBUMIN 3.1* 3.6 2.9*    Recent Labs  06/19/14 0418 06/20/14 1633 06/21/14 0507 03/12/15 1330 03/18/15  WBC 12.7* 14.3* 9.2 9.3 9.1  NEUTROABS 9.7* 8.7* 8.1*  --   --   HGB 15.0 16.0* 14.4 14.5 14.1  HCT 45.5 50.3* 45.2 46.0 46  MCV 87.3 88.6 88.1 90.7  --   PLT 335 381 327 281 280    Lab Results  Component Value Date   TSH 0.57 03/18/2015   Lab Results  Component Value Date   HGBA1C 6.1 03/31/2015     Assessment/plan  Annual exam Get FOBT for colon cancer  screening. uptodate with immunization. Reviewed labs. Continue assistance with ADLs. Fall precautions  afib Rate controlled. Continue lopressor 50 mg bid. Continue baby aspirin  Hyperlipidemia Lipid Panel     Component Value Date/Time   LDLDIRECT 83 12/10/2010 1018  check lipid panel. Continue simvastatin 20 mg daily  Vitamin d deficiency Continue vitamin d supplement  Chronic asthma Stable, continue advair and singulair with duoneb prn  Protein calorie malnutrition Continue fortified food, followed by dietary team. Continue remeron for appetite for now. Monitor weight.  Essential HTN Continue norvasc, imdur, losartan and metoprolol for now and monitor bp readings.   Diastolic chf Monitor clinically and continue losartan, metoprolol and imdur. Reviewed BMP  Dementia No behavioral disturbance. continue namenda xr for now. Continue assistance with ADLs. Continue fall precautions and pressure ulcer prophylaxis.   Blanchie Serve, MD  [facility] [phone] (Monday-Friday 8 am - 5 pm) [phone] (afterhours)

## 2015-05-05 LAB — HEPATIC FUNCTION PANEL
ALK PHOS: 70 U/L (ref 25–125)
ALT: 14 U/L (ref 7–35)
AST: 12 U/L — AB (ref 13–35)
BILIRUBIN, TOTAL: 0.6 mg/dL

## 2015-05-05 LAB — LIPID PANEL
Cholesterol: 165 mg/dL (ref 0–200)
HDL: 49 mg/dL (ref 35–70)
LDL CALC: 102 mg/dL
Triglycerides: 71 mg/dL (ref 40–160)

## 2015-05-05 LAB — CBC AND DIFFERENTIAL
HCT: 43 % (ref 36–46)
Hemoglobin: 13.6 g/dL (ref 12.0–16.0)
Platelets: 255 10*3/uL (ref 150–399)
WBC: 7.7 10^3/mL

## 2015-05-05 LAB — BASIC METABOLIC PANEL
BUN: 21 mg/dL (ref 4–21)
Creatinine: 0.6 mg/dL (ref 0.5–1.1)
GLUCOSE: 99 mg/dL
Potassium: 4 mmol/L (ref 3.4–5.3)
SODIUM: 144 mmol/L (ref 137–147)

## 2015-05-05 LAB — TSH: TSH: 0.57 u[IU]/mL (ref 0.41–5.90)

## 2015-05-23 ENCOUNTER — Emergency Department (HOSPITAL_COMMUNITY): Payer: Medicare Other

## 2015-05-23 ENCOUNTER — Encounter (HOSPITAL_COMMUNITY): Payer: Self-pay | Admitting: Adult Health

## 2015-05-23 ENCOUNTER — Emergency Department (HOSPITAL_COMMUNITY)
Admission: EM | Admit: 2015-05-23 | Discharge: 2015-05-23 | Disposition: A | Discharge status: Home / Self Care | Payer: Medicare Other | Attending: Emergency Medicine | Admitting: Emergency Medicine

## 2015-05-23 DIAGNOSIS — Y998 Other external cause status: Secondary | ICD-10-CM | POA: Insufficient documentation

## 2015-05-23 DIAGNOSIS — Y9289 Other specified places as the place of occurrence of the external cause: Secondary | ICD-10-CM | POA: Insufficient documentation

## 2015-05-23 DIAGNOSIS — E559 Vitamin D deficiency, unspecified: Secondary | ICD-10-CM | POA: Diagnosis not present

## 2015-05-23 DIAGNOSIS — M810 Age-related osteoporosis without current pathological fracture: Secondary | ICD-10-CM | POA: Insufficient documentation

## 2015-05-23 DIAGNOSIS — Z79899 Other long term (current) drug therapy: Secondary | ICD-10-CM | POA: Diagnosis not present

## 2015-05-23 DIAGNOSIS — Z8673 Personal history of transient ischemic attack (TIA), and cerebral infarction without residual deficits: Secondary | ICD-10-CM | POA: Diagnosis not present

## 2015-05-23 DIAGNOSIS — Z8781 Personal history of (healed) traumatic fracture: Secondary | ICD-10-CM | POA: Diagnosis not present

## 2015-05-23 DIAGNOSIS — X58XXXA Exposure to other specified factors, initial encounter: Secondary | ICD-10-CM | POA: Diagnosis not present

## 2015-05-23 DIAGNOSIS — Z950 Presence of cardiac pacemaker: Secondary | ICD-10-CM | POA: Diagnosis not present

## 2015-05-23 DIAGNOSIS — Y9389 Activity, other specified: Secondary | ICD-10-CM | POA: Diagnosis not present

## 2015-05-23 DIAGNOSIS — N189 Chronic kidney disease, unspecified: Secondary | ICD-10-CM | POA: Insufficient documentation

## 2015-05-23 DIAGNOSIS — Z7982 Long term (current) use of aspirin: Secondary | ICD-10-CM | POA: Insufficient documentation

## 2015-05-23 DIAGNOSIS — I480 Paroxysmal atrial fibrillation: Secondary | ICD-10-CM | POA: Insufficient documentation

## 2015-05-23 DIAGNOSIS — E785 Hyperlipidemia, unspecified: Secondary | ICD-10-CM | POA: Insufficient documentation

## 2015-05-23 DIAGNOSIS — F419 Anxiety disorder, unspecified: Secondary | ICD-10-CM | POA: Diagnosis not present

## 2015-05-23 DIAGNOSIS — F329 Major depressive disorder, single episode, unspecified: Secondary | ICD-10-CM | POA: Insufficient documentation

## 2015-05-23 DIAGNOSIS — Z880 Allergy status to penicillin: Secondary | ICD-10-CM | POA: Insufficient documentation

## 2015-05-23 DIAGNOSIS — S0300XA Dislocation of jaw, unspecified side, initial encounter: Secondary | ICD-10-CM

## 2015-05-23 DIAGNOSIS — J45909 Unspecified asthma, uncomplicated: Secondary | ICD-10-CM | POA: Diagnosis not present

## 2015-05-23 DIAGNOSIS — Z7951 Long term (current) use of inhaled steroids: Secondary | ICD-10-CM | POA: Diagnosis not present

## 2015-05-23 DIAGNOSIS — I129 Hypertensive chronic kidney disease with stage 1 through stage 4 chronic kidney disease, or unspecified chronic kidney disease: Secondary | ICD-10-CM | POA: Insufficient documentation

## 2015-05-23 LAB — CBC WITH DIFFERENTIAL/PLATELET
BASOS ABS: 0 10*3/uL (ref 0.0–0.1)
BASOS PCT: 0 %
EOS ABS: 1.8 10*3/uL — AB (ref 0.0–0.7)
Eosinophils Relative: 21 %
HEMATOCRIT: 46.7 % — AB (ref 36.0–46.0)
HEMOGLOBIN: 15.1 g/dL — AB (ref 12.0–15.0)
Lymphocytes Relative: 18 %
Lymphs Abs: 1.6 10*3/uL (ref 0.7–4.0)
MCH: 29 pg (ref 26.0–34.0)
MCHC: 32.3 g/dL (ref 30.0–36.0)
MCV: 89.6 fL (ref 78.0–100.0)
Monocytes Absolute: 0.7 10*3/uL (ref 0.1–1.0)
Monocytes Relative: 8 %
NEUTROS ABS: 4.5 10*3/uL (ref 1.7–7.7)
NEUTROS PCT: 53 %
Platelets: 322 10*3/uL (ref 150–400)
RBC: 5.21 MIL/uL — AB (ref 3.87–5.11)
RDW: 13.5 % (ref 11.5–15.5)
WBC: 8.7 10*3/uL (ref 4.0–10.5)

## 2015-05-23 LAB — URINALYSIS, ROUTINE W REFLEX MICROSCOPIC
Glucose, UA: 100 mg/dL — AB
Hgb urine dipstick: NEGATIVE
Ketones, ur: NEGATIVE mg/dL
LEUKOCYTES UA: NEGATIVE
NITRITE: NEGATIVE
PH: 6 (ref 5.0–8.0)
Protein, ur: NEGATIVE mg/dL
SPECIFIC GRAVITY, URINE: 1.026 (ref 1.005–1.030)

## 2015-05-23 LAB — COMPREHENSIVE METABOLIC PANEL
ALK PHOS: 77 U/L (ref 38–126)
ALT: 29 U/L (ref 14–54)
ANION GAP: 7 (ref 5–15)
AST: 21 U/L (ref 15–41)
Albumin: 3.1 g/dL — ABNORMAL LOW (ref 3.5–5.0)
BUN: 20 mg/dL (ref 6–20)
CALCIUM: 9.4 mg/dL (ref 8.9–10.3)
CO2: 30 mmol/L (ref 22–32)
CREATININE: 0.75 mg/dL (ref 0.44–1.00)
Chloride: 103 mmol/L (ref 101–111)
Glucose, Bld: 116 mg/dL — ABNORMAL HIGH (ref 65–99)
Potassium: 4.8 mmol/L (ref 3.5–5.1)
SODIUM: 140 mmol/L (ref 135–145)
TOTAL PROTEIN: 6.5 g/dL (ref 6.5–8.1)
Total Bilirubin: 1 mg/dL (ref 0.3–1.2)

## 2015-05-23 MED ORDER — PROPOFOL 10 MG/ML IV BOLUS
INTRAVENOUS | Status: AC | PRN
  Administered 2015-05-23: 20 mg via INTRAVENOUS

## 2015-05-23 MED ORDER — PROPOFOL 10 MG/ML IV BOLUS
0.5000 mg/kg | Freq: Once | INTRAVENOUS | Status: AC
  Administered 2015-05-23: 20 mg via INTRAVENOUS
  Filled 2015-05-23: qty 20

## 2015-05-23 MED ORDER — SODIUM CHLORIDE 0.9 % IV BOLUS (SEPSIS)
500.0000 mL | Freq: Once | INTRAVENOUS | Status: AC
  Administered 2015-05-23: 500 mL via INTRAVENOUS

## 2015-05-23 NOTE — ED Provider Notes (Addendum)
CSN: [id number]     Arrival date & time 05/23/15  1421 History   First MD Initiated Contact with Patient 05/23/15 1423     Chief Complaint  Patient presents with  . Dislocation    Level V caveat due to altered mental status.  The history is provided by the patient.  Patient presents with a likely recurrent jaw dislocation. She's had the same in the past. Patient is not able to be verbal and tell me anything. Nurse has seen her before and states she is normally much more responsive than this. She will look at me. She will follow commands and will squeeze hands.  Past Medical History  Diagnosis Date  . Tachycardia-bradycardia syndrome [facility])     s/p Chemical engineer PPM implant in [place]  . Paroxysmal atrial fibrillation (HCC)     chads2vasc score is at least 6.  She is not a candidate for anticoagulation due to falls.  Her AF burden is low.  . Hypertension   . Frequent falls   . Asthma   . Hyperlipidemia   . Depression   . Anxiety   . Pacemaker   . Stroke ([place])   . Osteoporosis 06/23/2013  . Vitamin D deficiency   . Sick sinus syndrome ([place])   . Hypothyroidism   . Chronic kidney disease   . Fracture of greater trochanter of left femur ([place]) 04/02/2013  . Hip fracture, left ([place]) 04/02/2013  . Pelvic fracture ([place]) 06/23/2013  . Senile osteoporosis 02/26/2014  . Rhinitis, allergic 02/26/2014  . TMJ (dislocation of temporomandibular joint) 06/16/2014   Past Surgical History  Procedure Laterality Date  . Pacemaker insertion  01/04/2005    [place] PPM 1290 [phone]), GDT [phone] atrial lead and 4457 V lead all implanted in [place]  . Cholecystectomy    . Gallbladder surgery    . Tonsillectomy and adenoidectomy     Family History  Problem Relation Age of Onset  . Diabetes Mother   . High blood pressure Mother   . Heart Problems Father    Social History  Substance Use Topics  . Smoking status: Never Smoker   . Smokeless tobacco: Never Used  . Alcohol Use: No   OB  History    No data available     Review of Systems  Unable to perform ROS: Mental status change      Allergies  Penicillins  Home Medications   Prior to Admission medications   Medication Sig Start Date End Date Taking? Authorizing Provider  acetaminophen (TYLENOL) 325 MG tablet Take 650 mg by mouth every 4 (four) hours as needed for moderate pain.   Yes Historical Provider, MD  amLODipine (NORVASC) 5 MG tablet Take 5 mg by mouth daily. Take one tablet by mouth once daily for hypertension 08/21/14  Yes Historical Provider, MD  aspirin EC 81 MG tablet Take 81 mg by mouth at bedtime. Take one tablet by mouth once daily for AFIB   Yes Historical Provider, MD  azithromycin (ZITHROMAX) 250 MG tablet Take 250 mg by mouth once. 05/22/15  Yes Historical Provider, MD  cholecalciferol (VITAMIN D) 1000 UNITS tablet Take 1,000 Units by mouth at bedtime. Take one tablet by mouth once daily for vitamin D deficiency   Yes Historical Provider, MD  Fluticasone-Salmeterol (ADVAIR) 250-50 MCG/DOSE AEPB Inhale 1 puff into the lungs 2 (two) times daily. Inhale one puff into the lungs twice daily for SOB/Wheeze. Rinse mouth after use.   Yes Historical Provider, MD  ipratropium-albuterol (DUONEB) 0.5-2.5 (3) MG/3ML SOLN Inhale 3 mLs into the lungs every 8 (eight) hours as needed. Shortness of breath and wheezing 06/27/14  Yes Historical Provider, MD  isosorbide mononitrate (IMDUR) 30 MG 24 hr tablet Take one tablet by mouth every morning for hypertension   Yes Historical Provider, MD  losartan (COZAAR) 100 MG tablet Take 100 mg by mouth daily. Take one tablet by mouth once daily for hypertension   Yes Historical Provider, MD  memantine (NAMENDA XR) 28 MG CP24 24 hr capsule Take 28 mg by mouth daily.   Yes Historical Provider, MD  metoprolol (LOPRESSOR) 50 MG tablet Take 50 mg by mouth 2 (two) times daily. Take one tablet by mouth twice daily for hypertension   Yes Historical Provider, MD  mirtazapine (REMERON) 7.5  MG tablet Take 7.5 mg by mouth at bedtime.   Yes Historical Provider, MD  montelukast (SINGULAIR) 10 MG tablet Take 10 mg by mouth at bedtime. Take one tablet by mouth every night at bedtime for allergies   Yes Historical Provider, MD  OXYGEN Inhale 2 mLs into the lungs as needed (to keep sats above 90%).    Yes Historical Provider, MD  potassium chloride SA (K-DUR,KLOR-CON) 20 MEQ tablet Take 20 mEq by mouth at bedtime. Take one tablet by mouth once daily with food for hypokalemia   Yes Historical Provider, MD  simvastatin (ZOCOR) 10 MG tablet Take 10 mg by mouth at bedtime. 04/25/15  Yes Historical Provider, MD  simvastatin (ZOCOR) 20 MG tablet Take 30 mg by mouth daily at 6 PM. Reported on 05/23/2015   Yes Historical Provider, MD   BP 111/58 mmHg  Pulse 69  Temp(Src) 97.8 F (36.6 C) (Axillary)  Resp 27  Wt 115 lb 11.9 oz (52.5 kg)  SpO2 98% Physical Exam  Constitutional: She appears well-developed.  HENT:  Mucous membranes somewhat dry. Mouth is held open.   Eyes: Pupils are equal, round, and reactive to light.  Neck: Neck supple.  Cardiovascular: Regular rhythm.   Pulmonary/Chest: Effort normal.  Abdominal: Soft. There is no tenderness.  Musculoskeletal: Normal range of motion.  Neurological: She is alert.  Patient is alert. Will follow commands. Nonverbal at this time. Chronic left-sided weakness.  Skin: Skin is warm.    ED Course  .Sedation Date/Time: 05/23/2015 3:50 PM Performed by: Davonna Belling Authorized by: Davonna Belling  Consent:    Consent obtained:  Written   Consent given by:  Patient   Risks discussed:  Allergic reaction, prolonged hypoxia resulting in organ damage, respiratory compromise necessitating ventilatory assistance and intubation, nausea and vomiting   Alternatives discussed:  Analgesia without sedation Indications:    Sedation purpose:  Dislocation reduction   Procedure necessitating sedation performed by:  Different physician   Intended level  of sedation:  Deep Pre-sedation assessment:    Time since last food or drink:  5hrs   ASA classification: class 3 - patient with severe systemic disease     Neck mobility: normal     Mouth opening:  3 or more finger widths   Thyromental distance:  2 finger widths   Mallampati score:  I - soft palate, uvula, fauces, pillars visible   Pre-sedation assessments completed and reviewed: airway patency, cardiovascular function, hydration status, mental status, nausea/vomiting, pain level, respiratory function and temperature   Immediate pre-procedure details:    Reassessment: Patient reassessed immediately prior to procedure     Reviewed: NPO status     Verified: bag valve mask available, emergency  equipment available, intubation equipment available, IV patency confirmed, oxygen available and suction available   Procedure details (see MAR for exact dosages):    Sedation start time:  05/23/2015 3:15 PM   Preoxygenation:  Nasal cannula   Sedation:  Propofol   Analgesia:  None   Intra-procedure monitoring:  Blood pressure monitoring, continuous capnometry, continuous pulse oximetry, frequent LOC assessments, frequent vital sign checks and cardiac monitor   Intra-procedure events: none     Total sedation time (minutes):  10 Post-procedure details:    Post-sedation assessments completed and reviewed: airway patency, hydration status, mental status and respiratory function     Post-sedation assessments completed and reviewed: nausea/vomiting not reviewed     Patient is stable for discharge or admission: Yes     Patient tolerance:  Tolerated well, no immediate complications  (including critical care time) Labs Review Labs Reviewed  COMPREHENSIVE METABOLIC PANEL - Abnormal; Notable for the following:    Glucose, Bld 116 (*)    Albumin 3.1 (*)    All other components within normal limits  CBC WITH DIFFERENTIAL/PLATELET - Abnormal; Notable for the following:    RBC 5.21 (*)    Hemoglobin 15.1 (*)     HCT 46.7 (*)    Eosinophils Absolute 1.8 (*)    All other components within normal limits  URINALYSIS, ROUTINE W REFLEX MICROSCOPIC (NOT AT [facility])    Imaging Review Dg Chest Portable 1 View  05/23/2015  CLINICAL DATA:  Cough. EXAM: PORTABLE CHEST 1 VIEW COMPARISON:  June 20, 2014. FINDINGS: The heart size and mediastinal contours are within normal limits. Both lungs are clear. Left-sided pacemaker is unchanged. The visualized skeletal structures are unremarkable. IMPRESSION: No acute cardiopulmonary abnormality seen. Electronically Signed   By: Marijo Conception, M.D.   On: 05/23/2015 14:52   I have personally reviewed and evaluated these images and lab results as part of my medical decision-making.   EKG Interpretation None      MDM   Final diagnoses:  Dislocation of mandible, initial encounter    Patient with jaw dislocation. Has had numerous episodes of this. Reduced under sedation in the ER by Dr. Lavinia Sharps under my direct supervision. Also is been somewhat weak. Lab work overall reassuring. Urinalysis pending.    Davonna Belling, MD 05/23/15 [place], MD 05/24/15 [phone]

## 2015-05-23 NOTE — ED Notes (Signed)
Patient unable to sign for discharge.  Report attempted to facility multiple times without answer.  Daughter aware of treatment plan and recommendations.

## 2015-05-23 NOTE — ED Notes (Signed)
PTAR here to pick up patient 

## 2015-05-23 NOTE — ED Notes (Signed)
Attempted to call report to nurse at facility.  No answer. Will reach out again.

## 2015-05-23 NOTE — Sedation Documentation (Signed)
Daughter at bedside. ([place])

## 2015-05-23 NOTE — ED Notes (Signed)
Pt placed in clothes from home.  Pulse ox still in place for monitoring until transfer.

## 2015-05-23 NOTE — ED Provider Notes (Signed)
CSN: [id number]     Arrival date & time 05/23/15  1421 History   First MD Initiated Contact with Patient 05/23/15 1423     Chief Complaint  Patient presents with  . Dislocation     (Consider location/radiation/quality/duration/timing/severity/associated sxs/prior Treatment) HPI  Past Medical History  Diagnosis Date  . Tachycardia-bradycardia syndrome [facility])     s/p Chemical engineer PPM implant in [place]  . Paroxysmal atrial fibrillation (HCC)     chads2vasc score is at least 6.  She is not a candidate for anticoagulation due to falls.  Her AF burden is low.  . Hypertension   . Frequent falls   . Asthma   . Hyperlipidemia   . Depression   . Anxiety   . Pacemaker   . Stroke ([place])   . Osteoporosis 06/23/2013  . Vitamin D deficiency   . Sick sinus syndrome ([place])   . Hypothyroidism   . Chronic kidney disease   . Fracture of greater trochanter of left femur ([place]) 04/02/2013  . Hip fracture, left ([place]) 04/02/2013  . Pelvic fracture ([place]) 06/23/2013  . Senile osteoporosis 02/26/2014  . Rhinitis, allergic 02/26/2014  . TMJ (dislocation of temporomandibular joint) 06/16/2014   Past Surgical History  Procedure Laterality Date  . Pacemaker insertion  01/04/2005    [place] PPM 1290 [phone]), GDT [phone] atrial lead and 4457 V lead all implanted in [place]  . Cholecystectomy    . Gallbladder surgery    . Tonsillectomy and adenoidectomy     Family History  Problem Relation Age of Onset  . Diabetes Mother   . High blood pressure Mother   . Heart Problems Father    Social History  Substance Use Topics  . Smoking status: Never Smoker   . Smokeless tobacco: Never Used  . Alcohol Use: No   OB History    No data available     Review of Systems    Allergies  Penicillins  Home Medications   Prior to Admission medications   Medication Sig Start Date End Date Taking? Authorizing Provider  acetaminophen (TYLENOL) 325 MG tablet Take 650 mg by mouth every 4 (four) hours as  needed for moderate pain.   Yes Historical Provider, MD  amLODipine (NORVASC) 5 MG tablet Take 5 mg by mouth daily. Take one tablet by mouth once daily for hypertension 08/21/14  Yes Historical Provider, MD  aspirin EC 81 MG tablet Take 81 mg by mouth at bedtime. Take one tablet by mouth once daily for AFIB   Yes Historical Provider, MD  azithromycin (ZITHROMAX) 250 MG tablet Take 250 mg by mouth once. 05/22/15  Yes Historical Provider, MD  cholecalciferol (VITAMIN D) 1000 UNITS tablet Take 1,000 Units by mouth at bedtime. Take one tablet by mouth once daily for vitamin D deficiency   Yes Historical Provider, MD  Fluticasone-Salmeterol (ADVAIR) 250-50 MCG/DOSE AEPB Inhale 1 puff into the lungs 2 (two) times daily. Inhale one puff into the lungs twice daily for SOB/Wheeze. Rinse mouth after use.   Yes Historical Provider, MD  ipratropium-albuterol (DUONEB) 0.5-2.5 (3) MG/3ML SOLN Inhale 3 mLs into the lungs every 8 (eight) hours as needed. Shortness of breath and wheezing 06/27/14  Yes Historical Provider, MD  isosorbide mononitrate (IMDUR) 30 MG 24 hr tablet Take one tablet by mouth every morning for hypertension   Yes Historical Provider, MD  losartan (COZAAR) 100 MG tablet Take 100 mg by mouth daily. Take one tablet by mouth once daily for hypertension  Yes Historical Provider, MD  memantine (NAMENDA XR) 28 MG CP24 24 hr capsule Take 28 mg by mouth daily.   Yes Historical Provider, MD  metoprolol (LOPRESSOR) 50 MG tablet Take 50 mg by mouth 2 (two) times daily. Take one tablet by mouth twice daily for hypertension   Yes Historical Provider, MD  mirtazapine (REMERON) 7.5 MG tablet Take 7.5 mg by mouth at bedtime.   Yes Historical Provider, MD  montelukast (SINGULAIR) 10 MG tablet Take 10 mg by mouth at bedtime. Take one tablet by mouth every night at bedtime for allergies   Yes Historical Provider, MD  OXYGEN Inhale 2 mLs into the lungs as needed (to keep sats above 90%).    Yes Historical Provider, MD   potassium chloride SA (K-DUR,KLOR-CON) 20 MEQ tablet Take 20 mEq by mouth at bedtime. Take one tablet by mouth once daily with food for hypokalemia   Yes Historical Provider, MD  simvastatin (ZOCOR) 10 MG tablet Take 10 mg by mouth at bedtime. 04/25/15  Yes Historical Provider, MD  simvastatin (ZOCOR) 20 MG tablet Take 30 mg by mouth daily at 6 PM. Reported on 05/23/2015   Yes Historical Provider, MD   BP 111/58 mmHg  Pulse 69  Temp(Src) 98.4 F (36.9 C) (Rectal)  Resp 27  Wt 52.5 kg  SpO2 98% Physical Exam  ED Course  Reduction of bilateral Mandible dislocation Date/Time: 05/23/2015 5:20 PM Performed by: Margaretann Loveless Authorized by: Davonna Belling Consent: Verbal consent obtained. Risks and benefits: risks, benefits and alternatives were discussed Consent given by: patient Patient understanding: patient states understanding of the procedure being performed Patient identity confirmed: verbally with patient, arm band and hospital-assigned identification number Time out: Immediately prior to procedure a "time out" was called to verify the correct patient, procedure, equipment, support staff and site/side marked as required. Patient sedated: yes Sedation type: moderate (conscious) sedation Sedatives: propofol Patient tolerance: Patient tolerated the procedure well with no immediate complications   (including critical care time) Labs Review Labs Reviewed  COMPREHENSIVE METABOLIC PANEL - Abnormal; Notable for the following:    Glucose, Bld 116 (*)    Albumin 3.1 (*)    All other components within normal limits  CBC WITH DIFFERENTIAL/PLATELET - Abnormal; Notable for the following:    RBC 5.21 (*)    Hemoglobin 15.1 (*)    HCT 46.7 (*)    Eosinophils Absolute 1.8 (*)    All other components within normal limits  URINALYSIS, ROUTINE W REFLEX MICROSCOPIC (NOT AT [facility])    Imaging Review Dg Chest Portable 1 View  05/23/2015  CLINICAL DATA:  Cough. EXAM: PORTABLE CHEST 1 VIEW  COMPARISON:  June 20, 2014. FINDINGS: The heart size and mediastinal contours are within normal limits. Both lungs are clear. Left-sided pacemaker is unchanged. The visualized skeletal structures are unremarkable. IMPRESSION: No acute cardiopulmonary abnormality seen. Electronically Signed   By: Marijo Conception, M.D.   On: 05/23/2015 14:52   I have personally reviewed and evaluated these images and lab results as part of my medical decision-making.   EKG Interpretation None      MDM   Final diagnoses:  Dislocation of mandible, initial encounter        Margaretann Loveless, MD 05/23/15 [place], MD 05/24/15 [phone]

## 2015-05-23 NOTE — ED Notes (Signed)
Presents from [place] place with c/o Jaw dislocation. Per EMS, pt has this happen often. USes 3 liters O2 at all times, thick copious secretions and productive cough. Breath sounds with left sided expiratory wheezes. Pt is not interactive, per PTAR that is abnormal. Will open eyes and make eye contact, usually tries to speak and nods.

## 2015-05-23 NOTE — Discharge Instructions (Signed)
Jaw Dislocation A jaw dislocation is the displacement of the joint where the upper jaw bone (maxilla) and the lower jaw bone (mandibula) meet (temporomandibular joint). Soon after the dislocation, the jaw muscles tighten. This prevents the mouth from closing normally.  CAUSES A jaw dislocation usually is caused by a sudden forceful impact to the jaw. A strong punch in the jaw during a fist fight or a sports injury are examples of causes of jaw dislocation. Another cause is injury due to car or motorcycle accidents. RISK FACTORS Although anyone can have a jaw dislocation, some people are more at risk than others. People at increased risk for jaw dislocation include participants in contact sports. SYMPTOMS Symptoms of jaw dislocation can vary, depending on the severity of the dislocation. They can include:  Feeling that your teeth are out of alignment when you bite.  Inability to close your mouth completely.  Drooling.  Extreme pain, with the inability to move your jaw. DIAGNOSIS Your caregiver will feel your temporomandibular joints and ask you to move your jaw. Your caregiver also will feel the inside of your mouth to make sure there are no fractures or cuts (lacerations). TREATMENT Your caregiver will manipulate your jaw to put it back into place (reduction). If you have any jaw fractures from the dislocation, they usually will be held in place with plates and screws or with wiring.  HOME CARE INSTRUCTIONS The following measures can help to reduce pain and hasten the healing process:  Rest your injured joint. Do not move it. Avoid activities similar to the one that caused your injury.  Apply ice to your injured joint for 1 to 2 days after your reduction or as directed by your caregiver. Applying ice helps to reduce inflammation and pain.  Put ice in a plastic bag.  Place a towel between your skin and the bag.  Leave the ice on for 15-20 minutes at a time, 03-04 times a day.  Take  over-the-counter or prescription medication for pain as directed by your caregiver. Also, your caregiver may instruct you to only have certain foods until your jaw heals. These foods may be soft or liquified so that your jaw does not have to move much to eat them. SEEK IMMEDIATE MEDICAL CARE IF:  You have plates and screws or wiring to hold your jaw together that becomes loose or damaged.  You develop drainage from any of the cuts (incisions) where your wires or plates and screws were placed.  Your pain becomes worse rather than better. MAKE SURE YOU:  Understand these instructions.  Will watch your condition.  Will get help right away if you are not doing well or get worse.   This information is not intended to replace advice given to you by your health care provider. Make sure you discuss any questions you have with your health care provider.   Document Released: 02/05/2000 Document Revised: 05/02/2011 Document Reviewed: 07/07/2014 Elsevier Interactive Patient Education [employer].

## 2015-05-23 NOTE — ED Notes (Signed)
Attempted to contact facility to give report.  Transferred with no answer.

## 2015-05-23 NOTE — ED Notes (Signed)
PTAR called  

## 2015-06-01 ENCOUNTER — Encounter: Payer: Self-pay | Admitting: Internal Medicine

## 2015-06-01 ENCOUNTER — Non-Acute Institutional Stay (SKILLED_NURSING_FACILITY): Payer: Medicare Other | Admitting: Internal Medicine

## 2015-06-01 DIAGNOSIS — E46 Unspecified protein-calorie malnutrition: Secondary | ICD-10-CM

## 2015-06-01 DIAGNOSIS — I10 Essential (primary) hypertension: Secondary | ICD-10-CM | POA: Diagnosis not present

## 2015-06-01 DIAGNOSIS — R5383 Other fatigue: Secondary | ICD-10-CM

## 2015-06-01 DIAGNOSIS — R093 Abnormal sputum: Secondary | ICD-10-CM | POA: Diagnosis not present

## 2015-06-01 DIAGNOSIS — R630 Anorexia: Secondary | ICD-10-CM | POA: Diagnosis not present

## 2015-06-01 NOTE — Progress Notes (Deleted)
Patient ID: Yvonne Buck, female   DOB: February 10, 1930, 82 y.o.   MRN: [id number]      LOCATION: Facility: [facility], [place]  Allergies  Allergen Reactions  . Penicillins Other (See Comments)    Red spots, ### Tolerated Rocephin 12/2012 ###, Has patient had a PCN reaction causing immediate rash, facial/tongue/throat swelling, SOB or lightheadedness with hypotension: Unknown Has patient had a PCN reaction causing severe rash involving mucus membranes or skin necrosis: Unknown Has patient had a PCN reaction that required hospitalization Unknown Has patient had a PCN reaction occurring within the last 10 years: Unknown If all of the above answers are "NO", then may proceed with Cephalosporin u     CODE STATUS: DNR  GOALS OF CARE: Advanced Directives 05/23/2015  Does patient have an advance directive? Yes  Type of Advance Directive Out of facility DNR (pink MOST or yellow form)  Does patient want to make changes to advanced directive? -  Copy of advanced directive(s) in chart? Yes  Pre-existing out of facility DNR order (yellow form or pink MOST form) Yellow form placed in chart (order not valid for inpatient use)     Chief Complaint  Patient presents with  . Medical Management of Chronic Issues    Routine Visit    HPI 82 y.o. female seen today for annual wellness exam. She had been at her baseline until this afternoon. She appears lethargic per staff. She was recently treated for UTI a week back. She is a high fall risk and is on a gerichair. No recent fall reported. No new skin concern.    Health Maintenance  Topic Date Due  . DEXA SCAN  02/22/2016 (Originally 10/04/1996)  . PNA vac Low Risk Adult (2 of 2 - PCV13) 02/22/2016 (Originally 02/01/2014)  . INFLUENZA VACCINE  09/22/2015  . TETANUS/TDAP  06/07/2023  . ZOSTAVAX  Addressed   Immunization History  Administered Date(s) Administered  . Influenza-Unspecified 11/28/2013,  12/03/2014  . PPD Test 01/03/2013, 01/18/2013, 04/19/2013, 06/25/2014, 10/29/2014  . Pneumococcal-Unspecified 02/01/2013     Past Medical History  Diagnosis Date  . Tachycardia-bradycardia syndrome [facility])     s/p Chemical engineer PPM implant in [place]  . Paroxysmal atrial fibrillation (HCC)     chads2vasc score is at least 6.  She is not a candidate for anticoagulation due to falls.  Her AF burden is low.  . Hypertension   . Frequent falls   . Asthma   . Hyperlipidemia   . Depression   . Anxiety   . Pacemaker   . Stroke ([place])   . Osteoporosis 06/23/2013  . Vitamin D deficiency   . Sick sinus syndrome ([place])   . Hypothyroidism   . Chronic kidney disease   . Fracture of greater trochanter of left femur ([place]) 04/02/2013  . Hip fracture, left ([place]) 04/02/2013  . Pelvic fracture ([place]) 06/23/2013  . Senile osteoporosis 02/26/2014  . Rhinitis, allergic 02/26/2014  . TMJ (dislocation of temporomandibular joint) 06/16/2014    Past Surgical History  Procedure Laterality Date  . Pacemaker insertion  01/04/2005    [place] PPM [phone] GDT [id number] atrial lead and 4457 V lead all implanted in [place]  . Cholecystectomy    . Gallbladder surgery    . Tonsillectomy and adenoidectomy        Medication List       This list is accurate as of: 06/01/15  3:52 PM.  Always use your  most recent med list.               acetaminophen 325 MG tablet  Commonly known as:  TYLENOL  Take 650 mg by mouth every 4 (four) hours as needed for moderate pain.     amLODipine 5 MG tablet  Commonly known as:  NORVASC  Take 5 mg by mouth daily. Take one tablet by mouth once daily for hypertension     aspirin EC 81 MG tablet  Take 81 mg by mouth at bedtime. Take one tablet by mouth once daily for AFIB     cholecalciferol 1000 units tablet  Commonly known as:  VITAMIN D  Take 1,000 Units by mouth at bedtime. Take one tablet by mouth once daily for vitamin D deficiency      Fluticasone-Salmeterol 250-50 MCG/DOSE Aepb  Commonly known as:  ADVAIR  Inhale 1 puff into the lungs 2 (two) times daily. Inhale one puff into the lungs twice daily for SOB/Wheeze. Rinse mouth after use.     isosorbide mononitrate 30 MG 24 hr tablet  Commonly known as:  IMDUR  Take one tablet by mouth every morning for hypertension     losartan 100 MG tablet  Commonly known as:  COZAAR  Take 100 mg by mouth daily. Take one tablet by mouth once daily for hypertension     metoprolol 50 MG tablet  Commonly known as:  LOPRESSOR  Take 50 mg by mouth 2 (two) times daily. Take one tablet by mouth twice daily for hypertension     mirtazapine 7.5 MG tablet  Commonly known as:  REMERON  Take 7.5 mg by mouth at bedtime.     montelukast 10 MG tablet  Commonly known as:  SINGULAIR  Take 10 mg by mouth at bedtime. Take one tablet by mouth every night at bedtime for allergies     NAMENDA XR 28 MG Cp24 24 hr capsule  Generic drug:  memantine  Take 28 mg by mouth daily.     OXYGEN  Inhale 2 mLs into the lungs as needed (to keep sats above 90%).     potassium chloride SA 20 MEQ tablet  Commonly known as:  K-DUR,KLOR-CON  Take 20 mEq by mouth at bedtime. Take one tablet by mouth once daily with food for hypokalemia     simvastatin 20 MG tablet  Commonly known as:  ZOCOR  Take 30 mg by mouth daily at 6 PM. Reported on 05/23/2015     simvastatin 10 MG tablet  Commonly known as:  ZOCOR  Take 10 mg by mouth at bedtime.         Review of Systems  Constitutional: Positive for malaise/fatigue. Negative for fever, chills and diaphoresis.  HENT: Negative.  Negative for congestion.   Eyes: Negative.  Negative for blurred vision.  Respiratory: Negative.  Negative for cough and shortness of breath.   Cardiovascular: Negative.  Negative for chest pain, palpitations and leg swelling.  Gastrointestinal: Negative.  Negative for heartburn, nausea, vomiting and abdominal pain.  Genitourinary:  Negative.   Musculoskeletal: Negative.   Skin: Negative.  Negative for itching and rash.  Neurological: Negative.  Negative for dizziness and headaches.  Endo/Heme/Allergies: Negative.   Psychiatric/Behavioral: Negative.  Negative for depression.    Physical exam:  Filed Vitals:   06/01/15 1541  BP: 130/70  Pulse: 82  Temp: 97.1 F (36.2 C)  TempSrc: Oral  Resp: 18  Height: 5\' 6"  (1.676 m)  Weight: 109 lb 3.2 oz (49.533  kg)  SpO2: 94%   Body mass index is 17.63 kg/(m^2).    Physical Exam  Constitutional:  Frail Elderly in no acute distress.  HENT:  Head: Normocephalic and atraumatic.  Nose: Nose normal.  Mouth/Throat: Oropharynx is clear and moist.  Eyes: Conjunctivae and EOM are normal. Pupils are equal, round, and reactive to light. Right eye exhibits no discharge. Left eye exhibits no discharge. No scleral icterus.  Neck: Normal range of motion. No JVD present. No thyromegaly present.  Cardiovascular: Normal rate.  Exam reveals no gallop and no friction rub.   No murmur heard. Pulmonary/Chest: Effort normal and breath sounds normal. No stridor. No respiratory distress. She has no wheezes. She has no rales. She exhibits no tenderness.  Abdominal: Soft. Bowel sounds are normal. She exhibits no distension and no mass. There is no tenderness. There is no rebound and no guarding.  Musculoskeletal: Normal range of motion. She exhibits no edema or tenderness.  Left side weakness   Lymphadenopathy:    She has no cervical adenopathy.  Neurological:  Appears lethargic and is pleasantly confused  Skin: Skin is warm and dry.  Psychiatric:  Poor insight and judgement  Vitals reviewed.   Labs reviewed:   Recent Labs  06/21/14 0507 03/12/15 1330 03/31/15 05/05/15 05/23/15 1441  NA 137 143 144 144 140  K 3.7 4.2 3.8 4.0 4.8  CL 98 104  --   --  103  CO2 29 33*  --   --  30  GLUCOSE 219* 103*  --   --  116*  BUN 34* 14 19 21 20   CREATININE 0.76 0.60 0.6 0.6 0.75    CALCIUM 8.9 9.3  --   --  9.4    Recent Labs  06/19/14 0418 06/21/14 0507 05/05/15 05/23/15 1441  AST 19 20 12* 21  ALT 21 30 14 29   ALKPHOS 68 62 70 77  BILITOT 0.5 0.4  --  1.0  PROT 7.1 6.2  --  6.5  ALBUMIN 3.6 2.9*  --  3.1*    Recent Labs  06/20/14 1633 06/21/14 0507 03/12/15 1330 03/18/15 05/05/15 05/23/15 1441  WBC 14.3* 9.2 9.3 9.1 7.7 8.7  NEUTROABS 8.7* 8.1*  --   --   --  4.5  HGB 16.0* 14.4 14.5 14.1 13.6 15.1*  HCT 50.3* 45.2 46.0 46 43 46.7*  MCV 88.6 88.1 90.7  --   --  89.6  PLT 381 327 281 280 255 322    Lab Results  Component Value Date   TSH 0.57 05/05/2015   Lab Results  Component Value Date   HGBA1C 6.1 03/31/2015     Assessment/plan  Annual exam Get FOBT for colon cancer screening. uptodate with immunization. Reviewed labs. Continue assistance with ADLs. Fall precautions  afib Rate controlled. Continue lopressor 50 mg bid. Continue baby aspirin  Hyperlipidemia Lipid Panel     Component Value Date/Time   CHOL 165 05/05/2015   TRIG 71 05/05/2015   HDL 49 05/05/2015   LDLCALC 102 05/05/2015   LDLDIRECT 83 12/10/2010 1018  check lipid panel. Continue simvastatin 20 mg daily  Vitamin d deficiency Continue vitamin d supplement  Chronic asthma Stable, continue advair and singulair with duoneb prn  Protein calorie malnutrition Continue fortified food, followed by dietary team. Continue remeron for appetite for now. Monitor weight.  Essential HTN Continue norvasc, imdur, losartan and metoprolol for now and monitor bp readings.   Diastolic chf Monitor clinically and continue losartan, metoprolol and imdur. Reviewed  BMP  Dementia No behavioral disturbance. continue namenda xr for now. Continue assistance with ADLs. Continue fall precautions and pressure ulcer prophylaxis.   Blanchie Serve, MD  [facility] [phone] (Monday-Friday 8 am - 5 pm) [phone] (afterhours)

## 2015-06-01 NOTE — Progress Notes (Signed)
Patient ID: Yvonne Buck, female   DOB: [birth year], 82 y.o.   MRN: [id number]      [facility]  Code Status: DNR  Chief Complaint  Patient presents with  . Medical Management of Chronic Issues    Routine Visit    Allergies  Allergen Reactions  . Penicillins Other (See Comments)    Red spots, ### Tolerated Rocephin 12/2012 ###, Has patient had a PCN reaction causing immediate rash, facial/tongue/throat swelling, SOB or lightheadedness with hypotension: Unknown Has patient had a PCN reaction causing severe rash involving mucus membranes or skin necrosis: Unknown Has patient had a PCN reaction that required hospitalization Unknown Has patient had a PCN reaction occurring within the last 10 years: Unknown If all of the above answers are "NO", then may proceed with Cephalosporin u    HPI:   82 y/o female patient is seen today for routine visit. She denies any concerns. Per staff, her appetite has been poor for last 2 weeks. Her energy level has been low and has poor participation with restorative team x 1 week. No fall reported. No new skin concern. Per staff, she has been spitting out greenish mucus. On chart review, has been losing weight.   Review of Systems:  Constitutional: Negative for fever, chills, diaphoresis.   Respiratory: Negative for cough, shortness of breath and wheezing.    Cardiovascular: Negative for chest pain, palpitations Skin: Negative for itching and rash.   GI: negative for abdominal pain, nausea or vomiting, diarrhea  Past medical history reviewed  Medication reviewed. See [facility]   Medication List       This list is accurate as of: 06/01/15  4:39 PM.  Always use your most recent med list.               acetaminophen 325 MG tablet  Commonly known as:  TYLENOL  Take 650 mg by mouth every 4 (four) hours as needed for moderate pain.     amLODipine 5 MG tablet  Commonly known as:  NORVASC  Take 5 mg by mouth daily. Take one tablet by  mouth once daily for hypertension     aspirin EC 81 MG tablet  Take 81 mg by mouth at bedtime. Take one tablet by mouth once daily for AFIB     cholecalciferol 1000 units tablet  Commonly known as:  VITAMIN D  Take 1,000 Units by mouth at bedtime. Take one tablet by mouth once daily for vitamin D deficiency     Fluticasone-Salmeterol 250-50 MCG/DOSE Aepb  Commonly known as:  ADVAIR  Inhale 1 puff into the lungs 2 (two) times daily. Inhale one puff into the lungs twice daily for SOB/Wheeze. Rinse mouth after use.     isosorbide mononitrate 30 MG 24 hr tablet  Commonly known as:  IMDUR  Take one tablet by mouth every morning for hypertension     losartan 100 MG tablet  Commonly known as:  COZAAR  Take 100 mg by mouth daily. Take one tablet by mouth once daily for hypertension     metoprolol 50 MG tablet  Commonly known as:  LOPRESSOR  Take 50 mg by mouth 2 (two) times daily. Take one tablet by mouth twice daily for hypertension     mirtazapine 7.5 MG tablet  Commonly known as:  REMERON  Take 7.5 mg by mouth at bedtime.     montelukast 10 MG tablet  Commonly known as:  SINGULAIR  Take 10 mg by mouth at  bedtime. Take one tablet by mouth every night at bedtime for allergies     NAMENDA XR 28 MG Cp24 24 hr capsule  Generic drug:  memantine  Take 28 mg by mouth daily.     OXYGEN  Inhale 2 mLs into the lungs as needed (to keep sats above 90%).     potassium chloride SA 20 MEQ tablet  Commonly known as:  K-DUR,KLOR-CON  Take 20 mEq by mouth at bedtime. Take one tablet by mouth once daily with food for hypokalemia     simvastatin 20 MG tablet  Commonly known as:  ZOCOR  Take 30 mg by mouth daily at 6 PM. Reported on 05/23/2015     simvastatin 10 MG tablet  Commonly known as:  ZOCOR  Take 10 mg by mouth at bedtime.        Physical exam BP 130/70 mmHg  Pulse 82  Temp(Src) 97.1 F (36.2 C) (Oral)  Resp 18  Ht 5\' 6"  (1.676 m)  Wt 109 lb 3.2 oz (49.533 kg)  BMI 17.63  kg/m2  SpO2 94%  Wt Readings from Last 3 Encounters:  06/01/15 109 lb 3.2 oz (49.533 kg)  05/23/15 115 lb 11.9 oz (52.5 kg)  04/23/15 115 lb 12.8 oz (52.527 kg)   Body mass index is 17.63 kg/(m^2).  Constitutional: elderly, frail female and in no acute distress.   HEENT: MMM, PERRLA, EOMI, no cervical lymphadenopathy, able to open and close her mouth without difficulty Cardiovascular: Normal rate, regular rhythm and intact distal pulses.    Respiratory: Effort normal and breath sounds normal. No respiratory distress. She has no wheezes.   Musculoskeletal: able to move all 4 extremities, on geri chair, muscle wasting present Neurological: She is alert, oriented to person only Psychiatric: poor insight and judgement  Labs CBC Latest Ref Rng 05/23/2015 05/05/2015 03/18/2015  WBC 4.0 - 10.5 K/uL 8.7 7.7 9.1  Hemoglobin 12.0 - 15.0 g/dL 15.1(H) 13.6 14.1  Hematocrit 36.0 - 46.0 % 46.7(H) 43 46  Platelets 150 - 400 K/uL 322 255 280   CMP Latest Ref Rng 05/23/2015 05/05/2015 03/31/2015  Glucose 65 - 99 mg/dL 116(H) - -  BUN 6 - 20 mg/dL 20 21 19   Creatinine 0.44 - 1.00 mg/dL 0.75 0.6 0.6  Sodium 135 - 145 mmol/L 140 144 144  Potassium 3.5 - 5.1 mmol/L 4.8 4.0 3.8  Chloride 101 - 111 mmol/L 103 - -  CO2 22 - 32 mmol/L 30 - -  Calcium 8.9 - 10.3 mg/dL 9.4 - -  Total Protein 6.5 - 8.1 g/dL 6.5 - -  Total Bilirubin 0.3 - 1.2 mg/dL 1.0 - -  Alkaline Phos 38 - 126 U/L 77 70 -  AST 15 - 41 U/L 21 12(A) -  ALT 14 - 54 U/L 29 14 -   Lipid Panel     Component Value Date/Time   CHOL 165 05/05/2015   TRIG 71 05/05/2015   HDL 49 05/05/2015   LDLCALC 102 05/05/2015   LDLDIRECT 83 12/10/2010 1018    Assessment/plan  Protein calorie malnutrition Losing weight. Increase remeron to 15 mg daily to help stimulate her appetite. Continue mechanical soft diet, aspiration precautions. Monitor weight. Decline anticipated with her dementia  Poor appetite Increase dosing of remeron as above  Low  energy level Poor participation in restorative therapy. Will have PT evaluate her. Recent blood work shows normal wbc and renal function. Check u/a with c/s and CXR to assess further given her greenish sputum and high  aspiration risk. Check cbc with diff and cmp  Sputum production Increased per staff. Get CXR to rule out infiltrate. Aspiration precautions and assistance with meals for now. Continue her asthma medication  HTN Stable, continue amlodipine, losartan, imdur and metoprolol and monitor BP.    Blanchie Serve, MD  [facility] [phone] (Monday-Friday 8 am - 5 pm) [phone] (afterhours)

## 2015-06-02 LAB — HEPATIC FUNCTION PANEL
ALT: 13 U/L (ref 7–35)
AST: 14 U/L (ref 13–35)
Alkaline Phosphatase: 75 U/L (ref 25–125)
Bilirubin, Total: 0.6 mg/dL

## 2015-06-02 LAB — BASIC METABOLIC PANEL
BUN: 12 mg/dL (ref 4–21)
CREATININE: 0.6 mg/dL (ref 0.5–1.1)
Glucose: 94 mg/dL
POTASSIUM: 4.3 mmol/L (ref 3.4–5.3)
Sodium: 142 mmol/L (ref 137–147)

## 2015-06-02 LAB — CBC AND DIFFERENTIAL
HCT: 44 % (ref 36–46)
Hemoglobin: 13.5 g/dL (ref 12.0–16.0)
Platelets: 302 10*3/uL (ref 150–399)
WBC: 6.7 10*3/mL

## 2015-06-08 LAB — HM DIABETES FOOT EXAM

## 2015-06-15 ENCOUNTER — Encounter: Payer: Self-pay | Admitting: Internal Medicine

## 2015-06-15 ENCOUNTER — Ambulatory Visit (INDEPENDENT_AMBULATORY_CARE_PROVIDER_SITE_OTHER): Payer: Medicare Other | Admitting: *Deleted

## 2015-06-15 DIAGNOSIS — Z950 Presence of cardiac pacemaker: Secondary | ICD-10-CM

## 2015-06-15 DIAGNOSIS — I495 Sick sinus syndrome: Secondary | ICD-10-CM | POA: Diagnosis not present

## 2015-06-15 LAB — CUP PACEART INCLINIC DEVICE CHECK
Brady Statistic RA Percent Paced: 28 %
Brady Statistic RV Percent Paced: 1 %
Date Time Interrogation Session: 20170424040000
Implantable Lead Implant Date: 20061114
Implantable Lead Implant Date: 20061114
Implantable Lead Location: 753857
Implantable Lead Location: 753860
Implantable Lead Model: 4457
Implantable Lead Model: 4480
Implantable Lead Serial Number: 470623
Implantable Lead Serial Number: 520676
Lead Channel Impedance Value: 400 Ohm
Lead Channel Impedance Value: 500 Ohm
Lead Channel Pacing Threshold Amplitude: 0.4 V
Lead Channel Pacing Threshold Amplitude: 0.6 V
Lead Channel Pacing Threshold Pulse Width: 0.4 ms
Lead Channel Pacing Threshold Pulse Width: 0.4 ms
Lead Channel Sensing Intrinsic Amplitude: 3 mV
Lead Channel Sensing Intrinsic Amplitude: 6 mV
Lead Channel Setting Pacing Amplitude: 2 V
Lead Channel Setting Pacing Amplitude: 2.4 V
Lead Channel Setting Pacing Pulse Width: 0.5 ms
Lead Channel Setting Sensing Sensitivity: 1 mV
Pulse Gen Model: 1290
Pulse Gen Serial Number: 764926

## 2015-06-15 NOTE — Progress Notes (Signed)
Pacemaker check in clinic. Normal device function. Thresholds, sensing, impedances consistent with previous measurements. Device programmed to maximize longevity. 1 8 sec ATR episode- AT. No high ventricular rates noted. Device programmed at appropriate safety margins. Histogram distribution appropriate for patient activity level. Device programmed to optimize intrinsic conduction. Estimated longevity <0.5 year. ROV with device clinic in 2 months for close follow up due to battery (no charge, next billable 09/14/15). ROV with JA in January 2018.

## 2015-06-22 ENCOUNTER — Encounter: Payer: Self-pay | Admitting: Internal Medicine

## 2015-06-22 ENCOUNTER — Non-Acute Institutional Stay (SKILLED_NURSING_FACILITY): Payer: Medicare Other | Admitting: Internal Medicine

## 2015-06-22 DIAGNOSIS — E43 Unspecified severe protein-calorie malnutrition: Secondary | ICD-10-CM

## 2015-06-22 DIAGNOSIS — E559 Vitamin D deficiency, unspecified: Secondary | ICD-10-CM

## 2015-06-22 DIAGNOSIS — R1310 Dysphagia, unspecified: Secondary | ICD-10-CM | POA: Diagnosis not present

## 2015-06-22 NOTE — Progress Notes (Signed)
Patient ID: Yvonne Buck, female   DOB: 1929-12-17, 82 y.o.   MRN: [id number]      [facility]  Code Status: DNR  Chief Complaint  Patient presents with  . Medical Management of Chronic Issues    Routine Visit    Allergies  Allergen Reactions  . Penicillins Other (See Comments)    Red spots, ### Tolerated Rocephin 12/2012 ###, Has patient had a PCN reaction causing immediate rash, facial/tongue/throat swelling, SOB or lightheadedness with hypotension: Unknown Has patient had a PCN reaction causing severe rash involving mucus membranes or skin necrosis: Unknown Has patient had a PCN reaction that required hospitalization Unknown Has patient had a PCN reaction occurring within the last 10 years: Unknown If all of the above answers are "NO", then may proceed with Cephalosporin u    HPI:   82 y/o female patient is seen today for routine visit. No new concern this visit. Continues to be on feeding supplement. She is on mechanical soft diet. bp reading have been stable. No fall reported. Continues to lose weight.    Review of Systems:  Constitutional: Negative for fever   Respiratory: Negative for cough, shortness of breath and wheezing.    Cardiovascular: Negative for chest pain, palpitations Skin: Negative for itching and rash.   GI: negative for abdominal pain, nausea or vomiting, diarrhea  Past medical history reviewed  Medication reviewed. See [facility]   Medication List       This list is accurate as of: 06/22/15  1:08 PM.  Always use your most recent med list.               acetaminophen 325 MG tablet  Commonly known as:  TYLENOL  Take 650 mg by mouth every 4 (four) hours as needed for moderate pain.     amLODipine 5 MG tablet  Commonly known as:  NORVASC  Take 5 mg by mouth daily. Take one tablet by mouth once daily for hypertension     aspirin EC 81 MG tablet  Take 81 mg by mouth at bedtime. Take one tablet by mouth once daily for AFIB     cholecalciferol 1000 units tablet  Commonly known as:  VITAMIN D  Take 1,000 Units by mouth at bedtime. Take one tablet by mouth once daily for vitamin D deficiency     Fluticasone-Salmeterol 250-50 MCG/DOSE Aepb  Commonly known as:  ADVAIR  Inhale 1 puff into the lungs 2 (two) times daily. Inhale one puff into the lungs twice daily for SOB/Wheeze. Rinse mouth after use.     isosorbide mononitrate 30 MG 24 hr tablet  Commonly known as:  IMDUR  Take one tablet by mouth every morning for hypertension     losartan 100 MG tablet  Commonly known as:  COZAAR  Take 100 mg by mouth daily. Take one tablet by mouth once daily for hypertension     metoprolol 50 MG tablet  Commonly known as:  LOPRESSOR  Take 50 mg by mouth 2 (two) times daily. Take one tablet by mouth twice daily for hypertension     mirtazapine 15 MG tablet  Commonly known as:  REMERON  Take 15 mg by mouth daily.     montelukast 10 MG tablet  Commonly known as:  SINGULAIR  Take 10 mg by mouth at bedtime. Take one tablet by mouth every night at bedtime for allergies     NAMENDA XR 28 MG Cp24 24 hr capsule  Generic drug:  memantine  Take 28 mg by mouth daily.     OXYGEN  Inhale 2 mLs into the lungs as needed (to keep sats above 90%).     potassium chloride SA 20 MEQ tablet  Commonly known as:  K-DUR,KLOR-CON  Take 20 mEq by mouth at bedtime. Take one tablet by mouth once daily with food for hypokalemia     simvastatin 20 MG tablet  Commonly known as:  ZOCOR  Take 30 mg by mouth daily at 6 PM. Reported on 05/23/2015     simvastatin 10 MG tablet  Commonly known as:  ZOCOR  Take 10 mg by mouth at bedtime. Reported on 06/15/2015     UNABLE TO FIND  Med Name: Med pass 120 mL 2 times daily        Physical exam BP 140/70 mmHg  Pulse 60  Temp(Src) 98.8 F (37.1 C) (Oral)  Resp 18  Ht 5\' 6"  (1.676 m)  Wt 109 lb 3.2 oz (49.533 kg)  BMI 17.63 kg/m2  SpO2 95%  Wt Readings from Last 3 Encounters:  06/22/15 109 lb  3.2 oz (49.533 kg)  06/01/15 109 lb 3.2 oz (49.533 kg)  05/23/15 115 lb 11.9 oz (52.5 kg)   Body mass index is 17.63 kg/(m^2).  Constitutional: elderly, frail female and in no acute distress.   HEENT: MMM, PERRLA, EOMI, no cervical lymphadenopathy Cardiovascular: Normal rate, regular rhythm and intact distal pulses.    Respiratory: Effort normal and breath sounds normal. No respiratory distress. She has no wheezes.   Musculoskeletal: able to move all 4 extremities, on geri chair, muscle wasting present Neurological: She is alert, oriented to person only Psychiatric: poor insight and judgement   Labs CBC Latest Ref Rng 06/02/2015 05/23/2015 05/05/2015  WBC - 6.7 8.7 7.7  Hemoglobin 12.0 - 16.0 g/dL 13.5 15.1(H) 13.6  Hematocrit 36 - 46 % 44 46.7(H) 43  Platelets 150 - 399 K/L 302 322 255   CMP Latest Ref Rng 06/02/2015 05/23/2015 05/05/2015  Glucose 65 - 99 mg/dL - 116(H) -  BUN 4 - 21 mg/dL 12 20 21   Creatinine 0.5 - 1.1 mg/dL 0.6 0.75 0.6  Sodium 137 - 147 mmol/L 142 140 144  Potassium 3.4 - 5.3 mmol/L 4.3 4.8 4.0  Chloride 101 - 111 mmol/L - 103 -  CO2 22 - 32 mmol/L - 30 -  Calcium 8.9 - 10.3 mg/dL - 9.4 -  Total Protein 6.5 - 8.1 g/dL - 6.5 -  Total Bilirubin 0.3 - 1.2 mg/dL - 1.0 -  Alkaline Phos 25 - 125 U/L 75 77 70  AST 13 - 35 U/L 14 21 12(A)  ALT 7 - 35 U/L 13 29 14    Lipid Panel     Component Value Date/Time   CHOL 165 05/05/2015   TRIG 71 05/05/2015   HDL 49 05/05/2015   LDLCALC 102 05/05/2015   LDLDIRECT 83 12/10/2010 1018    Assessment/plan  Dysphagia Continue mechanical soft diet.   Protein calorie malnutrition Continues to lose weight. Currently on remeron15 mg daily to help stimulate her appetite. Continue mechanical soft diet, aspiration precautions. Monitor weight. Decline anticipated with her dementia  Vitamin d def Continue vitamin d supplement   Blanchie Serve, MD  [facility] [phone] (Monday-Friday 8 am - 5  pm) [phone] (afterhours)

## 2015-07-24 ENCOUNTER — Emergency Department (HOSPITAL_COMMUNITY): Payer: Medicare Other

## 2015-07-24 ENCOUNTER — Encounter (HOSPITAL_COMMUNITY): Payer: Self-pay | Admitting: Emergency Medicine

## 2015-07-24 ENCOUNTER — Emergency Department (HOSPITAL_COMMUNITY)
Admission: EM | Admit: 2015-07-24 | Discharge: 2015-07-24 | Disposition: A | Discharge status: Home / Self Care | Payer: Medicare Other | Attending: Emergency Medicine | Admitting: Emergency Medicine

## 2015-07-24 DIAGNOSIS — Z950 Presence of cardiac pacemaker: Secondary | ICD-10-CM | POA: Diagnosis not present

## 2015-07-24 DIAGNOSIS — J45909 Unspecified asthma, uncomplicated: Secondary | ICD-10-CM | POA: Diagnosis not present

## 2015-07-24 DIAGNOSIS — Y9389 Activity, other specified: Secondary | ICD-10-CM | POA: Diagnosis not present

## 2015-07-24 DIAGNOSIS — I480 Paroxysmal atrial fibrillation: Secondary | ICD-10-CM | POA: Insufficient documentation

## 2015-07-24 DIAGNOSIS — S4991XA Unspecified injury of right shoulder and upper arm, initial encounter: Secondary | ICD-10-CM | POA: Insufficient documentation

## 2015-07-24 DIAGNOSIS — Y998 Other external cause status: Secondary | ICD-10-CM | POA: Insufficient documentation

## 2015-07-24 DIAGNOSIS — X58XXXA Exposure to other specified factors, initial encounter: Secondary | ICD-10-CM | POA: Diagnosis not present

## 2015-07-24 DIAGNOSIS — F419 Anxiety disorder, unspecified: Secondary | ICD-10-CM | POA: Diagnosis not present

## 2015-07-24 DIAGNOSIS — R52 Pain, unspecified: Secondary | ICD-10-CM

## 2015-07-24 DIAGNOSIS — Z9181 History of falling: Secondary | ICD-10-CM | POA: Insufficient documentation

## 2015-07-24 DIAGNOSIS — E785 Hyperlipidemia, unspecified: Secondary | ICD-10-CM | POA: Diagnosis not present

## 2015-07-24 DIAGNOSIS — F329 Major depressive disorder, single episode, unspecified: Secondary | ICD-10-CM | POA: Insufficient documentation

## 2015-07-24 DIAGNOSIS — N189 Chronic kidney disease, unspecified: Secondary | ICD-10-CM | POA: Diagnosis not present

## 2015-07-24 DIAGNOSIS — S0303XA Dislocation of jaw, bilateral, initial encounter: Secondary | ICD-10-CM | POA: Diagnosis not present

## 2015-07-24 DIAGNOSIS — S0993XA Unspecified injury of face, initial encounter: Secondary | ICD-10-CM | POA: Diagnosis present

## 2015-07-24 DIAGNOSIS — Z79899 Other long term (current) drug therapy: Secondary | ICD-10-CM | POA: Diagnosis not present

## 2015-07-24 DIAGNOSIS — I129 Hypertensive chronic kidney disease with stage 1 through stage 4 chronic kidney disease, or unspecified chronic kidney disease: Secondary | ICD-10-CM | POA: Insufficient documentation

## 2015-07-24 DIAGNOSIS — Z8781 Personal history of (healed) traumatic fracture: Secondary | ICD-10-CM | POA: Insufficient documentation

## 2015-07-24 DIAGNOSIS — Z880 Allergy status to penicillin: Secondary | ICD-10-CM | POA: Diagnosis not present

## 2015-07-24 DIAGNOSIS — M25511 Pain in right shoulder: Secondary | ICD-10-CM

## 2015-07-24 DIAGNOSIS — S0300XA Dislocation of jaw, unspecified side, initial encounter: Secondary | ICD-10-CM

## 2015-07-24 DIAGNOSIS — E559 Vitamin D deficiency, unspecified: Secondary | ICD-10-CM | POA: Diagnosis not present

## 2015-07-24 DIAGNOSIS — Z7982 Long term (current) use of aspirin: Secondary | ICD-10-CM | POA: Insufficient documentation

## 2015-07-24 DIAGNOSIS — M810 Age-related osteoporosis without current pathological fracture: Secondary | ICD-10-CM | POA: Insufficient documentation

## 2015-07-24 DIAGNOSIS — Y9289 Other specified places as the place of occurrence of the external cause: Secondary | ICD-10-CM | POA: Insufficient documentation

## 2015-07-24 NOTE — ED Notes (Signed)
Jaw manually reduced by Dr. Jeanell Sparrow at bedside. Pt in no apparent distress at present. Coban wrapped around pts head to keep jaw in place.

## 2015-07-24 NOTE — ED Notes (Signed)
Report called to [facility].

## 2015-07-24 NOTE — ED Provider Notes (Signed)
CSN: [id number]     Arrival date & time 07/24/15  [id number] History   First MD Initiated Contact with Patient 07/24/15 1003     Chief Complaint  Patient presents with  . Jaw Pain    Level 5 caveat dementia (HPI  82 year old female presents from [facility] with complaint of jaw dislocation. Patient does not communicate with my questioning by head nodding. Her  mouth is in an open position. Per EMS patient frequent has this problem. No other information is obtained from the patient or send from facility. ED visits show frequent complaint of jaw dislocation.  Past Medical History  Diagnosis Date  . Tachycardia-bradycardia syndrome [facility])     s/p Chemical engineer PPM implant in [place]  . Paroxysmal atrial fibrillation (HCC)     chads2vasc score is at least 6.  She is not a candidate for anticoagulation due to falls.  Her AF burden is low.  . Hypertension   . Frequent falls   . Asthma   . Hyperlipidemia   . Depression   . Anxiety   . Pacemaker   . Stroke ([place])   . Osteoporosis 06/23/2013  . Vitamin D deficiency   . Sick sinus syndrome ([place])   . Hypothyroidism   . Chronic kidney disease   . Fracture of greater trochanter of left femur ([place]) 04/02/2013  . Hip fracture, left ([place]) 04/02/2013  . Pelvic fracture ([place]) 06/23/2013  . Senile osteoporosis 02/26/2014  . Rhinitis, allergic 02/26/2014  . TMJ (dislocation of temporomandibular joint) 06/16/2014   Past Surgical History  Procedure Laterality Date  . Pacemaker insertion  01/04/2005    [place] PPM 1290 [phone]), GDT [phone] atrial lead and 4457 V lead all implanted in [place]  . Cholecystectomy    . Gallbladder surgery    . Tonsillectomy and adenoidectomy     Family History  Problem Relation Age of Onset  . Diabetes Mother   . High blood pressure Mother   . Heart Problems Father    Social History  Substance Use Topics  . Smoking status: Never Smoker   . Smokeless tobacco: Never Used  . Alcohol Use: No   OB History    No data available     Review of Systems  Unable to perform ROS: Dementia      Allergies  Penicillins  Home Medications   Prior to Admission medications   Medication Sig Start Date End Date Taking? Authorizing Provider  acetaminophen (TYLENOL) 325 MG tablet Take 650 mg by mouth every 4 (four) hours as needed for moderate pain.    Historical Provider, MD  amLODipine (NORVASC) 5 MG tablet Take 5 mg by mouth daily. Take one tablet by mouth once daily for hypertension 08/21/14   Historical Provider, MD  aspirin EC 81 MG tablet Take 81 mg by mouth at bedtime. Take one tablet by mouth once daily for AFIB    Historical Provider, MD  cholecalciferol (VITAMIN D) 1000 UNITS tablet Take 1,000 Units by mouth at bedtime. Take one tablet by mouth once daily for vitamin D deficiency    Historical Provider, MD  Fluticasone-Salmeterol (ADVAIR) 250-50 MCG/DOSE AEPB Inhale 1 puff into the lungs 2 (two) times daily. Inhale one puff into the lungs twice daily for SOB/Wheeze. Rinse mouth after use.    Historical Provider, MD  isosorbide mononitrate (IMDUR) 30 MG 24 hr tablet Take one tablet by mouth every morning for hypertension    Historical Provider, MD  losartan (COZAAR) 100 MG tablet  Take 100 mg by mouth daily. Take one tablet by mouth once daily for hypertension    Historical Provider, MD  memantine (NAMENDA XR) 28 MG CP24 24 hr capsule Take 28 mg by mouth daily.    Historical Provider, MD  metoprolol (LOPRESSOR) 50 MG tablet Take 50 mg by mouth 2 (two) times daily. Take one tablet by mouth twice daily for hypertension    Historical Provider, MD  mirtazapine (REMERON) 15 MG tablet Take 15 mg by mouth daily.    Historical Provider, MD  montelukast (SINGULAIR) 10 MG tablet Take 10 mg by mouth at bedtime. Take one tablet by mouth every night at bedtime for allergies    Historical Provider, MD  OXYGEN Inhale 2 mLs into the lungs as needed (to keep sats above 90%).     Historical Provider, MD  potassium  chloride SA (K-DUR,KLOR-CON) 20 MEQ tablet Take 20 mEq by mouth at bedtime. Take one tablet by mouth once daily with food for hypokalemia    Historical Provider, MD  simvastatin (ZOCOR) 10 MG tablet Take 10 mg by mouth at bedtime. Reported on 06/15/2015 04/25/15   Historical Provider, MD  simvastatin (ZOCOR) 20 MG tablet Take 30 mg by mouth daily at 6 PM. Reported on 05/23/2015    Historical Provider, MD  [place] Name: Med pass 120 mL 2 times daily    Historical Provider, MD   BP 187/81 mmHg  Pulse 59  Temp(Src) 98.5 F (36.9 C) (Temporal)  Resp 16  SpO2 96% Physical Exam  Constitutional: She appears well-developed and well-nourished. She appears distressed.  HENT:  Right Ear: External ear normal.  Left Ear: External ear normal.  Jaw open with palpable dislocation bilaterally.   Eyes: Pupils are equal, round, and reactive to light.  Neck: Normal range of motion.  Cardiovascular: Normal rate.   Pulmonary/Chest: Effort normal.  Abdominal: Soft.  Musculoskeletal:  C.o. Pain right shoulder  Neurological: She is alert.  Skin: Skin is warm and dry.  Nursing note and vitals reviewed.   ED Course  Reduction of dislocation Date/Time: 07/24/2015 12:26 PM Performed by: Pattricia Boss Authorized by: Pattricia Boss Consent: The procedure was performed in an emergent situation. Verbal consent obtained. Risks and benefits: risks, benefits and alternatives were discussed Consent given by: bilateral mandible dislocation reduced with manual  manipulation. Local anesthesia used: no Patient sedated: no Patient tolerance: Patient tolerated the procedure well with no immediate complications Comments: Patient able to close mouth after reduction- post reduction film with good joint placement.    (including critical care time) Labs Review Labs Reviewed - No data to display  Imaging Review No results found. I have personally reviewed and evaluated these images and lab results as part of my  medical decision-making.   EKG Interpretation None      MDM   Final diagnoses:  Dislocated mandible, initial encounter  Shoulder pain, right        Pattricia Boss, MD 07/25/15 1507

## 2015-07-24 NOTE — Discharge Instructions (Signed)
Jaw Dislocation A jaw dislocation happens when your lower jaw bone separates from your upper jaw bone. This injury is often caused by a very strong force to your jaw. Your doctor will put your lower jaw bone back in place. Any broken jaw bones will be held in place with plates and screws or wiring. Wires may be left in place for 6 to 8 weeks.  HOME CARE   Rest your injured joint. Do not move it.  Put ice on your injured joint for 1 to 2 days or as told by your doctor.  Put ice in a plastic bag.  Place a towel between your skin and the bag.  Leave the ice on for 15 to 20 minutes, every 2 hours while you are awake.  Only take medicines as told by your doctor.  Only eat foods or drink liquids that your doctor tells you to. GET HELP RIGHT AWAY IF:  Your plates, screws, or wires become loose or damaged.  You see fluid or pus around the cuts where your screws, wires, or plates were placed.  Your pain becomes worse, not better. MAKE SURE YOU:  Understand these instructions.  Will watch your condition.  Will get help right away if you are not doing well or get worse.   This information is not intended to replace advice given to you by your health care provider. Make sure you discuss any questions you have with your health care provider.   Document Released: 10/20/2010 Document Revised: 05/02/2011 Document Reviewed: 07/07/2014 Elsevier Interactive Patient Education [employer].

## 2015-07-24 NOTE — ED Notes (Signed)
Pt arrives via EMS from [facility] where pt's jaw became dislocated. Per EMS, pt frequently has this problem. Pt unable to speak at present. VSS.

## 2015-07-27 ENCOUNTER — Non-Acute Institutional Stay (SKILLED_NURSING_FACILITY): Payer: Medicare Other | Admitting: Internal Medicine

## 2015-07-27 ENCOUNTER — Encounter: Payer: Self-pay | Admitting: Internal Medicine

## 2015-07-27 DIAGNOSIS — S0300XA Dislocation of jaw, unspecified side, initial encounter: Secondary | ICD-10-CM | POA: Diagnosis not present

## 2015-07-27 DIAGNOSIS — I5032 Chronic diastolic (congestive) heart failure: Secondary | ICD-10-CM | POA: Diagnosis not present

## 2015-07-27 DIAGNOSIS — S0300XS Dislocation of jaw, unspecified side, sequela: Secondary | ICD-10-CM

## 2015-07-27 DIAGNOSIS — J4520 Mild intermittent asthma, uncomplicated: Secondary | ICD-10-CM | POA: Diagnosis not present

## 2015-07-27 DIAGNOSIS — I480 Paroxysmal atrial fibrillation: Secondary | ICD-10-CM | POA: Diagnosis not present

## 2015-07-27 DIAGNOSIS — E876 Hypokalemia: Secondary | ICD-10-CM

## 2015-07-27 NOTE — Progress Notes (Signed)
Patient ID: Yvonne Buck, female   DOB: [birth year], 82 y.o.   MRN: [id number]      [facility]  Code Status: DNR  Chief Complaint  Patient presents with  . Medical Management of Chronic Issues    Allergies  Allergen Reactions  . Penicillins Other (See Comments)    Red spots, ### Tolerated Rocephin 12/2012 ###, Has patient had a PCN reaction causing immediate rash, facial/tongue/throat swelling, SOB or lightheadedness with hypotension: Unknown Has patient had a PCN reaction causing severe rash involving mucus membranes or skin necrosis: Unknown Has patient had a PCN reaction that required hospitalization Unknown Has patient had a PCN reaction occurring within the last 10 years: Unknown If all of the above answers are "NO", then may proceed with Cephalosporin u   Advanced Directives 05/23/2015  Does patient have an advance directive? Yes  Type of Advance Directive Out of facility DNR (pink MOST or yellow form)  Does patient want to make changes to advanced directive? -  Copy of advanced directive(s) in chart? Yes  Pre-existing out of facility DNR order (yellow form or pink MOST form) Yellow form placed in chart (order not valid for inpatient use)     HPI:   82 y/o female patient is seen today for routine visit. She was sent to ED on 07/24/15 for jaw dislocation. This was reduced and she was sent back to the facility. She has had 2 falls over last 3-4 days while trying to get out of the bed unassisted. No apparent injury reported by staff. She is lying in her bed today and denies any concern. She is pleasantly confused and participates minimally in HPI and ROS.   Review of Systems:  Constitutional: Negative for fever   Respiratory: Negative for cough, shortness of breath and wheezing.    Cardiovascular: Negative for chest pain, palpitations Skin: Negative for itching and rash.   GI: negative for abdominal pain, nausea or vomiting, diarrhea   Past Medical History   Diagnosis Date  . Tachycardia-bradycardia syndrome [facility])     s/p Chemical engineer PPM implant in [place]  . Paroxysmal atrial fibrillation (HCC)     chads2vasc score is at least 6.  She is not a candidate for anticoagulation due to falls.  Her AF burden is low.  . Hypertension   . Frequent falls   . Asthma   . Hyperlipidemia   . Depression   . Anxiety   . Pacemaker   . Stroke ([place])   . Osteoporosis 06/23/2013  . Vitamin D deficiency   . Sick sinus syndrome ([place])   . Hypothyroidism   . Chronic kidney disease   . Fracture of greater trochanter of left femur ([place]) 04/02/2013  . Hip fracture, left ([place]) 04/02/2013  . Pelvic fracture ([place]) 06/23/2013  . Senile osteoporosis 02/26/2014  . Rhinitis, allergic 02/26/2014  . TMJ (dislocation of temporomandibular joint) 06/16/2014     Medication reviewed. See [facility]   Medication List       This list is accurate as of: 07/27/15  2:56 PM.  Always use your most recent med list.               acetaminophen 325 MG tablet  Commonly known as:  TYLENOL  Take 650 mg by mouth every 4 (four) hours as needed for moderate pain.     amLODipine 5 MG tablet  Commonly known as:  NORVASC  Take 5 mg by mouth daily. Take one tablet by mouth once  daily for hypertension     aspirin EC 81 MG tablet  Take 81 mg by mouth at bedtime. Take one tablet by mouth once daily for AFIB     cholecalciferol 1000 units tablet  Commonly known as:  VITAMIN D  Take 1,000 Units by mouth at bedtime. Take one tablet by mouth once daily for vitamin D deficiency     Fluticasone-Salmeterol 250-50 MCG/DOSE Aepb  Commonly known as:  ADVAIR  Inhale 1 puff into the lungs 2 (two) times daily. Inhale one puff into the lungs twice daily for SOB/Wheeze. Rinse mouth after use.     isosorbide mononitrate 30 MG 24 hr tablet  Commonly known as:  IMDUR  Take one tablet by mouth every morning for hypertension     losartan 100 MG tablet  Commonly known as:  COZAAR  Take 100 mg by mouth daily.  Take one tablet by mouth once daily for hypertension     metoprolol 50 MG tablet  Commonly known as:  LOPRESSOR  Take 50 mg by mouth 2 (two) times daily. Take one tablet by mouth twice daily for hypertension     mirtazapine 15 MG tablet  Commonly known as:  REMERON  Take 15 mg by mouth daily.     montelukast 10 MG tablet  Commonly known as:  SINGULAIR  Take 10 mg by mouth at bedtime. Take one tablet by mouth every night at bedtime for allergies     NAMENDA XR 28 MG Cp24 24 hr capsule  Generic drug:  memantine  Take 28 mg by mouth daily.     OXYGEN  Inhale 2 mLs into the lungs as needed (to keep sats above 90%).     potassium chloride SA 20 MEQ tablet  Commonly known as:  K-DUR,KLOR-CON  Take 20 mEq by mouth at bedtime. Take one tablet by mouth once daily with food for hypokalemia     simvastatin 20 MG tablet  Commonly known as:  ZOCOR  Take 30 mg by mouth daily at 6 PM. Reported on 05/23/2015     simvastatin 10 MG tablet  Commonly known as:  ZOCOR  Take 10 mg by mouth at bedtime. Reported on 06/15/2015     UNABLE TO FIND  Med Name: Med pass 120 mL 2 times daily        Physical exam BP 170/88 mmHg  Pulse 66  Temp(Src) 98.5 F (36.9 C) (Oral)  Resp 20  Ht 5\' 6"  (1.676 m)  Wt 108 lb (48.988 kg)  BMI 17.44 kg/m2  SpO2 93%  Wt Readings from Last 3 Encounters:  07/27/15 108 lb (48.988 kg)  06/22/15 109 lb 3.2 oz (49.533 kg)  06/01/15 109 lb 3.2 oz (49.533 kg)   Body mass index is 17.44 kg/(m^2).  Constitutional: elderly, frail and thin built female and in no acute distress.   HEENT: MMM, PERRLA, EOMI, no cervical lymphadenopathy, able to open/ close jaw without discomfort Eyes: PERRLA, EOMI Cardiovascular: Normal rate, regular rhythm and intact distal pulses.    Respiratory: Effort normal and breath sounds normal. No respiratory distress. She has no wheezes.   Musculoskeletal: able to move all 4 extremities, on geri chair, muscle wasting present Neurological: She  is alert, oriented to person only Psychiatric: poor insight and judgement Skin: senile purpura to both arms   Labs CBC Latest Ref Rng 06/02/2015 05/23/2015 05/05/2015  WBC - 6.7 8.7 7.7  Hemoglobin 12.0 - 16.0 g/dL 13.5 15.1(H) 13.6  Hematocrit 36 - 46 % 44  46.7(H) 43  Platelets 150 - 399 K/L 302 322 255   CMP Latest Ref Rng 06/02/2015 05/23/2015 05/05/2015  Glucose 65 - 99 mg/dL - 116(H) -  BUN 4 - 21 mg/dL 12 20 21   Creatinine 0.5 - 1.1 mg/dL 0.6 0.75 0.6  Sodium 137 - 147 mmol/L 142 140 144  Potassium 3.4 - 5.3 mmol/L 4.3 4.8 4.0  Chloride 101 - 111 mmol/L - 103 -  CO2 22 - 32 mmol/L - 30 -  Calcium 8.9 - 10.3 mg/dL - 9.4 -  Total Protein 6.5 - 8.1 g/dL - 6.5 -  Total Bilirubin 0.3 - 1.2 mg/dL - 1.0 -  Alkaline Phos 25 - 125 U/L 75 77 70  AST 13 - 35 U/L 14 21 12(A)  ALT 7 - 35 U/L 13 29 14    Lipid Panel     Component Value Date/Time   CHOL 165 05/05/2015   TRIG 71 05/05/2015   HDL 49 05/05/2015   LDLCALC 102 05/05/2015   LDLDIRECT 83 12/10/2010 1018    Assessment/plan  afib Rate controlled. Continue lopressor 50 mg bid. Continue baby aspirin  Chronic asthma Stable, continue advair and singulair with duoneb prn  Diastolic chf Monitor clinically. Currently on losartan 100 mg daily, metoprolol 50 mg bid and imdur 30 mg daily.  Hypokalemia Normal k level. Not on any diuretics. Change her kcl to 10 meq daily and check bmp periodically  Joint dislocation Has had recurrent jaw dislocation, had recent reduction in ED, monitor clinically for now   [facility], MD Internal Medicine [place], [place] [address] Cell Phone (Monday-Friday 8 am - 5 pm): [phone] On Call: [phone] and follow prompts after 5 pm and on weekends Office Phone: [phone] Office Fax: [phone]

## 2015-07-30 LAB — BASIC METABOLIC PANEL
BUN: 25 mg/dL — AB (ref 4–21)
CREATININE: 0.6 mg/dL (ref 0.5–1.1)
Glucose: 99 mg/dL
POTASSIUM: 4 mmol/L (ref 3.4–5.3)
SODIUM: 148 mmol/L — AB (ref 137–147)

## 2015-07-30 LAB — CBC AND DIFFERENTIAL
HCT: 48 % — AB (ref 36–46)
Hemoglobin: 14.6 g/dL (ref 12.0–16.0)
PLATELETS: 267 10*3/uL (ref 150–399)
WBC: 6.5 10*3/mL

## 2015-08-17 ENCOUNTER — Ambulatory Visit (INDEPENDENT_AMBULATORY_CARE_PROVIDER_SITE_OTHER): Payer: Medicare Other | Admitting: Internal Medicine

## 2015-08-17 ENCOUNTER — Encounter: Payer: Self-pay | Admitting: Internal Medicine

## 2015-08-17 DIAGNOSIS — I495 Sick sinus syndrome: Secondary | ICD-10-CM

## 2015-08-17 DIAGNOSIS — I480 Paroxysmal atrial fibrillation: Secondary | ICD-10-CM | POA: Diagnosis not present

## 2015-08-17 DIAGNOSIS — Z950 Presence of cardiac pacemaker: Secondary | ICD-10-CM | POA: Diagnosis not present

## 2015-08-17 LAB — CUP PACEART INCLINIC DEVICE CHECK
Brady Statistic RV Percent Paced: 2 %
Date Time Interrogation Session: 20170626040000
Implantable Lead Implant Date: 20061114
Lead Channel Setting Pacing Amplitude: 2 V
Lead Channel Setting Pacing Pulse Width: 0.5 ms
Lead Channel Setting Sensing Sensitivity: 1 mV
MDC IDC LEAD IMPLANT DT: 20061114
MDC IDC LEAD LOCATION: 753857
MDC IDC LEAD LOCATION: 753860
MDC IDC LEAD MODEL: 4457
MDC IDC LEAD MODEL: 4480
MDC IDC LEAD SERIAL: 470623
MDC IDC LEAD SERIAL: 520676
MDC IDC MSMT LEADCHNL RA IMPEDANCE VALUE: 500 Ohm
MDC IDC MSMT LEADCHNL RV IMPEDANCE VALUE: 380 Ohm
MDC IDC SET LEADCHNL RV PACING AMPLITUDE: 2.4 V
MDC IDC STAT BRADY RA PERCENT PACED: 26 %
Pulse Gen Model: 1290
Pulse Gen Serial Number: 764926

## 2015-08-17 NOTE — Patient Instructions (Signed)
Medication Instructions:  Your physician recommends that you continue on your current medications as directed. Please refer to the Current Medication list given to you today.   Labwork: At hospital   Testing/Procedures: Pacemaker generator change on 09/01/15  Please check in at [facility] at 12:30 Nothing to eat or drink after midnight    Follow-Up: Your physician recommends that you schedule a follow-up appointment in 10-14 days from 09/01/15 in device clinic and 3 months from 09/01/15 with Dr Rayann Heman   Any Other Special Instructions Will Be Listed Below (If Applicable).     If you need a refill on your cardiac medications before your next appointment, please call your pharmacy.

## 2015-08-17 NOTE — Progress Notes (Signed)
Electrophysiology Office Note   Date:  08/17/2015   ID:  Kyden Elwell, DOB [birth year], MRN [id number]  PCP:  Blanchie Serve, MD  Primary Electrophysiologist: Thompson Grayer, MD       History of Present Illness: Yvonne Buck is a 82 y.o. female who presents today for electrophysiology evaluation.   She presents today to the device clinic and her pacemaker is found to be at [facility] battery status. She is quite confused with her dementia but seems to be at baseline.  Unable to provide additional information or ROS.  Past Medical History  Diagnosis Date  . Tachycardia-bradycardia syndrome [facility])     s/p Chemical engineer PPM implant in [place]  . Paroxysmal atrial fibrillation (HCC)     chads2vasc score is at least 6.  She is not a candidate for anticoagulation due to falls.  Her AF burden is low.  . Hypertension   . Frequent falls   . Asthma   . Hyperlipidemia   . Depression   . Anxiety   . Pacemaker   . Stroke ([place])   . Osteoporosis 06/23/2013  . Vitamin D deficiency   . Sick sinus syndrome ([place])   . Hypothyroidism   . Chronic kidney disease   . Fracture of greater trochanter of left femur ([place]) 04/02/2013  . Hip fracture, left ([place]) 04/02/2013  . Pelvic fracture ([place]) 06/23/2013  . Senile osteoporosis 02/26/2014  . Rhinitis, allergic 02/26/2014  . TMJ (dislocation of temporomandibular joint) 06/16/2014   Past Surgical History  Procedure Laterality Date  . Pacemaker insertion  01/04/2005    [place] PPM 1290 [phone]), GDT [phone] atrial lead and 4457 V lead all implanted in [place]  . Cholecystectomy    . Gallbladder surgery    . Tonsillectomy and adenoidectomy       Current Outpatient Prescriptions  Medication Sig Dispense Refill  . acetaminophen (TYLENOL) 325 MG tablet Take 650 mg by mouth every 4 (four) hours as needed for moderate pain.    Marland Kitchen amLODipine (NORVASC) 5 MG tablet Take 5 mg by mouth daily. Take one tablet by mouth once daily for hypertension    .  aspirin EC 81 MG tablet Take 81 mg by mouth at bedtime. Take one tablet by mouth once daily for AFIB    . cholecalciferol (VITAMIN D) 1000 UNITS tablet Take 1,000 Units by mouth at bedtime. Take one tablet by mouth once daily for vitamin D deficiency    . Fluticasone-Salmeterol (ADVAIR) 250-50 MCG/DOSE AEPB Inhale 1 puff into the lungs 2 (two) times daily. Inhale one puff into the lungs twice daily for SOB/Wheeze. Rinse mouth after use.    . isosorbide mononitrate (IMDUR) 30 MG 24 hr tablet Take one tablet by mouth every morning for hypertension    . losartan (COZAAR) 100 MG tablet Take 100 mg by mouth daily. Take one tablet by mouth once daily for hypertension    . memantine (NAMENDA XR) 28 MG CP24 24 hr capsule Take 28 mg by mouth daily.    . metoprolol (LOPRESSOR) 50 MG tablet Take 50 mg by mouth 2 (two) times daily. Take one tablet by mouth twice daily for hypertension    . mirtazapine (REMERON) 15 MG tablet Take 15 mg by mouth daily.    . montelukast (SINGULAIR) 10 MG tablet Take 10 mg by mouth at bedtime. Take one tablet by mouth every night at bedtime for allergies    . OXYGEN Inhale 2 mLs into the lungs as needed (to  keep sats above 90%).     . potassium chloride SA (K-DUR,KLOR-CON) 20 MEQ tablet Take 20 mEq by mouth at bedtime. Take one tablet by mouth once daily with food for hypokalemia    . simvastatin (ZOCOR) 10 MG tablet Take 10 mg by mouth at bedtime. Reported on 06/15/2015    . simvastatin (ZOCOR) 20 MG tablet Take 30 mg by mouth daily at 6 PM. Reported on 05/23/2015    . UNABLE TO FIND Med Name: Med pass 120 mL 2 times daily     No current facility-administered medications for this visit.    Allergies:   Penicillins   Social History:  The patient  reports that she has never smoked. She has never used smokeless tobacco. She reports that she does not drink alcohol or use illicit drugs.   Family History:  The patient's family history includes Diabetes in her mother; Heart Problems in  her father; High blood pressure in her mother.    PHYSICAL EXAM: VS:  There were no vitals taken for this visit. , BMI There is no weight on file to calculate BMI. GEN: chronically ill appearing today, in no acute distress HEENT: decreased hearing Neck: no JVD, carotid bruits, or masses Cardiac: RRR; no murmurs, rubs, or gallops,no edema  Respiratory:  clear to auscultation bilaterally, normal work of breathing GI: soft, nontender, nondistended, + BS MS: in a rolling chair today Skin: warm and dry, device pocket is well healed Neuro:  Strength and sensation are intact Psych: euthymic mood, full affect  Device interrogation is reviewed today in detail.  See PaceArt for details.   Recent Labs: 05/05/2015: TSH 0.57 06/02/2015: ALT 13; BUN 12; Creatinine 0.6; Hemoglobin 13.5; Platelets 302; Potassium 4.3; Sodium 142    Lipid Panel     Component Value Date/Time   CHOL 165 05/05/2015   TRIG 71 05/05/2015   HDL 49 05/05/2015   LDLCALC 102 05/05/2015   LDLDIRECT 83 12/10/2010 1018     Wt Readings from Last 3 Encounters:  07/27/15 108 lb (48.988 kg)  06/22/15 109 lb 3.2 oz (49.533 kg)  06/01/15 109 lb 3.2 oz (49.533 kg)    ASSESSMENT AND PLAN:  1.  Sick sinus syndrome Pacemaker at [facility] Will schedule generator change I have instructed caregiver with the patient today to have POA (daughter) arrive with patient on day of generator change for further discussions.  2. afib Well controlled  chads2vasc score is at least 6.  She is not a candidate for anticoagulation due to falls.  Her AF burden is low.  3. HTN Stable No change required today   Signed, Thompson Grayer, MD  08/17/2015 2:36 PM     [address] [place] [address] [phone] (office) [phone] (fax)

## 2015-08-31 ENCOUNTER — Non-Acute Institutional Stay (SKILLED_NURSING_FACILITY): Payer: Medicare Other | Admitting: Internal Medicine

## 2015-08-31 ENCOUNTER — Encounter: Payer: Self-pay | Admitting: Internal Medicine

## 2015-08-31 DIAGNOSIS — I10 Essential (primary) hypertension: Secondary | ICD-10-CM | POA: Diagnosis not present

## 2015-08-31 DIAGNOSIS — F0390 Unspecified dementia without behavioral disturbance: Secondary | ICD-10-CM | POA: Diagnosis not present

## 2015-08-31 DIAGNOSIS — E43 Unspecified severe protein-calorie malnutrition: Secondary | ICD-10-CM

## 2015-08-31 DIAGNOSIS — F0393 Unspecified dementia, unspecified severity, with mood disturbance: Secondary | ICD-10-CM

## 2015-08-31 DIAGNOSIS — E870 Hyperosmolality and hypernatremia: Secondary | ICD-10-CM

## 2015-08-31 MED ORDER — VANCOMYCIN HCL IN DEXTROSE 1-5 GM/200ML-% IV SOLN
1000.0000 mg | INTRAVENOUS | Status: DC
  Filled 2015-08-31: qty 200

## 2015-08-31 MED ORDER — CHLORHEXIDINE GLUCONATE 4 % EX LIQD
60.0000 mL | Freq: Once | CUTANEOUS | Status: DC
  Filled 2015-08-31: qty 60

## 2015-08-31 MED ORDER — SODIUM CHLORIDE 0.9 % IV SOLN: INTRAVENOUS | Status: DC

## 2015-08-31 MED ORDER — SODIUM CHLORIDE 0.9 % IR SOLN
80.0000 mg | Status: DC
  Filled 2015-08-31: qty 2

## 2015-08-31 NOTE — Progress Notes (Signed)
Patient ID: Yvonne Buck, female   DOB: 26-May-1929, 82 y.o.   MRN: [id number]      [facility]  Code Status: DNR  Chief Complaint  Patient presents with  . Medical Management of Chronic Issues    Routine Visit    Allergies  Allergen Reactions  . Penicillins Other (See Comments)    Red spots, ### Tolerated Rocephin 12/2012 ###, Has patient had a PCN reaction causing immediate rash, facial/tongue/throat swelling, SOB or lightheadedness with hypotension: Unknown Has patient had a PCN reaction causing severe rash involving mucus membranes or skin necrosis: Unknown Has patient had a PCN reaction that required hospitalization Unknown Has patient had a PCN reaction occurring within the last 10 years: Unknown If all of the above answers are "NO", then may proceed with Cephalosporin u   Advanced Directives 05/23/2015  Does patient have an advance directive? Yes  Type of Advance Directive Out of facility DNR (pink MOST or yellow form)  Does patient want to make changes to advanced directive? -  Copy of advanced directive(s) in chart? Yes  Pre-existing out of facility DNR order (yellow form or pink MOST form) Yellow form placed in chart (order not valid for inpatient use)     HPI:   82 y/o female patient is seen today for routine visit. She has been at her baseline. No new concern from nursing staff. She is pleasantly confused and participates minimally in HPI and ROS.   Review of Systems:  Constitutional: Negative for fever   Respiratory: Negative for cough, shortness of breath and wheezing.    Cardiovascular: Negative for chest pain, palpitations Skin: Negative for itching and rash.   GI: negative for abdominal pain, nausea or vomiting. Musculoskeletal: no fall reported Skin: no rash or pressure ulcer   Past Medical History  Diagnosis Date  . Tachycardia-bradycardia syndrome [employer] [facility])     s/p Chemical engineer PPM implant in [place]  . Paroxysmal atrial fibrillation  (HCC)     chads2vasc score is at least 6.  She is not a candidate for anticoagulation due to falls.  Her AF burden is low.  . Hypertension   . Frequent falls   . Asthma   . Hyperlipidemia   . Depression   . Anxiety   . Pacemaker   . Stroke ([place])   . Osteoporosis 06/23/2013  . Vitamin D deficiency   . Sick sinus syndrome ([place])   . Hypothyroidism   . Chronic kidney disease   . Fracture of greater trochanter of left femur ([place]) 04/02/2013  . Hip fracture, left ([place]) 04/02/2013  . Pelvic fracture ([place]) 06/23/2013  . Senile osteoporosis 02/26/2014  . Rhinitis, allergic 02/26/2014  . TMJ (dislocation of temporomandibular joint) 06/16/2014     Medication reviewed. See [facility]   Medication List       This list is accurate as of: 08/31/15  3:47 PM.  Always use your most recent med list.               acetaminophen 325 MG tablet  Commonly known as:  TYLENOL  Take 650 mg by mouth every 4 (four) hours as needed for moderate pain.     amLODipine 5 MG tablet  Commonly known as:  NORVASC  Take 5 mg by mouth daily. Take one tablet by mouth once daily for hypertension     aspirin EC 81 MG tablet  Take 81 mg by mouth at bedtime. Take one tablet by mouth once daily for AFIB  cholecalciferol 1000 units tablet  Commonly known as:  VITAMIN D  Take 1,000 Units by mouth at bedtime. Take one tablet by mouth once daily for vitamin D deficiency     Fluticasone-Salmeterol 250-50 MCG/DOSE Aepb  Commonly known as:  ADVAIR  Inhale 1 puff into the lungs 2 (two) times daily. Inhale one puff into the lungs twice daily for SOB/Wheeze. Rinse mouth after use.     isosorbide mononitrate 30 MG 24 hr tablet  Commonly known as:  IMDUR  Take one tablet by mouth every morning for hypertension     losartan 100 MG tablet  Commonly known as:  COZAAR  Take 100 mg by mouth daily. Take one tablet by mouth once daily for hypertension     metoprolol 50 MG tablet  Commonly known as:  LOPRESSOR  Take 50 mg by mouth 2  (two) times daily. Take one tablet by mouth twice daily for hypertension     mirtazapine 15 MG tablet  Commonly known as:  REMERON  Take 15 mg by mouth daily.     montelukast 10 MG tablet  Commonly known as:  SINGULAIR  Take 10 mg by mouth at bedtime. Take one tablet by mouth every night at bedtime for allergies     NAMENDA XR 28 MG Cp24 24 hr capsule  Generic drug:  memantine  Take 28 mg by mouth daily.     OXYGEN  Inhale 2 mLs into the lungs as needed (to keep sats above 90%).     potassium chloride 10 MEQ tablet  Commonly known as:  K-DUR,KLOR-CON  Take 10 mEq by mouth daily.     simvastatin 20 MG tablet  Commonly known as:  ZOCOR  Take 30 mg by mouth daily at 6 PM. Reported on 05/23/2015     simvastatin 10 MG tablet  Commonly known as:  ZOCOR  Take 10 mg by mouth at bedtime. Reported on 06/15/2015     UNABLE TO FIND  Med Name: Med pass 120 mL 2 times daily        Physical exam BP 110/64 mmHg  Pulse 68  Temp(Src) 97.4 F (36.3 C) (Oral)  Resp 18  Ht 5\' 6"  (1.676 m)  Wt 109 lb 9.6 oz (49.714 kg)  BMI 17.70 kg/m2  Wt Readings from Last 3 Encounters:  08/31/15 109 lb 9.6 oz (49.714 kg)  07/27/15 108 lb (48.988 kg)  06/22/15 109 lb 3.2 oz (49.533 kg)   Body mass index is 17.7 kg/(m^2).  Constitutional: elderly, frail and thin built female HEENT: MMM, no cervical lymphadenopathy Eyes: PERRLA, EOMI Cardiovascular: Normal rate, regular rhythm and intact distal pulses.    Respiratory: CTAB  Musculoskeletal: able to move all 4 extremities with limited ROM, on geri chair, muscle wasting present, left leg brace present Neurological: She is alert and oriented to person only Psychiatric: poor insight and judgement Skin: senile purpura to both arms   Labs CBC Latest Ref Rng 07/30/2015 06/02/2015 05/23/2015  WBC - 6.5 6.7 8.7  Hemoglobin 12.0 - 16.0 g/dL 14.6 13.5 15.1(H)  Hematocrit 36 - 46 % 48(A) 44 46.7(H)  Platelets 150 - 399 K/L 267 302 322   CMP Latest Ref  Rng 07/30/2015 06/02/2015 05/23/2015  Glucose 65 - 99 mg/dL - - 116(H)  BUN 4 - 21 mg/dL 25(A) 12 20  Creatinine 0.5 - 1.1 mg/dL 0.6 0.6 0.75  Sodium 137 - 147 mmol/L 148(A) 142 140  Potassium 3.4 - 5.3 mmol/L 4.0 4.3 4.8  Chloride 101 -  111 mmol/L - - 103  CO2 22 - 32 mmol/L - - 30  Calcium 8.9 - 10.3 mg/dL - - 9.4  Total Protein 6.5 - 8.1 g/dL - - 6.5  Total Bilirubin 0.3 - 1.2 mg/dL - - 1.0  Alkaline Phos 25 - 125 U/L - 75 77  AST 13 - 35 U/L - 14 21  ALT 7 - 35 U/L - 13 29   Lipid Panel     Component Value Date/Time   CHOL 165 05/05/2015   TRIG 71 05/05/2015   HDL 49 05/05/2015   LDLCALC 102 05/05/2015   LDLDIRECT 83 12/10/2010 1018    Assessment/plan  Hypernatremia Hydration to be encourage and check bmp  HTN Stable. Monitor BP. Currently on losartan 100 mg daily, metoprolol 50 mg bid and imdur 30 mg daily.  Protein calorie malnutrition Persists, encourage po intake. Continue fortified food and medpass supplement. Continue remeron for her appetite. Monitor weight  Dementia with depression Stable, decline anticipated. Continue namenda and remeron. Continue supportive care.    Blanchie Serve, MD Internal Medicine [facility] [address] [place], [place] [address] Cell Phone (Monday-Friday 8 am - 5 pm): [phone] On Call: [phone] and follow prompts after 5 pm and on weekends Office Phone: [phone] Office Fax: [phone]

## 2015-09-01 LAB — BASIC METABOLIC PANEL
BUN: 18 mg/dL (ref 4–21)
Creatinine: 0.5 mg/dL (ref 0.5–1.1)
Glucose: 116 mg/dL
Potassium: 3.6 mmol/L (ref 3.4–5.3)
SODIUM: 148 mmol/L — AB (ref 137–147)

## 2015-09-02 ENCOUNTER — Telehealth: Payer: Self-pay | Admitting: Internal Medicine

## 2015-09-02 ENCOUNTER — Telehealth: Payer: Self-pay | Admitting: Nurse Practitioner

## 2015-09-02 NOTE — Telephone Encounter (Signed)
New message     The scheduler has a question regarding the pt

## 2015-09-02 NOTE — Telephone Encounter (Addendum)
Tried several times....says due to network difficulty call can not be made at this time and it disconnects

## 2015-09-02 NOTE — Telephone Encounter (Signed)
Spoke with scheduler at [facility] about pacemaker generator change scheduled for tomorrow with Dr Rayann Heman. Pt to arrive at 11:30AM. NPO after midnight. Her daughter is bedbound and unable to come with her.  I have called daughter and explained risks, benefits to pacemaker generator change as well as risks if lead revision is needed. She is aware and agrees with plan.  She knows that a Therapist, sports from short stay will call her tomorrow to get verbal consent signed.  I have advised her that Dr Rayann Heman will call when procedure complete tomorrow.  Chanetta Marshall, NP 09/02/2015 3:41 PM

## 2015-09-03 ENCOUNTER — Encounter (HOSPITAL_COMMUNITY): Payer: Self-pay | Admitting: Emergency Medicine

## 2015-09-03 ENCOUNTER — Ambulatory Visit (HOSPITAL_COMMUNITY)
Admission: RE | Admit: 2015-09-03 | Discharge: 2015-09-03 | Disposition: A | Discharge status: Skilled Nursing Facility | Payer: Medicare Other | Source: Ambulatory Visit | Attending: Internal Medicine | Admitting: Internal Medicine

## 2015-09-03 ENCOUNTER — Emergency Department (HOSPITAL_COMMUNITY)
Admission: EM | Admit: 2015-09-03 | Discharge: 2015-09-03 | Disposition: A | Discharge status: Home / Self Care | Payer: Medicare Other | Attending: Emergency Medicine | Admitting: Emergency Medicine

## 2015-09-03 ENCOUNTER — Encounter (HOSPITAL_COMMUNITY)
Admission: RE | Disposition: A | Discharge status: Skilled Nursing Facility | Payer: Self-pay | Source: Ambulatory Visit | Attending: Internal Medicine

## 2015-09-03 DIAGNOSIS — Z7982 Long term (current) use of aspirin: Secondary | ICD-10-CM | POA: Insufficient documentation

## 2015-09-03 DIAGNOSIS — J45909 Unspecified asthma, uncomplicated: Secondary | ICD-10-CM | POA: Diagnosis not present

## 2015-09-03 DIAGNOSIS — I129 Hypertensive chronic kidney disease with stage 1 through stage 4 chronic kidney disease, or unspecified chronic kidney disease: Secondary | ICD-10-CM | POA: Insufficient documentation

## 2015-09-03 DIAGNOSIS — R296 Repeated falls: Secondary | ICD-10-CM | POA: Insufficient documentation

## 2015-09-03 DIAGNOSIS — F0390 Unspecified dementia without behavioral disturbance: Secondary | ICD-10-CM | POA: Insufficient documentation

## 2015-09-03 DIAGNOSIS — Z45010 Encounter for checking and testing of cardiac pacemaker pulse generator [battery]: Secondary | ICD-10-CM | POA: Diagnosis not present

## 2015-09-03 DIAGNOSIS — I480 Paroxysmal atrial fibrillation: Secondary | ICD-10-CM | POA: Insufficient documentation

## 2015-09-03 DIAGNOSIS — N189 Chronic kidney disease, unspecified: Secondary | ICD-10-CM | POA: Insufficient documentation

## 2015-09-03 DIAGNOSIS — E559 Vitamin D deficiency, unspecified: Secondary | ICD-10-CM | POA: Diagnosis not present

## 2015-09-03 DIAGNOSIS — Z79899 Other long term (current) drug therapy: Secondary | ICD-10-CM | POA: Insufficient documentation

## 2015-09-03 DIAGNOSIS — I495 Sick sinus syndrome: Secondary | ICD-10-CM

## 2015-09-03 DIAGNOSIS — E785 Hyperlipidemia, unspecified: Secondary | ICD-10-CM | POA: Insufficient documentation

## 2015-09-03 DIAGNOSIS — I160 Hypertensive urgency: Secondary | ICD-10-CM | POA: Diagnosis not present

## 2015-09-03 DIAGNOSIS — Z8673 Personal history of transient ischemic attack (TIA), and cerebral infarction without residual deficits: Secondary | ICD-10-CM | POA: Insufficient documentation

## 2015-09-03 DIAGNOSIS — Z950 Presence of cardiac pacemaker: Secondary | ICD-10-CM | POA: Diagnosis not present

## 2015-09-03 HISTORY — PX: EP IMPLANTABLE DEVICE: SHX172B

## 2015-09-03 LAB — CBC WITH DIFFERENTIAL/PLATELET
BASOS ABS: 0 10*3/uL (ref 0.0–0.1)
BASOS PCT: 1 %
EOS ABS: 1.5 10*3/uL — AB (ref 0.0–0.7)
Eosinophils Relative: 18 %
HEMATOCRIT: 46.2 % — AB (ref 36.0–46.0)
HEMOGLOBIN: 14.6 g/dL (ref 12.0–15.0)
Lymphocytes Relative: 20 %
Lymphs Abs: 1.6 10*3/uL (ref 0.7–4.0)
MCH: 28.4 pg (ref 26.0–34.0)
MCHC: 31.6 g/dL (ref 30.0–36.0)
MCV: 89.9 fL (ref 78.0–100.0)
MONOS PCT: 7 %
Monocytes Absolute: 0.6 10*3/uL (ref 0.1–1.0)
NEUTROS ABS: 4.5 10*3/uL (ref 1.7–7.7)
NEUTROS PCT: 54 %
Platelets: 280 10*3/uL (ref 150–400)
RBC: 5.14 MIL/uL — ABNORMAL HIGH (ref 3.87–5.11)
RDW: 13.4 % (ref 11.5–15.5)
WBC: 8.1 10*3/uL (ref 4.0–10.5)

## 2015-09-03 LAB — CBC
HCT: 52.9 % — ABNORMAL HIGH (ref 36.0–46.0)
HEMOGLOBIN: 16.5 g/dL — AB (ref 12.0–15.0)
MCH: 28.5 pg (ref 26.0–34.0)
MCHC: 31.2 g/dL (ref 30.0–36.0)
MCV: 91.4 fL (ref 78.0–100.0)
Platelets: 292 10*3/uL (ref 150–400)
RBC: 5.79 MIL/uL — ABNORMAL HIGH (ref 3.87–5.11)
RDW: 13.7 % (ref 11.5–15.5)
WBC: 9.5 10*3/uL (ref 4.0–10.5)

## 2015-09-03 LAB — BASIC METABOLIC PANEL
ANION GAP: 9 (ref 5–15)
ANION GAP: 6 (ref 5–15)
BUN: 15 mg/dL (ref 6–20)
BUN: 11 mg/dL (ref 6–20)
CALCIUM: 9.6 mg/dL (ref 8.9–10.3)
CALCIUM: 8.8 mg/dL — AB (ref 8.9–10.3)
CO2: 30 mmol/L (ref 22–32)
CO2: 28 mmol/L (ref 22–32)
CREATININE: 0.51 mg/dL (ref 0.44–1.00)
Chloride: 102 mmol/L (ref 101–111)
Chloride: 102 mmol/L (ref 101–111)
Creatinine, Ser: 0.58 mg/dL (ref 0.44–1.00)
GFR calc Af Amer: >60 mL/min (ref >60)
GLUCOSE: 112 mg/dL — AB (ref 65–99)
Glucose, Bld: 97 mg/dL (ref 65–99)
POTASSIUM: 3.6 mmol/L (ref 3.5–5.1)
Potassium: 3.4 mmol/L — ABNORMAL LOW (ref 3.5–5.1)
SODIUM: 141 mmol/L (ref 135–145)
SODIUM: 136 mmol/L (ref 135–145)

## 2015-09-03 LAB — SURGICAL PCR SCREEN
MRSA, PCR: NEGATIVE
STAPHYLOCOCCUS AUREUS: NEGATIVE

## 2015-09-03 LAB — PROTIME-INR
INR: 0.93 (ref 0.00–1.49)
PROTHROMBIN TIME: 12.7 s (ref 11.6–15.2)

## 2015-09-03 SURGERY — PPM/BIV PPM GENERATOR CHANGEOUT

## 2015-09-03 MED ORDER — ACETAMINOPHEN 325 MG PO TABS: 325.0000 mg | ORAL_TABLET | ORAL | Status: DC | PRN

## 2015-09-03 MED ORDER — SODIUM CHLORIDE 0.9 % IR SOLN
Status: AC
  Filled 2015-09-03: qty 2

## 2015-09-03 MED ORDER — VANCOMYCIN HCL IN DEXTROSE 1-5 GM/200ML-% IV SOLN
1000.0000 mg | INTRAVENOUS | Status: AC
  Administered 2015-09-03: 1000 mg via INTRAVENOUS

## 2015-09-03 MED ORDER — METOPROLOL TARTRATE 12.5 MG HALF TABLET
ORAL_TABLET | ORAL | Status: AC
  Administered 2015-09-03: 50 mg via ORAL
  Filled 2015-09-03: qty 4

## 2015-09-03 MED ORDER — SODIUM CHLORIDE 0.9 % IV SOLN: 250.0000 mL | INTRAVENOUS | Status: DC | PRN

## 2015-09-03 MED ORDER — METOPROLOL TARTRATE 50 MG PO TABS
50.0000 mg | ORAL_TABLET | Freq: Once | ORAL | Status: AC
  Administered 2015-09-03: 50 mg via ORAL

## 2015-09-03 MED ORDER — SODIUM CHLORIDE 0.9% FLUSH: 3.0000 mL | INTRAVENOUS | Status: DC | PRN

## 2015-09-03 MED ORDER — LIDOCAINE HCL (PF) 1 % IJ SOLN
INTRAMUSCULAR | Status: DC | PRN
  Administered 2015-09-03: 31 mL via INTRADERMAL

## 2015-09-03 MED ORDER — VANCOMYCIN HCL IN DEXTROSE 1-5 GM/200ML-% IV SOLN
INTRAVENOUS | Status: AC
  Filled 2015-09-03: qty 200

## 2015-09-03 MED ORDER — LIDOCAINE HCL (PF) 1 % IJ SOLN
INTRAMUSCULAR | Status: AC
  Filled 2015-09-03: qty 60

## 2015-09-03 MED ORDER — SODIUM CHLORIDE 0.9 % IR SOLN
80.0000 mg | Status: AC
  Administered 2015-09-03: 80 mg

## 2015-09-03 MED ORDER — SODIUM CHLORIDE 0.9% FLUSH: 3.0000 mL | Freq: Two times a day (BID) | INTRAVENOUS | Status: DC

## 2015-09-03 MED ORDER — ONDANSETRON HCL 4 MG/2ML IJ SOLN: 4.0000 mg | Freq: Four times a day (QID) | INTRAMUSCULAR | Status: DC | PRN

## 2015-09-03 SURGICAL SUPPLY — 4 items
CABLE SURGICAL S-101-97-12 (CABLE) ×3 IMPLANT
PACEMAKER ASSURITY DR-RF (Pacemaker) ×3 IMPLANT
PAD DEFIB LIFELINK (PAD) ×3 IMPLANT
TRAY PACEMAKER INSERTION (PACKS) ×3 IMPLANT

## 2015-09-03 NOTE — Discharge Instructions (Signed)
Continue your medications as before. ° °Return to the emergency department if symptoms significantly worsen or change. °

## 2015-09-03 NOTE — Progress Notes (Deleted)
UNABLE TO ASSESS D/T DEMENTIA

## 2015-09-03 NOTE — Progress Notes (Addendum)
UNABLE TO ASSESS D/T DEMENTIA PSYCO/ SOCIAL/ SPRITUAL ASSESSMENTS

## 2015-09-03 NOTE — ED Provider Notes (Signed)
CSN: [id number]     Arrival date & time 09/03/15  1851 History   First MD Initiated Contact with Patient 09/03/15 1858     Chief Complaint  Patient presents with  . Hypertension     (Consider location/radiation/quality/duration/timing/severity/associated sxs/prior Treatment) HPI Comments: Patient is an 82 year old female with history of dementia, sick sinus syndrome with pacer. She was sent here from the EP study for evaluation of hypertension. She apparently had her battery changed in her pacemaker, however was too hypertensive postoperatively for her extended care facility to accept her back. She has little to history secondary to dementia.  Patient is a 82 y.o. female presenting with hypertension. The history is provided by the patient.  Hypertension This is a new problem. The current episode started 1 to 2 hours ago. The problem occurs constantly. The problem has not changed since onset.Pertinent negatives include no chest pain and no shortness of breath. Nothing aggravates the symptoms. Nothing relieves the symptoms. She has tried nothing for the symptoms.    Past Medical History  Diagnosis Date  . Tachycardia-bradycardia syndrome [facility])     s/p Chemical engineer PPM implant in [place]  . Paroxysmal atrial fibrillation (HCC)     chads2vasc score is at least 6.  She is not a candidate for anticoagulation due to falls.  Her AF burden is low.  . Hypertension   . Frequent falls   . Asthma   . Hyperlipidemia   . Depression   . Anxiety   . Pacemaker   . Stroke ([place])   . Osteoporosis 06/23/2013  . Vitamin D deficiency   . Sick sinus syndrome ([place])   . Hypothyroidism   . Chronic kidney disease   . Fracture of greater trochanter of left femur ([place]) 04/02/2013  . Hip fracture, left ([place]) 04/02/2013  . Pelvic fracture ([place]) 06/23/2013  . Senile osteoporosis 02/26/2014  . Rhinitis, allergic 02/26/2014  . TMJ (dislocation of temporomandibular joint) 06/16/2014   Past Surgical History  Procedure  Laterality Date  . Pacemaker insertion  01/04/2005    [place] PPM [id number] [phone]), GDT [phone] atrial lead and 4457 V lead all implanted in [place]  . Cholecystectomy    . Gallbladder surgery    . Tonsillectomy and adenoidectomy     Family History  Problem Relation Age of Onset  . Diabetes Mother   . High blood pressure Mother   . Heart Problems Father    Social History  Substance Use Topics  . Smoking status: Never Smoker   . Smokeless tobacco: Never Used  . Alcohol Use: No   OB History    No data available     Review of Systems  Respiratory: Negative for shortness of breath.   Cardiovascular: Negative for chest pain.  All other systems reviewed and are negative.     Allergies  Penicillins  Home Medications   Prior to Admission medications   Medication Sig Start Date End Date Taking? Authorizing Provider  acetaminophen (TYLENOL) 325 MG tablet Take 650 mg by mouth every 4 (four) hours as needed for moderate pain.    Historical Provider, MD  amLODipine (NORVASC) 5 MG tablet Take 5 mg by mouth daily.  08/21/14   Historical Provider, MD  aspirin EC 81 MG tablet Take 81 mg by mouth at bedtime. for AFIB    Historical Provider, MD  cholecalciferol (VITAMIN D) 1000 UNITS tablet Take 1,000 Units by mouth at bedtime. for vitamin D deficiency    Historical Provider, MD  Fluticasone-Salmeterol (ADVAIR) 250-50 MCG/DOSE AEPB Inhale 1 puff into the lungs 2 (two) times daily. for SOB/Wheeze. Rinse mouth after use.    Historical Provider, MD  isosorbide mononitrate (IMDUR) 30 MG 24 hr tablet Take 30 mg by mouth every morning. for hypertension    Historical Provider, MD  losartan (COZAAR) 100 MG tablet Take 100 mg by mouth daily.     Historical Provider, MD  memantine (NAMENDA XR) 28 MG CP24 24 hr capsule Take 28 mg by mouth daily.    Historical Provider, MD  metoprolol (LOPRESSOR) 50 MG tablet Take 50 mg by mouth 2 (two) times daily.     Historical Provider, MD  mirtazapine  (REMERON) 15 MG tablet Take 15 mg by mouth daily.    Historical Provider, MD  montelukast (SINGULAIR) 10 MG tablet Take 10 mg by mouth at bedtime. for allergies    Historical Provider, MD  OXYGEN Inhale 2 mLs into the lungs as needed (to keep sats above 90%).     Historical Provider, MD  potassium chloride (K-DUR,KLOR-CON) 10 MEQ tablet Take 10 mEq by mouth daily.    Historical Provider, MD  simvastatin (ZOCOR) 10 MG tablet Take 10 mg by mouth at bedtime. Take 10 mg with 20 mg tablet to equal 30 mg at bedtime 04/25/15   Historical Provider, MD  simvastatin (ZOCOR) 20 MG tablet Take 30 mg by mouth daily at 6 PM. Take 20 mg with 10 mg tablet to equal 30 mg at bedtime    Historical Provider, MD  [place] Name: Med pass 120 mL 2 times daily    Historical Provider, MD   BP 172/89 mmHg  Pulse 105  Resp 30  SpO2 95% Physical Exam  Constitutional: She appears well-developed and well-nourished. No distress.  HENT:  Head: Normocephalic and atraumatic.  Neck: Normal range of motion. Neck supple.  Cardiovascular: Normal rate and regular rhythm.  Exam reveals no gallop and no friction rub.   No murmur heard. Pulmonary/Chest: Effort normal and breath sounds normal. No respiratory distress. She has no wheezes.  Abdominal: Soft. Bowel sounds are normal. She exhibits no distension. There is no tenderness.  Musculoskeletal: Normal range of motion.  Neurological: She is alert.  Neurologic exam is difficult secondary to baseline medical condition, however she appears to move all extremities with purpose and there are no obvious deficits.  Skin: Skin is warm and dry. She is not diaphoretic.  Nursing note and vitals reviewed.   ED Course  Procedures (including critical care time) Labs Review Labs Reviewed  BASIC METABOLIC PANEL - Abnormal; Notable for the following:    Potassium 3.4 (*)    Calcium 8.8 (*)    All other components within normal limits  CBC WITH DIFFERENTIAL/PLATELET - Abnormal;  Notable for the following:    RBC 5.14 (*)    HCT 46.2 (*)    Eosinophils Absolute 1.5 (*)    All other components within normal limits    Imaging Review No results found. I have personally reviewed and evaluated these images and lab results as part of my medical decision-making.   EKG Interpretation   Date/Time:  Thursday September 03 2015 19:40:58 EDT Ventricular Rate:  61 PR Interval:    QRS Duration: 93 QT Interval:  457 QTC Calculation: 461 R Axis:   84 Text Interpretation:  Sinus rhythm Ventricular premature complex  Borderline right axis deviation Baseline wander in lead(s) V6 Confirmed by  Julyana Woolverton  MD, Cinsere Mizrahi ([id number]) on 09/03/2015 7:44:41 PM  MDM   Final diagnoses:  None    Her EKG is unchanged and laboratory studies are unremarkable. Her blood pressure was initially elevated, however improved without any intervention other than observation. I see no indication for admission and feel as though she is appropriate for discharge. Her most recent blood pressure is 172/89. I suspect this will come down with time and do not feel as though acutely lowering her blood pressure now is in her best interest.    Veryl Speak, MD 09/03/15 2054

## 2015-09-03 NOTE — Progress Notes (Signed)
Pt received from cath lab via stretcher. BP 208/110 HR 72. 100% O2 sat on RA. EMS notified to transport to [facility]. [place] request that daily dose of Metoprolol be given prior to transport. Dr. Rayann Heman notified. Order received. Metoprolol given. Discharge instruction faxed to facility.

## 2015-09-03 NOTE — Progress Notes (Signed)
Patient arrived to [facility] pre procedure with bilateral wrist restraints on.

## 2015-09-03 NOTE — ED Notes (Signed)
Pt discharged back to [facility] with PTAR. NAD noted at time of discharge.

## 2015-09-03 NOTE — H&P (View-Only) (Signed)
Electrophysiology Office Note   Date:  08/17/2015   ID:  Yvonne Buck, DOB 1929-12-20, MRN [id number]  PCP:  Blanchie Serve, MD  Primary Electrophysiologist: Thompson Grayer, MD       History of Present Illness: Yvonne Buck is a 82 y.o. female who presents today for electrophysiology evaluation.   She presents today to the device clinic and her pacemaker is found to be at [facility] battery status. She is quite confused with her dementia but seems to be at baseline.  Unable to provide additional information or ROS.  Past Medical History  Diagnosis Date  . Tachycardia-bradycardia syndrome [facility])     s/p Chemical engineer PPM implant in [place]  . Paroxysmal atrial fibrillation (HCC)     chads2vasc score is at least 6.  She is not a candidate for anticoagulation due to falls.  Her AF burden is low.  . Hypertension   . Frequent falls   . Asthma   . Hyperlipidemia   . Depression   . Anxiety   . Pacemaker   . Stroke ([place])   . Osteoporosis 06/23/2013  . Vitamin D deficiency   . Sick sinus syndrome ([place])   . Hypothyroidism   . Chronic kidney disease   . Fracture of greater trochanter of left femur ([place]) 04/02/2013  . Hip fracture, left ([place]) 04/02/2013  . Pelvic fracture ([place]) 06/23/2013  . Senile osteoporosis 02/26/2014  . Rhinitis, allergic 02/26/2014  . TMJ (dislocation of temporomandibular joint) 06/16/2014   Past Surgical History  Procedure Laterality Date  . Pacemaker insertion  01/04/2005    [place] PPM 1290 [phone]), GDT [phone] atrial lead and 4457 V lead all implanted in [place]  . Cholecystectomy    . Gallbladder surgery    . Tonsillectomy and adenoidectomy       Current Outpatient Prescriptions  Medication Sig Dispense Refill  . acetaminophen (TYLENOL) 325 MG tablet Take 650 mg by mouth every 4 (four) hours as needed for moderate pain.    [place] amLODipine (NORVASC) 5 MG tablet Take 5 mg by mouth daily. Take one tablet by mouth once daily for hypertension    .  aspirin EC 81 MG tablet Take 81 mg by mouth at bedtime. Take one tablet by mouth once daily for AFIB    . cholecalciferol (VITAMIN D) 1000 UNITS tablet Take 1,000 Units by mouth at bedtime. Take one tablet by mouth once daily for vitamin D deficiency    . Fluticasone-Salmeterol (ADVAIR) 250-50 MCG/DOSE AEPB Inhale 1 puff into the lungs 2 (two) times daily. Inhale one puff into the lungs twice daily for SOB/Wheeze. Rinse mouth after use.    . isosorbide mononitrate (IMDUR) 30 MG 24 hr tablet Take one tablet by mouth every morning for hypertension    . losartan (COZAAR) 100 MG tablet Take 100 mg by mouth daily. Take one tablet by mouth once daily for hypertension    . memantine (NAMENDA XR) 28 MG CP24 24 hr capsule Take 28 mg by mouth daily.    . metoprolol (LOPRESSOR) 50 MG tablet Take 50 mg by mouth 2 (two) times daily. Take one tablet by mouth twice daily for hypertension    . mirtazapine (REMERON) 15 MG tablet Take 15 mg by mouth daily.    . montelukast (SINGULAIR) 10 MG tablet Take 10 mg by mouth at bedtime. Take one tablet by mouth every night at bedtime for allergies    . OXYGEN Inhale 2 mLs into the lungs as needed (to  keep sats above 90%).     . potassium chloride SA (K-DUR,KLOR-CON) 20 MEQ tablet Take 20 mEq by mouth at bedtime. Take one tablet by mouth once daily with food for hypokalemia    . simvastatin (ZOCOR) 10 MG tablet Take 10 mg by mouth at bedtime. Reported on 06/15/2015    . simvastatin (ZOCOR) 20 MG tablet Take 30 mg by mouth daily at 6 PM. Reported on 05/23/2015    . UNABLE TO FIND Med Name: Med pass 120 mL 2 times daily     No current facility-administered medications for this visit.    Allergies:   Penicillins   Social History:  The patient  reports that she has never smoked. She has never used smokeless tobacco. She reports that she does not drink alcohol or use illicit drugs.   Family History:  The patient's family history includes Diabetes in her mother; Heart Problems in  her father; High blood pressure in her mother.    PHYSICAL EXAM: VS:  There were no vitals taken for this visit. , BMI There is no weight on file to calculate BMI. GEN: chronically ill appearing today, in no acute distress HEENT: decreased hearing Neck: no JVD, carotid bruits, or masses Cardiac: RRR; no murmurs, rubs, or gallops,no edema  Respiratory:  clear to auscultation bilaterally, normal work of breathing GI: soft, nontender, nondistended, + BS MS: in a rolling chair today Skin: warm and dry, device pocket is well healed Neuro:  Strength and sensation are intact Psych: euthymic mood, full affect  Device interrogation is reviewed today in detail.  See PaceArt for details.   Recent Labs: 05/05/2015: TSH 0.57 06/02/2015: ALT 13; BUN 12; Creatinine 0.6; Hemoglobin 13.5; Platelets 302; Potassium 4.3; Sodium 142    Lipid Panel     Component Value Date/Time   CHOL 165 05/05/2015   TRIG 71 05/05/2015   HDL 49 05/05/2015   LDLCALC 102 05/05/2015   LDLDIRECT 83 12/10/2010 1018     Wt Readings from Last 3 Encounters:  07/27/15 108 lb (48.988 kg)  06/22/15 109 lb 3.2 oz (49.533 kg)  06/01/15 109 lb 3.2 oz (49.533 kg)    ASSESSMENT AND PLAN:  1.  Sick sinus syndrome Pacemaker at [facility] Will schedule generator change I have instructed caregiver with the patient today to have POA (daughter) arrive with patient on day of generator change for further discussions.  2. afib Well controlled  chads2vasc score is at least 6.  She is not a candidate for anticoagulation due to falls.  Her AF burden is low.  3. HTN Stable No change required today   Signed, Thompson Grayer, MD  08/17/2015 2:36 PM     [place] [address] [phone] (office) [phone] (fax)

## 2015-09-03 NOTE — Interval H&P Note (Signed)
History and Physical Interval Note:  09/03/2015 1:03 PM  Yvonne Buck  has presented today for surgery, with the diagnosis of eol  The various methods of treatment have been discussed with the patients daughter by phone. After consideration of risks, benefits and other options for treatment, the patients daughter has consented to  Procedure(s):  PPM Generator Changeout (N/A) as a surgical intervention .  The patient's history has been reviewed, patient examined, no change in status, stable for surgery.  I have reviewed the patient's chart and labs.  Questions were answered to the patient's satisfaction.     Thompson Grayer

## 2015-09-03 NOTE — ED Notes (Signed)
Patient came from [facility] to have generator change in pacemaker. Patient's BP was and nurse gave report to go back to facility. facillity refused to take patient due to high BP. Patient brought to ED by EMS. Hx dementia. Patient disoriented x 4 at baseline. 215/98 HR 65 resp 18.

## 2015-09-03 NOTE — Discharge Instructions (Signed)

## 2015-09-04 ENCOUNTER — Encounter (HOSPITAL_COMMUNITY): Payer: Self-pay | Admitting: Internal Medicine

## 2015-09-14 ENCOUNTER — Ambulatory Visit (INDEPENDENT_AMBULATORY_CARE_PROVIDER_SITE_OTHER): Payer: Medicare Other | Admitting: *Deleted

## 2015-09-14 DIAGNOSIS — I495 Sick sinus syndrome: Secondary | ICD-10-CM

## 2015-09-14 LAB — CUP PACEART INCLINIC DEVICE CHECK
Battery Voltage: 3.1 V
Brady Statistic RA Percent Paced: 40 %
Brady Statistic RV Percent Paced: 0.09 %
Date Time Interrogation Session: 20170724182847
Implantable Lead Implant Date: 20061114
Implantable Lead Location: 753860
Implantable Lead Model: 4457
Implantable Lead Model: 4480
Implantable Lead Serial Number: 470623
Implantable Lead Serial Number: 520676
Lead Channel Impedance Value: 387.5 Ohm
Lead Channel Impedance Value: 487.5 Ohm
Lead Channel Pacing Threshold Amplitude: 0.5 V
Lead Channel Sensing Intrinsic Amplitude: 4 mV
Lead Channel Setting Pacing Pulse Width: 0.4 ms
MDC IDC LEAD IMPLANT DT: 20061114
MDC IDC LEAD LOCATION: 753857
MDC IDC MSMT BATTERY REMAINING LONGEVITY: 140.4
MDC IDC MSMT LEADCHNL RA PACING THRESHOLD PULSEWIDTH: 0.4 ms
MDC IDC MSMT LEADCHNL RV PACING THRESHOLD AMPLITUDE: 0.5 V
MDC IDC MSMT LEADCHNL RV PACING THRESHOLD PULSEWIDTH: 0.4 ms
MDC IDC MSMT LEADCHNL RV SENSING INTR AMPL: 12 mV
MDC IDC SET LEADCHNL RA PACING AMPLITUDE: 2 V
MDC IDC SET LEADCHNL RV PACING AMPLITUDE: 2.5 V
MDC IDC SET LEADCHNL RV SENSING SENSITIVITY: 2 mV
Pulse Gen Serial Number: 7923892

## 2015-09-14 NOTE — Progress Notes (Signed)
Wound check appointment, s/p generator change on 09/03/15. Steri-strips removed. Wound without redness or edema. Incision edges approximated, wound well healed. Normal device function. Thresholds, sensing, and impedances consistent with implant measurements. Device programmed at chronic outputs. Histogram distribution appropriate for patient and level of activity. No mode switches or high ventricular rates noted. Patient educated about wound care and arm mobility (written instructions included in facility packet). Instructed SNF to plug in [facility] monitor via written instructions. ROV in 3 months with JA.

## 2015-09-18 ENCOUNTER — Encounter: Payer: Self-pay | Admitting: Internal Medicine

## 2015-09-28 ENCOUNTER — Non-Acute Institutional Stay (SKILLED_NURSING_FACILITY): Payer: Medicare Other | Admitting: Internal Medicine

## 2015-09-28 ENCOUNTER — Encounter: Payer: Self-pay | Admitting: Internal Medicine

## 2015-09-28 DIAGNOSIS — I495 Sick sinus syndrome: Secondary | ICD-10-CM

## 2015-09-28 DIAGNOSIS — F0391 Unspecified dementia with behavioral disturbance: Secondary | ICD-10-CM

## 2015-09-28 DIAGNOSIS — J4520 Mild intermittent asthma, uncomplicated: Secondary | ICD-10-CM | POA: Diagnosis not present

## 2015-09-28 DIAGNOSIS — F03918 Unspecified dementia, unspecified severity, with other behavioral disturbance: Secondary | ICD-10-CM

## 2015-09-28 DIAGNOSIS — I480 Paroxysmal atrial fibrillation: Secondary | ICD-10-CM | POA: Diagnosis not present

## 2015-09-28 NOTE — Progress Notes (Signed)
Patient ID: Yvonne Buck, female   DOB: 1929/06/22, 82 y.o.   MRN: [id number]      [facility]  Code Status: DNR  Chief Complaint  Patient presents with  . Medical Management of Chronic Issues    Routine Visit    Allergies  Allergen Reactions  . Penicillins Other (See Comments)    Red spots, ### Tolerated Rocephin 12/2012 ###, Has patient had a PCN reaction causing immediate rash, facial/tongue/throat swelling, SOB or lightheadedness with hypotension: Unknown Has patient had a PCN reaction causing severe rash involving mucus membranes or skin necrosis: Unknown Has patient had a PCN reaction that required hospitalization Unknown Has patient had a PCN reaction occurring within the last 10 years: Unknown If all of the above answers are "NO", then may proceed with Cephalosporin u   Advanced Directives 09/03/2015  Does patient have an advance directive? Yes  Type of Advance Directive [place]  Does patient want to make changes to advanced directive? No - Patient declined  Copy of advanced directive(s) in chart? No - copy requested  Would patient like information on creating an advanced directive? -  Pre-existing out of facility DNR order (yellow form or pink MOST form) -     HPI:   82 y/o female patient is seen today for routine visit. She underwent pacemaker replacement with cardiology and tolerated the procedure well. She has been started on aricept for her memory and is tolerating this well. She has been at her baseline. No new concern from nursing staff. She is pleasantly confused and participates minimally in HPI and ROS. On chart review, she has gained weight.   Review of Systems: from patient and nursing Constitutional: Negative for fever   Respiratory: Negative for cough, shortness of breath and wheezing.    Cardiovascular: Negative for chest pain Skin: Negative for itching, pressure ulcer and rash.   GI: negative for abdominal pain,  nausea or vomiting. Musculoskeletal: no fall reported   Past Medical History:  Diagnosis Date  . Anxiety   . Asthma   . Chronic kidney disease   . Depression   . Fracture of greater trochanter of left femur ([place]) 04/02/2013  . Frequent falls   . Hip fracture, left ([place]) 04/02/2013  . Hyperlipidemia   . Hypertension   . Hypothyroidism   . Osteoporosis 06/23/2013  . Pacemaker   . Paroxysmal atrial fibrillation (HCC)    chads2vasc score is at least 6.  She is not a candidate for anticoagulation due to falls.  Her AF burden is low.  . Pelvic fracture ([place]) 06/23/2013  . Rhinitis, allergic 02/26/2014  . Senile osteoporosis 02/26/2014  . Sick sinus syndrome ([place])   . Stroke ([place])   . Tachycardia-bradycardia syndrome [facility])    s/p [employer] PPM implant in [place]  . TMJ (dislocation of temporomandibular joint) 06/16/2014  . Vitamin D deficiency      Medication reviewed. See [facility]   Medication List       Accurate as of 09/28/15 12:56 PM. Always use your most recent med list.          acetaminophen 325 MG tablet Commonly known as:  TYLENOL Take 650 mg by mouth every 4 (four) hours as needed for moderate pain.   acetaminophen 500 MG tablet Commonly known as:  TYLENOL Take 1,000 mg by mouth 3 (three) times daily.   amLODipine 5 MG tablet Commonly known as:  NORVASC Take 5 mg by mouth daily.  aspirin EC 81 MG tablet Take 81 mg by mouth at bedtime. for AFIB   cholecalciferol 1000 units tablet Commonly known as:  VITAMIN D Take 1,000 Units by mouth at bedtime. for vitamin D deficiency   donepezil 5 MG tablet Commonly known as:  ARICEPT Take 5 mg by mouth at bedtime. Stop date 10/17/15   donepezil 10 MG tablet Commonly known as:  ARICEPT Take 10 mg by mouth at bedtime.   Fluticasone-Salmeterol 250-50 MCG/DOSE Aepb Commonly known as:  ADVAIR Inhale 1 puff into the lungs 2 (two) times daily. for SOB/Wheeze. Rinse mouth after use.   isosorbide mononitrate 30 MG 24 hr  tablet Commonly known as:  IMDUR Take 30 mg by mouth every morning. for hypertension   losartan 100 MG tablet Commonly known as:  COZAAR Take 100 mg by mouth daily.   metoprolol 50 MG tablet Commonly known as:  LOPRESSOR Take 50 mg by mouth 2 (two) times daily.   mirtazapine 15 MG tablet Commonly known as:  REMERON Take 15 mg by mouth daily.   montelukast 10 MG tablet Commonly known as:  SINGULAIR Take 10 mg by mouth at bedtime. for allergies   NAMENDA XR 28 MG Cp24 24 hr capsule Generic drug:  memantine Take 28 mg by mouth daily.   OXYGEN Inhale 2 mLs into the lungs as needed (to keep sats above 90%).   potassium chloride 10 MEQ tablet Commonly known as:  K-DUR,KLOR-CON Take 10 mEq by mouth daily.   simvastatin 20 MG tablet Commonly known as:  ZOCOR Take 30 mg by mouth daily at 6 PM. Take 20 mg with 10 mg tablet to equal 30 mg at bedtime   simvastatin 10 MG tablet Commonly known as:  ZOCOR Take 10 mg by mouth at bedtime. Take 10 mg with 20 mg tablet to equal 30 mg at bedtime   UNABLE TO FIND Med Name: Med pass 120 mL 2 times daily       Physical exam BP 132/70   Pulse 74   Ht 5\' 6"  (1.676 m)   Wt 115 lb 3.2 oz (52.3 kg)   SpO2 96%   BMI 18.59 kg/m   Wt Readings from Last 3 Encounters:  09/28/15 115 lb 3.2 oz (52.3 kg)  08/31/15 109 lb 9.6 oz (49.7 kg)  07/27/15 108 lb (49 kg)   Body mass index is 18.59 kg/m.  Constitutional: elderly, frail and thin built female HEENT: MMM, no cervical lymphadenopathy Eyes: PERRLA, EOMI Cardiovascular: Normal rate, regular rhythm and intact distal pulses.    Respiratory: CTAB  Musculoskeletal: able to move all 4 extremities with limited ROM, on geri chair, muscle wasting present, left leg brace present Neurological: She is alert and oriented to person only Psychiatric: poor insight and judgement Skin: senile purpura to both arms   Labs CBC Latest Ref Rng & Units 09/03/2015 09/03/2015 07/30/2015  WBC 4.0 - 10.5  K/uL 8.1 9.5 6.5  Hemoglobin 12.0 - 15.0 g/dL 14.6 16.5(H) 14.6  Hematocrit 36.0 - 46.0 % 46.2(H) 52.9(H) 48(A)  Platelets 150 - 400 K/uL 280 292 267   CMP Latest Ref Rng & Units 09/03/2015 09/03/2015 09/01/2015  Glucose 65 - 99 mg/dL 97 112(H) -  BUN 6 - 20 mg/dL 11 15 18   Creatinine 0.44 - 1.00 mg/dL 0.51 0.58 0.5  Sodium 135 - 145 mmol/L 136 141 148(A)  Potassium 3.5 - 5.1 mmol/L 3.4(L) 3.6 3.6  Chloride 101 - 111 mmol/L 102 102 -  CO2 22 - 32  mmol/L 28 30 -  Calcium 8.9 - 10.3 mg/dL 8.8(L) 9.6 -  Total Protein 6.5 - 8.1 g/dL - - -  Total Bilirubin 0.3 - 1.2 mg/dL - - -  Alkaline Phos 25 - 125 U/L - - -  AST 13 - 35 U/L - - -  ALT 7 - 35 U/L - - -   Lipid Panel     Component Value Date/Time   CHOL 165 05/05/2015   TRIG 71 05/05/2015   HDL 49 05/05/2015   LDLCALC 102 05/05/2015   LDLDIRECT 83 12/10/2010 1018    Assessment/plan  Tachycardia bradycardia syndrome S/p pacemaker placement and had generator changed on 09/04/15. monitor clinically. Continue baby aspirin  afib Rate controlled. Continue lopressor 50 mg bid with aspirin 81 mg daily  Chronic asthma No recent exacerbation. Monitor her breathing. Continue her singulair and advair for now with o2 as needed.   Dementia with behavioral disturbance Progressed and decline anticipated. Continue her namenda xr and is now on low dose aricept of 5 mg daily which is to be increased to 10 mg daily on 10/20/15. Continue supportive care with pressure ulcer prophylaxis and fall precautions. Continue remeron    Blanchie Serve, MD Internal Medicine [facility] [address] [place], [place] [address] Cell Phone (Monday-Friday 8 am - 5 pm): [phone] On Call: [phone] and follow prompts after 5 pm and on weekends Office Phone: [phone] Office Fax: [phone]

## 2015-10-29 ENCOUNTER — Encounter: Payer: Self-pay | Admitting: Internal Medicine

## 2015-10-29 ENCOUNTER — Non-Acute Institutional Stay (SKILLED_NURSING_FACILITY): Payer: Medicare Other | Admitting: Internal Medicine

## 2015-10-29 DIAGNOSIS — F0390 Unspecified dementia without behavioral disturbance: Secondary | ICD-10-CM | POA: Diagnosis not present

## 2015-10-29 DIAGNOSIS — E46 Unspecified protein-calorie malnutrition: Secondary | ICD-10-CM | POA: Diagnosis not present

## 2015-10-29 DIAGNOSIS — R627 Adult failure to thrive: Secondary | ICD-10-CM

## 2015-10-29 DIAGNOSIS — R638 Other symptoms and signs concerning food and fluid intake: Secondary | ICD-10-CM

## 2015-10-29 NOTE — Progress Notes (Signed)
Patient ID: Yvonne Buck, female   DOB: 03/30/1929, 82 y.o.   MRN: [id number]      [facility]  Code Status: DNR  Chief Complaint  Patient presents with  . Medical Management of Chronic Issues    Routine Visit    Allergies  Allergen Reactions  . Penicillins Other (See Comments)    Red spots, ### Tolerated Rocephin 12/2012 ###, Has patient had a PCN reaction causing immediate rash, facial/tongue/throat swelling, SOB or lightheadedness with hypotension: Unknown Has patient had a PCN reaction causing severe rash involving mucus membranes or skin necrosis: Unknown Has patient had a PCN reaction that required hospitalization Unknown Has patient had a PCN reaction occurring within the last 10 years: Unknown If all of the above answers are "NO", then may proceed with Cephalosporin u   Advanced Directives 09/03/2015  Does patient have an advance directive? Yes  Type of Advance Directive [place]  Does patient want to make changes to advanced directive? No - Patient declined  Copy of advanced directive(s) in chart? No - copy requested  Would patient like information on creating an advanced directive? -  Pre-existing out of facility DNR order (yellow form or pink MOST form) -     HPI:   82 y/o female patient is seen today for routine visit. She has been having poor po intake and low energy level for few days per nursing. She does not participate in HPI and ROS.   Review of Systems: unable to obtain. No fever, fall, pressure sore per nursing  Past Medical History:  Diagnosis Date  . Anxiety   . Asthma   . Chronic kidney disease   . Depression   . Fracture of greater trochanter of left femur ([place]) 04/02/2013  . Frequent falls   . Hip fracture, left ([place]) 04/02/2013  . Hyperlipidemia   . Hypertension   . Hypothyroidism   . Osteoporosis 06/23/2013  . Pacemaker   . Paroxysmal atrial fibrillation (HCC)    chads2vasc score is at least 6.  She is  not a candidate for anticoagulation due to falls.  Her AF burden is low.  . Pelvic fracture ([place]) 06/23/2013  . Rhinitis, allergic 02/26/2014  . Senile osteoporosis 02/26/2014  . Sick sinus syndrome ([place])   . Stroke ([place])   . Tachycardia-bradycardia syndrome [facility])    s/p [employer] PPM implant in [place]  . TMJ (dislocation of temporomandibular joint) 06/16/2014  . Vitamin D deficiency      Medication reviewed. See [facility]   Medication List       Accurate as of 10/29/15  3:00 PM. Always use your most recent med list.          acetaminophen 325 MG tablet Commonly known as:  TYLENOL Take 650 mg by mouth every 4 (four) hours as needed for moderate pain.   acetaminophen 500 MG tablet Commonly known as:  TYLENOL Take 1,000 mg by mouth 3 (three) times daily.   amLODipine 5 MG tablet Commonly known as:  NORVASC Take 5 mg by mouth daily.   aspirin EC 81 MG tablet Take 81 mg by mouth at bedtime. for AFIB   cholecalciferol 1000 units tablet Commonly known as:  VITAMIN D Take 1,000 Units by mouth at bedtime. for vitamin D deficiency   donepezil 10 MG tablet Commonly known as:  ARICEPT Take 10 mg by mouth at bedtime.   Fluticasone-Salmeterol 250-50 MCG/DOSE Aepb Commonly known as:  ADVAIR Inhale 1 puff into  the lungs 2 (two) times daily. for SOB/Wheeze. Rinse mouth after use.   isosorbide mononitrate 30 MG 24 hr tablet Commonly known as:  IMDUR Take 30 mg by mouth every morning. for hypertension   losartan 100 MG tablet Commonly known as:  COZAAR Take 100 mg by mouth daily.   metoprolol 50 MG tablet Commonly known as:  LOPRESSOR Take 50 mg by mouth 2 (two) times daily.   mirtazapine 15 MG tablet Commonly known as:  REMERON Take 15 mg by mouth daily.   montelukast 10 MG tablet Commonly known as:  SINGULAIR Take 10 mg by mouth at bedtime. for allergies   NAMENDA XR 28 MG Cp24 24 hr capsule Generic drug:  memantine Take 28 mg by mouth daily.   OXYGEN Inhale 2 mLs into  the lungs as needed (to keep sats above 90%).   potassium chloride 10 MEQ tablet Commonly known as:  K-DUR,KLOR-CON Take 10 mEq by mouth daily.   simvastatin 20 MG tablet Commonly known as:  ZOCOR Take 30 mg by mouth daily at 6 PM. Take 20 mg with 10 mg tablet to equal 30 mg at bedtime   simvastatin 10 MG tablet Commonly known as:  ZOCOR Take 10 mg by mouth at bedtime. Take 10 mg with 20 mg tablet to equal 30 mg at bedtime   UNABLE TO FIND Med Name: Med pass 120 mL 2 times daily       Physical exam BP 121/72   Pulse 66   Temp 97.1 F (36.2 C) (Oral)   Resp 18   Ht 5\' 5"  (1.651 m)   Wt 110 lb 9.6 oz (50.2 kg)   SpO2 97%   BMI 18.40 kg/m   Wt Readings from Last 3 Encounters:  10/29/15 110 lb 9.6 oz (50.2 kg)  09/28/15 115 lb 3.2 oz (52.3 kg)  08/31/15 109 lb 9.6 oz (49.7 kg)   Body mass index is 18.4 kg/m.  Constitutional: elderly, frail and thin built female HEENT: MMM, no cervical lymphadenopathy Eyes: PERRLA, EOMI Cardiovascular: Normal rate, regular rhythm and intact distal pulses.    Respiratory: CTAB  Musculoskeletal: able to move all 4 extremities with limited ROM, on geri chair, muscle wasting present Neurological: She is alert and oriented to person only Skin: senile purpura to both arms   Labs CBC Latest Ref Rng & Units 09/03/2015 09/03/2015 07/30/2015  WBC 4.0 - 10.5 K/uL 8.1 9.5 6.5  Hemoglobin 12.0 - 15.0 g/dL 14.6 16.5(H) 14.6  Hematocrit 36.0 - 46.0 % 46.2(H) 52.9(H) 48(A)  Platelets 150 - 400 K/uL 280 292 267   CMP Latest Ref Rng & Units 09/03/2015 09/03/2015 09/01/2015  Glucose 65 - 99 mg/dL 97 112(H) -  BUN 6 - 20 mg/dL 11 15 18   Creatinine 0.44 - 1.00 mg/dL 0.51 0.58 0.5  Sodium 135 - 145 mmol/L 136 141 148(A)  Potassium 3.5 - 5.1 mmol/L 3.4(L) 3.6 3.6  Chloride 101 - 111 mmol/L 102 102 -  CO2 22 - 32 mmol/L 28 30 -  Calcium 8.9 - 10.3 mg/dL 8.8(L) 9.6 -  Total Protein 6.5 - 8.1 g/dL - - -  Total Bilirubin 0.3 - 1.2 mg/dL - - -  Alkaline  Phos 25 - 125 U/L - - -  AST 13 - 35 U/L - - -  ALT 7 - 35 U/L - - -   Lipid Panel     Component Value Date/Time   CHOL 165 05/05/2015   TRIG 71 05/05/2015   HDL 49  05/05/2015   LDLCALC 102 05/05/2015   LDLDIRECT 83 12/10/2010 1018    Assessment/plan  Poor oral intake Rule out infection, check cbc with diff and u/a with c/s. Also check bmp to assess for lyte abnormality. If these workup are unrevealing, consider appetite stimulant. On review of med list, aricept is her new med with recent dosing increased to 10 mg daily. Anorexia and depression along with weight loss are common side effect of aricept. Decrease aricept to 5 mg daily x 1 week and discontinue.   Failure to thrive Has lost 5 lbs since last visit. With advanced dementia and poor oral intake, concern for progressive failure to thrive. Assistance with her ADLs and monitor. Med changes as above.   Protein calorie malnutrition medpass dosing increased by RD today. assistance to be provided with feeding and monitor po intake. With her dementia, decline anticipated  Dementia Continue namenda but wean her off aricept as above    Blanchie Serve, MD Internal Medicine [place], [place] [address] Cell Phone (Monday-Friday 8 am - 5 pm): [phone] On Call: [phone] and follow prompts after 5 pm and on weekends Office Phone: [phone] Office Fax: [phone]

## 2015-10-30 LAB — BASIC METABOLIC PANEL
BUN: 15 mg/dL (ref 4–21)
CREATININE: 0.5 mg/dL (ref 0.5–1.1)
Glucose: 110 mg/dL
POTASSIUM: 4 mmol/L (ref 3.4–5.3)
Sodium: 143 mmol/L (ref 137–147)

## 2015-10-30 LAB — HEPATIC FUNCTION PANEL
ALK PHOS: 76 U/L (ref 25–125)
ALT: 18 U/L (ref 7–35)
AST: 13 U/L (ref 13–35)
Bilirubin, Total: 0.5 mg/dL

## 2015-10-30 LAB — CBC AND DIFFERENTIAL
HCT: 45 % (ref 36–46)
Hemoglobin: 14.1 g/dL (ref 12.0–16.0)
PLATELETS: 277 10*3/uL (ref 150–399)
WBC: 8.6 10*3/mL

## 2015-11-05 LAB — BASIC METABOLIC PANEL
BUN: 18 mg/dL (ref 4–21)
CREATININE: 0.6 mg/dL (ref 0.5–1.1)
GLUCOSE: 97 mg/dL
POTASSIUM: 4.2 mmol/L (ref 3.4–5.3)
SODIUM: 142 mmol/L (ref 137–147)

## 2015-11-05 LAB — HEPATIC FUNCTION PANEL
ALK PHOS: 70 U/L (ref 25–125)
ALT: 22 U/L (ref 7–35)
AST: 15 U/L (ref 13–35)
Bilirubin, Total: 0.5 mg/dL

## 2015-11-05 LAB — CBC AND DIFFERENTIAL
HCT: 41 % (ref 36–46)
Hemoglobin: 12.9 g/dL (ref 12.0–16.0)
Platelets: 298 10*3/uL (ref 150–399)
WBC: 8.9 10^3/mL

## 2015-11-10 ENCOUNTER — Emergency Department (HOSPITAL_COMMUNITY)
Admission: EM | Admit: 2015-11-10 | Discharge: 2015-11-10 | Disposition: A | Discharge status: Home / Self Care | Payer: Medicare Other | Attending: Emergency Medicine | Admitting: Emergency Medicine

## 2015-11-10 ENCOUNTER — Emergency Department (HOSPITAL_COMMUNITY): Payer: Medicare Other

## 2015-11-10 ENCOUNTER — Encounter (HOSPITAL_COMMUNITY): Payer: Self-pay | Admitting: Emergency Medicine

## 2015-11-10 DIAGNOSIS — S0303XA Dislocation of jaw, bilateral, initial encounter: Secondary | ICD-10-CM

## 2015-11-10 DIAGNOSIS — J45909 Unspecified asthma, uncomplicated: Secondary | ICD-10-CM | POA: Diagnosis not present

## 2015-11-10 DIAGNOSIS — X58XXXA Exposure to other specified factors, initial encounter: Secondary | ICD-10-CM | POA: Insufficient documentation

## 2015-11-10 DIAGNOSIS — Y999 Unspecified external cause status: Secondary | ICD-10-CM | POA: Diagnosis not present

## 2015-11-10 DIAGNOSIS — Y939 Activity, unspecified: Secondary | ICD-10-CM | POA: Diagnosis not present

## 2015-11-10 DIAGNOSIS — Y9289 Other specified places as the place of occurrence of the external cause: Secondary | ICD-10-CM | POA: Diagnosis not present

## 2015-11-10 DIAGNOSIS — Z79899 Other long term (current) drug therapy: Secondary | ICD-10-CM | POA: Diagnosis not present

## 2015-11-10 DIAGNOSIS — Z950 Presence of cardiac pacemaker: Secondary | ICD-10-CM | POA: Diagnosis not present

## 2015-11-10 DIAGNOSIS — N189 Chronic kidney disease, unspecified: Secondary | ICD-10-CM | POA: Insufficient documentation

## 2015-11-10 DIAGNOSIS — S0993XA Unspecified injury of face, initial encounter: Secondary | ICD-10-CM | POA: Diagnosis present

## 2015-11-10 DIAGNOSIS — Z7982 Long term (current) use of aspirin: Secondary | ICD-10-CM | POA: Diagnosis not present

## 2015-11-10 DIAGNOSIS — I129 Hypertensive chronic kidney disease with stage 1 through stage 4 chronic kidney disease, or unspecified chronic kidney disease: Secondary | ICD-10-CM | POA: Insufficient documentation

## 2015-11-10 DIAGNOSIS — E039 Hypothyroidism, unspecified: Secondary | ICD-10-CM | POA: Diagnosis not present

## 2015-11-10 DIAGNOSIS — F0390 Unspecified dementia without behavioral disturbance: Secondary | ICD-10-CM | POA: Insufficient documentation

## 2015-11-10 MED ORDER — PROPOFOL 10 MG/ML IV BOLUS
INTRAVENOUS | Status: AC | PRN
  Administered 2015-11-10: 25 mg via INTRAVENOUS

## 2015-11-10 MED ORDER — PROPOFOL 10 MG/ML IV BOLUS
0.5000 mg/kg | Freq: Once | INTRAVENOUS | Status: DC
  Filled 2015-11-10: qty 20

## 2015-11-10 NOTE — ED Triage Notes (Signed)
Per EMS, patient's jaw is out of place. Patient has hx of TMJ. Patient has hx of dementia. Patient is from [facility].

## 2015-11-10 NOTE — ED Notes (Signed)
Bed: [id number] Expected date:  Expected time:  Means of arrival:  Comments: EMS 82 yo F, pain

## 2015-11-10 NOTE — ED Provider Notes (Signed)
[facility] DEPT Provider Note   CSN: [id number] Arrival date & time: 11/10/15  0913     History   Chief Complaint Chief Complaint  Patient presents with  . Jaw Pain    HPI Yvonne Buck is a 82 y.o. female.  Pt presents to the ED with bilateral jaw dislocation.  She is unable to give any history, but according to old charts, she has had this problem in the past.      Past Medical History:  Diagnosis Date  . Anxiety   . Asthma   . Chronic kidney disease   . Depression   . Fracture of greater trochanter of left femur ([place]) 04/02/2013  . Frequent falls   . Hip fracture, left ([place]) 04/02/2013  . Hyperlipidemia   . Hypertension   . Hypothyroidism   . Osteoporosis 06/23/2013  . Pacemaker   . Paroxysmal atrial fibrillation (HCC)    chads2vasc score is at least 6.  She is not a candidate for anticoagulation due to falls.  Her AF burden is low.  . Pelvic fracture ([place]) 06/23/2013  . Rhinitis, allergic 02/26/2014  . Senile osteoporosis 02/26/2014  . Sick sinus syndrome ([place])   . Stroke ([place])   . Tachycardia-bradycardia syndrome [facility])    s/p [employer] PPM implant in [place]  . TMJ (dislocation of temporomandibular joint) 06/16/2014  . Vitamin D deficiency     Patient Active Problem List   Diagnosis Date Noted  . Dysphagia 06/22/2015  . Dementia with behavioral disturbance 04/23/2015  . Vitamin D deficiency 04/23/2015  . Protein-calorie malnutrition ([place]) 04/23/2015  . Insomnia 12/28/2014  . Chronic diastolic congestive heart failure ([place]) 10/15/2014  . Hypertensive heart and renal disease 10/15/2014  . Frequent falls 08/07/2014  . Essential hypertension 08/07/2014  . Protein-calorie malnutrition, severe ([place]) 06/22/2014  . Bronchitis 06/21/2014  . Hypoxia 06/21/2014  . Weakness 06/17/2014  . Steroid-induced hyperglycemia 06/17/2014  . TMJ (dislocation of temporomandibular joint) 06/16/2014  . Acute respiratory failure with hypoxia ([place]) 06/16/2014  . Hypokalemia  06/16/2014  . Sick sinus syndrome ([place]) 03/24/2014  . Hyperlipidemia 02/26/2014  . Senile osteoporosis 02/26/2014  . Asthmatic bronchitis 02/26/2014  . Rhinitis, allergic 02/26/2014  . Essential hypertension, benign 07/07/2013  . Chronic asthma 06/16/2013  . Depression with anxiety 02/05/2013  . Gait instability 01/14/2013  . Left spastic hemiparesis ([place]) 11/16/2012  . Tachycardia-bradycardia syndrome ([place]) 01/06/2011  . Atrial fibrillation ([place]) 01/06/2011  . Hypothyroidism 12/10/2010    Past Surgical History:  Procedure Laterality Date  . CHOLECYSTECTOMY    . EP IMPLANTABLE DEVICE N/A 09/03/2015   Procedure:  PPM Generator Changeout;  Surgeon: Thompson Grayer, MD;  Location: [facility];  Service: Cardiovascular;  Laterality: N/A;  . GALLBLADDER SURGERY    . PACEMAKER INSERTION  01/04/2005   [employer] [place] PPM 1290 [phone]), GDT [phone] atrial lead and 4457 V lead all implanted in [place]      OB History    No data available       Home Medications    Prior to Admission medications   Medication Sig Start Date End Date Taking? Authorizing Provider  acetaminophen (TYLENOL) 325 MG tablet Take 650 mg by mouth every 4 (four) hours as needed for moderate pain.   Yes Historical Provider, MD  acetaminophen (TYLENOL) 500 MG tablet Take 1,000 mg by mouth 3 (three) times daily.   Yes Historical Provider, MD  amLODipine (NORVASC) 5 MG tablet Take 5 mg by  mouth daily.  08/21/14  Yes Historical Provider, MD  aspirin EC 81 MG tablet Take 81 mg by mouth at bedtime. for AFIB   Yes Historical Provider, MD  cholecalciferol (VITAMIN D) 1000 UNITS tablet Take 1,000 Units by mouth at bedtime. for vitamin D deficiency   Yes Historical Provider, MD  Fluticasone-Salmeterol (ADVAIR) 250-50 MCG/DOSE AEPB Inhale 1 puff into the lungs 2 (two) times daily. for SOB/Wheeze. Rinse mouth after use.   Yes Historical Provider, MD  isosorbide mononitrate (IMDUR) 30  MG 24 hr tablet Take 30 mg by mouth every morning. for hypertension   Yes Historical Provider, MD  losartan (COZAAR) 100 MG tablet Take 100 mg by mouth daily.    Yes Historical Provider, MD  memantine (NAMENDA XR) 28 MG CP24 24 hr capsule Take 28 mg by mouth daily.   Yes Historical Provider, MD  metoprolol (LOPRESSOR) 50 MG tablet Take 50 mg by mouth 2 (two) times daily.    Yes Historical Provider, MD  mirtazapine (REMERON) 15 MG tablet Take 15 mg by mouth daily.   Yes Historical Provider, MD  montelukast (SINGULAIR) 10 MG tablet Take 10 mg by mouth at bedtime. for allergies   Yes Historical Provider, MD  potassium chloride (K-DUR,KLOR-CON) 10 MEQ tablet Take 10 mEq by mouth daily.   Yes Historical Provider, MD  simvastatin (ZOCOR) 10 MG tablet Take 10 mg by mouth at bedtime. Take 10 mg with 20 mg tablet to equal 30 mg at bedtime 04/25/15  Yes Historical Provider, MD  simvastatin (ZOCOR) 20 MG tablet Take 30 mg by mouth daily at 6 PM. Take 20 mg with 10 mg tablet to equal 30 mg at bedtime   Yes Historical Provider, MD  UNABLE TO FIND Med Name: Med pass 120 mL 2 times daily   Yes Historical Provider, MD  OXYGEN Inhale 2 mLs into the lungs as needed (to keep sats above 90%).     Historical Provider, MD    Family History Family History  Problem Relation Age of Onset  . Diabetes Mother   . High blood pressure Mother   . Heart Problems Father     Social History Social History  Substance Use Topics  . Smoking status: Never Smoker  . Smokeless tobacco: Never Used  . Alcohol use No     Allergies   Penicillins   Review of Systems Review of Systems  Unable to perform ROS: Dementia     Physical Exam Updated Vital Signs BP 157/67 (BP Location: Right Arm)   Pulse 61   Temp 97.4 F (36.3 C) (Axillary)   Resp 22   Ht 5\' 5"  (1.651 m)   Wt 110 lb (49.9 kg)   SpO2 95%   BMI 18.30 kg/m   Physical Exam  Constitutional: She appears well-developed and well-nourished.  HENT:  Head:  Normocephalic.  Right Ear: External ear normal.  Left Ear: External ear normal.  Nose: Nose normal.  Mouth/Throat: Oropharynx is clear and moist.  Bilateral mandible dislocation  Eyes: EOM are normal. Pupils are equal, round, and reactive to light.  Neck: Normal range of motion. Neck supple.  Cardiovascular: Normal rate, regular rhythm, normal heart sounds and intact distal pulses.   Pulmonary/Chest: Effort normal and breath sounds normal.  Abdominal: Soft. Bowel sounds are normal.  Musculoskeletal: Normal range of motion.  Neurological: She is alert.  Skin: Skin is warm.  Psychiatric: She has a normal mood and affect. Her behavior is normal. Judgment and thought content normal.  Nursing  note and vitals reviewed.    ED Treatments / Results  Labs (all labs ordered are listed, but only abnormal results are displayed) Labs Reviewed - No data to display  EKG  EKG Interpretation None       Radiology Dg Mandible 1-3 Views  Result Date: 11/10/2015 CLINICAL DATA:  Reported bilateral temporomandibular joint dislocations with reduction bilaterally EXAM: MANDIBLE - 1-3 VIEW COMPARISON:  July 24, 2015 FINDINGS: Frontal, Towne's, left lateral, and right lateral views obtained. No evidence of fracture. The mandibular condyles appear to be aligned anatomically with respect to the temporal eminences on each side. No dislocation currently evident. No erosive change. Multiple teeth are missing. No appreciable joint space narrowing. IMPRESSION: No fracture or dislocation evident currently. It should be noted that the temporomandibular joint menisci are not appreciable by radiography. MR would be the imaging study of choice to assess for potential temporomandibular joint meniscal subluxation/dislocation. Electronically Signed   By: Lowella Grip III M.D.   On: 11/10/2015 11:54    Procedures Reduction of dislocation Date/Time: 11/10/2015 10:46 AM Performed by: Isla Pence Authorized by:  Isla Pence  Consent: The procedure was performed in an emergent situation. Verbal consent obtained. Patient identity confirmed: verbally with patient Time out: Immediately prior to procedure a "time out" was called to verify the correct patient, procedure, equipment, support staff and site/side marked as required.  Sedation: Patient sedated: yes Sedatives: propofol  Comments: Pt has dementia, and the reduction of the jaw was an emergent situation, so consent from POA not obtained prior to procedure. Please see nurse's notes for times.  .Sedation Date/Time: 11/10/2015 10:48 AM Performed by: Isla Pence Authorized by: Isla Pence   Consent:    Consent obtained:  Emergent situation Indications:    Procedure performed:  Dislocation reduction   Procedure necessitating sedation performed by:  Physician performing sedation   Intended level of sedation:  Moderate (conscious sedation) Pre-sedation assessment:    NPO status caution: unable to specify NPO status     Pre-sedation assessments completed and reviewed: airway patency, cardiovascular function, hydration status, mental status, nausea/vomiting, pain level, respiratory function and temperature   Immediate pre-procedure details:    Reassessment: Patient reassessed immediately prior to procedure     Reviewed: vital signs     Verified: bag valve mask available, emergency equipment available, intubation equipment available, IV patency confirmed, oxygen available and reversal medications available   Post-procedure details:    Attendance: Constant attendance by certified staff until patient recovered     Complications:  None   Post-sedation assessments completed and reviewed: airway patency, cardiovascular function, hydration status, mental status, nausea/vomiting, pain level, respiratory function and temperature     Patient tolerance:  Tolerated well, no immediate complications   (including critical care time)  Pt given 25  mg of propofol which worked well for patient. After procedure and pt was awake, she was able to open and close her mouth and appears much more comfortable.  Medications Ordered in ED Medications  propofol (DIPRIVAN) 10 mg/mL bolus/IV push 25 mg (0 mg Intravenous Hold 11/10/15 1023)  propofol (DIPRIVAN) 10 mg/mL bolus/IV push (25 mg Intravenous Given 11/10/15 1030)     Initial Impression / Assessment and Plan / ED Course  I have reviewed the triage vital signs and the nursing notes.  Pertinent labs & imaging results that were available during my care of the patient were reviewed by me and considered in my medical decision making (see chart for details).  Clinical Course  Final Clinical Impressions(s) / ED Diagnoses   Final diagnoses:  Dislocation of jaw, bilateral, initial encounter    New Prescriptions New Prescriptions   No medications on file     Isla Pence, MD 11/10/15 1204

## 2015-11-10 NOTE — ED Notes (Signed)
PTAR notified of transportation need 

## 2015-11-10 NOTE — ED Notes (Signed)
Patient transported to X-ray 

## 2015-11-10 NOTE — ED Notes (Signed)
PTAR at bedside to transport patient to nursing facility.

## 2015-11-25 ENCOUNTER — Emergency Department (HOSPITAL_COMMUNITY): Payer: Medicare Other

## 2015-11-25 ENCOUNTER — Emergency Department (HOSPITAL_COMMUNITY)
Admission: EM | Admit: 2015-11-25 | Discharge: 2015-11-25 | Disposition: A | Discharge status: Home / Self Care | Payer: Medicare Other | Attending: Emergency Medicine | Admitting: Emergency Medicine

## 2015-11-25 ENCOUNTER — Encounter (HOSPITAL_COMMUNITY): Payer: Self-pay

## 2015-11-25 DIAGNOSIS — Z8673 Personal history of transient ischemic attack (TIA), and cerebral infarction without residual deficits: Secondary | ICD-10-CM | POA: Diagnosis not present

## 2015-11-25 DIAGNOSIS — Z79899 Other long term (current) drug therapy: Secondary | ICD-10-CM | POA: Diagnosis not present

## 2015-11-25 DIAGNOSIS — I130 Hypertensive heart and chronic kidney disease with heart failure and stage 1 through stage 4 chronic kidney disease, or unspecified chronic kidney disease: Secondary | ICD-10-CM | POA: Insufficient documentation

## 2015-11-25 DIAGNOSIS — N189 Chronic kidney disease, unspecified: Secondary | ICD-10-CM | POA: Insufficient documentation

## 2015-11-25 DIAGNOSIS — Y999 Unspecified external cause status: Secondary | ICD-10-CM | POA: Insufficient documentation

## 2015-11-25 DIAGNOSIS — I5032 Chronic diastolic (congestive) heart failure: Secondary | ICD-10-CM | POA: Diagnosis not present

## 2015-11-25 DIAGNOSIS — Y929 Unspecified place or not applicable: Secondary | ICD-10-CM | POA: Insufficient documentation

## 2015-11-25 DIAGNOSIS — S0993XA Unspecified injury of face, initial encounter: Secondary | ICD-10-CM | POA: Diagnosis present

## 2015-11-25 DIAGNOSIS — J45909 Unspecified asthma, uncomplicated: Secondary | ICD-10-CM | POA: Insufficient documentation

## 2015-11-25 DIAGNOSIS — Z7982 Long term (current) use of aspirin: Secondary | ICD-10-CM | POA: Diagnosis not present

## 2015-11-25 DIAGNOSIS — Y939 Activity, unspecified: Secondary | ICD-10-CM | POA: Diagnosis not present

## 2015-11-25 DIAGNOSIS — X58XXXA Exposure to other specified factors, initial encounter: Secondary | ICD-10-CM | POA: Insufficient documentation

## 2015-11-25 DIAGNOSIS — E039 Hypothyroidism, unspecified: Secondary | ICD-10-CM | POA: Insufficient documentation

## 2015-11-25 DIAGNOSIS — Z950 Presence of cardiac pacemaker: Secondary | ICD-10-CM | POA: Insufficient documentation

## 2015-11-25 DIAGNOSIS — S0300XA Dislocation of jaw, unspecified side, initial encounter: Secondary | ICD-10-CM | POA: Insufficient documentation

## 2015-11-25 LAB — I-STAT VENOUS BLOOD GAS, ED
Acid-Base Excess: 6 mmol/L — ABNORMAL HIGH (ref 0.0–2.0)
BICARBONATE: 27.2 mmol/L (ref 20.0–28.0)
O2 SAT: 85 %
PCO2 VEN: 30.4 mmHg — AB (ref 44.0–60.0)
PH VEN: 7.56 — AB (ref 7.250–7.430)
PO2 VEN: 43 mmHg (ref 32.0–45.0)
TCO2: 28 mmol/L (ref 0–100)

## 2015-11-25 LAB — CBG MONITORING, ED: Glucose-Capillary: 110 mg/dL — ABNORMAL HIGH (ref 65–99)

## 2015-11-25 MED ORDER — PROPOFOL 10 MG/ML IV BOLUS
20.0000 mg | Freq: Once | INTRAVENOUS | Status: DC
  Filled 2015-11-25: qty 20

## 2015-11-25 MED ORDER — SODIUM CHLORIDE 0.9 % IV SOLN
INTRAVENOUS | Status: AC | PRN
  Administered 2015-11-25: 1000 mL via INTRAVENOUS

## 2015-11-25 MED ORDER — PROPOFOL 10 MG/ML IV BOLUS
INTRAVENOUS | Status: AC | PRN
  Administered 2015-11-25: 20 mg via INTRAVENOUS

## 2015-11-25 NOTE — ED Notes (Signed)
Patient transported to X-ray 

## 2015-11-25 NOTE — ED Triage Notes (Addendum)
[facility]- pt here for jaw dislocation, no fall or other trauma. Pt has hx of same. Pt was given 2mcg of Fentanyl PTA. Pt not speaking due to pain. Pt arrives very drowsy after pain meds but does follow commands, vital signs stable.

## 2015-11-25 NOTE — ED Notes (Signed)
PTAR called @ 1433, regarding transportation back to [facility]. No special equipment en route with pt.

## 2015-11-25 NOTE — ED Provider Notes (Signed)
[facility] DEPT Provider Note   CSN: [id number] Arrival date & time: 11/25/15  0803     History   Chief Complaint Chief Complaint  Patient presents with  . Jaw Pain    DISLOCATION    HPI Yvonne Buck is a 82 y.o. female.  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 History limited as patient is largely nonverbal from her dementia. Per report from the nursing facility, patient woke up this morning and was unable to close her mouth. She has a history of similar symptoms in the past. Nursing home denies any falls.  Past Medical History:  Diagnosis Date  . Anxiety   . Asthma   . Chronic kidney disease   . Depression   . Fracture of greater trochanter of left femur ([place]) 04/02/2013  . Frequent falls   . Hip fracture, left ([place]) 04/02/2013  . Hyperlipidemia   . Hypertension   . Hypothyroidism   . Osteoporosis 06/23/2013  . Pacemaker   . Paroxysmal atrial fibrillation (HCC)    chads2vasc score is at least 6.  She is not a candidate for anticoagulation due to falls.  Her AF burden is low.  . Pelvic fracture ([place]) 06/23/2013  . Rhinitis, allergic 02/26/2014  . Senile osteoporosis 02/26/2014  . Sick sinus syndrome ([place])   . Stroke ([place])   . Tachycardia-bradycardia syndrome [facility])    s/p [employer] PPM implant in [place]  . TMJ (dislocation of temporomandibular joint) 06/16/2014  . Vitamin D deficiency     Patient Active Problem List   Diagnosis Date Noted  . Dysphagia 06/22/2015  . Dementia with behavioral disturbance 04/23/2015  . Vitamin D deficiency 04/23/2015  . Protein-calorie malnutrition ([place]) 04/23/2015  . Insomnia 12/28/2014  . Chronic diastolic congestive heart failure ([place]) 10/15/2014  . Hypertensive heart and renal disease 10/15/2014  . Frequent falls 08/07/2014  . Essential hypertension 08/07/2014  . Protein-calorie malnutrition, severe ([place]) 06/22/2014  .  Bronchitis 06/21/2014  . Hypoxia 06/21/2014  . Weakness 06/17/2014  . Steroid-induced hyperglycemia 06/17/2014  . TMJ (dislocation of temporomandibular joint) 06/16/2014  . Acute respiratory failure with hypoxia ([place]) 06/16/2014  . Hypokalemia 06/16/2014  . Sick sinus syndrome ([place]) 03/24/2014  . Hyperlipidemia 02/26/2014  . Senile osteoporosis 02/26/2014  . Asthmatic bronchitis 02/26/2014  . Rhinitis, allergic 02/26/2014  . Essential hypertension, benign 07/07/2013  . Chronic asthma 06/16/2013  . Depression with anxiety 02/05/2013  . Gait instability 01/14/2013  . Left spastic hemiparesis ([place]) 11/16/2012  . Tachycardia-bradycardia syndrome ([place]) 01/06/2011  . Atrial fibrillation ([place]) 01/06/2011  . Hypothyroidism 12/10/2010    Past Surgical History:  Procedure Laterality Date  . CHOLECYSTECTOMY    . EP IMPLANTABLE DEVICE N/A 09/03/2015   Procedure:  PPM Generator Changeout;  Surgeon: Thompson Grayer, MD;  Location: [facility];  Service: Cardiovascular;  Laterality: N/A;  . GALLBLADDER SURGERY    . PACEMAKER INSERTION  01/04/2005   [employer] [place] PPM 1290 [phone]), GDT [phone] atrial lead and 4457 V lead all implanted in [place]      OB History    No data available       Home Medications    Prior to Admission medications   Medication Sig Start Date End Date Taking? Authorizing Provider  acetaminophen (TYLENOL) 325 MG tablet Take 650 mg by mouth every 4 (four) hours as needed for moderate pain.  Historical Provider, MD  acetaminophen (TYLENOL) 500 MG tablet Take 1,000 mg by mouth 3 (three) times daily.    Historical Provider, MD  amLODipine (NORVASC) 5 MG tablet Take 5 mg by mouth daily.  08/21/14   Historical Provider, MD  aspirin EC 81 MG tablet Take 81 mg by mouth at bedtime. for AFIB    Historical Provider, MD  cholecalciferol (VITAMIN D) 1000 UNITS tablet Take 1,000 Units by mouth at bedtime. for vitamin D deficiency     Historical Provider, MD  Fluticasone-Salmeterol (ADVAIR) 250-50 MCG/DOSE AEPB Inhale 1 puff into the lungs 2 (two) times daily. for SOB/Wheeze. Rinse mouth after use.    Historical Provider, MD  isosorbide mononitrate (IMDUR) 30 MG 24 hr tablet Take 30 mg by mouth every morning. for hypertension    Historical Provider, MD  losartan (COZAAR) 100 MG tablet Take 100 mg by mouth daily.     Historical Provider, MD  memantine (NAMENDA XR) 28 MG CP24 24 hr capsule Take 28 mg by mouth daily.    Historical Provider, MD  metoprolol (LOPRESSOR) 50 MG tablet Take 50 mg by mouth 2 (two) times daily.     Historical Provider, MD  mirtazapine (REMERON) 15 MG tablet Take 15 mg by mouth daily.    Historical Provider, MD  montelukast (SINGULAIR) 10 MG tablet Take 10 mg by mouth at bedtime. for allergies    Historical Provider, MD  OXYGEN Inhale 2 mLs into the lungs as needed (to keep sats above 90%).     Historical Provider, MD  potassium chloride (K-DUR,KLOR-CON) 10 MEQ tablet Take 10 mEq by mouth daily.    Historical Provider, MD  simvastatin (ZOCOR) 10 MG tablet Take 10 mg by mouth at bedtime. Take 10 mg with 20 mg tablet to equal 30 mg at bedtime 04/25/15   Historical Provider, MD  simvastatin (ZOCOR) 20 MG tablet Take 30 mg by mouth daily at 6 PM. Take 20 mg with 10 mg tablet to equal 30 mg at bedtime    Historical Provider, MD  UNABLE TO FIND Med Name: Med pass 120 mL 2 times daily    Historical Provider, MD    Family History Family History  Problem Relation Age of Onset  . Diabetes Mother   . High blood pressure Mother   . Heart Problems Father     Social History Social History  Substance Use Topics  . Smoking status: Never Smoker  . Smokeless tobacco: Never Used  . Alcohol use No     Allergies   Penicillins   Review of Systems Review of Systems  Unable to perform ROS: Dementia     Physical Exam Updated Vital Signs BP 150/71   Pulse (!) 59   Temp 97.5 F (36.4 C) (Oral)   Resp 16    Ht 5\' 5"  (1.651 m)   Wt 108 lb (49 kg)   SpO2 99%   BMI 17.97 kg/m   Physical Exam  Constitutional: She is oriented to person, place, and time. She appears well-developed and well-nourished. No distress.  HENT:  Head: Normocephalic and atraumatic.  Lower jaw displaced anteriorly with obvious deformity at the TMJ joints bilaterally. Patient is unable to close her mouth. Oropharynx otherwise patent. No bruising of the face.  Eyes: Conjunctivae are normal.  Neck: Neck supple.  Cardiovascular: Normal rate, regular rhythm and normal heart sounds.  Exam reveals no friction rub.   No murmur heard. Pulmonary/Chest: Effort normal and breath sounds normal. No respiratory distress. She has  no wheezes. She has no rales.  Abdominal: She exhibits no distension.  Musculoskeletal: She exhibits no edema.  Neurological: She is alert and oriented to person, place, and time. She exhibits normal muscle tone.  Skin: Skin is warm. Capillary refill takes less than 2 seconds.  Psychiatric: She has a normal mood and affect.  Nursing note and vitals reviewed.    ED Treatments / Results  Labs (all labs ordered are listed, but only abnormal results are displayed) Labs Reviewed - No data to display  EKG  EKG Interpretation None       Radiology No results found.  Procedures .Sedation Date/Time: 11/25/2015 4:03 PM Performed by: Duffy Bruce Authorized by: Duffy Bruce   Consent:    Consent obtained:  Emergent situation   Consent given by: Emergent due to need for immediate reduction and lack of any available healthcare power of attorney. Universal protocol:    Immediately prior to procedure a time out was called: yes     Patient identity confirmation method:  Arm band Indications:    Procedure performed:  Dislocation reduction   Procedure necessitating sedation performed by:  Physician performing sedation   Intended level of sedation:  Moderate (conscious sedation) Pre-sedation  assessment:    NPO status caution: unable to specify NPO status     Neck mobility: normal     Mouth opening:  3 or more finger widths   Thyromental distance:  4 finger widths   Mallampati score:  I - soft palate, uvula, fauces, pillars visible   Pre-sedation assessments completed and reviewed: airway patency, cardiovascular function, hydration status, nausea/vomiting, pain level, respiratory function and temperature     History of difficult intubation: no   Immediate pre-procedure details:    Reassessment: Patient reassessed immediately prior to procedure     Reviewed: vital signs and NPO status     Verified: bag valve mask available, emergency equipment available, intubation equipment available, IV patency confirmed and oxygen available   Procedure details (see MAR for exact dosages):    Preoxygenation:  Nasal cannula   Sedation:  Propofol   Intra-procedure monitoring:  Blood pressure monitoring, cardiac monitor, continuous capnometry, continuous pulse oximetry, frequent LOC assessments and frequent vital sign checks   Intra-procedure events: none   Post-procedure details:    Attendance: Constant attendance by certified staff until patient recovered     Recovery: Patient returned to pre-procedure baseline     Post-sedation assessments completed and reviewed: airway patency, cardiovascular function, hydration status, mental status, nausea/vomiting, pain level, respiratory function and temperature     Specimens recovered:  None   Patient is stable for discharge or admission: yes     Patient tolerance:  Tolerated well, no immediate complications   Reduction of dislocation Date/Time: 11/25/2015 6:12 PM Performed by: Duffy Bruce Authorized by: Duffy Bruce  Consent: The procedure was performed in an emergent situation. Required items: required blood products, implants, devices, and special equipment available Patient identity confirmed: arm band Time out: Immediately prior to  procedure a "time out" was called to verify the correct patient, procedure, equipment, support staff and site/side marked as required. Preparation: Patient was prepped and draped in the usual sterile fashion. Local anesthesia used: no  Anesthesia: Local anesthesia used: no  Sedation: Patient sedated: yes Sedatives: propofol Vitals: Vital signs were monitored during sedation. Patient tolerance: Patient tolerated the procedure well with no immediate complications Comments: Patient sedated with propofol as above. Constant, direct pressure was then applied downward to the bilateral mandibular bodies  with gentle posterior movement. The jaw was easily relocated. Upon relocation, there was significant ligamentous laxity noted but occlusion appears normal with minimal lateral deviation of the jaw. Patient's pain seems improved.    (including critical care time)  Medications Ordered in ED Medications  propofol (DIPRIVAN) 10 mg/mL bolus/IV push 20 mg (20 mg Intravenous See Procedure Record 11/25/15 0854)  propofol (DIPRIVAN) 10 mg/mL bolus/IV push (20 mg Intravenous Given 11/25/15 0841)  0.9 %  sodium chloride infusion (1,000 mLs Intravenous New Bag/Given 11/25/15 0837)     Initial Impression / Assessment and Plan / ED Course  I have reviewed the triage vital signs and the nursing notes.  Pertinent labs & imaging results that were available during my care of the patient were reviewed by me and considered in my medical decision making (see chart for details).  Clinical Course  82 year old female with past mental history of recurrent jaw dislocations who presents with mandibular dislocation. No preceding trauma. Patient sedated and jaw reduced as above with no immediate complications. Patient is now closing her mouth and appears significant and more comfortable. Plain films show relocation but are concerning for possible zygoma fracture. Per radiology, CT scan obtained which shows that this was  artifact and there are no acute fractures. On CT, the mandible also appears located. Patient has returned to her neurological baseline. Will discharge home with outpatient ENT follow-up.  Final Clinical Impressions(s) / ED Diagnoses   Final diagnoses:  Closed dislocation of jaw, initial encounter      Duffy Bruce, MD 11/25/15 [id number]

## 2015-11-25 NOTE — Sedation Documentation (Signed)
Resp therapy at bedside

## 2015-11-25 NOTE — ED Notes (Signed)
Warm blanket given to pt

## 2015-11-25 NOTE — ED Notes (Signed)
Pt back from X-ray and placed back on monitor 

## 2015-11-25 NOTE — ED Notes (Signed)
MD at bedside to attempt to reset jaw.

## 2015-11-26 ENCOUNTER — Emergency Department (HOSPITAL_COMMUNITY)
Admission: EM | Admit: 2015-11-26 | Discharge: 2015-11-26 | Disposition: A | Discharge status: Home / Self Care | Payer: Medicare Other | Attending: Emergency Medicine | Admitting: Emergency Medicine

## 2015-11-26 ENCOUNTER — Encounter (HOSPITAL_COMMUNITY): Payer: Self-pay

## 2015-11-26 DIAGNOSIS — Z8673 Personal history of transient ischemic attack (TIA), and cerebral infarction without residual deficits: Secondary | ICD-10-CM | POA: Insufficient documentation

## 2015-11-26 DIAGNOSIS — IMO0001 Reserved for inherently not codable concepts without codable children: Secondary | ICD-10-CM

## 2015-11-26 DIAGNOSIS — X58XXXA Exposure to other specified factors, initial encounter: Secondary | ICD-10-CM | POA: Diagnosis not present

## 2015-11-26 DIAGNOSIS — S0301XA Dislocation of jaw, right side, initial encounter: Secondary | ICD-10-CM | POA: Diagnosis not present

## 2015-11-26 DIAGNOSIS — Z950 Presence of cardiac pacemaker: Secondary | ICD-10-CM | POA: Insufficient documentation

## 2015-11-26 DIAGNOSIS — E039 Hypothyroidism, unspecified: Secondary | ICD-10-CM | POA: Diagnosis not present

## 2015-11-26 DIAGNOSIS — I5032 Chronic diastolic (congestive) heart failure: Secondary | ICD-10-CM | POA: Insufficient documentation

## 2015-11-26 DIAGNOSIS — Z7982 Long term (current) use of aspirin: Secondary | ICD-10-CM | POA: Diagnosis not present

## 2015-11-26 DIAGNOSIS — I130 Hypertensive heart and chronic kidney disease with heart failure and stage 1 through stage 4 chronic kidney disease, or unspecified chronic kidney disease: Secondary | ICD-10-CM | POA: Diagnosis not present

## 2015-11-26 DIAGNOSIS — J45909 Unspecified asthma, uncomplicated: Secondary | ICD-10-CM | POA: Insufficient documentation

## 2015-11-26 DIAGNOSIS — S0300XA Dislocation of jaw, unspecified side, initial encounter: Secondary | ICD-10-CM

## 2015-11-26 DIAGNOSIS — Y939 Activity, unspecified: Secondary | ICD-10-CM | POA: Insufficient documentation

## 2015-11-26 DIAGNOSIS — N189 Chronic kidney disease, unspecified: Secondary | ICD-10-CM | POA: Diagnosis not present

## 2015-11-26 DIAGNOSIS — Y999 Unspecified external cause status: Secondary | ICD-10-CM | POA: Insufficient documentation

## 2015-11-26 DIAGNOSIS — Y929 Unspecified place or not applicable: Secondary | ICD-10-CM | POA: Insufficient documentation

## 2015-11-26 MED ORDER — PROPOFOL 10 MG/ML IV BOLUS: 20.0000 mg | INTRAVENOUS | Status: DC | PRN

## 2015-11-26 MED ORDER — PROPOFOL 10 MG/ML IV BOLUS
INTRAVENOUS | Status: AC | PRN
  Administered 2015-11-26: 20 mg via INTRAVENOUS

## 2015-11-26 MED ORDER — PROPOFOL 10 MG/ML IV BOLUS
0.5000 mg/kg | INTRAVENOUS | Status: DC | PRN
  Filled 2015-11-26: qty 20

## 2015-11-26 NOTE — ED Notes (Signed)
Patient transported to X-ray 

## 2015-11-26 NOTE — ED Triage Notes (Signed)
Pt brought in by EMS due to pt jaw dislocation, pt has been multiple times for recurrent problem. Pt not speaking due to pain. Pt alert, but hypertensive.

## 2015-11-26 NOTE — Discharge Instructions (Signed)
Follow-up with a dentist or oral surgeon if able.  Unfortunately, you are at risk for recurrent dislocation when you open up your mouth widely.  Consider a bandage around the head and jaw when sleeping to avoid opening the mouth widely

## 2015-11-26 NOTE — ED Notes (Signed)
Pt is in stable condition upon d/c and is escorted to [facility] via [place]. [place], RN called report to SNF prior to pt d/c.

## 2015-11-26 NOTE — ED Notes (Signed)
Call report to [facility] place.

## 2015-11-26 NOTE — ED Provider Notes (Signed)
[facility] DEPT Provider Note   CSN: [id number] Arrival date & time: 11/26/15  [id number]     History   Chief Complaint Chief Complaint  Patient presents with  . Jaw Pain    HPI Yvonne Buck is a 82 y.o. female.  HPI Patient presents to the emergency room for probable recurrent mandible dislocation.  The patient is a resident of a nursing home. She has dementia and it baseline is not very verbal. The patient has history of recurrent mandible dislocations with multiple visits to the emergency room. According to the EMS report, when nursing staff checked on the patient this morning they noted she was keeping her mouth wide open and unable to close it.  Her appearance was similar to previous episodes of mandible dislocation so she was sent to the emergency room.   The patient is unable to provide any history.  She will look at me when I'm speaking to her but does not follow commands or answer any questions  Past Medical History:  Diagnosis Date  . Anxiety   . Asthma   . Chronic kidney disease   . Depression   . Fracture of greater trochanter of left femur ([place]) 04/02/2013  . Frequent falls   . Hip fracture, left ([place]) 04/02/2013  . Hyperlipidemia   . Hypertension   . Hypothyroidism   . Osteoporosis 06/23/2013  . Pacemaker   . Paroxysmal atrial fibrillation (HCC)    chads2vasc score is at least 6.  She is not a candidate for anticoagulation due to falls.  Her AF burden is low.  . Pelvic fracture ([place]) 06/23/2013  . Rhinitis, allergic 02/26/2014  . Senile osteoporosis 02/26/2014  . Sick sinus syndrome ([place])   . Stroke ([place])   . Tachycardia-bradycardia syndrome [facility])    s/p [employer] PPM implant in [place]  . TMJ (dislocation of temporomandibular joint) 06/16/2014  . Vitamin D deficiency     Patient Active Problem List   Diagnosis Date Noted  . Dysphagia 06/22/2015  . Dementia with behavioral disturbance 04/23/2015  . Vitamin D deficiency 04/23/2015  . Protein-calorie  malnutrition ([place]) 04/23/2015  . Insomnia 12/28/2014  . Chronic diastolic congestive heart failure ([place]) 10/15/2014  . Hypertensive heart and renal disease 10/15/2014  . Frequent falls 08/07/2014  . Essential hypertension 08/07/2014  . Protein-calorie malnutrition, severe ([place]) 06/22/2014  . Bronchitis 06/21/2014  . Hypoxia 06/21/2014  . Weakness 06/17/2014  . Steroid-induced hyperglycemia 06/17/2014  . TMJ (dislocation of temporomandibular joint) 06/16/2014  . Acute respiratory failure with hypoxia ([place]) 06/16/2014  . Hypokalemia 06/16/2014  . Sick sinus syndrome ([place]) 03/24/2014  . Hyperlipidemia 02/26/2014  . Senile osteoporosis 02/26/2014  . Asthmatic bronchitis 02/26/2014  . Rhinitis, allergic 02/26/2014  . Essential hypertension, benign 07/07/2013  . Chronic asthma 06/16/2013  . Depression with anxiety 02/05/2013  . Gait instability 01/14/2013  . Left spastic hemiparesis ([place]) 11/16/2012  . Tachycardia-bradycardia syndrome ([place]) 01/06/2011  . Atrial fibrillation ([place]) 01/06/2011  . Hypothyroidism 12/10/2010    Past Surgical History:  Procedure Laterality Date  . CHOLECYSTECTOMY    . EP IMPLANTABLE DEVICE N/A 09/03/2015   Procedure:  PPM Generator Changeout;  Surgeon: Thompson Grayer, MD;  Location: [facility] CV LAB;  Service: Cardiovascular;  Laterality: N/A;  . GALLBLADDER SURGERY    . PACEMAKER INSERTION  01/04/2005   [employer] Insignia PPM [id number] [phone]), GDT [phone] atrial lead and 4457 V lead all implanted in [place]  History    No data available       Home Medications    Prior to Admission medications   Medication Sig Start Date End Date Taking? Authorizing Provider  acetaminophen (TYLENOL) 325 MG tablet Take 650 mg by mouth every 4 (four) hours as needed for moderate pain.    Historical Provider, MD  acetaminophen (TYLENOL) 500 MG tablet Take 1,000 mg by mouth 3 (three) times daily.    Historical Provider, MD    amLODipine (NORVASC) 5 MG tablet Take 5 mg by mouth daily.  08/21/14   Historical Provider, MD  aspirin EC 81 MG tablet Take 81 mg by mouth at bedtime. for AFIB    Historical Provider, MD  cholecalciferol (VITAMIN D) 1000 UNITS tablet Take 1,000 Units by mouth at bedtime. for vitamin D deficiency    Historical Provider, MD  Fluticasone-Salmeterol (ADVAIR) 250-50 MCG/DOSE AEPB Inhale 1 puff into the lungs 2 (two) times daily. for SOB/Wheeze. Rinse mouth after use.    Historical Provider, MD  isosorbide mononitrate (IMDUR) 30 MG 24 hr tablet Take 30 mg by mouth every morning. for hypertension    Historical Provider, MD  losartan (COZAAR) 100 MG tablet Take 100 mg by mouth daily.     Historical Provider, MD  memantine (NAMENDA XR) 28 MG CP24 24 hr capsule Take 28 mg by mouth daily.    Historical Provider, MD  metoprolol (LOPRESSOR) 50 MG tablet Take 50 mg by mouth 2 (two) times daily.     Historical Provider, MD  mirtazapine (REMERON) 15 MG tablet Take 15 mg by mouth daily.    Historical Provider, MD  montelukast (SINGULAIR) 10 MG tablet Take 10 mg by mouth at bedtime. for allergies    Historical Provider, MD  OXYGEN Inhale 2 mLs into the lungs as needed (to keep sats above 90%).     Historical Provider, MD  potassium chloride (K-DUR,KLOR-CON) 10 MEQ tablet Take 10 mEq by mouth daily.    Historical Provider, MD  simvastatin (ZOCOR) 10 MG tablet Take 30 mg by mouth at bedtime.  04/25/15   Historical Provider, MD  UNABLE TO FIND Take 120 mLs by mouth 3 (three) times daily. Med Name: Med pass    Historical Provider, MD    Family History Family History  Problem Relation Age of Onset  . Diabetes Mother   . High blood pressure Mother   . Heart Problems Father     Social History Social History  Substance Use Topics  . Smoking status: Never Smoker  . Smokeless tobacco: Never Used  . Alcohol use No     Allergies   Penicillins   Review of Systems Review of Systems  All other systems reviewed  and are negative.    Physical Exam Updated Vital Signs BP 195/99   Pulse 67   Temp (!) 96.5 F (35.8 C) (Axillary)   Resp (!) 27   Ht 5\' 5"  (1.651 m)   Wt 49 kg   SpO2 100%   BMI 17.97 kg/m   Physical Exam  Constitutional: No distress.  Elderly, frail  HENT:  Head: Normocephalic and atraumatic.  Right Ear: External ear normal.  Left Ear: External ear normal.  Mouth/Throat: Oropharynx is clear and moist and mucous membranes are normal.  No drooling, pt at rest has her mouth wide open, she does not seem to be able to close her jaw  Eyes: Conjunctivae are normal. Right eye exhibits no discharge. Left eye exhibits no discharge. No scleral icterus.  Neck: Neck supple. No tracheal deviation present.  Cardiovascular: Normal rate, regular rhythm and normal heart sounds.  Exam reveals no friction rub.   No murmur heard. Pulmonary/Chest: Effort normal. No stridor. No respiratory distress. She has no wheezes. She has no rales.  Abdominal: She exhibits no distension.  Musculoskeletal: She exhibits no edema.  Neurological: She is alert. Cranial nerve deficit: no gross deficits.  Skin: Skin is warm and dry. No rash noted.  Psychiatric: She has a normal mood and affect.  Nursing note and vitals reviewed.    ED Treatments / Results  Labs (all labs ordered are listed, but only abnormal results are displayed) Labs Reviewed - No data to display  EKG  EKG Interpretation None       Radiology Dg Mandible 4 Views  Result Date: 11/25/2015 CLINICAL DATA:  82 year old female with a history of mandible dislocation EXAM: MANDIBLE - 4+ VIEW COMPARISON:  11/10/2015 FINDINGS: Questionable right-sided zygomatic fracture on a single image. Limited evaluation of the temporomandibular joints given the positioning/ technique. Multiple dental amalgams and absent mandibular and maxillary teeth. IMPRESSION: Questionable right-sided zygomatic fracture. If there is point tenderness at this site,  recommend maxillofacial CT for further evaluation. Limited evaluation of the bilateral temporomandibular joints, without gross abnormality. If there is ongoing concern, consider maxillofacial CT for more sensitive evaluation. These results were called by telephone at the time of interpretation on 11/25/2015 at 12:17 pm to Dr. Duffy Bruce , who verbally acknowledged these results. Signed, Dulcy Fanny. Earleen Newport, DO [facility] Electronically Signed   By: Corrie Mckusick D.O.   On: 11/25/2015 12:23   Ct Maxillofacial Wo Contrast  Result Date: 11/25/2015 CLINICAL DATA:  Dementia. Trauma. Zygomatic fracture at radiography. EXAM: CT MAXILLOFACIAL WITHOUT CONTRAST TECHNIQUE: Multidetector CT imaging of the maxillofacial structures was performed. Multiplanar CT image reconstructions were also generated. A small metallic BB was placed on the right temple in order to reliably differentiate right from left. COMPARISON:  Radiography same day FINDINGS: Osseous: No facial fracture. Specifically, no zygomatic arch fracture. Temporomandibular joints appear normal. Orbits: Normal Sinuses: Clear and normal. Soft tissues: No pathologic finding other than carotid artery calcification. Limited intracranial: No acute brain pathology. Probable calcified meningioma of the right frontal calvarium measuring about a cm in size, not significant. There is atherosclerotic calcification of the major vessels at the base of the brain. IMPRESSION: No facial fracture. Plain radiographic findings were spurious. No other regional pathology. Electronically Signed   By: Nelson Chimes M.D.   On: 11/25/2015 14:07    Procedures .Sedation Date/Time: 11/26/2015 10:32 AM Performed by: Dorie Rank Authorized by: Dorie Rank   Consent:    Consent obtained: pt unable to consent. Indications:    Procedure performed:  Dislocation reduction   Procedure necessitating sedation performed by:  Physician  performing sedation   Intended level of sedation:  Deep Pre-sedation assessment:    NPO status caution: unable to specify NPO status and urgency dictates proceeding with non-ideal NPO status     ASA classification: class 2 - patient with mild systemic disease     Neck mobility: normal     Mouth opening:  3 or more finger widths   Mallampati score:  I - soft palate, uvula, fauces, pillars visible   Pre-sedation assessments completed and reviewed: airway patency, cardiovascular function, hydration status, mental status, nausea/vomiting, pain level, respiratory function and temperature     History of difficult intubation: no     Pre-sedation assessment  completed:  11/26/2015 10:13 AM Immediate pre-procedure details:    Reassessment: Patient reassessed immediately prior to procedure     Reviewed: vital signs     Verified: bag valve mask available, emergency equipment available, IV patency confirmed and oxygen available   Procedure details (see MAR for exact dosages):    Sedation start time:  11/26/2015 10:23 AM   Preoxygenation:  Nasal cannula   Sedation:  Propofol   Intra-procedure monitoring:  Blood pressure monitoring, cardiac monitor, continuous capnometry, continuous pulse oximetry, frequent LOC assessments and frequent vital sign checks   Intra-procedure events: none     Sedation end time:  11/26/2015 10:34 AM Post-procedure details:    Post-sedation assessment completed:  11/26/2015 10:34 AM   Attendance: Constant attendance by certified staff until patient recovered     Recovery: Patient returned to pre-procedure baseline     Estimated blood loss (see I/O flowsheets): no     Complications:  None   Post-sedation assessments completed and reviewed: airway patency, cardiovascular function, hydration status, mental status, nausea/vomiting, respiratory function and temperature     Specimens recovered:  None   Patient tolerance:  Tolerated well, no immediate complications Reduction of  dislocation Date/Time: 11/26/2015 10:35 AM Performed by: Dorie Rank Authorized by: Dorie Rank  Consent: Verbal consent not obtained. Written consent not obtained. Risks and benefits discussed: pt is demented, unable to consent, no family at the bedside,  Time out: Immediately prior to procedure a "time out" was called to verify the correct patient, procedure, equipment, support staff and site/side marked as required.  Sedation: Patient sedated: yes Patient tolerance: Patient tolerated the procedure well with no immediate complications Comments: Direct downward and posterior pressure applied bilateral lower mandibles pressing on the lower molars.   Palpable clunk as mandible reduced.  Pt now able to close jaw.  No dental malocclusion.  Normal orientation of the jaw    (including critical care time)  Medications Ordered in ED Medications  propofol (DIPRIVAN) 10 mg/mL bolus/IV push 20 mg (not administered)  propofol (DIPRIVAN) 10 mg/mL bolus/IV push (20 mg Intravenous Given 11/26/15 1019)     Initial Impression / Assessment and Plan / ED Course  I have reviewed the triage vital signs and the nursing notes.  Pertinent labs & imaging results that were available during my care of the patient were reviewed by me and considered in my medical decision making (see chart for details).  Clinical Course  Comment By Time  Previous visit reviewed.  Pt was just seen yesterday.  Follow up images were negative for fx or dislocation.  I suspect recurrent dislocation clinically.  Will sedate and plan on reduction. Dorie Rank, MD 10/05 [phone]  Pt was brought to xray to get a panorex.  She is not able to comply with the exam.  Pt is demented.  She was not able to follow the commands.  Clinically her mandible appears reduced.  She smiles when I speak to her.   She is comfortable.  Stable for dc back to the nursing facility. Dorie Rank, MD 10/05 1122   Recommended to the nursing facility to  Consider some type of  bandage or consider seeing an oral surgeon for suggestions on avoiding a recurrent dislocation.   Final Clinical Impressions(s) / ED Diagnoses   Final diagnoses:  Dislocation  Closed dislocation of mandible, initial encounter    New Prescriptions New Prescriptions   No medications on file     Dorie Rank, MD 11/26/15 1126

## 2015-12-07 ENCOUNTER — Ambulatory Visit (INDEPENDENT_AMBULATORY_CARE_PROVIDER_SITE_OTHER): Payer: Medicare Other | Admitting: Internal Medicine

## 2015-12-07 ENCOUNTER — Encounter: Payer: Self-pay | Admitting: Internal Medicine

## 2015-12-07 VITALS — BP 106/68 | HR 60 | Ht 64.5 in | Wt 104.0 lb

## 2015-12-07 DIAGNOSIS — I10 Essential (primary) hypertension: Secondary | ICD-10-CM | POA: Diagnosis not present

## 2015-12-07 DIAGNOSIS — Z950 Presence of cardiac pacemaker: Secondary | ICD-10-CM | POA: Diagnosis not present

## 2015-12-07 DIAGNOSIS — I480 Paroxysmal atrial fibrillation: Secondary | ICD-10-CM

## 2015-12-07 DIAGNOSIS — I495 Sick sinus syndrome: Secondary | ICD-10-CM

## 2015-12-07 NOTE — Patient Instructions (Signed)
Medication Instructions:  Your physician recommends that you continue on your current medications as directed. Please refer to the Current Medication list given to you today.   Labwork: None ordered   Testing/Procedures: None ordered   Follow-Up: Your physician wants you to follow-up in: 12 months with Chanetta Marshall, NP You will receive a reminder letter in the mail two months in advance. If you don't receive a letter, please call our office to schedule the follow-up appointment.   Remote monitoring is used to monitor your Pacemaker  from home. This monitoring reduces the number of office visits required to check your device to one time per year. It allows [place] to keep an eye on the functioning of your device to ensure it is working properly. You are scheduled for a device check from home on 03/07/16. You may send your transmission at any time that day. If you have a wireless device, the transmission will be sent automatically. After your physician reviews your transmission, you will receive a postcard with your next transmission date.      Any Other Special Instructions Will Be Listed Below (If Applicable).     If you need a refill on your cardiac medications before your next appointment, please call your pharmacy.

## 2015-12-07 NOTE — Progress Notes (Signed)
Electrophysiology Office Note   Date:  12/07/2015   ID:  Yvonne Buck, DOB 08/05/1929, MRN [id number]  PCP:  Blanchie Serve, MD  Primary Electrophysiologist: Thompson Grayer, MD    CC: bradycardia   History of Present Illness: Yvonne Buck is a 81 y.o. female who presents today for electrophysiology evaluation.   Since her recent generator change, she has done reasonably well.  She has advanced dementia.  No complaints today.     Past Medical History:  Diagnosis Date  . Anxiety   . Asthma   . Chronic kidney disease   . Depression   . Fracture of greater trochanter of left femur ([place]) 04/02/2013  . Frequent falls   . Hip fracture, left ([place]) 04/02/2013  . Hyperlipidemia   . Hypertension   . Hypothyroidism   . Osteoporosis 06/23/2013  . Pacemaker   . Paroxysmal atrial fibrillation (HCC)    chads2vasc score is at least 6.  She is not a candidate for anticoagulation due to falls.  Her AF burden is low.  . Pelvic fracture ([place]) 06/23/2013  . Rhinitis, allergic 02/26/2014  . Senile osteoporosis 02/26/2014  . Sick sinus syndrome ([place])   . Stroke ([place])   . Tachycardia-bradycardia syndrome [facility])    s/p [employer] PPM implant in [place]  . TMJ (dislocation of temporomandibular joint) 06/16/2014  . Vitamin D deficiency    Past Surgical History:  Procedure Laterality Date  . CHOLECYSTECTOMY    . EP IMPLANTABLE DEVICE N/A 09/03/2015   Procedure:  PPM Generator Changeout;  Surgeon: Thompson Grayer, MD;  Location: [facility] CV LAB;  Service: Cardiovascular;  Laterality: N/A;  . GALLBLADDER SURGERY    . PACEMAKER INSERTION  01/04/2005   [employer] [place] PPM 1290 [phone]), GDT [phone] atrial lead and 4457 V lead all implanted in [place]  . TONSILLECTOMY AND ADENOIDECTOMY       Current Outpatient Prescriptions  Medication Sig Dispense Refill  . acetaminophen (TYLENOL) 325 MG tablet Take 650 mg by mouth every 4 (four) hours as needed for moderate pain.    Marland Kitchen acetaminophen (TYLENOL)  500 MG tablet Take 1,000 mg by mouth 3 (three) times daily.    Marland Kitchen amLODipine (NORVASC) 5 MG tablet Take 5 mg by mouth daily.     Marland Kitchen aspirin EC 81 MG tablet Take 81 mg by mouth at bedtime. for AFIB    . cholecalciferol (VITAMIN D) 1000 UNITS tablet Take 1,000 Units by mouth at bedtime. for vitamin D deficiency    . Fluticasone-Salmeterol (ADVAIR) 250-50 MCG/DOSE AEPB Inhale 1 puff into the lungs 2 (two) times daily. for SOB/Wheeze. Rinse mouth after use.    . isosorbide mononitrate (IMDUR) 30 MG 24 hr tablet Take 30 mg by mouth every morning. for hypertension    . losartan (COZAAR) 100 MG tablet Take 100 mg by mouth daily.     . memantine (NAMENDA XR) 28 MG CP24 24 hr capsule Take 28 mg by mouth daily.    . metoprolol (LOPRESSOR) 50 MG tablet Take 50 mg by mouth 2 (two) times daily.     . mirtazapine (REMERON) 15 MG tablet Take 15 mg by mouth daily.    . montelukast (SINGULAIR) 10 MG tablet Take 10 mg by mouth at bedtime. for allergies    . OXYGEN Inhale 2 mLs into the lungs as needed (to keep sats above 90%).     . potassium chloride (K-DUR,KLOR-CON) 10 MEQ tablet Take 10 mEq by mouth daily.    Marland Kitchen  simvastatin (ZOCOR) 10 MG tablet Take 30 mg by mouth at bedtime.     Marland Kitchen UNABLE TO FIND Take 120 mLs by mouth 3 (three) times daily. Med Name: Med pass     No current facility-administered medications for this visit.     Allergies:   Penicillins   Social History:  The patient  reports that she has never smoked. She has never used smokeless tobacco. She reports that she does not drink alcohol or use drugs.   Family History:  The patient's family history includes Diabetes in her mother; Heart Problems in her father; High blood pressure in her mother.    PHYSICAL EXAM: VS:  BP 106/68   Pulse 60   Ht 5' 4.5" (1.638 m)   Wt 104 lb (47.2 kg)   BMI 17.58 kg/m  , BMI Body mass index is 17.58 kg/m. GEN: chronically ill appearing today, in no acute distress  HEENT: decreased hearing Neck: no JVD,  carotid bruits, or masses Cardiac: RRR; no murmurs, rubs, or gallops,no edema  Respiratory:  clear to auscultation bilaterally, normal work of breathing GI: soft, nontender, nondistended, + BS MS: thin and frail Skin: warm and dry, device pocket is well healed Neuro:  Strength and sensation are intact Psych: euthymic mood, full affect  Device interrogation is reviewed today in detail.  See PaceArt for details.   Recent Labs: 05/05/2015: TSH 0.57 06/02/2015: ALT 13 09/03/2015: BUN 11; Creatinine, Ser 0.51; Hemoglobin 14.6; Platelets 280; Potassium 3.4; Sodium 136    Lipid Panel     Component Value Date/Time   CHOL 165 05/05/2015   TRIG 71 05/05/2015   HDL 49 05/05/2015   LDLCALC 102 05/05/2015   LDLDIRECT 83 12/10/2010 1018     Wt Readings from Last 3 Encounters:  12/07/15 104 lb (47.2 kg)  11/26/15 108 lb (49 kg)  11/25/15 108 lb (49 kg)    ASSESSMENT AND PLAN:  1.  Sick sinus syndrome Normal pacemaker function See Pace Art report No changes today  2. afib Well controlled  chads2vasc score is at least 6.  She is not a candidate for anticoagulation due to falls.  Her AF burden is low.  3. HTN Stable No change required today  [place] Return in 1 year to see EP NP.  We may consider following remotely only if her dementia advances.  Army Fossa, MD  12/07/2015 10:53 AM     [facility] [address] [place] [address] [phone] (office) [phone] (fax)

## 2015-12-17 ENCOUNTER — Encounter (HOSPITAL_COMMUNITY): Payer: Self-pay | Admitting: *Deleted

## 2015-12-17 ENCOUNTER — Emergency Department (HOSPITAL_COMMUNITY)
Admission: EM | Admit: 2015-12-17 | Discharge: 2015-12-17 | Disposition: A | Discharge status: Home / Self Care | Payer: Medicare Other | Attending: Emergency Medicine | Admitting: Emergency Medicine

## 2015-12-17 DIAGNOSIS — F0390 Unspecified dementia without behavioral disturbance: Secondary | ICD-10-CM | POA: Diagnosis not present

## 2015-12-17 DIAGNOSIS — Y999 Unspecified external cause status: Secondary | ICD-10-CM | POA: Diagnosis not present

## 2015-12-17 DIAGNOSIS — X58XXXA Exposure to other specified factors, initial encounter: Secondary | ICD-10-CM | POA: Diagnosis not present

## 2015-12-17 DIAGNOSIS — Z950 Presence of cardiac pacemaker: Secondary | ICD-10-CM | POA: Diagnosis not present

## 2015-12-17 DIAGNOSIS — S0993XA Unspecified injury of face, initial encounter: Secondary | ICD-10-CM | POA: Diagnosis present

## 2015-12-17 DIAGNOSIS — Y939 Activity, unspecified: Secondary | ICD-10-CM | POA: Diagnosis not present

## 2015-12-17 DIAGNOSIS — E039 Hypothyroidism, unspecified: Secondary | ICD-10-CM | POA: Insufficient documentation

## 2015-12-17 DIAGNOSIS — Z7982 Long term (current) use of aspirin: Secondary | ICD-10-CM | POA: Insufficient documentation

## 2015-12-17 DIAGNOSIS — N189 Chronic kidney disease, unspecified: Secondary | ICD-10-CM | POA: Insufficient documentation

## 2015-12-17 DIAGNOSIS — Z79899 Other long term (current) drug therapy: Secondary | ICD-10-CM | POA: Diagnosis not present

## 2015-12-17 DIAGNOSIS — S0300XA Dislocation of jaw, unspecified side, initial encounter: Secondary | ICD-10-CM

## 2015-12-17 DIAGNOSIS — I5032 Chronic diastolic (congestive) heart failure: Secondary | ICD-10-CM | POA: Insufficient documentation

## 2015-12-17 DIAGNOSIS — Y92129 Unspecified place in nursing home as the place of occurrence of the external cause: Secondary | ICD-10-CM | POA: Diagnosis not present

## 2015-12-17 DIAGNOSIS — J45909 Unspecified asthma, uncomplicated: Secondary | ICD-10-CM | POA: Insufficient documentation

## 2015-12-17 DIAGNOSIS — S0302XA Dislocation of jaw, left side, initial encounter: Secondary | ICD-10-CM | POA: Diagnosis not present

## 2015-12-17 DIAGNOSIS — I130 Hypertensive heart and chronic kidney disease with heart failure and stage 1 through stage 4 chronic kidney disease, or unspecified chronic kidney disease: Secondary | ICD-10-CM | POA: Insufficient documentation

## 2015-12-17 NOTE — Discharge Instructions (Signed)
Your jaw was dislocated again. You may need follow-up with oral surgery for further management. Try a soft diet with minimal mouth opening the next few days. Try and keep yourself hydrated.

## 2015-12-17 NOTE — ED Notes (Signed)
Bed: [id number] Expected date:  Expected time:  Means of arrival:  Comments: EMS-jaw pain

## 2015-12-17 NOTE — ED Provider Notes (Signed)
[facility] [facility] DEPT Provider Note   CSN: [id number] Arrival date & time: 12/17/15  0755     History   Chief Complaint Chief Complaint  Patient presents with  . Jaw Pain   Level 5 caveat due to dementia. HPI Yvonne Buck is a 82 y.o. female.  HPI Patient has dementia and his baseline and minimally verbal. Reportedly has a jaw dislocation. She has had frequent dislocations in the past. Patient is sitting in the room with her mouth open and she does not appear to be able to close it. Appears slightly deviated to the left. Tongue is dry. She has had 8 visits to the ER in the last year for jaw dislocation. Past Medical History:  Diagnosis Date  . Anxiety   . Asthma   . Chronic kidney disease   . Depression   . Fracture of greater trochanter of left femur ([place]) 04/02/2013  . Frequent falls   . Hip fracture, left ([place]) 04/02/2013  . Hyperlipidemia   . Hypertension   . Hypothyroidism   . Osteoporosis 06/23/2013  . Pacemaker   . Paroxysmal atrial fibrillation (HCC)    chads2vasc score is at least 6.  She is not a candidate for anticoagulation due to falls.  Her AF burden is low.  . Pelvic fracture ([place]) 06/23/2013  . Rhinitis, allergic 02/26/2014  . Senile osteoporosis 02/26/2014  . Sick sinus syndrome ([place])   . Stroke ([place])   . Tachycardia-bradycardia syndrome [facility])    s/p [employer] PPM implant in [place]  . TMJ (dislocation of temporomandibular joint) 06/16/2014  . Vitamin D deficiency     Patient Active Problem List   Diagnosis Date Noted  . Dysphagia 06/22/2015  . Dementia with behavioral disturbance 04/23/2015  . Vitamin D deficiency 04/23/2015  . Protein-calorie malnutrition ([place]) 04/23/2015  . Insomnia 12/28/2014  . Chronic diastolic congestive heart failure ([place]) 10/15/2014  . Hypertensive heart and renal disease 10/15/2014  . Frequent falls 08/07/2014  . Essential hypertension 08/07/2014  . Protein-calorie malnutrition, severe ([place]) 06/22/2014  . Bronchitis  06/21/2014  . Hypoxia 06/21/2014  . Weakness 06/17/2014  . Steroid-induced hyperglycemia 06/17/2014  . TMJ (dislocation of temporomandibular joint) 06/16/2014  . Acute respiratory failure with hypoxia ([place]) 06/16/2014  . Hypokalemia 06/16/2014  . Sick sinus syndrome ([place]) 03/24/2014  . Hyperlipidemia 02/26/2014  . Senile osteoporosis 02/26/2014  . Asthmatic bronchitis 02/26/2014  . Rhinitis, allergic 02/26/2014  . Essential hypertension, benign 07/07/2013  . Chronic asthma 06/16/2013  . Depression with anxiety 02/05/2013  . Gait instability 01/14/2013  . Left spastic hemiparesis ([place]) 11/16/2012  . Tachycardia-bradycardia syndrome ([place]) 01/06/2011  . Atrial fibrillation ([place]) 01/06/2011  . Hypothyroidism 12/10/2010    Past Surgical History:  Procedure Laterality Date  . CHOLECYSTECTOMY    . EP IMPLANTABLE DEVICE N/A 09/03/2015   Gen change with a St Jude Mediacl AssurityMRI DR pacemaker  . GALLBLADDER SURGERY    . PACEMAKER INSERTION  01/04/2005   [employer] [place] PPM [phone]), GDT [phone] atrial lead and 4457 V lead all implanted in [place]      OB History    No data available       Home Medications    Prior to Admission medications   Medication Sig Start Date End Date Taking? Authorizing Provider  acetaminophen (TYLENOL) 325 MG tablet Take 650 mg by mouth every 4 (four) hours as needed for moderate pain.    Historical Provider, MD  acetaminophen (TYLENOL) 500 MG  tablet Take 1,000 mg by mouth 3 (three) times daily.    Historical Provider, MD  amLODipine (NORVASC) 5 MG tablet Take 5 mg by mouth daily.  08/21/14   Historical Provider, MD  aspirin EC 81 MG tablet Take 81 mg by mouth at bedtime. for AFIB    Historical Provider, MD  cholecalciferol (VITAMIN D) 1000 UNITS tablet Take 1,000 Units by mouth at bedtime. for vitamin D deficiency    Historical Provider, MD  Fluticasone-Salmeterol (ADVAIR) 250-50 MCG/DOSE AEPB Inhale 1  puff into the lungs 2 (two) times daily. for SOB/Wheeze. Rinse mouth after use.    Historical Provider, MD  isosorbide mononitrate (IMDUR) 30 MG 24 hr tablet Take 30 mg by mouth every morning. for hypertension    Historical Provider, MD  losartan (COZAAR) 100 MG tablet Take 100 mg by mouth daily.     Historical Provider, MD  memantine (NAMENDA XR) 28 MG CP24 24 hr capsule Take 28 mg by mouth daily.    Historical Provider, MD  metoprolol (LOPRESSOR) 50 MG tablet Take 50 mg by mouth 2 (two) times daily.     Historical Provider, MD  mirtazapine (REMERON) 15 MG tablet Take 15 mg by mouth daily.    Historical Provider, MD  montelukast (SINGULAIR) 10 MG tablet Take 10 mg by mouth at bedtime. for allergies    Historical Provider, MD  OXYGEN Inhale 2 mLs into the lungs as needed (to keep sats above 90%).     Historical Provider, MD  potassium chloride (K-DUR,KLOR-CON) 10 MEQ tablet Take 10 mEq by mouth daily.    Historical Provider, MD  simvastatin (ZOCOR) 10 MG tablet Take 30 mg by mouth at bedtime.  04/25/15   Historical Provider, MD  UNABLE TO FIND Take 120 mLs by mouth 3 (three) times daily. Med Name: Med pass    Historical Provider, MD    Family History Family History  Problem Relation Age of Onset  . Diabetes Mother   . High blood pressure Mother   . Heart Problems Father     Social History Social History  Substance Use Topics  . Smoking status: Never Smoker  . Smokeless tobacco: Never Used  . Alcohol use No     Allergies   Penicillins   Review of Systems Review of Systems  Unable to perform ROS: Dementia     Physical Exam Updated Vital Signs BP 195/86   Pulse 66   Temp 97.7 F (36.5 C) (Axillary) Comment: Was not able to get oral temp. Pt cannot close mouth.  Resp 15   SpO2 91%   Physical Exam  Constitutional: She appears well-developed.  HENT:  Mandible open and slightly deviated to left. Tongue is dry.  Neck: Neck supple.  Cardiovascular: Normal rate.     Pulmonary/Chest: Effort normal.  Abdominal: Soft.  Musculoskeletal: She exhibits no edema.  Neurological: She is alert.  Patient looks at me when I talk but is nonverbal. [name] slightly withdrawn reduction.  Skin: Skin is warm. Capillary refill takes less than 2 seconds.     ED Treatments / Results  Labs (all labs ordered are listed, but only abnormal results are displayed) Labs Reviewed - No data to display  EKG  EKG Interpretation None       Radiology No results found.  Procedures Reduction of dislocation Date/Time: 12/17/2015 8:00 AM Performed by: Davonna Belling Authorized by: Davonna Belling  Consent: Written consent not obtained. Consent given by: unable to consent. Local anesthesia used: no  Anesthesia: Local  anesthesia used: no  Sedation: Patient sedated: no Comments: Thumbs were placed internally on the posterior teeth of the mandible. With some gentle manipulation the left TMJ was reduced. Some more difficulty with the right side. I took my hands out and patient self reduced. Much better dental alignment and patient was able to close her jaw.    (including critical care time)  Medications Ordered in ED Medications - No data to display   Initial Impression / Assessment and Plan / ED Course  I have reviewed the triage vital signs and the nursing notes.  Pertinent labs & imaging results that were available during my care of the patient were reviewed by me and considered in my medical decision making (see chart for details).  Clinical Course    Patient with recurrent mandibular dislocation. Reduced with gentle manipulation. Discharge back to nursing home. Has had frequent recurrent dislocations.    Final Clinical Impressions(s) / ED Diagnoses   Final diagnoses:  Jaw dislocation, initial encounter    New Prescriptions New Prescriptions   No medications on file     Davonna Belling, MD 12/17/15 0830

## 2015-12-17 NOTE — ED Notes (Signed)
PTAR paged, attempted to call report to [facility], placed on hold for 10 minutes, and call was disconnected after that.

## 2015-12-17 NOTE — ED Triage Notes (Signed)
Per EMS pt coming from [facility] home with c/o jaw pain and possible jaw dislocation. Per EMS nursing home staff reports pt has recurrent issues with TMJ, and her jaw is often "poping out of place". This morning pt is unable to close her mouth, and is having difficulty speaking.

## 2016-01-11 ENCOUNTER — Emergency Department (HOSPITAL_COMMUNITY)
Admission: EM | Admit: 2016-01-11 | Discharge: 2016-01-11 | Disposition: A | Discharge status: Home / Self Care | Payer: Medicare Other | Attending: Emergency Medicine | Admitting: Emergency Medicine

## 2016-01-11 ENCOUNTER — Encounter (HOSPITAL_COMMUNITY): Payer: Self-pay | Admitting: *Deleted

## 2016-01-11 DIAGNOSIS — Y999 Unspecified external cause status: Secondary | ICD-10-CM | POA: Insufficient documentation

## 2016-01-11 DIAGNOSIS — S0300XA Dislocation of jaw, unspecified side, initial encounter: Secondary | ICD-10-CM | POA: Diagnosis present

## 2016-01-11 DIAGNOSIS — Z7982 Long term (current) use of aspirin: Secondary | ICD-10-CM | POA: Diagnosis not present

## 2016-01-11 DIAGNOSIS — I130 Hypertensive heart and chronic kidney disease with heart failure and stage 1 through stage 4 chronic kidney disease, or unspecified chronic kidney disease: Secondary | ICD-10-CM | POA: Insufficient documentation

## 2016-01-11 DIAGNOSIS — I5032 Chronic diastolic (congestive) heart failure: Secondary | ICD-10-CM | POA: Diagnosis not present

## 2016-01-11 DIAGNOSIS — X58XXXA Exposure to other specified factors, initial encounter: Secondary | ICD-10-CM | POA: Insufficient documentation

## 2016-01-11 DIAGNOSIS — J45909 Unspecified asthma, uncomplicated: Secondary | ICD-10-CM | POA: Insufficient documentation

## 2016-01-11 DIAGNOSIS — Z8673 Personal history of transient ischemic attack (TIA), and cerebral infarction without residual deficits: Secondary | ICD-10-CM | POA: Insufficient documentation

## 2016-01-11 DIAGNOSIS — Y939 Activity, unspecified: Secondary | ICD-10-CM | POA: Insufficient documentation

## 2016-01-11 DIAGNOSIS — N189 Chronic kidney disease, unspecified: Secondary | ICD-10-CM | POA: Diagnosis not present

## 2016-01-11 DIAGNOSIS — Z950 Presence of cardiac pacemaker: Secondary | ICD-10-CM | POA: Diagnosis not present

## 2016-01-11 DIAGNOSIS — Y929 Unspecified place or not applicable: Secondary | ICD-10-CM | POA: Diagnosis not present

## 2016-01-11 MED ORDER — FENTANYL CITRATE (PF) 100 MCG/2ML IJ SOLN
50.0000 ug | Freq: Once | INTRAMUSCULAR | Status: AC
  Administered 2016-01-11: 50 ug via INTRAVENOUS
  Filled 2016-01-11: qty 2

## 2016-01-11 MED ORDER — NALOXONE HCL 0.4 MG/ML IJ SOLN
0.4000 mg | Freq: Once | INTRAMUSCULAR | Status: AC
  Administered 2016-01-11: 0.4 mg via INTRAVENOUS
  Filled 2016-01-11: qty 1

## 2016-01-11 MED ORDER — FLUMAZENIL 0.5 MG/5ML IV SOLN
0.5000 mg | Freq: Once | INTRAVENOUS | Status: AC
  Administered 2016-01-11: 0.5 mg via INTRAVENOUS
  Filled 2016-01-11: qty 5

## 2016-01-11 MED ORDER — MIDAZOLAM HCL 2 MG/2ML IJ SOLN
2.0000 mg | Freq: Once | INTRAMUSCULAR | Status: AC
  Administered 2016-01-11: 2 mg via INTRAVENOUS
  Filled 2016-01-11: qty 2

## 2016-01-11 NOTE — ED Provider Notes (Signed)
.  Sedation Date/Time: 01/11/2016 11:19 AM Performed by: Milton Ferguson Authorized by: Milton Ferguson   Consent:    Consent obtained:  Emergent situation   Consent given by:  Healthcare agent   Alternatives discussed:  Analgesia without sedation Universal protocol:    Procedure explained and questions answered to patient or proxy's satisfaction: yes     Immediately prior to procedure a time out was called: yes     Patient identity confirmation method:  Arm band Indications:    Sedation is required to allow for: Dislocation of patella.   Procedure necessitating sedation performed by:  Physician performing sedation Comments:     Patient had a dislocated jaw. She was given 50 of fentanyl and 2 of Versed and the procedure was performed to reduce the jaw. Patient tolerated procedure well she was given some Romazicon after the procedure.   M[place] Ferguson, MD 01/11/16 [phone]

## 2016-01-11 NOTE — ED Provider Notes (Signed)
[place] DEPT Provider Note   CSN: [id number] Arrival date & time: 01/11/16  1024     History   Chief Complaint Chief Complaint  Patient presents with  . Jaw Pain    HPI Yvonne Buck is a 82 y.o. female with PMH of recuretn jaw dislocations. This is her 9th visit for the same. Patient was found by her Nursing home staff with her jaw stuck open , Similar to her previous presentations for dislocation. Brought in by EMS. Patient has a history of dementia and is unable to answer any questions secondary to her mandible dislocation and dementia.  HPI  Past Medical History:  Diagnosis Date  . Anxiety   . Asthma   . Chronic kidney disease   . Depression   . Fracture of greater trochanter of left femur ([place]) 04/02/2013  . Frequent falls   . Hip fracture, left ([place]) 04/02/2013  . Hyperlipidemia   . Hypertension   . Hypothyroidism   . Osteoporosis 06/23/2013  . Pacemaker   . Paroxysmal atrial fibrillation (HCC)    chads2vasc score is at least 6.  She is not a candidate for anticoagulation due to falls.  Her AF burden is low.  . Pelvic fracture ([place]) 06/23/2013  . Rhinitis, allergic 02/26/2014  . Senile osteoporosis 02/26/2014  . Sick sinus syndrome ([place])   . Stroke ([place])   . Tachycardia-bradycardia syndrome [facility])    s/p [employer] PPM implant in [place]  . TMJ (dislocation of temporomandibular joint) 06/16/2014  . Vitamin D deficiency     Patient Active Problem List   Diagnosis Date Noted  . Dysphagia 06/22/2015  . Dementia with behavioral disturbance 04/23/2015  . Vitamin D deficiency 04/23/2015  . Protein-calorie malnutrition ([place]) 04/23/2015  . Insomnia 12/28/2014  . Chronic diastolic congestive heart failure ([place]) 10/15/2014  . Hypertensive heart and renal disease 10/15/2014  . Frequent falls 08/07/2014  . Essential hypertension 08/07/2014  . Protein-calorie malnutrition, severe ([place]) 06/22/2014  . Bronchitis 06/21/2014  . Hypoxia 06/21/2014  . Weakness  06/17/2014  . Steroid-induced hyperglycemia 06/17/2014  . TMJ (dislocation of temporomandibular joint) 06/16/2014  . Acute respiratory failure with hypoxia ([place]) 06/16/2014  . Hypokalemia 06/16/2014  . Sick sinus syndrome ([place]) 03/24/2014  . Hyperlipidemia 02/26/2014  . Senile osteoporosis 02/26/2014  . Asthmatic bronchitis 02/26/2014  . Rhinitis, allergic 02/26/2014  . Essential hypertension, benign 07/07/2013  . Chronic asthma 06/16/2013  . Depression with anxiety 02/05/2013  . Gait instability 01/14/2013  . Left spastic hemiparesis ([place]) 11/16/2012  . Tachycardia-bradycardia syndrome ([place]) 01/06/2011  . Atrial fibrillation ([place]) 01/06/2011  . Hypothyroidism 12/10/2010    Past Surgical History:  Procedure Laterality Date  . CHOLECYSTECTOMY    . EP IMPLANTABLE DEVICE N/A 09/03/2015   Gen change with a St Jude Mediacl AssurityMRI DR pacemaker  . GALLBLADDER SURGERY    . PACEMAKER INSERTION  01/04/2005   [employer] [place] PPM 1290 [phone]), GDT [phone] atrial lead and 4457 V lead all implanted in [place]      OB History    No data available       Home Medications    Prior to Admission medications   Medication Sig Start Date End Date Taking? Authorizing Provider  acetaminophen (TYLENOL) 325 MG tablet Take 650 mg by mouth every 4 (four) hours as needed for moderate pain.    Historical Provider, MD  acetaminophen (TYLENOL) 500 MG tablet Take 1,000 mg by mouth 3 (three) times daily.  Historical Provider, MD  amLODipine (NORVASC) 5 MG tablet Take 5 mg by mouth daily.  08/21/14   Historical Provider, MD  aspirin EC 81 MG tablet Take 81 mg by mouth at bedtime. for AFIB    Historical Provider, MD  cholecalciferol (VITAMIN D) 1000 UNITS tablet Take 1,000 Units by mouth at bedtime. for vitamin D deficiency    Historical Provider, MD  Fluticasone-Salmeterol (ADVAIR) 250-50 MCG/DOSE AEPB Inhale 1 puff into the lungs 2 (two) times daily. for  SOB/Wheeze. Rinse mouth after use.    Historical Provider, MD  isosorbide mononitrate (IMDUR) 30 MG 24 hr tablet Take 30 mg by mouth every morning. for hypertension    Historical Provider, MD  losartan (COZAAR) 100 MG tablet Take 100 mg by mouth daily.     Historical Provider, MD  memantine (NAMENDA XR) 28 MG CP24 24 hr capsule Take 28 mg by mouth daily.    Historical Provider, MD  metoprolol (LOPRESSOR) 50 MG tablet Take 50 mg by mouth 2 (two) times daily.     Historical Provider, MD  mirtazapine (REMERON) 15 MG tablet Take 15 mg by mouth daily.    Historical Provider, MD  montelukast (SINGULAIR) 10 MG tablet Take 10 mg by mouth at bedtime. for allergies    Historical Provider, MD  OXYGEN Inhale 2 mLs into the lungs as needed (to keep sats above 90%).     Historical Provider, MD  potassium chloride (K-DUR,KLOR-CON) 10 MEQ tablet Take 10 mEq by mouth daily.    Historical Provider, MD  simvastatin (ZOCOR) 10 MG tablet Take 30 mg by mouth at bedtime.  04/25/15   Historical Provider, MD  UNABLE TO FIND Take 120 mLs by mouth 3 (three) times daily. Med Name: Med pass    Historical Provider, MD    Family History Family History  Problem Relation Age of Onset  . Diabetes Mother   . High blood pressure Mother   . Heart Problems Father     Social History Social History  Substance Use Topics  . Smoking status: Never Smoker  . Smokeless tobacco: Never Used  . Alcohol use No     Allergies   Penicillins   Review of Systems Review of Systems  Unable to review systems  Physical Exam Updated Vital Signs BP 111/64   Pulse 82   Temp 97 F (36.1 C) (Axillary)   Resp 16   SpO2 99%   Physical Exam  Constitutional: She is oriented to person, place, and time. She appears well-developed and well-nourished. No distress.  HENT:  Head: Normocephalic and atraumatic.  Mouth open wide, unable to speak or move mandible.  Eyes: Conjunctivae are normal. No scleral icterus.  Neck: Normal range of  motion.  Cardiovascular: Normal rate, regular rhythm and normal heart sounds.  Exam reveals no gallop and no friction rub.   No murmur heard. Pulmonary/Chest: Effort normal and breath sounds normal. No respiratory distress.  Abdominal: Soft. Bowel sounds are normal. She exhibits no distension and no mass. There is no tenderness. There is no guarding.  Neurological: She is alert and oriented to person, place, and time.  Skin: Skin is warm and dry. She is not diaphoretic.  Nursing note and vitals reviewed.    ED Treatments / Results  Labs (all labs ordered are listed, but only abnormal results are displayed) Labs Reviewed - No data to display  EKG  EKG Interpretation None       Radiology No results found.  Procedures Reduction of dislocation  Date/Time: 01/11/2016 11:12 AM Performed by: Margarita Mail Authorized by: Margarita Mail  Patient identity confirmed: provided demographic data Time out: Immediately prior to procedure a "time out" was called to verify the correct patient, procedure, equipment, support staff and site/side marked as required.  Sedation: Patient sedated: see procedure note by Dr. Roderic Palau. Patient tolerance: Patient tolerated the procedure well with no immediate complications Comments: Patient jaw easily reduced using the extra-oral reduction technique and placed in circular fixation bandage.    (including critical care time)  Medications Ordered in ED Medications  midazolam (VERSED) injection 2 mg (2 mg Intravenous Given 01/11/16 1100)  fentaNYL (SUBLIMAZE) injection 50 mcg (50 mcg Intravenous Given 01/11/16 1100)  flumazenil (ROMAZICON) injection 0.5 mg (0.5 mg Intravenous Given 01/11/16 1106)     Initial Impression / Assessment and Plan / ED Course  I have reviewed the triage vital signs and the nursing notes.  Pertinent labs & imaging results that were available during my care of the patient were reviewed by me and considered in my medical  decision making (see chart for details).  Clinical Course     Patient mandible reduced. She is alert and ready for discharge.  Final Clinical Impressions(s) / ED Diagnoses   Final diagnoses:  Closed dislocation of mandible, initial encounter    New Prescriptions New Prescriptions   No medications on file     Margarita Mail, PA-C 01/11/16 2000    M[place] Ferguson, MD 01/12/16 1505

## 2016-01-11 NOTE — Discharge Instructions (Signed)
Keep the wrapping on for the next 24 hours. Feed a liquid diet through a straw/.

## 2016-01-11 NOTE — ED Triage Notes (Signed)
Pt arrives from SNF with c/o dislocated jaw. Pt has chronic issues with jaw dislocation rt tmj. Received 53mcg of fentanyl pta.

## 2016-01-11 NOTE — ED Notes (Signed)
Pt is in stable condition upon d/c and will be escorted home via PTAR. Secretary to call PTAR to transport back to facility.

## 2016-01-14 ENCOUNTER — Emergency Department (HOSPITAL_COMMUNITY): Payer: Medicare Other

## 2016-01-14 ENCOUNTER — Emergency Department (HOSPITAL_COMMUNITY)
Admission: EM | Admit: 2016-01-14 | Discharge: 2016-01-14 | Disposition: A | Discharge status: Home / Self Care | Payer: Medicare Other | Attending: Emergency Medicine | Admitting: Emergency Medicine

## 2016-01-14 ENCOUNTER — Encounter (HOSPITAL_COMMUNITY): Payer: Self-pay | Admitting: *Deleted

## 2016-01-14 DIAGNOSIS — I130 Hypertensive heart and chronic kidney disease with heart failure and stage 1 through stage 4 chronic kidney disease, or unspecified chronic kidney disease: Secondary | ICD-10-CM | POA: Diagnosis not present

## 2016-01-14 DIAGNOSIS — N189 Chronic kidney disease, unspecified: Secondary | ICD-10-CM | POA: Insufficient documentation

## 2016-01-14 DIAGNOSIS — I5032 Chronic diastolic (congestive) heart failure: Secondary | ICD-10-CM | POA: Insufficient documentation

## 2016-01-14 DIAGNOSIS — M26639 Articular disc disorder of temporomandibular joint, unspecified side: Secondary | ICD-10-CM

## 2016-01-14 DIAGNOSIS — X58XXXD Exposure to other specified factors, subsequent encounter: Secondary | ICD-10-CM | POA: Insufficient documentation

## 2016-01-14 DIAGNOSIS — Z8673 Personal history of transient ischemic attack (TIA), and cerebral infarction without residual deficits: Secondary | ICD-10-CM | POA: Insufficient documentation

## 2016-01-14 DIAGNOSIS — Z950 Presence of cardiac pacemaker: Secondary | ICD-10-CM | POA: Insufficient documentation

## 2016-01-14 DIAGNOSIS — J45909 Unspecified asthma, uncomplicated: Secondary | ICD-10-CM | POA: Insufficient documentation

## 2016-01-14 DIAGNOSIS — E039 Hypothyroidism, unspecified: Secondary | ICD-10-CM | POA: Diagnosis not present

## 2016-01-14 DIAGNOSIS — S0300XD Dislocation of jaw, unspecified side, subsequent encounter: Secondary | ICD-10-CM | POA: Insufficient documentation

## 2016-01-14 DIAGNOSIS — Z7982 Long term (current) use of aspirin: Secondary | ICD-10-CM | POA: Diagnosis not present

## 2016-01-14 MED ORDER — MIDAZOLAM HCL 2 MG/2ML IJ SOLN
1.0000 mg | Freq: Once | INTRAMUSCULAR | Status: AC
  Administered 2016-01-14: 1 mg via INTRAVENOUS
  Filled 2016-01-14: qty 2

## 2016-01-14 MED ORDER — FENTANYL CITRATE (PF) 100 MCG/2ML IJ SOLN
50.0000 ug | Freq: Once | INTRAMUSCULAR | Status: AC
  Administered 2016-01-14: 50 ug via INTRAVENOUS
  Filled 2016-01-14: qty 2

## 2016-01-14 NOTE — ED Provider Notes (Signed)
[facility] DEPT Provider Note   CSN: [id number] Arrival date & time: 01/14/16  [id number]     History   Chief Complaint Chief Complaint  Patient presents with  . Jaw Pain  . Dislocation    HPI Yvonne Buck is a 82 y.o. female.  Patient presents by EMS with generalized dislocation. History of recurrent jaw dislocations and dementia. Multiple previous visits for the same. Patient with dementia unable to give any history. Her mouth is stuck open.    The history is provided by the patient and the EMS personnel. The history is limited by the condition of the patient.    Past Medical History:  Diagnosis Date  . Anxiety   . Asthma   . Chronic kidney disease   . Depression   . Fracture of greater trochanter of left femur ([place]) 04/02/2013  . Frequent falls   . Hip fracture, left ([place]) 04/02/2013  . Hyperlipidemia   . Hypertension   . Hypothyroidism   . Osteoporosis 06/23/2013  . Pacemaker   . Paroxysmal atrial fibrillation (HCC)    chads2vasc score is at least 6.  She is not a candidate for anticoagulation due to falls.  Her AF burden is low.  . Pelvic fracture ([place]) 06/23/2013  . Rhinitis, allergic 02/26/2014  . Senile osteoporosis 02/26/2014  . Sick sinus syndrome ([place])   . Stroke ([place])   . Tachycardia-bradycardia syndrome [facility])    s/p [employer] PPM implant in [place]  . TMJ (dislocation of temporomandibular joint) 06/16/2014  . Vitamin D deficiency     Patient Active Problem List   Diagnosis Date Noted  . Dysphagia 06/22/2015  . Dementia with behavioral disturbance 04/23/2015  . Vitamin D deficiency 04/23/2015  . Protein-calorie malnutrition ([place]) 04/23/2015  . Insomnia 12/28/2014  . Chronic diastolic congestive heart failure ([place]) 10/15/2014  . Hypertensive heart and renal disease 10/15/2014  . Frequent falls 08/07/2014  . Essential hypertension 08/07/2014  . Protein-calorie malnutrition, severe ([place]) 06/22/2014  . Bronchitis 06/21/2014  . Hypoxia 06/21/2014  .  Weakness 06/17/2014  . Steroid-induced hyperglycemia 06/17/2014  . TMJ (dislocation of temporomandibular joint) 06/16/2014  . Acute respiratory failure with hypoxia ([place]) 06/16/2014  . Hypokalemia 06/16/2014  . Sick sinus syndrome ([place]) 03/24/2014  . Hyperlipidemia 02/26/2014  . Senile osteoporosis 02/26/2014  . Asthmatic bronchitis 02/26/2014  . Rhinitis, allergic 02/26/2014  . Essential hypertension, benign 07/07/2013  . Chronic asthma 06/16/2013  . Depression with anxiety 02/05/2013  . Gait instability 01/14/2013  . Left spastic hemiparesis ([place]) 11/16/2012  . Tachycardia-bradycardia syndrome ([place]) 01/06/2011  . Atrial fibrillation ([place]) 01/06/2011  . Hypothyroidism 12/10/2010    Past Surgical History:  Procedure Laterality Date  . CHOLECYSTECTOMY    . EP IMPLANTABLE DEVICE N/A 09/03/2015   Gen change with a [employer]urity[employer] DR pacemaker  . GALLBLADDER SURGERY    . PACEMAKER INSERTION  01/04/2005   [employer] [place] PPM 1290 [phone]), GDT [phone] atrial lead and 4457 V lead all implanted in [place]      OB History    No data available       Home Medications    Prior to Admission medications   Medication Sig Start Date End Date Taking? Authorizing Provider  acetaminophen (TYLENOL) 325 MG tablet Take 650 mg by mouth every 4 (four) hours as needed for moderate pain.    Historical Provider, MD  amLODipine (NORVASC) 5 MG tablet Take 5 mg by mouth daily.  08/21/14  Historical Provider, MD  aspirin EC 81 MG tablet Take 81 mg by mouth at bedtime. for AFIB    Historical Provider, MD  cholecalciferol (VITAMIN D) 1000 UNITS tablet Take 1,000 Units by mouth at bedtime. for vitamin D deficiency    Historical Provider, MD  Fluticasone-Salmeterol (ADVAIR) 250-50 MCG/DOSE AEPB Inhale 1 puff into the lungs 2 (two) times daily. for SOB/Wheeze. Rinse mouth after use.    Historical Provider, MD  isosorbide mononitrate (IMDUR) 30 MG 24 hr  tablet Take 30 mg by mouth every morning. for hypertension    Historical Provider, MD  losartan (COZAAR) 100 MG tablet Take 100 mg by mouth daily.     Historical Provider, MD  memantine (NAMENDA XR) 28 MG CP24 24 hr capsule Take 28 mg by mouth daily.    Historical Provider, MD  metoprolol (LOPRESSOR) 50 MG tablet Take 50 mg by mouth 2 (two) times daily.     Historical Provider, MD  mirtazapine (REMERON) 15 MG tablet Take 15 mg by mouth daily.    Historical Provider, MD  montelukast (SINGULAIR) 10 MG tablet Take 10 mg by mouth at bedtime. for allergies    Historical Provider, MD  OXYGEN Inhale 2 mLs into the lungs as needed (to keep sats above 90%).     Historical Provider, MD  potassium chloride (K-DUR,KLOR-CON) 10 MEQ tablet Take 10 mEq by mouth daily.    Historical Provider, MD  simvastatin (ZOCOR) 10 MG tablet Take 30 mg by mouth at bedtime.  04/25/15   Historical Provider, MD  UNABLE TO FIND Take 120 mLs by mouth 3 (three) times daily. Med Name: Med pass    Historical Provider, MD    Family History Family History  Problem Relation Age of Onset  . Diabetes Mother   . High blood pressure Mother   . Heart Problems Father     Social History Social History  Substance Use Topics  . Smoking status: Never Smoker  . Smokeless tobacco: Never Used  . Alcohol use No     Allergies   Penicillins   Review of Systems Review of Systems  Unable to perform ROS: Dementia     Physical Exam Updated Vital Signs BP 178/64   Pulse (!) 59   Temp 97.9 F (36.6 C) (Axillary)   Resp 16   SpO2 94%   Physical Exam  Constitutional: She is oriented to person, place, and time. She appears well-developed and well-nourished. No distress.  HENT:  Head: Normocephalic and atraumatic.  Mouth/Throat: Oropharynx is clear and moist. No oropharyngeal exudate.  Dry mucus membranes, jaw stuck open.  Eyes: Conjunctivae and EOM are normal. Pupils are equal, round, and reactive to light.  Neck: Normal range  of motion. Neck supple.  No meningismus.  Cardiovascular: Normal rate, regular rhythm, normal heart sounds and intact distal pulses.   No murmur heard. Pulmonary/Chest: Effort normal and breath sounds normal. No respiratory distress.  Abdominal: Soft. There is no tenderness. There is no rebound and no guarding.  Musculoskeletal: Normal range of motion. She exhibits no edema or tenderness.  Neurological: She is alert and oriented to person, place, and time. No cranial nerve deficit. She exhibits normal muscle tone. Coordination normal.   5/5 strength throughout. CN 2-12 intact.Equal grip strength.   Skin: Skin is warm.  Psychiatric: She has a normal mood and affect. Her behavior is normal.  Nursing note and vitals reviewed.    ED Treatments / Results  Labs (all labs ordered are listed, but only  abnormal results are displayed) Labs Reviewed - No data to display  EKG  EKG Interpretation None       Radiology No results found.  Procedures Reduction of dislocation Date/Time: 01/14/2016 5:27 AM Performed by: Ezequiel Essex Authorized by: Ezequiel Essex  Consent: The procedure was performed in an emergent situation. Risks and benefits: risks, benefits and alternatives were discussed Time out: Immediately prior to procedure a "time out" was called to verify the correct patient, procedure, equipment, support staff and site/side marked as required.  Sedation: Patient sedated: yes Sedation type: anxiolysis Sedatives: midazolam Analgesia: fentanyl Sedation start date/time: 01/14/2016 5:15 AM Sedation end date/time: 01/14/2016 5:20 AM Vitals: Vital signs were monitored during sedation. Patient tolerance: Patient tolerated the procedure well with no immediate complications    (including critical care time)  Medications Ordered in ED Medications  fentaNYL (SUBLIMAZE) injection 50 mcg (50 mcg Intravenous Given 01/14/16 0501)  midazolam (VERSED) injection 1 mg (1 mg  Intravenous Given 01/14/16 0502)     Initial Impression / Assessment and Plan / ED Course  I have reviewed the triage vital signs and the nursing notes.  Pertinent labs & imaging results that were available during my care of the patient were reviewed by me and considered in my medical decision making (see chart for details).  Clinical Course   Recurrent jaw dislocations. Reduced on arrival with fentanyl and versed.  History of recurrent visits for same.  Xray confirms relocation without fractures. Wrap dressing placed.  Stable for discharge back to facility. Able to close mouth.  Final Clinical Impressions(s) / ED Diagnoses   Final diagnoses:  Closed dislocation of mandible, subsequent encounter    New Prescriptions New Prescriptions   No medications on file     Ezequiel Essex, MD 01/14/16 [id number]

## 2016-01-14 NOTE — ED Notes (Signed)
Patient transported to X-ray 

## 2016-01-14 NOTE — ED Triage Notes (Signed)
Pt from [facility] by EMS after dislocating jaw. Hx of tmj and dementia. EMS gave 61mcg enroute.

## 2016-01-14 NOTE — ED Notes (Signed)
Returned from xray

## 2016-01-14 NOTE — Discharge Instructions (Signed)
Follow-up with your doctor.  Return to the ED if you develop new or worsening symptoms. °

## 2016-01-14 NOTE — ED Notes (Signed)
Notified [facility] for transportation back home 

## 2016-01-18 ENCOUNTER — Non-Acute Institutional Stay (SKILLED_NURSING_FACILITY): Payer: Medicare Other | Admitting: Internal Medicine

## 2016-01-18 ENCOUNTER — Encounter: Payer: Self-pay | Admitting: Internal Medicine

## 2016-01-18 DIAGNOSIS — G308 Other Alzheimer's disease: Secondary | ICD-10-CM

## 2016-01-18 DIAGNOSIS — S0300XS Dislocation of jaw, unspecified side, sequela: Secondary | ICD-10-CM

## 2016-01-18 DIAGNOSIS — E784 Other hyperlipidemia: Secondary | ICD-10-CM | POA: Diagnosis not present

## 2016-01-18 DIAGNOSIS — I509 Heart failure, unspecified: Secondary | ICD-10-CM

## 2016-01-18 DIAGNOSIS — I110 Hypertensive heart disease with heart failure: Secondary | ICD-10-CM | POA: Diagnosis not present

## 2016-01-18 DIAGNOSIS — E7849 Other hyperlipidemia: Secondary | ICD-10-CM

## 2016-01-18 DIAGNOSIS — F0281 Dementia in other diseases classified elsewhere with behavioral disturbance: Secondary | ICD-10-CM

## 2016-01-18 NOTE — Progress Notes (Signed)
Patient ID: Yvonne Buck, female   DOB: 05-Jul-1929, 82 y.o.   MRN: [id number]      [facility]  Code Status: DNR  Chief Complaint  Patient presents with  . Medical Management of Chronic Issues    Routine Visit    Allergies  Allergen Reactions  . Penicillins Other (See Comments)    Red spots, ### Tolerated Rocephin 12/2012 ###, Has patient had a PCN reaction causing immediate rash, facial/tongue/throat swelling, SOB or lightheadedness with hypotension: Unknown Has patient had a PCN reaction causing severe rash involving mucus membranes or skin necrosis: Unknown Has patient had a PCN reaction that required hospitalization Unknown Has patient had a PCN reaction occurring within the last 10 years: Unknown If all of the above answers are "NO", then may proceed with Cephalosporin u   Advanced Directives 01/14/2016  Does Patient Have a Medical Advance Directive? No  Type of Advance Directive -  Does patient want to make changes to medical advance directive? -  Copy of [place] in Chart? -  Would patient like information on creating a medical advance directive? -  Pre-existing out of facility DNR order (yellow form or pink MOST form) -     HPI:   82 y/o female patient is seen today for routine visit. She was in the ED recently with her jaw dislocation. She underwent reduction of her jaw under sedation. She is supposed to wear her circular fixation bandage. She removes it. She has advanced dementia and does not participate in HPI and ROS today. Per nursing, she does express her needs at times. She needs some assistance with her meals. She is under total care otherwise.    Review of Systems: unable to obtain. No fall, pressure ulcer reported per nursing  Past Medical History:  Diagnosis Date  . Anxiety   . Asthma   . Chronic kidney disease   . Depression   . Fracture of greater trochanter of left femur ([place]) 04/02/2013  . Frequent falls   .  Hip fracture, left ([place]) 04/02/2013  . Hyperlipidemia   . Hypertension   . Hypothyroidism   . Osteoporosis 06/23/2013  . Pacemaker   . Paroxysmal atrial fibrillation (HCC)    chads2vasc score is at least 6.  She is not a candidate for anticoagulation due to falls.  Her AF burden is low.  . Pelvic fracture ([place]) 06/23/2013  . Rhinitis, allergic 02/26/2014  . Senile osteoporosis 02/26/2014  . Sick sinus syndrome ([place])   . Stroke ([place])   . Tachycardia-bradycardia syndrome [facility])    s/p [employer] PPM implant in [place]  . TMJ (dislocation of temporomandibular joint) 06/16/2014  . Vitamin D deficiency      Medication reviewed. See [facility]   Medication List       Accurate as of 01/18/16  2:18 PM. Always use your most recent med list.          acetaminophen 325 MG tablet Commonly known as:  TYLENOL Take 650 mg by mouth every 4 (four) hours as needed for moderate pain.   acetaminophen 500 MG tablet Commonly known as:  TYLENOL Take 1,000 mg by mouth 3 (three) times daily.   amLODipine 5 MG tablet Commonly known as:  NORVASC Take 5 mg by mouth daily.   aspirin EC 81 MG tablet Take 81 mg by mouth at bedtime. for AFIB   cholecalciferol 1000 units tablet Commonly known as:  VITAMIN D Take 1,000 Units by mouth at  bedtime. for vitamin D deficiency   Fluticasone-Salmeterol 250-50 MCG/DOSE Aepb Commonly known as:  ADVAIR Inhale 1 puff into the lungs 2 (two) times daily. for SOB/Wheeze. Rinse mouth after use.   HYDROcodone-acetaminophen 5-325 MG tablet Commonly known as:  NORCO/VICODIN Take 1 tablet by mouth every 12 (twelve) hours as needed for moderate pain.   isosorbide mononitrate 30 MG 24 hr tablet Commonly known as:  IMDUR Take 30 mg by mouth every morning. for hypertension   losartan 100 MG tablet Commonly known as:  COZAAR Take 100 mg by mouth daily.   metoprolol 50 MG tablet Commonly known as:  LOPRESSOR Take 50 mg by mouth 2 (two) times daily.   mirtazapine 15 MG  tablet Commonly known as:  REMERON Take 15 mg by mouth daily.   montelukast 10 MG tablet Commonly known as:  SINGULAIR Take 10 mg by mouth at bedtime. for allergies   NAMENDA XR 28 MG Cp24 24 hr capsule Generic drug:  memantine Take 28 mg by mouth daily.   OXYGEN Inhale 2 mLs into the lungs as needed (to keep sats above 90%).   potassium chloride 10 MEQ tablet Commonly known as:  K-DUR,KLOR-CON Take 10 mEq by mouth daily.   simvastatin 10 MG tablet Commonly known as:  ZOCOR Take 30 mg by mouth at bedtime.       Physical exam BP (!) 146/82   Pulse 80   Temp 98.7 F (37.1 C) (Oral)   Resp 20   Ht 5\' 5"  (1.651 m)   Wt 107 lb 6.4 oz (48.7 kg)   SpO2 96%   BMI 17.87 kg/m   Wt Readings from Last 3 Encounters:  01/18/16 107 lb 6.4 oz (48.7 kg)  12/07/15 104 lb (47.2 kg)  11/26/15 108 lb (49 kg)   Body mass index is 17.87 kg/m.  Constitutional: elderly, frail and thin built female HEENT: MMM, no cervical lymphadenopathy, able to open and close her mouth at present Eyes: PERRLA, EOMI Cardiovascular: Normal rate, regular rhythm and intact distal pulses.    Respiratory: CTAB  Musculoskeletal: able to move all 4 extremities with limited ROM, on geri chair, muscle wasting present Neurological: alert and oriented to self Skin: senile purpura to both arms   Labs CBC Latest Ref Rng & Units 11/05/2015 10/30/2015 09/03/2015  WBC 10:3/mL 8.9 8.6 8.1  Hemoglobin 12.0 - 16.0 g/dL 12.9 14.1 14.6  Hematocrit 36 - 46 % 41 45 46.2(H)  Platelets 150 - 399 K/L 298 277 280   CMP Latest Ref Rng & Units 11/05/2015 10/30/2015 09/03/2015  Glucose 65 - 99 mg/dL - - 97  BUN 4 - 21 mg/dL 18 15 11   Creatinine 0.5 - 1.1 mg/dL 0.6 0.5 0.51  Sodium 137 - 147 mmol/L 142 143 136  Potassium 3.4 - 5.3 mmol/L 4.2 4.0 3.4(L)  Chloride 101 - 111 mmol/L - - 102  CO2 22 - 32 mmol/L - - 28  Calcium 8.9 - 10.3 mg/dL - - 8.8(L)  Total Protein 6.5 - 8.1 g/dL - - -  Total Bilirubin 0.3 - 1.2 mg/dL - - -   Alkaline Phos 25 - 125 U/L 70 76 -  AST 13 - 35 U/L 15 13 -  ALT 7 - 35 U/L 22 18 -   Lipid Panel     Component Value Date/Time   CHOL 165 05/05/2015   TRIG 71 05/05/2015   HDL 49 05/05/2015   LDLCALC 102 05/05/2015   LDLDIRECT 83 12/10/2010 1018  Assessment/plan  Recurrent jaw dislocation S/p recent ED visit and reduction. Non compliant with wearing her support bandage and with her dementia this becomes difficult. Monitor clinically for now  Hypertensive heart disease with diastolic chf Currently on metoprolol 50 mg bid, imdur 30 mg daily and amlodipine 5 mg daily. Monitor bp reading weekly  Hyperlipidemia Continue simvastatin, check lipid panel Lipid Panel     Component Value Date/Time   CHOL 165 05/05/2015   TRIG 71 05/05/2015   HDL 49 05/05/2015   LDLCALC 102 05/05/2015   LDLDIRECT 83 12/10/2010 1018     Alzheimer's Dementia Continue namenda xr and monitor. Provide supportive care. Continue remeron to help with her mood    Blanchie Serve, MD Internal Medicine [place], [place] [address] Cell Phone (Monday-Friday 8 am - 5 pm): [phone] On Call: [phone] and follow prompts after 5 pm and on weekends Office Phone: [phone] Office Fax: [phone]

## 2016-01-19 LAB — LIPID PANEL
Cholesterol: 171 mg/dL (ref 0–200)
HDL: 55 mg/dL (ref 35–70)
LDL CALC: 91 mg/dL
Triglycerides: 124 mg/dL (ref 40–160)

## 2016-02-03 ENCOUNTER — Emergency Department (HOSPITAL_COMMUNITY)
Admission: EM | Admit: 2016-02-03 | Discharge: 2016-02-03 | Disposition: A | Discharge status: Other Acute Inpatient Hospital | Payer: Medicare Other | Attending: Emergency Medicine | Admitting: Emergency Medicine

## 2016-02-03 ENCOUNTER — Encounter (HOSPITAL_COMMUNITY): Payer: Self-pay

## 2016-02-03 ENCOUNTER — Emergency Department (HOSPITAL_COMMUNITY): Payer: Medicare Other

## 2016-02-03 DIAGNOSIS — Z7982 Long term (current) use of aspirin: Secondary | ICD-10-CM | POA: Insufficient documentation

## 2016-02-03 DIAGNOSIS — X58XXXA Exposure to other specified factors, initial encounter: Secondary | ICD-10-CM | POA: Diagnosis not present

## 2016-02-03 DIAGNOSIS — E039 Hypothyroidism, unspecified: Secondary | ICD-10-CM | POA: Insufficient documentation

## 2016-02-03 DIAGNOSIS — S0303XA Dislocation of jaw, bilateral, initial encounter: Secondary | ICD-10-CM | POA: Diagnosis not present

## 2016-02-03 DIAGNOSIS — S0993XA Unspecified injury of face, initial encounter: Secondary | ICD-10-CM | POA: Diagnosis present

## 2016-02-03 DIAGNOSIS — J45909 Unspecified asthma, uncomplicated: Secondary | ICD-10-CM | POA: Insufficient documentation

## 2016-02-03 DIAGNOSIS — S0300XA Dislocation of jaw, unspecified side, initial encounter: Secondary | ICD-10-CM

## 2016-02-03 DIAGNOSIS — Z8673 Personal history of transient ischemic attack (TIA), and cerebral infarction without residual deficits: Secondary | ICD-10-CM | POA: Diagnosis not present

## 2016-02-03 DIAGNOSIS — Z79899 Other long term (current) drug therapy: Secondary | ICD-10-CM | POA: Insufficient documentation

## 2016-02-03 DIAGNOSIS — Y939 Activity, unspecified: Secondary | ICD-10-CM | POA: Insufficient documentation

## 2016-02-03 DIAGNOSIS — Y929 Unspecified place or not applicable: Secondary | ICD-10-CM | POA: Diagnosis not present

## 2016-02-03 DIAGNOSIS — Y999 Unspecified external cause status: Secondary | ICD-10-CM | POA: Diagnosis not present

## 2016-02-03 DIAGNOSIS — Z950 Presence of cardiac pacemaker: Secondary | ICD-10-CM | POA: Insufficient documentation

## 2016-02-03 NOTE — ED Provider Notes (Signed)
[place] DEPT Provider Note   CSN: [id number] Arrival date & time: 02/03/16  [id number]     History   Chief Complaint Chief Complaint  Patient presents with  . Jaw Pain    HPI Yvonne Buck is a 82 y.o. female.  HPI    Level V caveat: jaw dislocation/nonverbal  Patient with history of frequent jaw dislocations requiring reduction in the ED, nonverbal at baseline presents with concern for jaw dislocation. Facility noted dislocation this AM and transferred her here for further evaluation.  History is limited by pt nonverbal at baseline, and inability to move her jaw.  Past Medical History:  Diagnosis Date  . Anxiety   . Asthma   . Chronic kidney disease   . Depression   . Fracture of greater trochanter of left femur ([place]) 04/02/2013  . Frequent falls   . Hip fracture, left ([place]) 04/02/2013  . Hyperlipidemia   . Hypertension   . Hypothyroidism   . Osteoporosis 06/23/2013  . Pacemaker   . Paroxysmal atrial fibrillation (HCC)    chads2vasc score is at least 6.  She is not a candidate for anticoagulation due to falls.  Her AF burden is low.  . Pelvic fracture ([place]) 06/23/2013  . Rhinitis, allergic 02/26/2014  . Senile osteoporosis 02/26/2014  . Sick sinus syndrome ([place])   . Stroke ([place])   . Tachycardia-bradycardia syndrome [facility])    s/p [employer] PPM implant in [place]  . TMJ (dislocation of temporomandibular joint) 06/16/2014  . Vitamin D deficiency     Patient Active Problem List   Diagnosis Date Noted  . Benign hypertensive heart disease with CHF (congestive heart failure) ([place]) 01/18/2016  . Dysphagia 06/22/2015  . Dementia with behavioral disturbance 04/23/2015  . Vitamin D deficiency 04/23/2015  . Protein-calorie malnutrition ([place]) 04/23/2015  . Insomnia 12/28/2014  . Chronic diastolic congestive heart failure ([place]) 10/15/2014  . Hypertensive heart and renal disease 10/15/2014  . Frequent falls 08/07/2014  . Essential hypertension 08/07/2014  . Protein-calorie  malnutrition, severe ([place]) 06/22/2014  . Bronchitis 06/21/2014  . Hypoxia 06/21/2014  . Weakness 06/17/2014  . Steroid-induced hyperglycemia 06/17/2014  . TMJ (dislocation of temporomandibular joint) 06/16/2014  . Acute respiratory failure with hypoxia ([place]) 06/16/2014  . Hypokalemia 06/16/2014  . Sick sinus syndrome ([place]) 03/24/2014  . Hyperlipidemia 02/26/2014  . Senile osteoporosis 02/26/2014  . Asthmatic bronchitis 02/26/2014  . Rhinitis, allergic 02/26/2014  . Essential hypertension, benign 07/07/2013  . Chronic asthma 06/16/2013  . Depression with anxiety 02/05/2013  . Gait instability 01/14/2013  . Left spastic hemiparesis ([place]) 11/16/2012  . Tachycardia-bradycardia syndrome ([place]) 01/06/2011  . Atrial fibrillation ([place]) 01/06/2011  . Hypothyroidism 12/10/2010    Past Surgical History:  Procedure Laterality Date  . CHOLECYSTECTOMY    . EP IMPLANTABLE DEVICE N/A 09/03/2015   Gen change with a St Jude Mediacl AssurityMRI DR pacemaker  . GALLBLADDER SURGERY    . PACEMAKER INSERTION  01/04/2005   [employer] [place] PPM [phone]), GDT [phone] atrial lead and 4457 V lead all implanted in [place]      OB History    No data available       Home Medications    Prior to Admission medications   Medication Sig Start Date End Date Taking? Authorizing Provider  amLODipine (NORVASC) 5 MG tablet Take 5 mg by mouth daily.  08/21/14  Yes Historical Provider, MD  Fluticasone-Salmeterol (ADVAIR) 250-50 MCG/DOSE AEPB Inhale 1 puff into the lungs 2 (  two) times daily. for SOB/Wheeze. Rinse mouth after use.   Yes Historical Provider, MD  HYDROcodone-acetaminophen (NORCO/VICODIN) 5-325 MG tablet Take 1 tablet by mouth every 12 (twelve) hours as needed for moderate pain.   Yes Historical Provider, MD  isosorbide mononitrate (IMDUR) 30 MG 24 hr tablet Take 30 mg by mouth every morning. for hypertension   Yes Historical Provider, MD  losartan (COZAAR)  100 MG tablet Take 100 mg by mouth daily.    Yes Historical Provider, MD  memantine (NAMENDA XR) 28 MG CP24 24 hr capsule Take 28 mg by mouth daily.   Yes Historical Provider, MD  metoprolol (LOPRESSOR) 50 MG tablet Take 50 mg by mouth 2 (two) times daily.    Yes Historical Provider, MD  mirtazapine (REMERON) 15 MG tablet Take 15 mg by mouth daily.   Yes Historical Provider, MD  montelukast (SINGULAIR) 10 MG tablet Take 10 mg by mouth at bedtime. for allergies   Yes Historical Provider, MD  potassium chloride (K-DUR,KLOR-CON) 10 MEQ tablet Take 10 mEq by mouth daily.   Yes Historical Provider, MD  simvastatin (ZOCOR) 10 MG tablet Take 30 mg by mouth at bedtime.  04/25/15  Yes Historical Provider, MD  acetaminophen (TYLENOL) 325 MG tablet Take 650 mg by mouth every 4 (four) hours as needed for moderate pain.    Historical Provider, MD  acetaminophen (TYLENOL) 500 MG tablet Take 1,000 mg by mouth 3 (three) times daily.    Historical Provider, MD  aspirin EC 81 MG tablet Take 81 mg by mouth at bedtime. for AFIB    Historical Provider, MD  cholecalciferol (VITAMIN D) 1000 UNITS tablet Take 1,000 Units by mouth at bedtime. for vitamin D deficiency    Historical Provider, MD  OXYGEN Inhale 2 mLs into the lungs as needed (to keep sats above 90%).     Historical Provider, MD    Family History Family History  Problem Relation Age of Onset  . Diabetes Mother   . High blood pressure Mother   . Heart Problems Father     Social History Social History  Substance Use Topics  . Smoking status: Never Smoker  . Smokeless tobacco: Never Used  . Alcohol use No     Allergies   Penicillins   Review of Systems Review of Systems  Unable to perform ROS: Dementia     Physical Exam Updated Vital Signs BP 178/72   Pulse 61   Temp 97.8 F (36.6 C)   Resp 18   SpO2 98%   Physical Exam  Constitutional: She is oriented to person, place, and time. She appears well-developed and well-nourished. No  distress.  HENT:  Head: Normocephalic and atraumatic.  Bilateral Jaw dislocation, mouth open, unable to close No signs of trauma  Eyes: Conjunctivae and EOM are normal.  Neck: Normal range of motion.  Cardiovascular: Normal rate, regular rhythm, normal heart sounds and intact distal pulses.  Exam reveals no gallop and no friction rub.   No murmur heard. Pulmonary/Chest: Effort normal and breath sounds normal. No respiratory distress. She has no wheezes. She has no rales.  Musculoskeletal: She exhibits no edema or tenderness.  Neurological: She is alert and oriented to person, place, and time.  Skin: Skin is warm and dry. No rash noted. She is not diaphoretic. No erythema.  Nursing note and vitals reviewed.    ED Treatments / Results  Labs (all labs ordered are listed, but only abnormal results are displayed) Labs Reviewed - No data to  display  EKG  EKG Interpretation None       Radiology Dg Mandible 1-3 Views  Result Date: 02/03/2016 CLINICAL DATA:  Chronic jaw dislocation EXAM: MANDIBLE - 1-3 VIEW COMPARISON:  01/14/2016 FINDINGS: No acute fracture is identified. Mandibular condyles appear well seated within the temporomandibular joint. No soft tissue abnormality is noted. No other focal abnormality is seen. IMPRESSION: No evidence of jaw dislocation Electronically Signed   By: Inez Catalina M.D.   On: 02/03/2016 10:51    Procedures Reduction of dislocation Date/Time: 02/03/2016 11:00 AM Performed by: Gareth Morgan Authorized by: Gareth Morgan  Consent: The procedure was performed in an emergent situation. Required items: required blood products, implants, devices, and special equipment available Patient identity confirmed: arm band Time out: Immediately prior to procedure a "time out" was called to verify the correct patient, procedure, equipment, support staff and site/side marked as required. Preparation: Patient was prepped and draped in the usual sterile  fashion. Local anesthesia used: no  Anesthesia: Local anesthesia used: no  Sedation: Patient sedated: no Patient tolerance: Patient tolerated the procedure well with no immediate complications Comments: Jaw dislocation, relocated with extra-oral technique with no complications    (including critical care time)  Medications Ordered in ED Medications - No data to display   Initial Impression / Assessment and Plan / ED Course  I have reviewed the triage vital signs and the nursing notes.  Pertinent labs & imaging results that were available during my care of the patient were reviewed by me and considered in my medical decision making (see chart for details).  Clinical Course    82 year old female with history of asthma, dementia, hypertension, hyperlipidemia, paroxysmal atrial fibrillation, frequent jaw dislocations, who presents with concern for jaw dislocation from her skilled nursing facility.  Patient with clinical jaw dislocation on exam. Extra-oral technique for jaw reduction used without sedation which was tolerated very well.  XR confirms reduction. Patient discharged in stable condition with understanding of reasons to return.   Final Clinical Impressions(s) / ED Diagnoses   Final diagnoses:  TMJ dislocation    New Prescriptions New Prescriptions   No medications on file     Gareth Morgan, MD 02/03/16 1104

## 2016-02-03 NOTE — ED Notes (Signed)
Bed: WA15 Expected date:  Expected time:  Means of arrival:  Comments: EMS-dislocated jaw 

## 2016-02-03 NOTE — ED Triage Notes (Addendum)
Per EMS, pt from [facility].  Pt has hx of chronic dislocated jaw.  Pt jaw is deviated.  EMS cannot say how it dislocated at this time.  Patient is nonverbal at baseline.  Vitals: 126/70, hr 66, resp 18, sats 97% ra

## 2016-02-03 NOTE — ED Notes (Signed)
Our EDP has reduced her mandible with minimal discomfort for pt. She is quite comfortable in appearance as I write this.

## 2016-02-03 NOTE — ED Notes (Signed)
I have just notified PTAR for tx back to her long-term care facility. She remains in no distress.

## 2016-02-19 LAB — CUP PACEART INCLINIC DEVICE CHECK
Battery Remaining Longevity: 138 mo
Battery Voltage: 3.05 V
Brady Statistic RA Percent Paced: 33 %
Brady Statistic RV Percent Paced: 0.06 %
Date Time Interrogation Session: 20171016144117
Implantable Lead Implant Date: 20061114
Implantable Lead Implant Date: 20061114
Implantable Lead Location: 753859
Implantable Lead Location: 753860
Implantable Lead Model: 4457
Implantable Lead Model: 4480
Implantable Lead Serial Number: 470623
Implantable Lead Serial Number: 520676
Implantable Pulse Generator Implant Date: 20170713
Lead Channel Impedance Value: 387.5 Ohm
Lead Channel Impedance Value: 487.5 Ohm
Lead Channel Pacing Threshold Amplitude: 0.5 V
Lead Channel Pacing Threshold Amplitude: 0.5 V
Lead Channel Pacing Threshold Pulse Width: 0.4 ms
Lead Channel Pacing Threshold Pulse Width: 0.4 ms
Lead Channel Sensing Intrinsic Amplitude: 12 mV
Lead Channel Sensing Intrinsic Amplitude: 3.5 mV
Lead Channel Setting Pacing Amplitude: 2 V
Lead Channel Setting Pacing Amplitude: 2.5 V
Lead Channel Setting Pacing Pulse Width: 0.4 ms
Lead Channel Setting Sensing Sensitivity: 2 mV
Pulse Gen Model: 2272
Pulse Gen Serial Number: 7923892

## 2016-02-20 ENCOUNTER — Emergency Department (HOSPITAL_COMMUNITY)
Admission: EM | Admit: 2016-02-20 | Discharge: 2016-02-20 | Disposition: A | Discharge status: Home / Self Care | Payer: Medicare Other | Attending: Emergency Medicine | Admitting: Emergency Medicine

## 2016-02-20 ENCOUNTER — Encounter (HOSPITAL_COMMUNITY): Payer: Self-pay | Admitting: Emergency Medicine

## 2016-02-20 DIAGNOSIS — E039 Hypothyroidism, unspecified: Secondary | ICD-10-CM | POA: Diagnosis not present

## 2016-02-20 DIAGNOSIS — Z79899 Other long term (current) drug therapy: Secondary | ICD-10-CM | POA: Diagnosis not present

## 2016-02-20 DIAGNOSIS — Y999 Unspecified external cause status: Secondary | ICD-10-CM | POA: Insufficient documentation

## 2016-02-20 DIAGNOSIS — Z8673 Personal history of transient ischemic attack (TIA), and cerebral infarction without residual deficits: Secondary | ICD-10-CM | POA: Insufficient documentation

## 2016-02-20 DIAGNOSIS — Z7982 Long term (current) use of aspirin: Secondary | ICD-10-CM | POA: Diagnosis not present

## 2016-02-20 DIAGNOSIS — I129 Hypertensive chronic kidney disease with stage 1 through stage 4 chronic kidney disease, or unspecified chronic kidney disease: Secondary | ICD-10-CM | POA: Insufficient documentation

## 2016-02-20 DIAGNOSIS — X58XXXA Exposure to other specified factors, initial encounter: Secondary | ICD-10-CM | POA: Diagnosis not present

## 2016-02-20 DIAGNOSIS — Y9289 Other specified places as the place of occurrence of the external cause: Secondary | ICD-10-CM | POA: Diagnosis not present

## 2016-02-20 DIAGNOSIS — S0993XA Unspecified injury of face, initial encounter: Secondary | ICD-10-CM | POA: Diagnosis present

## 2016-02-20 DIAGNOSIS — N189 Chronic kidney disease, unspecified: Secondary | ICD-10-CM | POA: Insufficient documentation

## 2016-02-20 DIAGNOSIS — Y939 Activity, unspecified: Secondary | ICD-10-CM | POA: Insufficient documentation

## 2016-02-20 DIAGNOSIS — Z950 Presence of cardiac pacemaker: Secondary | ICD-10-CM | POA: Insufficient documentation

## 2016-02-20 DIAGNOSIS — S0300XA Dislocation of jaw, unspecified side, initial encounter: Secondary | ICD-10-CM

## 2016-02-20 DIAGNOSIS — T40601A Poisoning by unspecified narcotics, accidental (unintentional), initial encounter: Secondary | ICD-10-CM

## 2016-02-20 DIAGNOSIS — S0301XA Dislocation of jaw, right side, initial encounter: Secondary | ICD-10-CM | POA: Diagnosis not present

## 2016-02-20 DIAGNOSIS — J45909 Unspecified asthma, uncomplicated: Secondary | ICD-10-CM | POA: Diagnosis not present

## 2016-02-20 LAB — BASIC METABOLIC PANEL
BUN: 15 mg/dL (ref 4–21)
CREATININE: 0.6 mg/dL (ref 0.5–1.1)
Glucose: 102 mg/dL
Potassium: 4.3 mmol/L (ref 3.4–5.3)
Sodium: 138 mmol/L (ref 137–147)

## 2016-02-20 LAB — CBC AND DIFFERENTIAL
HCT: 40 % (ref 36–46)
HEMOGLOBIN: 12.8 g/dL (ref 12.0–16.0)
Platelets: 301 10*3/uL (ref 150–399)
WBC: 8.8 10*3/mL

## 2016-02-20 NOTE — ED Provider Notes (Signed)
[place] DEPT Provider Note  CSN: [id number] Arrival date & time: 02/20/16  0306  By signing my name below, I, Dolores Hoose, attest that this documentation has been prepared under the direction and in the presence of Deno Etienne, DO . Electronically Signed: Dolores Hoose, Scribe. 02/20/2016. 3:34 AM.  History   Chief Complaint Chief Complaint  Patient presents with  . Drug Overdose   HPI   LEVEL 5 CAVEAT DUE TO PATIENT NONVERBAL HPI Comments:  Yvonne Buck is a 82 y.o. female brought in by ambulance, who presents to the Emergency Department with suspicions of drug overdose. Per [facility], Pt has not taken any medications in the last 48 hours. Pt was given narcan en route by EMS.    Past Medical History:  Diagnosis Date  . Anxiety   . Asthma   . Chronic kidney disease   . Depression   . Fracture of greater trochanter of left femur ([place]) 04/02/2013  . Frequent falls   . Hip fracture, left ([place]) 04/02/2013  . Hyperlipidemia   . Hypertension   . Hypothyroidism   . Osteoporosis 06/23/2013  . Pacemaker   . Paroxysmal atrial fibrillation (HCC)    chads2vasc score is at least 6.  She is not a candidate for anticoagulation due to falls.  Her AF burden is low.  . Pelvic fracture ([place]) 06/23/2013  . Rhinitis, allergic 02/26/2014  . Senile osteoporosis 02/26/2014  . Sick sinus syndrome ([place])   . Stroke ([place])   . Tachycardia-bradycardia syndrome [facility])    s/p [employer] PPM implant in [place]  . TMJ (dislocation of temporomandibular joint) 06/16/2014  . Vitamin D deficiency     Patient Active Problem List   Diagnosis Date Noted  . Benign hypertensive heart disease with CHF (congestive heart failure) ([place]) 01/18/2016  . Dysphagia 06/22/2015  . Dementia with behavioral disturbance 04/23/2015  . Vitamin D deficiency 04/23/2015  . Protein-calorie malnutrition ([place]) 04/23/2015  . Insomnia 12/28/2014  . Chronic diastolic congestive heart failure ([place]) 10/15/2014  .  Hypertensive heart and renal disease 10/15/2014  . Frequent falls 08/07/2014  . Essential hypertension 08/07/2014  . Protein-calorie malnutrition, severe ([place]) 06/22/2014  . Bronchitis 06/21/2014  . Hypoxia 06/21/2014  . Weakness 06/17/2014  . Steroid-induced hyperglycemia 06/17/2014  . TMJ (dislocation of temporomandibular joint) 06/16/2014  . Acute respiratory failure with hypoxia ([place]) 06/16/2014  . Hypokalemia 06/16/2014  . Sick sinus syndrome ([place]) 03/24/2014  . Hyperlipidemia 02/26/2014  . Senile osteoporosis 02/26/2014  . Asthmatic bronchitis 02/26/2014  . Rhinitis, allergic 02/26/2014  . Essential hypertension, benign 07/07/2013  . Chronic asthma 06/16/2013  . Depression with anxiety 02/05/2013  . Gait instability 01/14/2013  . Left spastic hemiparesis ([place]) 11/16/2012  . Tachycardia-bradycardia syndrome ([place]) 01/06/2011  . Atrial fibrillation ([place]) 01/06/2011  . Hypothyroidism 12/10/2010    Past Surgical History:  Procedure Laterality Date  . CHOLECYSTECTOMY    . EP IMPLANTABLE DEVICE N/A 09/03/2015   Gen change with a St Jude Mediacl AssurityMRI DR pacemaker  . GALLBLADDER SURGERY    . PACEMAKER INSERTION  01/04/2005   [employer] [place] PPM 1290 [phone]), GDT [phone] atrial lead and 4457 V lead all implanted in [place]      OB History    No data available       Home Medications    Prior to Admission medications   Medication Sig Start Date End Date Taking? Authorizing Provider  acetaminophen (TYLENOL) 325 MG tablet Take 650  mg by mouth every 4 (four) hours as needed for moderate pain.    Historical Provider, MD  acetaminophen (TYLENOL) 500 MG tablet Take 1,000 mg by mouth 3 (three) times daily.    Historical Provider, MD  amLODipine (NORVASC) 5 MG tablet Take 5 mg by mouth daily.  08/21/14   Historical Provider, MD  aspirin EC 81 MG tablet Take 81 mg by mouth at bedtime. for AFIB    Historical Provider, MD    cholecalciferol (VITAMIN D) 1000 UNITS tablet Take 1,000 Units by mouth at bedtime. for vitamin D deficiency    Historical Provider, MD  Fluticasone-Salmeterol (ADVAIR) 250-50 MCG/DOSE AEPB Inhale 1 puff into the lungs 2 (two) times daily. for SOB/Wheeze. Rinse mouth after use.    Historical Provider, MD  HYDROcodone-acetaminophen (NORCO/VICODIN) 5-325 MG tablet Take 1 tablet by mouth every 12 (twelve) hours as needed for moderate pain.    Historical Provider, MD  isosorbide mononitrate (IMDUR) 30 MG 24 hr tablet Take 30 mg by mouth every morning. for hypertension    Historical Provider, MD  losartan (COZAAR) 100 MG tablet Take 100 mg by mouth daily.     Historical Provider, MD  memantine (NAMENDA XR) 28 MG CP24 24 hr capsule Take 28 mg by mouth daily.    Historical Provider, MD  metoprolol (LOPRESSOR) 50 MG tablet Take 50 mg by mouth 2 (two) times daily.     Historical Provider, MD  mirtazapine (REMERON) 15 MG tablet Take 15 mg by mouth daily.    Historical Provider, MD  montelukast (SINGULAIR) 10 MG tablet Take 10 mg by mouth at bedtime. for allergies    Historical Provider, MD  OXYGEN Inhale 2 mLs into the lungs as needed (to keep sats above 90%).     Historical Provider, MD  potassium chloride (K-DUR,KLOR-CON) 10 MEQ tablet Take 10 mEq by mouth daily.    Historical Provider, MD  simvastatin (ZOCOR) 10 MG tablet Take 30 mg by mouth at bedtime.  04/25/15   Historical Provider, MD    Family History Family History  Problem Relation Age of Onset  . Diabetes Mother   . High blood pressure Mother   . Heart Problems Father     Social History Social History  Substance Use Topics  . Smoking status: Never Smoker  . Smokeless tobacco: Never Used  . Alcohol use No     Allergies   Penicillins   Review of Systems Review of Systems  Unable to perform ROS: Patient nonverbal     Physical Exam Updated Vital Signs BP 114/58   Pulse (!) 59   Resp 25   Ht 5\' 8"  (1.727 m)   Wt 115 lb  (52.2 kg)   SpO2 94%   BMI 17.49 kg/m   Physical Exam  Constitutional: She is oriented to person, place, and time. She appears well-developed and well-nourished. No distress.  HENT:  Head: Normocephalic and atraumatic.  Anterior displaced right mandible, unable to close mouth  Eyes: EOM are normal. Pupils are equal, round, and reactive to light.  Pupils 2cm  Neck: Normal range of motion. Neck supple.  Cardiovascular: Normal rate and regular rhythm.  Exam reveals no gallop and no friction rub.   No murmur heard. Pulmonary/Chest: Effort normal. She has no wheezes. She has no rales.  Abdominal: Soft. She exhibits no distension. There is no tenderness.  Musculoskeletal: She exhibits no edema or tenderness.  Neurological: She is alert and oriented to person, place, and time.  Skin: Skin is  warm and dry. She is not diaphoretic.  Psychiatric: She has a normal mood and affect. Her behavior is normal.  Nursing note and vitals reviewed.    ED Treatments / Results  DIAGNOSTIC STUDIES:  Oxygen Saturation is 97% on RA, normal by my interpretation.    COORDINATION OF CARE:  4:08 AM Discussed treatment plan with pt at bedside who is nonverbal.   Labs (all labs ordered are listed, but only abnormal results are displayed) Labs Reviewed - No data to display  EKG  EKG Interpretation None       Radiology No results found.  Procedures Reduction of dislocation Date/Time: 02/20/2016 5:56 AM Performed by: Tyrone Nine, Jamine Highfill Authorized by: Tyrone Nine, Missy Baksh  Risks and benefits: risks, benefits and alternatives were discussed Consent given by: patient Patient identity confirmed: verbally with patient Local anesthesia used: no  Anesthesia: Local anesthesia used: no  Sedation: Patient sedated: no Patient tolerance: Patient tolerated the procedure well with no immediate complications Comments: Extraoral technique     (including critical care time)  Medications Ordered in ED Medications -  No data to display   Initial Impression / Assessment and Plan / ED Course  I have reviewed the triage vital signs and the nursing notes.  Pertinent labs & imaging results that were available during my care of the patient were reviewed by me and considered in my medical decision making (see chart for details).  Clinical Course     82 yo F with ams.  Likely due to overused narcotics.  At baseline on my eval post narcan.  Jaw reduced at bedside.  D/c home.    I have discussed the diagnosis/risks/treatment options with the patient and family and believe the pt to be eligible for discharge home to follow-up with PCP. We also discussed returning to the ED immediately if new or worsening sx occur. We discussed the sx which are most concerning (e.g., sudden worsening pain, fever, inability to tolerate by mouth) that necessitate immediate return. Medications administered to the patient during their visit and any new prescriptions provided to the patient are listed below.  Medications given during this visit Medications - No data to display   The patient appears reasonably screen and/or stabilized for discharge and I doubt any other medical condition or other [facility] requiring further screening, evaluation, or treatment in the ED at this time prior to discharge.    Final Clinical Impressions(s) / ED Diagnoses   Final diagnoses:  Opiate overdose, accidental or unintentional, initial encounter  Jaw dislocation, initial encounter    New Prescriptions Discharge Medication List as of 02/20/2016  4:11 AM     I personally performed the services described in this documentation, which was scribed in my presence. The recorded information has been reviewed and is accurate.     Deno Etienne, DO 02/20/16 [id number]

## 2016-02-20 NOTE — ED Triage Notes (Signed)
Per [facility]    Pt coming [facility]. Facility states that she has not received any medications in the last 48 hrs. 2 mg narcan total given. Pt completely unresponsive. Minimal pain response. Respiratory shallow and decreased. Pinpoint pupils. hypotensive 0.5 mg of narcan given some pain response noted 0.5 mg narcan given pt stated flexing hand and feet to pain response. Another 1 mg administered pt more aroused and tracking with her eyes.  Atrial pacemaker.  [place]

## 2016-02-23 ENCOUNTER — Non-Acute Institutional Stay (SKILLED_NURSING_FACILITY): Payer: Medicare Other | Admitting: Family

## 2016-02-23 DIAGNOSIS — R05 Cough: Secondary | ICD-10-CM

## 2016-02-23 DIAGNOSIS — R059 Cough, unspecified: Secondary | ICD-10-CM

## 2016-02-23 NOTE — Progress Notes (Signed)
Location:  [facility] Room Number: [place]:  SNF (31) Provider: Dinah Ngetich FNP-C   Blanchie Serve, MD  Patient Care Team: Blanchie Serve, MD as PCP - General (Internal Medicine) Gerlene Fee, NP as Nurse Practitioner (Nurse Practitioner)  Extended Emergency Contact Information Primary Emergency Contact: [facility] Address: [address] [place], [place] [address] [place] Phone: [phone] Mobile Phone: [phone] Relation: Daughter Secondary Emergency Contact: Rodriguez,Jorge Address: [address]          [place], [place] Mobile Phone: [phone] Relation: Yolanda Bonine  Code Status:  DNR  Goals of care: Advanced Directive information Advanced Directives 02/03/2016  Does Patient Have a Medical Advance Directive? No  Type of Advance Directive -  Does patient want to make changes to medical advance directive? -  Copy of [place] in Chart? -  Would patient like information on creating a medical advance directive? No - Patient declined  Pre-existing out of facility DNR order (yellow form or pink MOST form) -     Chief Complaint  Patient presents with  . Acute Visit    cough and congestion     HPI:  Pt is a 83 y.o. female seen today at [facility] for an acute visit for  Evaluation of cough. She has a medical history of HTN, CHF, Sick sinus syndrome, Afib, Dementia with behavioral disturbance, TMJ, Depression among other conditions. She is seen in her room today with daughter at bedside per Hospice Nurse request. Hospice Nurse reports patient here to admit patient to hospice service due to decline in condition. Also states patient has a productive cough and daughter request X-ray. Patient unable to provide HPI and ROS due to dementia.    Past Medical History:  Diagnosis Date  . Anxiety   . Asthma   .  Chronic kidney disease   . Depression   . Fracture of greater trochanter of left femur ([place]) 04/02/2013  . Frequent falls   . Hip fracture, left ([place]) 04/02/2013  . Hyperlipidemia   . Hypertension   . Hypothyroidism   . Osteoporosis 06/23/2013  . Pacemaker   . Paroxysmal atrial fibrillation (HCC)    chads2vasc score is at least 6.  She is not a candidate for anticoagulation due to falls.  Her AF burden is low.  . Pelvic fracture ([place]) 06/23/2013  . Rhinitis, allergic 02/26/2014  . Senile osteoporosis 02/26/2014  . Sick sinus syndrome ([place])   . Stroke ([place])   . Tachycardia-bradycardia syndrome [facility])    s/p [employer] PPM implant in [place]  . TMJ (dislocation of temporomandibular joint) 06/16/2014  . Vitamin D deficiency    Past Surgical History:  Procedure Laterality Date  . CHOLECYSTECTOMY    . EP IMPLANTABLE DEVICE N/A 09/03/2015   Gen change with a St Jude Mediacl AssurityMRI DR pacemaker  . GALLBLADDER SURGERY    . PACEMAKER INSERTION  01/04/2005   [employer] Insignia PPM 1290 [phone]), GDT [id number] atrial lead and 4457 V lead all implanted in [place]  . TONSILLECTOMY AND ADENOIDECTOMY      Allergies  Allergen Reactions  . Penicillins Other (See Comments)    Red spots, ### Tolerated Rocephin 12/2012 ###, Has patient had a PCN reaction causing immediate rash, facial/tongue/throat swelling, SOB or lightheadedness with hypotension: Unknown Has patient had a PCN reaction  causing severe rash involving mucus membranes or skin necrosis: Unknown Has patient had a PCN reaction that required hospitalization Unknown Has patient had a PCN reaction occurring within the last 10 years: Unknown If all of the above answers are "NO", then may proceed with Cephalosporin u    Allergies as of 02/23/2016      Reactions   Penicillins Other (See Comments)   Red spots, ### Tolerated Rocephin 12/2012 ###, Has patient had a PCN reaction causing immediate rash, facial/tongue/throat swelling, SOB or  lightheadedness with hypotension: Unknown Has patient had a PCN reaction causing severe rash involving mucus membranes or skin necrosis: Unknown Has patient had a PCN reaction that required hospitalization Unknown Has patient had a PCN reaction occurring within the last 10 years: Unknown If all of the above answers are "NO", then may proceed with Cephalosporin u      Medication List       Accurate as of 02/23/16  2:15 PM. Always use your most recent med list.          acetaminophen 325 MG tablet Commonly known as:  TYLENOL Take 650 mg by mouth every 4 (four) hours as needed for moderate pain.   acetaminophen 500 MG tablet Commonly known as:  TYLENOL Take 1,000 mg by mouth 3 (three) times daily.   amLODipine 5 MG tablet Commonly known as:  NORVASC Take 5 mg by mouth daily.   aspirin EC 81 MG tablet Take 81 mg by mouth at bedtime. for AFIB   cholecalciferol 1000 units tablet Commonly known as:  VITAMIN D Take 1,000 Units by mouth at bedtime. for vitamin D deficiency   Fluticasone-Salmeterol 250-50 MCG/DOSE Aepb Commonly known as:  ADVAIR Inhale 1 puff into the lungs 2 (two) times daily. for SOB/Wheeze. Rinse mouth after use.   HYDROcodone-acetaminophen 5-325 MG tablet Commonly known as:  NORCO/VICODIN Take 1 tablet by mouth every 12 (twelve) hours as needed for moderate pain.   isosorbide mononitrate 30 MG 24 hr tablet Commonly known as:  IMDUR Take 30 mg by mouth every morning. for hypertension   losartan 100 MG tablet Commonly known as:  COZAAR Take 100 mg by mouth daily.   metoprolol 50 MG tablet Commonly known as:  LOPRESSOR Take 50 mg by mouth 2 (two) times daily.   mirtazapine 15 MG tablet Commonly known as:  REMERON Take 15 mg by mouth daily.   montelukast 10 MG tablet Commonly known as:  SINGULAIR Take 10 mg by mouth at bedtime. for allergies   NAMENDA XR 28 MG Cp24 24 hr capsule Generic drug:  memantine Take 28 mg by mouth daily.   OXYGEN Inhale  2 mLs into the lungs as needed (to keep sats above 90%).   potassium chloride 10 MEQ tablet Commonly known as:  K-DUR,KLOR-CON Take 10 mEq by mouth daily.   simvastatin 10 MG tablet Commonly known as:  ZOCOR Take 30 mg by mouth at bedtime.       Review of Systems  Unable to perform ROS: Dementia    Immunization History  Administered Date(s) Administered  . Influenza-Unspecified 11/28/2013, 12/03/2014  . PPD Test 01/03/2013, 01/18/2013, 04/19/2013, 06/25/2014, 10/29/2014  . Pneumococcal-Unspecified 02/01/2013   Pertinent  Health Maintenance Due  Topic Date Due  . DEXA SCAN  10/04/1996  . PNA vac Low Risk Adult (2 of 2 - PCV13) 02/01/2014  . INFLUENZA VACCINE  02/21/2017 (Originally 09/22/2015)   Fall Risk  12/10/2010  Risk for fall due to : History of fall(s);Impaired balance/gait  Functional Status Survey:    Vitals:   02/23/16 1100  BP: (!) 90/56  Pulse: 61  Resp: (!) 24  Temp: 97.4 F (36.3 C)  SpO2: 97%  Weight: 106 lb 12.8 oz (48.4 kg)  Height: 5\' 8"  (1.727 m)   Body mass index is 16.24 kg/m. Physical Exam  Constitutional: She appears well-developed and well-nourished. No distress.  Responded verbally to daughter but follows provider with eyes  during the visit.   HENT:  Head: Normocephalic.  Mouth/Throat: Oropharynx is clear and moist. No oropharyngeal exudate.  Eyes: Conjunctivae and EOM are normal. Pupils are equal, round, and reactive to light. Right eye exhibits no discharge. Left eye exhibits no discharge. No scleral icterus.  Neck: Neck supple. No JVD present.  Cardiovascular: Normal rate, regular rhythm, normal heart sounds and intact distal pulses.  Exam reveals no gallop and no friction rub.   No murmur heard. Pulmonary/Chest: Effort normal. No respiratory distress. She has no wheezes.  Right upper lobe rales noted on auscultation   Abdominal: Soft. Bowel sounds are normal. She exhibits no distension. There is no tenderness. There is no rebound  and no guarding.  Genitourinary:  Genitourinary Comments: Incontinent   Musculoskeletal: She exhibits no edema, tenderness or deformity.  Moves x 4 extremities.   Lymphadenopathy:    She has no cervical adenopathy.  Neurological: She is alert.  Good eye contact   Skin: Skin is warm and dry. No rash noted. No erythema. No pallor.  Psychiatric: She has a normal mood and affect.    Labs reviewed:  Recent Labs  05/23/15 1441  09/03/15 1230 09/03/15 1948 10/30/15 11/05/15  NA 140  < > 141 136 143 142  K 4.8  < > 3.6 3.4* 4.0 4.2  CL 103  --  102 102  --   --   CO2 30  --  30 28  --   --   GLUCOSE 116*  --  112* 97  --   --   BUN 20  < > 15 11 15 18   CREATININE 0.75  < > 0.58 0.51 0.5 0.6  CALCIUM 9.4  --  9.6 8.8*  --   --   < > = values in this interval not displayed.  Recent Labs  05/23/15 1441 06/02/15 10/30/15 11/05/15  AST 21 14 13 15   ALT 29 13 18 22   ALKPHOS 77 75 76 70  BILITOT 1.0  --   --   --   PROT 6.5  --   --   --   ALBUMIN 3.1*  --   --   --     Recent Labs  05/23/15 1441  09/03/15 1230 09/03/15 1948 10/30/15 11/05/15  WBC 8.7  < > 9.5 8.1 8.6 8.9  NEUTROABS 4.5  --   --  4.5  --   --   HGB 15.1*  < > 16.5* 14.6 14.1 12.9  HCT 46.7*  < > 52.9* 46.2* 45 41  MCV 89.6  --  91.4 89.9  --   --   PLT 322  < > 292 280 277 298  < > = values in this interval not displayed. Lab Results  Component Value Date   TSH 0.57 05/05/2015   Lab Results  Component Value Date   HGBA1C 6.1 03/31/2015   Lab Results  Component Value Date   CHOL 165 05/05/2015   HDL 49 05/05/2015   LDLCALC 102 05/05/2015   LDLDIRECT 83 12/10/2010   TRIG  71 05/05/2015   Assessment/Plan  Productive Cough Afebrile. Coughing up thick yellow greenish mucus. Rales to right upper lobe noted on exam. Continue on cough syrup as needed. Stat CXR Pa/Lat rule out PNA per patient's daughter request. Continue on Hospice services.      Family/ staff Communication: Reviewed plan of care  with patient, Patient's daughter and facility Nurse supervisor.   Labs/tests ordered:   Stat CXR Pa/Lat rule out PNA

## 2016-03-05 ENCOUNTER — Emergency Department (HOSPITAL_COMMUNITY)
Admission: EM | Admit: 2016-03-05 | Discharge: 2016-03-05 | Disposition: A | Discharge status: Home / Self Care | Attending: Emergency Medicine | Admitting: Emergency Medicine

## 2016-03-05 ENCOUNTER — Encounter (HOSPITAL_COMMUNITY): Payer: Self-pay

## 2016-03-05 DIAGNOSIS — Y999 Unspecified external cause status: Secondary | ICD-10-CM | POA: Insufficient documentation

## 2016-03-05 DIAGNOSIS — Y939 Activity, unspecified: Secondary | ICD-10-CM | POA: Insufficient documentation

## 2016-03-05 DIAGNOSIS — Z7982 Long term (current) use of aspirin: Secondary | ICD-10-CM | POA: Diagnosis not present

## 2016-03-05 DIAGNOSIS — S0300XA Dislocation of jaw, unspecified side, initial encounter: Secondary | ICD-10-CM | POA: Diagnosis present

## 2016-03-05 DIAGNOSIS — X58XXXA Exposure to other specified factors, initial encounter: Secondary | ICD-10-CM | POA: Diagnosis not present

## 2016-03-05 DIAGNOSIS — N189 Chronic kidney disease, unspecified: Secondary | ICD-10-CM | POA: Insufficient documentation

## 2016-03-05 DIAGNOSIS — E039 Hypothyroidism, unspecified: Secondary | ICD-10-CM | POA: Diagnosis not present

## 2016-03-05 DIAGNOSIS — Z8673 Personal history of transient ischemic attack (TIA), and cerebral infarction without residual deficits: Secondary | ICD-10-CM | POA: Insufficient documentation

## 2016-03-05 DIAGNOSIS — I5032 Chronic diastolic (congestive) heart failure: Secondary | ICD-10-CM | POA: Insufficient documentation

## 2016-03-05 DIAGNOSIS — I130 Hypertensive heart and chronic kidney disease with heart failure and stage 1 through stage 4 chronic kidney disease, or unspecified chronic kidney disease: Secondary | ICD-10-CM | POA: Insufficient documentation

## 2016-03-05 DIAGNOSIS — Z950 Presence of cardiac pacemaker: Secondary | ICD-10-CM | POA: Insufficient documentation

## 2016-03-05 DIAGNOSIS — Y929 Unspecified place or not applicable: Secondary | ICD-10-CM | POA: Diagnosis not present

## 2016-03-05 DIAGNOSIS — J45909 Unspecified asthma, uncomplicated: Secondary | ICD-10-CM | POA: Diagnosis not present

## 2016-03-05 NOTE — ED Provider Notes (Signed)
[facility] DEPT Provider Note   CSN: [id number] Arrival date & time: 03/05/16  [id number]     History   Chief Complaint Chief Complaint  Patient presents with  . Jaw Pain    HPI Yvonne Buck is a 83 y.o. female.  HPI Patient presents from nursing facility with concern of jaw pain and dislocation. Patient has multiple medical problems is on hospice care, has known chronic TMJ, multiple prior jaw dislocations. Patient is nonverbal, level V caveat. Per report this occurred earlier today, though the exact onset is unclear. Review of the patient's written nursing home records demonstrates that the patient is supposed to wear continuous jaw strap to prevent dislocation. It is unclear if this was occurring.  Past Medical History:  Diagnosis Date  . Anxiety   . Asthma   . Chronic kidney disease   . Depression   . Fracture of greater trochanter of left femur ([place]) 04/02/2013  . Frequent falls   . Hip fracture, left ([place]) 04/02/2013  . Hyperlipidemia   . Hypertension   . Hypothyroidism   . Osteoporosis 06/23/2013  . Pacemaker   . Paroxysmal atrial fibrillation (HCC)    chads2vasc score is at least 6.  She is not a candidate for anticoagulation due to falls.  Her AF burden is low.  . Pelvic fracture ([place]) 06/23/2013  . Rhinitis, allergic 02/26/2014  . Senile osteoporosis 02/26/2014  . Sick sinus syndrome ([place])   . Stroke ([place])   . Tachycardia-bradycardia syndrome [facility])    s/p [employer] PPM implant in [place]  . TMJ (dislocation of temporomandibular joint) 06/16/2014  . Vitamin D deficiency     Patient Active Problem List   Diagnosis Date Noted  . Benign hypertensive heart disease with CHF (congestive heart failure) ([place]) 01/18/2016  . Dysphagia 06/22/2015  . Dementia with behavioral disturbance 04/23/2015  . Vitamin D deficiency 04/23/2015  . Protein-calorie malnutrition ([place]) 04/23/2015  . Insomnia 12/28/2014  . Chronic diastolic congestive heart failure ([place]) 10/15/2014    . Hypertensive heart and renal disease 10/15/2014  . Frequent falls 08/07/2014  . Essential hypertension 08/07/2014  . Protein-calorie malnutrition, severe ([place]) 06/22/2014  . Bronchitis 06/21/2014  . Hypoxia 06/21/2014  . Weakness 06/17/2014  . Steroid-induced hyperglycemia 06/17/2014  . TMJ (dislocation of temporomandibular joint) 06/16/2014  . Acute respiratory failure with hypoxia ([place]) 06/16/2014  . Hypokalemia 06/16/2014  . Sick sinus syndrome ([place]) 03/24/2014  . Hyperlipidemia 02/26/2014  . Senile osteoporosis 02/26/2014  . Asthmatic bronchitis 02/26/2014  . Rhinitis, allergic 02/26/2014  . Essential hypertension, benign 07/07/2013  . Chronic asthma 06/16/2013  . Depression with anxiety 02/05/2013  . Gait instability 01/14/2013  . Left spastic hemiparesis ([place]) 11/16/2012  . Tachycardia-bradycardia syndrome ([place]) 01/06/2011  . Atrial fibrillation ([place]) 01/06/2011  . Hypothyroidism 12/10/2010    Past Surgical History:  Procedure Laterality Date  . CHOLECYSTECTOMY    . EP IMPLANTABLE DEVICE N/A 09/03/2015   Gen change with a St Jude Mediacl AssurityMRI DR pacemaker  . GALLBLADDER SURGERY    . PACEMAKER INSERTION  01/04/2005   [employer] [place] PPM 1290 [phone]), GDT [phone] atrial lead and 4457 V lead all implanted in [place]      OB History    No data available       Home Medications    Prior to Admission medications   Medication Sig Start Date End Date Taking? Authorizing Provider  acetaminophen (TYLENOL) 325 MG tablet Take 650 mg by mouth every  4 (four) hours as needed for moderate pain.    Historical Provider, MD  acetaminophen (TYLENOL) 500 MG tablet Take 1,000 mg by mouth 3 (three) times daily.    Historical Provider, MD  amLODipine (NORVASC) 5 MG tablet Take 5 mg by mouth daily.  08/21/14   Historical Provider, MD  aspirin EC 81 MG tablet Take 81 mg by mouth at bedtime. for AFIB    Historical Provider, MD   cholecalciferol (VITAMIN D) 1000 UNITS tablet Take 1,000 Units by mouth at bedtime. for vitamin D deficiency    Historical Provider, MD  Fluticasone-Salmeterol (ADVAIR) 250-50 MCG/DOSE AEPB Inhale 1 puff into the lungs 2 (two) times daily. for SOB/Wheeze. Rinse mouth after use.    Historical Provider, MD  HYDROcodone-acetaminophen (NORCO/VICODIN) 5-325 MG tablet Take 1 tablet by mouth every 12 (twelve) hours as needed for moderate pain.    Historical Provider, MD  isosorbide mononitrate (IMDUR) 30 MG 24 hr tablet Take 30 mg by mouth every morning. for hypertension    Historical Provider, MD  losartan (COZAAR) 100 MG tablet Take 100 mg by mouth daily.     Historical Provider, MD  memantine (NAMENDA XR) 28 MG CP24 24 hr capsule Take 28 mg by mouth daily.    Historical Provider, MD  metoprolol (LOPRESSOR) 50 MG tablet Take 50 mg by mouth 2 (two) times daily.     Historical Provider, MD  mirtazapine (REMERON) 15 MG tablet Take 15 mg by mouth daily.    Historical Provider, MD  montelukast (SINGULAIR) 10 MG tablet Take 10 mg by mouth at bedtime. for allergies    Historical Provider, MD  OXYGEN Inhale 2 mLs into the lungs as needed (to keep sats above 90%).     Historical Provider, MD  potassium chloride (K-DUR,KLOR-CON) 10 MEQ tablet Take 10 mEq by mouth daily.    Historical Provider, MD  simvastatin (ZOCOR) 10 MG tablet Take 30 mg by mouth at bedtime.  04/25/15   Historical Provider, MD    Family History Family History  Problem Relation Age of Onset  . Diabetes Mother   . High blood pressure Mother   . Heart Problems Father     Social History Social History  Substance Use Topics  . Smoking status: Never Smoker  . Smokeless tobacco: Never Used  . Alcohol use No     Allergies   Penicillins   Review of Systems Review of Systems  Unable to perform ROS: Dementia     Physical Exam Updated Vital Signs BP 179/71 (BP Location: Right Arm)   Pulse 63   Temp 97.7 F (36.5 C) (Axillary)    Resp (!) 28   SpO2 100%   Physical Exam  Constitutional: No distress.  Ill-appearing cachectic elderly female grossly dislocated jaw  HENT:  Head: Normocephalic and atraumatic.  Dislocated jaw, unable to close mouth  Eyes: EOM are normal. Pupils are equal, round, and reactive to light.  Pupils 2cm  Neck: Normal range of motion. Neck supple.  Cardiovascular: Normal rate and regular rhythm.  Exam reveals no gallop and no friction rub.   No murmur heard. Pulmonary/Chest: Effort normal. She has no wheezes. She has no rales.  Abdominal: Soft. She exhibits no distension. There is no tenderness.  Musculoskeletal: She exhibits no edema or tenderness.  Neurological: She is alert.  Minimally interactive, severe atrophy, does not move extremities spontaneously, does have trace grip strength bilaterally, opens and closes her jaw to command after reduction  Skin: Skin is warm and  dry. She is not diaphoretic.  Psychiatric:  Withdrawn, minimally interactive, but does nod head occasionally to specific questioning  Nursing note and vitals reviewed.    ED Treatments / Results  Labs (all labs ordered are listed, but only abnormal results are displayed) Labs Reviewed - No data to display  EKG  EKG Interpretation None       Radiology No results found.  Procedures Reduction of dislocation Date/Time: 03/05/2016 7:28 AM Performed by: Carmin Muskrat Authorized by: Carmin Muskrat  Consent: The procedure was performed in an emergent situation. Verbal consent not obtained. Written consent not obtained. Risks and benefits discussed: dementia, non-verbal patient. Consent given by: implied. Patient identity confirmed: arm band Time out: Immediately prior to procedure a "time out" was called to verify the correct patient, procedure, equipment, support staff and site/side marked as required. Preparation: Patient was prepped and draped in the usual sterile fashion. Local anesthesia used:  no  Anesthesia: Local anesthesia used: no  Sedation: Patient sedated: no Patient tolerance: Patient tolerated the procedure well with no immediate complications Comments: With manual manipulation successful jaw dislocation reduction was accomplished. Subsequently the patient was able to open and close her jaw to command, seemingly indicated decrease in pain. Patient was placed in an immobilizing strap.    (including critical care time)  Medications Ordered in ED Medications - No data to display   Initial Impression / Assessment and Plan / ED Course  I have reviewed the triage vital signs and the nursing notes.  Pertinent labs & imaging results that were available during my care of the patient were reviewed by me and considered in my medical decision making (see chart for details).  Clinical Course     Chronically ill elderly female presents nursing facility with jaw dislocation. No report of trauma, and although the patient is essentially nonverbal, she does not appear to be in distress, and after successful reduction of her jaw dislocation appeared more calm. Patient returned to her nursing facility.   Final Clinical Impressions(s) / ED Diagnoses   Final diagnoses:  Dislocation of temporomandibular joint, initial encounter    New Prescriptions New Prescriptions   No medications on file     Carmin Muskrat, MD 03/05/16 0730

## 2016-03-05 NOTE — ED Triage Notes (Signed)
[facility]- pt here from [facility] with jaw dislocation. This is a frequent reoccurrence for this patient. Hx of dementia and non-verbal with EMS. DNR at bedside.

## 2016-03-07 ENCOUNTER — Telehealth: Payer: Self-pay | Admitting: Cardiology

## 2016-03-07 ENCOUNTER — Encounter: Payer: Medicare Other | Admitting: *Deleted

## 2016-03-07 NOTE — Telephone Encounter (Signed)
Confirmed remote transmission w/ pt wife.   

## 2016-03-08 ENCOUNTER — Emergency Department (HOSPITAL_COMMUNITY)
Admission: EM | Admit: 2016-03-08 | Discharge: 2016-03-08 | Disposition: A | Discharge status: Home / Self Care | Attending: Emergency Medicine | Admitting: Emergency Medicine

## 2016-03-08 ENCOUNTER — Encounter (HOSPITAL_COMMUNITY): Payer: Self-pay | Admitting: Emergency Medicine

## 2016-03-08 DIAGNOSIS — X58XXXA Exposure to other specified factors, initial encounter: Secondary | ICD-10-CM | POA: Insufficient documentation

## 2016-03-08 DIAGNOSIS — Z79899 Other long term (current) drug therapy: Secondary | ICD-10-CM | POA: Insufficient documentation

## 2016-03-08 DIAGNOSIS — I130 Hypertensive heart and chronic kidney disease with heart failure and stage 1 through stage 4 chronic kidney disease, or unspecified chronic kidney disease: Secondary | ICD-10-CM | POA: Insufficient documentation

## 2016-03-08 DIAGNOSIS — Y999 Unspecified external cause status: Secondary | ICD-10-CM | POA: Insufficient documentation

## 2016-03-08 DIAGNOSIS — N189 Chronic kidney disease, unspecified: Secondary | ICD-10-CM | POA: Insufficient documentation

## 2016-03-08 DIAGNOSIS — Y9389 Activity, other specified: Secondary | ICD-10-CM | POA: Insufficient documentation

## 2016-03-08 DIAGNOSIS — Z7982 Long term (current) use of aspirin: Secondary | ICD-10-CM | POA: Insufficient documentation

## 2016-03-08 DIAGNOSIS — E039 Hypothyroidism, unspecified: Secondary | ICD-10-CM | POA: Diagnosis not present

## 2016-03-08 DIAGNOSIS — S0300XA Dislocation of jaw, unspecified side, initial encounter: Secondary | ICD-10-CM | POA: Insufficient documentation

## 2016-03-08 DIAGNOSIS — I5032 Chronic diastolic (congestive) heart failure: Secondary | ICD-10-CM | POA: Diagnosis not present

## 2016-03-08 DIAGNOSIS — Z950 Presence of cardiac pacemaker: Secondary | ICD-10-CM | POA: Diagnosis not present

## 2016-03-08 DIAGNOSIS — Y92129 Unspecified place in nursing home as the place of occurrence of the external cause: Secondary | ICD-10-CM | POA: Diagnosis not present

## 2016-03-08 DIAGNOSIS — Z8673 Personal history of transient ischemic attack (TIA), and cerebral infarction without residual deficits: Secondary | ICD-10-CM | POA: Diagnosis not present

## 2016-03-08 DIAGNOSIS — J45909 Unspecified asthma, uncomplicated: Secondary | ICD-10-CM | POA: Insufficient documentation

## 2016-03-08 MED ORDER — FENTANYL CITRATE (PF) 100 MCG/2ML IJ SOLN
50.0000 ug | Freq: Once | INTRAMUSCULAR | Status: AC
  Administered 2016-03-08: 50 ug via INTRAVENOUS
  Filled 2016-03-08: qty 2

## 2016-03-08 NOTE — ED Provider Notes (Signed)
[facility] DEPT Provider Note   CSN: [id number] Arrival date & time: 03/08/16  [id number]  By signing my name below, I, Oleh Genin, attest that this documentation has been prepared under the direction and in the presence of Orpah Greek, MD. Electronically Signed: Oleh Genin, Scribe. 03/08/16. 2:23 AM.  Wheezing Food in throat Nursing staff concerned for stuck in throat    History   Chief Complaint Chief Complaint  Patient presents with  . Jaw Dislocation   LEVEL 5 CAVEAT DUE TO DEMENTIA   HPI Yvonne Buck is a 83 y.o. non-verbal female who is presenting with concern for jaw dislocation from skilled nursing facility. According to medic report, this patient was eating crackers at her nursing facility earlier this evening when nurses believe the patient dislocated her jaw. At interview, the patient is holding her jaw open and not in acute distress. At baseline, the patient is reportedly oriented to herself and responsive to command.  A complete history is limited by dementia.    The history is provided by the nursing home and the EMS personnel. No language interpreter was used.    Past Medical History:  Diagnosis Date  . Anxiety   . Asthma   . Chronic kidney disease   . Depression   . Fracture of greater trochanter of left femur ([place]) 04/02/2013  . Frequent falls   . Hip fracture, left ([place]) 04/02/2013  . Hyperlipidemia   . Hypertension   . Hypothyroidism   . Osteoporosis 06/23/2013  . Pacemaker   . Paroxysmal atrial fibrillation (HCC)    chads2vasc score is at least 6.  She is not a candidate for anticoagulation due to falls.  Her AF burden is low.  . Pelvic fracture ([place]) 06/23/2013  . Rhinitis, allergic 02/26/2014  . Senile osteoporosis 02/26/2014  . Sick sinus syndrome ([place])   . Stroke ([place])   . Tachycardia-bradycardia syndrome [facility])    s/p [employer] PPM implant in [place]  . TMJ (dislocation of temporomandibular joint) 06/16/2014  . Vitamin D  deficiency     Patient Active Problem List   Diagnosis Date Noted  . Benign hypertensive heart disease with CHF (congestive heart failure) ([place]) 01/18/2016  . Dysphagia 06/22/2015  . Dementia with behavioral disturbance 04/23/2015  . Vitamin D deficiency 04/23/2015  . Protein-calorie malnutrition ([place]) 04/23/2015  . Insomnia 12/28/2014  . Chronic diastolic congestive heart failure ([place]) 10/15/2014  . Hypertensive heart and renal disease 10/15/2014  . Frequent falls 08/07/2014  . Essential hypertension 08/07/2014  . Protein-calorie malnutrition, severe ([place]) 06/22/2014  . Bronchitis 06/21/2014  . Hypoxia 06/21/2014  . Weakness 06/17/2014  . Steroid-induced hyperglycemia 06/17/2014  . TMJ (dislocation of temporomandibular joint) 06/16/2014  . Acute respiratory failure with hypoxia ([place]) 06/16/2014  . Hypokalemia 06/16/2014  . Sick sinus syndrome ([place]) 03/24/2014  . Hyperlipidemia 02/26/2014  . Senile osteoporosis 02/26/2014  . Asthmatic bronchitis 02/26/2014  . Rhinitis, allergic 02/26/2014  . Essential hypertension, benign 07/07/2013  . Chronic asthma 06/16/2013  . Depression with anxiety 02/05/2013  . Gait instability 01/14/2013  . Left spastic hemiparesis ([place]) 11/16/2012  . Tachycardia-bradycardia syndrome ([place]) 01/06/2011  . Atrial fibrillation ([place]) 01/06/2011  . Hypothyroidism 12/10/2010    Past Surgical History:  Procedure Laterality Date  . CHOLECYSTECTOMY    . EP IMPLANTABLE DEVICE N/A 09/03/2015   Gen change with a St Jude Mediacl AssurityMRI DR pacemaker  . GALLBLADDER SURGERY    . PACEMAKER INSERTION  01/04/2005   [employer] Insignia PPM 1290 (SN  [id number]), GDT 4480 atrial lead and 4457 V lead all implanted in [place]      OB History    No data available       Home Medications    Prior to Admission medications   Medication Sig Start Date End Date Taking? Authorizing Provider  acetaminophen (TYLENOL) 325 MG tablet  Take 650 mg by mouth every 4 (four) hours as needed for moderate pain.   Yes Historical Provider, MD  acetaminophen (TYLENOL) 500 MG tablet Take 1,000 mg by mouth 3 (three) times daily.   Yes Historical Provider, MD  amLODipine (NORVASC) 5 MG tablet Take 5 mg by mouth daily.  08/21/14  Yes Historical Provider, MD  aspirin EC 81 MG tablet Take 81 mg by mouth at bedtime. for AFIB   Yes Historical Provider, MD  cholecalciferol (VITAMIN D) 1000 UNITS tablet Take 1,000 Units by mouth at bedtime. for vitamin D deficiency   Yes Historical Provider, MD  Fluticasone-Salmeterol (ADVAIR) 250-50 MCG/DOSE AEPB Inhale 1 puff into the lungs 2 (two) times daily. for SOB/Wheeze. Rinse mouth after use.   Yes Historical Provider, MD  HYDROcodone-acetaminophen (NORCO/VICODIN) 5-325 MG tablet Take 1 tablet by mouth daily as needed for moderate pain.    Yes Historical Provider, MD  isosorbide mononitrate (IMDUR) 30 MG 24 hr tablet Take 30 mg by mouth every morning. for hypertension   Yes Historical Provider, MD  losartan (COZAAR) 100 MG tablet Take 100 mg by mouth daily.    Yes Historical Provider, MD  memantine (NAMENDA XR) 28 MG CP24 24 hr capsule Take 28 mg by mouth daily.   Yes Historical Provider, MD  metoprolol (LOPRESSOR) 50 MG tablet Take 50 mg by mouth 2 (two) times daily.    Yes Historical Provider, MD  mirtazapine (REMERON) 7.5 MG tablet Take 7.5 mg by mouth at bedtime.   Yes Historical Provider, MD  montelukast (SINGULAIR) 10 MG tablet Take 10 mg by mouth at bedtime. for allergies   Yes Historical Provider, MD  potassium chloride (K-DUR,KLOR-CON) 10 MEQ tablet Take 10 mEq by mouth daily.   Yes Historical Provider, MD  simvastatin (ZOCOR) 10 MG tablet Take 30 mg by mouth at bedtime.  04/25/15  Yes Historical Provider, MD  UNABLE TO FIND Take 90 mLs by mouth 2 (two) times daily. Med Name: Med Pass   Yes Historical Provider, MD  OXYGEN Inhale 2 mLs into the lungs as needed (to keep sats above 90%).     Historical  Provider, MD    Family History Family History  Problem Relation Age of Onset  . Diabetes Mother   . High blood pressure Mother   . Heart Problems Father     Social History Social History  Substance Use Topics  . Smoking status: Never Smoker  . Smokeless tobacco: Never Used  . Alcohol use No     Allergies   Penicillins   Review of Systems Review of Systems  Unable to perform ROS: Dementia     Physical Exam Updated Vital Signs BP 132/57   Pulse 72   Temp 97.6 F (36.4 C) (Oral)   Resp 22   SpO2 91%   Physical Exam  Constitutional: She appears well-developed and well-nourished. No distress.  HENT:  Head: Normocephalic and atraumatic.  Right Ear: Hearing normal.  Left Ear: Hearing normal.  Nose: Nose normal.  Mouth/Throat: Oropharynx is clear and moist and mucous membranes are normal.  Jaw is held open.   Eyes: Conjunctivae  and EOM are normal. Pupils are equal, round, and reactive to light.  Neck: Normal range of motion. Neck supple.  Cardiovascular: Regular rhythm, S1 normal and S2 normal.  Exam reveals no gallop and no friction rub.   No murmur heard. Pulmonary/Chest: Effort normal and breath sounds normal. No respiratory distress. She exhibits no tenderness.  Abdominal: Soft. Normal appearance and bowel sounds are normal. There is no hepatosplenomegaly. There is no tenderness. There is no rebound, no guarding, no tenderness at McBurney's point and negative Murphy's sign. No hernia.  Musculoskeletal: Normal range of motion.  Neurological:  Neuro exam is limited by dementia.   Skin: Skin is warm, dry and intact. No rash noted. No cyanosis.  Psychiatric: Her speech is normal.  Psychiatric exam is limited due to dementia.   Nursing note and vitals reviewed.    ED Treatments / Results  DIAGNOSTIC STUDIES: Oxygen Saturation is 93 percent on room air which is normal by my interpretation.    COORDINATION OF CARE: 12:43 AM First encounter.    Labs (all  labs ordered are listed, but only abnormal results are displayed) Labs Reviewed - No data to display  EKG  EKG Interpretation None       Radiology No results found.  Procedures Reduction of dislocation Date/Time: 03/08/2016 2:21 AM Performed by: Orpah Greek Authorized by: Orpah Greek  Consent: The procedure was performed in an emergent situation. Patient identity confirmed: arm band and hospital-assigned identification number Time out: Immediately prior to procedure a "time out" was called to verify the correct patient, procedure, equipment, support staff and site/side marked as required. Local anesthesia used: no  Anesthesia: Local anesthesia used: no  Sedation: Patient sedated: no Patient tolerance: Patient tolerated the procedure well with no immediate complications Comments: TMJ dislocation reduction performed by placing gentle downward pressure on the mandible until reduction was achieved.    (including critical care time)  Medications Ordered in ED Medications  fentaNYL (SUBLIMAZE) injection 50 mcg (50 mcg Intravenous Given 03/08/16 0115)     Initial Impression / Assessment and Plan / ED Course  I have reviewed the triage vital signs and the nursing notes.  Pertinent labs & imaging results that were available during my care of the patient were reviewed by me and considered in my medical decision making (see chart for details).  Clinical Course    Issue with recurrent spontaneous dislocations of her jaw presents with dislocation. Patient premedicated with fentanyl and reduction was performed without additional sedation.  Final Clinical Impressions(s) / ED Diagnoses   Final diagnoses:  Jaw dislocation, initial encounter    New Prescriptions New Prescriptions   No medications on file  I personally performed the services described in this documentation, which was scribed in my presence. The recorded information has been reviewed and is  accurate.     Orpah Greek, MD 03/08/16 [phone]

## 2016-03-08 NOTE — ED Notes (Signed)
Pt is non-verbal. Pt sits with her mouth open.

## 2016-03-08 NOTE — ED Triage Notes (Signed)
Pt is non-verbal. Pt sits with her mouth open.

## 2016-03-08 NOTE — ED Triage Notes (Signed)
[facility] reports the pt has frequent jaw dislocations. EMS reports the patient was eating crackers when her jaw dislocated.   Pt is non-verbal. Pt sits with her mouth open.

## 2016-03-11 ENCOUNTER — Encounter: Payer: Self-pay | Admitting: Cardiology

## 2016-03-17 ENCOUNTER — Encounter (HOSPITAL_COMMUNITY): Payer: Self-pay

## 2016-03-17 ENCOUNTER — Emergency Department (HOSPITAL_COMMUNITY)
Admission: EM | Admit: 2016-03-17 | Discharge: 2016-03-17 | Disposition: A | Discharge status: Home / Self Care | Attending: Emergency Medicine | Admitting: Emergency Medicine

## 2016-03-17 DIAGNOSIS — Y929 Unspecified place or not applicable: Secondary | ICD-10-CM | POA: Diagnosis not present

## 2016-03-17 DIAGNOSIS — Y939 Activity, unspecified: Secondary | ICD-10-CM | POA: Insufficient documentation

## 2016-03-17 DIAGNOSIS — E039 Hypothyroidism, unspecified: Secondary | ICD-10-CM | POA: Diagnosis not present

## 2016-03-17 DIAGNOSIS — Z8673 Personal history of transient ischemic attack (TIA), and cerebral infarction without residual deficits: Secondary | ICD-10-CM | POA: Diagnosis not present

## 2016-03-17 DIAGNOSIS — X58XXXA Exposure to other specified factors, initial encounter: Secondary | ICD-10-CM | POA: Diagnosis not present

## 2016-03-17 DIAGNOSIS — I130 Hypertensive heart and chronic kidney disease with heart failure and stage 1 through stage 4 chronic kidney disease, or unspecified chronic kidney disease: Secondary | ICD-10-CM | POA: Diagnosis not present

## 2016-03-17 DIAGNOSIS — Z79899 Other long term (current) drug therapy: Secondary | ICD-10-CM | POA: Insufficient documentation

## 2016-03-17 DIAGNOSIS — N189 Chronic kidney disease, unspecified: Secondary | ICD-10-CM | POA: Insufficient documentation

## 2016-03-17 DIAGNOSIS — Z950 Presence of cardiac pacemaker: Secondary | ICD-10-CM | POA: Diagnosis not present

## 2016-03-17 DIAGNOSIS — S0301XA Dislocation of jaw, right side, initial encounter: Secondary | ICD-10-CM | POA: Insufficient documentation

## 2016-03-17 DIAGNOSIS — J45909 Unspecified asthma, uncomplicated: Secondary | ICD-10-CM | POA: Diagnosis not present

## 2016-03-17 DIAGNOSIS — I5032 Chronic diastolic (congestive) heart failure: Secondary | ICD-10-CM | POA: Diagnosis not present

## 2016-03-17 DIAGNOSIS — Y999 Unspecified external cause status: Secondary | ICD-10-CM | POA: Insufficient documentation

## 2016-03-17 DIAGNOSIS — Z7982 Long term (current) use of aspirin: Secondary | ICD-10-CM | POA: Diagnosis not present

## 2016-03-17 DIAGNOSIS — S0300XA Dislocation of jaw, unspecified side, initial encounter: Secondary | ICD-10-CM

## 2016-03-17 MED ORDER — FENTANYL CITRATE (PF) 100 MCG/2ML IJ SOLN
100.0000 ug | Freq: Once | INTRAMUSCULAR | Status: AC
  Administered 2016-03-17: 100 ug via INTRAMUSCULAR
  Filled 2016-03-17: qty 2

## 2016-03-17 NOTE — ED Notes (Signed)
Pt tolerating PO fluids well w/o difficulty. Lower jaw remains in place.

## 2016-03-17 NOTE — ED Notes (Signed)
[facility] at patient bedside.

## 2016-03-17 NOTE — ED Notes (Signed)
Called [facility] for transport.  

## 2016-03-17 NOTE — ED Provider Notes (Signed)
[facility] DEPT Provider Note   CSN: [id number] Arrival date & time: 03/17/16  1157     History   Chief Complaint Chief Complaint  Patient presents with  . Dislocation    Lower Jaw    HPI Yvonne Buck is a 83 y.o. female.  Level V caveat: Dementia, non-verbal.   Yvonne Buck is a 83 y.o. Female who presents to the ED with concern for jaw dislocation from her skilled nursing facility. Patient's had multiple jaw dislocations in the past. This occurred earlier today. She is non-verbal at baseline. No jaw injury.    The history is provided by medical records. No language interpreter was used.    Past Medical History:  Diagnosis Date  . Anxiety   . Asthma   . Chronic kidney disease   . Depression   . Fracture of greater trochanter of left femur ([place]) 04/02/2013  . Frequent falls   . Hip fracture, left ([place]) 04/02/2013  . Hyperlipidemia   . Hypertension   . Hypothyroidism   . Osteoporosis 06/23/2013  . Pacemaker   . Paroxysmal atrial fibrillation (HCC)    chads2vasc score is at least 6.  She is not a candidate for anticoagulation due to falls.  Her AF burden is low.  . Pelvic fracture ([place]) 06/23/2013  . Rhinitis, allergic 02/26/2014  . Senile osteoporosis 02/26/2014  . Sick sinus syndrome ([place])   . Stroke ([place])   . Tachycardia-bradycardia syndrome [facility])    s/p [employer] PPM implant in [place]  . TMJ (dislocation of temporomandibular joint) 06/16/2014  . Vitamin D deficiency     Patient Active Problem List   Diagnosis Date Noted  . Benign hypertensive heart disease with CHF (congestive heart failure) ([place]) 01/18/2016  . Dysphagia 06/22/2015  . Dementia with behavioral disturbance 04/23/2015  . Vitamin D deficiency 04/23/2015  . Protein-calorie malnutrition ([place]) 04/23/2015  . Insomnia 12/28/2014  . Chronic diastolic congestive heart failure ([place]) 10/15/2014  . Hypertensive heart and renal disease 10/15/2014  . Frequent falls 08/07/2014  . Essential  hypertension 08/07/2014  . Protein-calorie malnutrition, severe ([place]) 06/22/2014  . Bronchitis 06/21/2014  . Hypoxia 06/21/2014  . Weakness 06/17/2014  . Steroid-induced hyperglycemia 06/17/2014  . TMJ (dislocation of temporomandibular joint) 06/16/2014  . Acute respiratory failure with hypoxia ([place]) 06/16/2014  . Hypokalemia 06/16/2014  . Sick sinus syndrome ([place]) 03/24/2014  . Hyperlipidemia 02/26/2014  . Senile osteoporosis 02/26/2014  . Asthmatic bronchitis 02/26/2014  . Rhinitis, allergic 02/26/2014  . Essential hypertension, benign 07/07/2013  . Chronic asthma 06/16/2013  . Depression with anxiety 02/05/2013  . Gait instability 01/14/2013  . Left spastic hemiparesis ([place]) 11/16/2012  . Tachycardia-bradycardia syndrome ([place]) 01/06/2011  . Atrial fibrillation ([place]) 01/06/2011  . Hypothyroidism 12/10/2010    Past Surgical History:  Procedure Laterality Date  . CHOLECYSTECTOMY    . EP IMPLANTABLE DEVICE N/A 09/03/2015   Gen change with a St Jude Mediacl AssurityMRI DR pacemaker  . GALLBLADDER SURGERY    . PACEMAKER INSERTION  01/04/2005   [employer] [place] PPM 1290 [phone]), GDT [id number] atrial lead and 4457 V lead all implanted in [place]      OB History    No data available       Home Medications    Prior to Admission medications   Medication Sig Start Date End Date Taking? Authorizing Provider  acetaminophen (TYLENOL) 325 MG tablet Take 650 mg by mouth every 4 (four) hours as needed for moderate pain.  Yes Historical Provider, MD  acetaminophen (TYLENOL) 500 MG tablet Take 1,000 mg by mouth 3 (three) times daily.   Yes Historical Provider, MD  amLODipine (NORVASC) 5 MG tablet Take 5 mg by mouth daily.  08/21/14  Yes Historical Provider, MD  aspirin EC 81 MG tablet Take 81 mg by mouth at bedtime. for AFIB   Yes Historical Provider, MD  cholecalciferol (VITAMIN D) 1000 UNITS tablet Take 1,000 Units by mouth at bedtime. for  vitamin D deficiency   Yes Historical Provider, MD  Fluticasone-Salmeterol (ADVAIR) 250-50 MCG/DOSE AEPB Inhale 1 puff into the lungs 2 (two) times daily. for SOB/Wheeze. Rinse mouth after use.   Yes Historical Provider, MD  HYDROcodone-acetaminophen (NORCO/VICODIN) 5-325 MG tablet Take 1 tablet by mouth daily as needed for moderate pain.    Yes Historical Provider, MD  isosorbide mononitrate (IMDUR) 30 MG 24 hr tablet Take 30 mg by mouth every morning. for hypertension   Yes Historical Provider, MD  losartan (COZAAR) 100 MG tablet Take 100 mg by mouth daily.    Yes Historical Provider, MD  memantine (NAMENDA XR) 28 MG CP24 24 hr capsule Take 28 mg by mouth daily.   Yes Historical Provider, MD  metoprolol (LOPRESSOR) 50 MG tablet Take 50 mg by mouth 2 (two) times daily.    Yes Historical Provider, MD  mirtazapine (REMERON) 7.5 MG tablet Take 7.5 mg by mouth at bedtime.   Yes Historical Provider, MD  montelukast (SINGULAIR) 10 MG tablet Take 10 mg by mouth at bedtime. for allergies   Yes Historical Provider, MD  OXYGEN Inhale 2 mLs into the lungs as needed (to keep sats above 90%).    Yes Historical Provider, MD  potassium chloride (K-DUR,KLOR-CON) 10 MEQ tablet Take 10 mEq by mouth daily.   Yes Historical Provider, MD  simvastatin (ZOCOR) 10 MG tablet Take 30 mg by mouth at bedtime.  04/25/15  Yes Historical Provider, MD  UNABLE TO FIND Take 90 mLs by mouth 2 (two) times daily. Med Name: Med Pass   Yes Historical Provider, MD    Family History Family History  Problem Relation Age of Onset  . Diabetes Mother   . High blood pressure Mother   . Heart Problems Father     Social History Social History  Substance Use Topics  . Smoking status: Never Smoker  . Smokeless tobacco: Never Used  . Alcohol use No     Allergies   Penicillins   Review of Systems Review of Systems  Unable to perform ROS: Dementia     Physical Exam Updated Vital Signs BP 144/67 (BP Location: Right Arm)    Pulse 61   Temp 97.4 F (36.3 C) (Axillary)   Resp 16   SpO2 100%   Physical Exam  Constitutional: She appears well-developed and well-nourished. No distress.  HENT:  Head: Normocephalic and atraumatic.  Mouth/Throat: Oropharynx is clear and moist.  Obvious dislocation of jaw at her right TMJ joint. Jaw is held open. Mucous membranes are slightly dry.   Eyes: Pupils are equal, round, and reactive to light. Right eye exhibits no discharge. Left eye exhibits no discharge.  Cardiovascular: Normal rate, regular rhythm, normal heart sounds and intact distal pulses.   Pulmonary/Chest: Effort normal and breath sounds normal. No respiratory distress.  Neurological: She is alert. Coordination normal.  Skin: Skin is warm and dry. Capillary refill takes less than 2 seconds. No rash noted. She is not diaphoretic. No erythema. No pallor.  Psychiatric: She has a normal  mood and affect. Her behavior is normal.  Nursing note and vitals reviewed.    ED Treatments / Results  Labs (all labs ordered are listed, but only abnormal results are displayed) Labs Reviewed - No data to display  EKG  EKG Interpretation None       Radiology No results found.  Procedures Reduction of dislocation Date/Time: 03/17/2016 12:33 PM Performed by: Waynetta Pean Authorized by: Waynetta Pean  Consent: The procedure was performed in an emergent situation. Local anesthesia used: no  Anesthesia: Local anesthesia used: no  Sedation: Patient sedated: no Patient tolerance: Patient tolerated the procedure well with no immediate complications Comments: Reduction of dislocation of right TMJ joint of Jaw.     (including critical care time)  Medications Ordered in ED Medications  fentaNYL (SUBLIMAZE) injection 100 mcg (100 mcg Intramuscular Given 03/17/16 1226)     Initial Impression / Assessment and Plan / ED Course  I have reviewed the triage vital signs and the nursing notes.  Pertinent labs &  imaging results that were available during my care of the patient were reviewed by me and considered in my medical decision making (see chart for details).    Patient presents with jaw dislocation. She has had multiple in the past. I Successfully reduced her jaw dislocation. Patient is able to close her mouth with her teeth in good alignment. She is able to tolerate by mouth in the emergency department. We'll discharge back to her skilled nursing facility follow-up with primary care doctor.   This patient was discussed with and evaluated by Dr. Ralene Bathe who agrees with assessment and plan.   Final Clinical Impressions(s) / ED Diagnoses   Final diagnoses:  Dislocation of temporomandibular joint, initial encounter    New Prescriptions New Prescriptions   No medications on file     Waynetta Pean, PA-C 03/17/16 [place], MD 03/17/16 1606

## 2016-03-17 NOTE — ED Notes (Signed)
Attempted to call report to facility without success

## 2016-03-17 NOTE — Progress Notes (Signed)
[facility] Liaison RN visit:  Visited with patient at bedside.   Patient was asleep during most of my visit.   Patient had just had jaw reduced.  Per Ati, RN, patient doing well from reduction and will be discharged back to facility after short observation.    Thank you.  Edyth Gunnels, RN, BSN [facility] Liaison [phone]  All hospital liaison's are now on [place].  Please call me at the above number with any questions or you can call hospice at [phone] after 5pm.

## 2016-03-17 NOTE — ED Notes (Signed)
Bed: WA03 Expected date:  Expected time:  Means of arrival:  Comments: EMS- 83 yo F, dislocated jaw

## 2016-03-17 NOTE — ED Triage Notes (Signed)
BIB EMS from [facility], w/ dislocation of lower jaw. Pt arrives alert.  EMS Vitals  65 HR 131 CBG  14 RR 94% on RA

## 2016-03-17 NOTE — Progress Notes (Addendum)
ED CM reviewed pt ED notes for this encounter  ED CM spoke with ED SW about this pt  Pt noted with 11 [facility] ED visits and no admissions in the last 6 months coming from [facility] No ED CP and no [facility] referral availability  PMH dementia

## 2016-04-01 ENCOUNTER — Encounter (HOSPITAL_COMMUNITY): Payer: Self-pay | Admitting: Emergency Medicine

## 2016-04-01 ENCOUNTER — Emergency Department (HOSPITAL_COMMUNITY)

## 2016-04-01 ENCOUNTER — Emergency Department (HOSPITAL_COMMUNITY)
Admission: EM | Admit: 2016-04-01 | Discharge: 2016-04-01 | Disposition: A | Discharge status: Home / Self Care | Attending: Emergency Medicine | Admitting: Emergency Medicine

## 2016-04-01 DIAGNOSIS — Z8673 Personal history of transient ischemic attack (TIA), and cerebral infarction without residual deficits: Secondary | ICD-10-CM | POA: Insufficient documentation

## 2016-04-01 DIAGNOSIS — E039 Hypothyroidism, unspecified: Secondary | ICD-10-CM | POA: Insufficient documentation

## 2016-04-01 DIAGNOSIS — I130 Hypertensive heart and chronic kidney disease with heart failure and stage 1 through stage 4 chronic kidney disease, or unspecified chronic kidney disease: Secondary | ICD-10-CM | POA: Insufficient documentation

## 2016-04-01 DIAGNOSIS — Y999 Unspecified external cause status: Secondary | ICD-10-CM | POA: Diagnosis not present

## 2016-04-01 DIAGNOSIS — Z950 Presence of cardiac pacemaker: Secondary | ICD-10-CM | POA: Diagnosis not present

## 2016-04-01 DIAGNOSIS — I5032 Chronic diastolic (congestive) heart failure: Secondary | ICD-10-CM | POA: Insufficient documentation

## 2016-04-01 DIAGNOSIS — Z79899 Other long term (current) drug therapy: Secondary | ICD-10-CM | POA: Insufficient documentation

## 2016-04-01 DIAGNOSIS — X58XXXA Exposure to other specified factors, initial encounter: Secondary | ICD-10-CM | POA: Diagnosis not present

## 2016-04-01 DIAGNOSIS — N189 Chronic kidney disease, unspecified: Secondary | ICD-10-CM | POA: Diagnosis not present

## 2016-04-01 DIAGNOSIS — S0302XA Dislocation of jaw, left side, initial encounter: Secondary | ICD-10-CM | POA: Insufficient documentation

## 2016-04-01 DIAGNOSIS — Y939 Activity, unspecified: Secondary | ICD-10-CM | POA: Diagnosis not present

## 2016-04-01 DIAGNOSIS — S0300XA Dislocation of jaw, unspecified side, initial encounter: Secondary | ICD-10-CM

## 2016-04-01 DIAGNOSIS — Z7982 Long term (current) use of aspirin: Secondary | ICD-10-CM | POA: Insufficient documentation

## 2016-04-01 DIAGNOSIS — Y929 Unspecified place or not applicable: Secondary | ICD-10-CM | POA: Insufficient documentation

## 2016-04-01 DIAGNOSIS — R6884 Jaw pain: Secondary | ICD-10-CM | POA: Diagnosis present

## 2016-04-01 MED ORDER — FENTANYL CITRATE (PF) 100 MCG/2ML IJ SOLN
50.0000 ug | Freq: Once | INTRAMUSCULAR | Status: AC
  Administered 2016-04-01: 50 ug via INTRAVENOUS

## 2016-04-01 MED ORDER — FENTANYL CITRATE (PF) 100 MCG/2ML IJ SOLN: 50.0000 ug | Freq: Once | INTRAMUSCULAR | Status: DC

## 2016-04-01 MED ORDER — FENTANYL CITRATE (PF) 100 MCG/2ML IJ SOLN
50.0000 ug | Freq: Once | INTRAMUSCULAR | Status: DC
  Filled 2016-04-01: qty 2

## 2016-04-01 NOTE — ED Provider Notes (Signed)
[facility] DEPT Provider Note   CSN: [id number] Arrival date & time: 04/01/16  1034     History   Chief Complaint Chief Complaint  Patient presents with  . Jaw Pain    HPI  Blood pressure 154/66, pulse 60, temperature 97.3 F (36.3 C), temperature source Axillary, resp. rate 18, weight 48.1 kg, SpO2 98 %.  Yvonne Buck is a 83 y.o. female sent for evaluation of jaw dislocation. She has had this in the past. Patient is nonverbal at her baseline. Level V caveat.  Past Medical History:  Diagnosis Date  . Anxiety   . Asthma   . Chronic kidney disease   . Depression   . Fracture of greater trochanter of left femur ([place]) 04/02/2013  . Frequent falls   . Hip fracture, left ([place]) 04/02/2013  . Hyperlipidemia   . Hypertension   . Hypothyroidism   . Osteoporosis 06/23/2013  . Pacemaker   . Paroxysmal atrial fibrillation (HCC)    chads2vasc score is at least 6.  She is not a candidate for anticoagulation due to falls.  Her AF burden is low.  . Pelvic fracture ([place]) 06/23/2013  . Rhinitis, allergic 02/26/2014  . Senile osteoporosis 02/26/2014  . Sick sinus syndrome ([place])   . Stroke ([place])   . Tachycardia-bradycardia syndrome [facility])    s/p [employer] PPM implant in [place]  . TMJ (dislocation of temporomandibular joint) 06/16/2014  . Vitamin D deficiency     Patient Active Problem List   Diagnosis Date Noted  . Benign hypertensive heart disease with CHF (congestive heart failure) ([place]) 01/18/2016  . Dysphagia 06/22/2015  . Dementia with behavioral disturbance 04/23/2015  . Vitamin D deficiency 04/23/2015  . Protein-calorie malnutrition ([place]) 04/23/2015  . Insomnia 12/28/2014  . Chronic diastolic congestive heart failure ([place]) 10/15/2014  . Hypertensive heart and renal disease 10/15/2014  . Frequent falls 08/07/2014  . Essential hypertension 08/07/2014  . Protein-calorie malnutrition, severe ([place]) 06/22/2014  . Bronchitis 06/21/2014  . Hypoxia 06/21/2014  . Weakness  06/17/2014  . Steroid-induced hyperglycemia 06/17/2014  . TMJ (dislocation of temporomandibular joint) 06/16/2014  . Acute respiratory failure with hypoxia ([place]) 06/16/2014  . Hypokalemia 06/16/2014  . Sick sinus syndrome ([place]) 03/24/2014  . Hyperlipidemia 02/26/2014  . Senile osteoporosis 02/26/2014  . Asthmatic bronchitis 02/26/2014  . Rhinitis, allergic 02/26/2014  . Essential hypertension, benign 07/07/2013  . Chronic asthma 06/16/2013  . Depression with anxiety 02/05/2013  . Gait instability 01/14/2013  . Left spastic hemiparesis ([place]) 11/16/2012  . Tachycardia-bradycardia syndrome ([place]) 01/06/2011  . Atrial fibrillation ([place]) 01/06/2011  . Hypothyroidism 12/10/2010    Past Surgical History:  Procedure Laterality Date  . CHOLECYSTECTOMY    . EP IMPLANTABLE DEVICE N/A 09/03/2015   Gen change with a St Jude Mediacl AssurityMRI DR pacemaker  . GALLBLADDER SURGERY    . PACEMAKER INSERTION  01/04/2005   [employer] [place] PPM [id number] [phone]), GDT [phone] atrial lead and 4457 V lead all implanted in [place]      OB History    No data available       Home Medications    Prior to Admission medications   Medication Sig Start Date End Date Taking? Authorizing Provider  acetaminophen (TYLENOL) 325 MG tablet Take 650 mg by mouth every 4 (four) hours as needed for mild pain or moderate pain.   Yes Historical Provider, MD  acetaminophen (TYLENOL) 500 MG tablet Take 1,000 mg by mouth 3 (three) times daily as  needed for mild pain or moderate pain.    Yes Historical Provider, MD  amLODipine (NORVASC) 5 MG tablet Take 5 mg by mouth daily.  08/21/14  Yes Historical Provider, MD  aspirin EC 81 MG tablet Take 81 mg by mouth at bedtime. for AFIB   Yes Historical Provider, MD  cholecalciferol (VITAMIN D) 1000 UNITS tablet Take 1,000 Units by mouth at bedtime. for vitamin D deficiency   Yes Historical Provider, MD  Fluticasone-Salmeterol (ADVAIR) 250-50  MCG/DOSE AEPB Inhale 1 puff into the lungs 2 (two) times daily. for SOB/Wheeze. Rinse mouth after use.   Yes Historical Provider, MD  HYDROcodone-acetaminophen (NORCO/VICODIN) 5-325 MG tablet Take 1 tablet by mouth daily as needed for moderate pain.    Yes Historical Provider, MD  isosorbide mononitrate (IMDUR) 30 MG 24 hr tablet Take 30 mg by mouth every morning. for hypertension   Yes Historical Provider, MD  losartan (COZAAR) 100 MG tablet Take 100 mg by mouth daily.    Yes Historical Provider, MD  memantine (NAMENDA XR) 28 MG CP24 24 hr capsule Take 28 mg by mouth daily.   Yes Historical Provider, MD  metoprolol (LOPRESSOR) 50 MG tablet Take 50 mg by mouth 2 (two) times daily.    Yes Historical Provider, MD  mirtazapine (REMERON) 7.5 MG tablet Take 7.5 mg by mouth at bedtime.   Yes Historical Provider, MD  montelukast (SINGULAIR) 10 MG tablet Take 10 mg by mouth at bedtime. for allergies   Yes Historical Provider, MD  OXYGEN Inhale 2 mLs into the lungs as needed (to keep sats above 90%).    Yes Historical Provider, MD  potassium chloride (K-DUR,KLOR-CON) 10 MEQ tablet Take 10 mEq by mouth daily.   Yes Historical Provider, MD  simvastatin (ZOCOR) 10 MG tablet Take 30 mg by mouth at bedtime.  04/25/15  Yes Historical Provider, MD  UNABLE TO FIND Take 90 mLs by mouth 2 (two) times daily. Med Name: Med Pass   Yes Historical Provider, MD    Family History Family History  Problem Relation Age of Onset  . Diabetes Mother   . High blood pressure Mother   . Heart Problems Father     Social History Social History  Substance Use Topics  . Smoking status: Never Smoker  . Smokeless tobacco: Never Used  . Alcohol use No     Allergies   Penicillins   Review of Systems Review of Systems  10 systems reviewed and found to be negative, except as noted in the HPI.   Physical Exam Updated Vital Signs BP 154/66 (BP Location: Left Arm)   Pulse 60   Temp 97.3 F (36.3 C) (Axillary)   Resp 18    Wt 48.1 kg   SpO2 98%   BMI 16.12 kg/m   Physical Exam  Constitutional: She is oriented to person, place, and time. She appears well-developed and well-nourished. No distress.  HENT:  Head: Atraumatic.  Mouth/Throat: Oropharynx is clear and moist.  Patient with jaw held open, left-sided TMJ dislocation. Mildly dry mucous membranes.  Eyes: Conjunctivae and EOM are normal. Pupils are equal, round, and reactive to light.  Neck: Normal range of motion.  Cardiovascular: Normal rate, regular rhythm and intact distal pulses.   Pulmonary/Chest: Effort normal and breath sounds normal.  Abdominal: Soft. There is no tenderness.  Musculoskeletal: Normal range of motion.  Neurological: She is alert and oriented to person, place, and time.  Skin: She is not diaphoretic.  Psychiatric: She has a normal mood  and affect.  Nursing note and vitals reviewed.    ED Treatments / Results  Labs (all labs ordered are listed, but only abnormal results are displayed) Labs Reviewed - No data to display  EKG  EKG Interpretation None       Radiology Ct Maxillofacial Wo Contrast  Result Date: 04/01/2016 CLINICAL DATA:  TMJ dislocation, history of prior TMJ dislocation, paroxysmal atrial fibrillation, hypertension, frequent falls EXAM: CT MAXILLOFACIAL WITHOUT CONTRAST TECHNIQUE: Multidetector CT imaging of the maxillofacial structures was performed. Multiplanar CT image reconstructions were also generated. A small metallic BB was placed on the right temple in order to reliably differentiate right from left. Scattered patient motion artifacts are present despite repeat imaging. COMPARISON:  None. FINDINGS: Osseous: Asymmetric positioning in gantry. No gross TMJ dislocation identified. Mild asymmetric widening of the LEFT temporomandibular joint versus RIGHT with minimal anterior translation likely represents a mild degree of subluxation but no TMJ dislocation seen. Bones appear demineralized. Zygomas appear  grossly intact. No definite facial bone fractures identified. Scattered beam hardening artifacts of dental origin. Visualize cervical spine shows mild degenerative disc and facet disease changes. Orbits: Appear grossly intact and clear Sinuses: Grossly clear. Mastoid air cells and middle ear cavities also clear. Soft tissues: No definite soft tissue abnormalities within limitations of motion artifacts. Atherosclerotic calcifications in the carotid systems bilaterally Limited intracranial: No definite acute intracranial abnormalities. IMPRESSION: No TMJ dislocation identified. Mild asymmetry of the temporomandibular joints, slightly wider on the LEFT, probably representing a mild degree of LEFT TMJ subluxation without dislocation. Electronically Signed   By: Lavonia Dana M.D.   On: 04/01/2016 15:53    Procedures Reduction of dislocation Date/Time: 04/01/2016 5:10 PM Performed by: Monico Blitz Authorized by: Monico Blitz  Consent: The procedure was performed in an emergent situation. Local anesthesia used: no  Anesthesia: Local anesthesia used: no Patient tolerance: Patient tolerated the procedure well with no immediate complications Comments: Left-sided TMJ dislocation inferior posterior pressure after fentanyl given without complication.    (including critical care time)  Medications Ordered in ED Medications  fentaNYL (SUBLIMAZE) injection 50 mcg (50 mcg Intravenous Given 04/01/16 1320)     Initial Impression / Assessment and Plan / ED Course  I have reviewed the triage vital signs and the nursing notes.  Pertinent labs & imaging results that were available during my care of the patient were reviewed by me and considered in my medical decision making (see chart for details).     Vitals:   04/01/16 1213 04/01/16 1323 04/01/16 1501 04/01/16 1622  BP: 147/64 143/63 154/66 170/77  Pulse: 62 63 60 70  Resp: 18 18 18 18   Temp:    97 F (36.1 C)  TempSrc:    Tympanic  SpO2:  100% 97% 98% 97%  Weight:        Medications  fentaNYL (SUBLIMAZE) injection 50 mcg (50 mcg Intravenous Given 04/01/16 1320)    Yvonne Buck is 83 y.o. female presenting with Left TMJ dislocation reduced manually. Attending recommends postreduction imaging, CT with a mild left-sided subluxation. Patient discharged back to SNF.  Evaluation does not show pathology that would require ongoing emergent intervention or inpatient treatment. Pt is hemodynamically stable and mentating appropriately. Discussed findings and plan with patient/guardian, who agrees with care plan. All questions answered. Return precautions discussed and outpatient follow up given.      Final Clinical Impressions(s) / ED Diagnoses   Final diagnoses:  Jaw dislocation    New Prescriptions New Prescriptions   No  medications on file     Monico Blitz, PA-C 04/01/16 1712    Julianne Rice, MD 04/06/16 1530

## 2016-04-01 NOTE — ED Notes (Signed)
Bed: WA19 Expected date:  Expected time:  Means of arrival:  Comments: 

## 2016-04-01 NOTE — ED Notes (Signed)
Bed: [facility]D Expected date:  Expected time:  Means of arrival:  Comments: 

## 2016-04-01 NOTE — ED Notes (Signed)
Patient transported to CT 

## 2016-04-01 NOTE — ED Notes (Signed)
PTAR at bedside to transport patient 

## 2016-04-01 NOTE — ED Triage Notes (Signed)
Per EMS pt from [facility] sent for evaluation of jaw dislocation; hx of same.

## 2016-04-01 NOTE — Discharge Instructions (Signed)
Please follow with your primary care doctor in the next 2 days for a check-up. They must obtain records for further management.  ° °Do not hesitate to return to the Emergency Department for any new, worsening or concerning symptoms.  ° °

## 2016-04-06 LAB — TSH: TSH: 0.3 u[IU]/mL — AB (ref 0.41–5.90)

## 2016-04-06 LAB — LIPID PANEL
CHOLESTEROL: 123 mg/dL (ref 0–200)
HDL: 39 mg/dL (ref 35–70)
LDL Cholesterol: 60 mg/dL
TRIGLYCERIDES: 116 mg/dL (ref 40–160)

## 2016-04-07 ENCOUNTER — Non-Acute Institutional Stay (SKILLED_NURSING_FACILITY): Payer: Medicare Other | Admitting: Family

## 2016-04-07 ENCOUNTER — Encounter: Payer: Self-pay | Admitting: Family

## 2016-04-07 DIAGNOSIS — E039 Hypothyroidism, unspecified: Secondary | ICD-10-CM

## 2016-04-07 DIAGNOSIS — E782 Mixed hyperlipidemia: Secondary | ICD-10-CM

## 2016-04-07 DIAGNOSIS — I5032 Chronic diastolic (congestive) heart failure: Secondary | ICD-10-CM | POA: Diagnosis not present

## 2016-04-07 DIAGNOSIS — S0300XD Dislocation of jaw, unspecified side, subsequent encounter: Secondary | ICD-10-CM

## 2016-04-07 DIAGNOSIS — I110 Hypertensive heart disease with heart failure: Secondary | ICD-10-CM

## 2016-04-07 DIAGNOSIS — I509 Heart failure, unspecified: Secondary | ICD-10-CM

## 2016-04-07 NOTE — Progress Notes (Signed)
Location:  [facility] Room Number: 1005-B Place of Service:  SNF (31) Provider: Tymara Saur FNP-C   Blanchie Serve, MD  Patient Care Team: Blanchie Serve, MD as PCP - General (Internal Medicine) Gerlene Fee, NP as Nurse Practitioner (Nurse Practitioner)  Extended Emergency Contact Information Primary Emergency Contact: [facility] Address: [address] [place], [place] [address] Yvonne [place] of [place] Phone: [phone] Mobile Phone: [phone] Relation: Daughter Secondary Emergency Contact: Rodriguez,Jorge Address: [address]          [place], [place] Mobile Phone: [phone] Relation: Yolanda Bonine  Code Status:  DNR  Goals of care: Advanced Directive information Advanced Directives 04/01/2016  Does Patient Have a Medical Advance Directive? No  Type of Advance Directive -  Does patient want to make changes to medical advance directive? -  Copy of [place] in Chart? -  Would patient like information on creating a medical advance directive? -  Pre-existing out of facility DNR order (yellow form or pink MOST form) -     Chief Complaint  Patient presents with  . Medical Management of Chronic Issues    HPI:  Pt is a 83 y.o. female seen today at [facility] for an acute visit for  Evaluation of cough. She has a medical history of HTN, CHF, Sick sinus syndrome, Afib, Dementia with behavioral disturbance, TMJ, Depression among other conditions. She is seen in her room today.She has been up on her geri-chair wheels around in the dinning area. She was evaluated in the ED 04/01/2016 for her chronic lower jaw dislocation. Patient unable to provide HPI and ROS due to dementia but seems to be pain free. Facility Nurse reports no new concerns. Her recent lab results 04/06/2016 TSH level was 0.30   Past Medical History:  Diagnosis Date  . Anxiety     . Asthma   . Chronic kidney disease   . Depression   . Fracture of greater trochanter of left femur ([place]) 04/02/2013  . Frequent falls   . Hip fracture, left ([place]) 04/02/2013  . Hyperlipidemia   . Hypertension   . Hypothyroidism   . Osteoporosis 06/23/2013  . Pacemaker   . Paroxysmal atrial fibrillation (HCC)    chads2vasc score is at least 6.  She is not a candidate for anticoagulation due to falls.  Her AF burden is low.  . Pelvic fracture ([place]) 06/23/2013  . Rhinitis, allergic 02/26/2014  . Senile osteoporosis 02/26/2014  . Sick sinus syndrome ([place])   . Stroke ([place])   . Tachycardia-bradycardia syndrome [facility])    s/p [employer] PPM implant in [place]  . TMJ (dislocation of temporomandibular joint) 06/16/2014  . Vitamin D deficiency    Past Surgical History:  Procedure Laterality Date  . CHOLECYSTECTOMY    . EP IMPLANTABLE DEVICE N/A 09/03/2015   Gen change with a St Jude Mediacl AssurityMRI DR pacemaker  . GALLBLADDER SURGERY    . PACEMAKER INSERTION  01/04/2005   [employer] [place] PPM 1290 [phone]), GDT [phone] atrial lead and 4457 V lead all implanted in [place]  . TONSILLECTOMY AND ADENOIDECTOMY      Allergies  Allergen Reactions  . Penicillins Other (See Comments)    Red spots, ### Tolerated Rocephin 12/2012 ###, Has patient had a PCN reaction causing immediate rash, facial/tongue/throat swelling, SOB or lightheadedness with hypotension: Unknown Has patient had  a PCN reaction causing severe rash involving mucus membranes or skin necrosis: Unknown Has patient had a PCN reaction that required hospitalization Unknown Has patient had a PCN reaction occurring within the last 10 years: Unknown If all of the above answers are "NO", then may proceed with Cephalosporin u    Allergies as of 04/07/2016      Reactions   Penicillins Other (See Comments)   Red spots, ### Tolerated Rocephin 12/2012 ###, Has patient had a PCN reaction causing immediate rash, facial/tongue/throat  swelling, SOB or lightheadedness with hypotension: Unknown Has patient had a PCN reaction causing severe rash involving mucus membranes or skin necrosis: Unknown Has patient had a PCN reaction that required hospitalization Unknown Has patient had a PCN reaction occurring within the last 10 years: Unknown If all of the above answers are "NO", then may proceed with Cephalosporin u      Medication List       Accurate as of 04/07/16 10:15 PM. Always use your most recent med list.          acetaminophen 500 MG tablet Commonly known as:  TYLENOL Take 1,000 mg by mouth 3 (three) times daily as needed for mild pain or moderate pain.   acetaminophen 325 MG tablet Commonly known as:  TYLENOL Take 650 mg by mouth every 4 (four) hours as needed for mild pain or moderate pain.   amLODipine 5 MG tablet Commonly known as:  NORVASC Take 5 mg by mouth daily.   aspirin EC 81 MG tablet Take 81 mg by mouth at bedtime. for AFIB   cholecalciferol 1000 units tablet Commonly known as:  VITAMIN D Take 1,000 Units by mouth at bedtime. for vitamin D deficiency   Fluticasone-Salmeterol 250-50 MCG/DOSE Aepb Commonly known as:  ADVAIR Inhale 1 puff into the lungs 2 (two) times daily. for SOB/Wheeze. Rinse mouth after use.   HYDROcodone-acetaminophen 5-325 MG tablet Commonly known as:  NORCO/VICODIN Take 1 tablet by mouth daily as needed for moderate pain.   isosorbide mononitrate 30 MG 24 hr tablet Commonly known as:  IMDUR Take 30 mg by mouth every morning. for hypertension   losartan 100 MG tablet Commonly known as:  COZAAR Take 100 mg by mouth daily.   metoprolol 50 MG tablet Commonly known as:  LOPRESSOR Take 50 mg by mouth 2 (two) times daily.   mirtazapine 7.5 MG tablet Commonly known as:  REMERON Take 7.5 mg by mouth at bedtime.   montelukast 10 MG tablet Commonly known as:  SINGULAIR Take 10 mg by mouth at bedtime. for allergies   NAMENDA XR 28 MG Cp24 24 hr capsule Generic  drug:  memantine Take 28 mg by mouth daily.   OXYGEN Inhale 2 mLs into the lungs as needed (to keep sats above 90%).   potassium chloride 10 MEQ tablet Commonly known as:  K-DUR,KLOR-CON Take 10 mEq by mouth daily.   simvastatin 10 MG tablet Commonly known as:  ZOCOR Take 30 mg by mouth at bedtime.   UNABLE TO FIND Take 90 mLs by mouth 2 (two) times daily. Med Name: Med Pass       Review of Systems  Unable to perform ROS: Dementia    Immunization History  Administered Date(s) Administered  . Influenza-Unspecified 11/28/2013, 12/03/2014  . PPD Test 01/03/2013, 01/18/2013, 04/19/2013, 06/25/2014, 10/29/2014  . Pneumococcal-Unspecified 02/01/2013   Pertinent  Health Maintenance Due  Topic Date Due  . DEXA SCAN  10/04/1996  . PNA vac Low Risk Adult (2 of  2 - PCV13) 02/01/2014  . INFLUENZA VACCINE  02/21/2017 (Originally 09/22/2015)   Fall Risk  12/10/2010  Risk for fall due to : History of fall(s);Impaired balance/gait    Vitals:   04/07/16 1408  BP: 126/80  Pulse: 76  Resp: 18  Temp: 97.9 F (36.6 C)  TempSrc: Oral  SpO2: 98%  Weight: 99 lb 4.8 oz (45 kg)  Height: 5\' 5"  (1.651 m)   Body mass index is 16.52 kg/m. Physical Exam  Constitutional: She appears well-developed and well-nourished. No distress.  Nonverbal during visit but has good eye contact.   HENT:  Head: Normocephalic.  Mouth/Throat: Oropharynx is clear and moist. No oropharyngeal exudate.  Eyes: Conjunctivae and EOM are normal. Pupils are equal, round, and reactive to light. Right eye exhibits no discharge. Left eye exhibits no discharge. No scleral icterus.  Neck: Neck supple. No JVD present.  Cardiovascular: Normal rate, regular rhythm, normal heart sounds and intact distal pulses.  Exam reveals no gallop and no friction rub.   No murmur heard. Pulmonary/Chest: Effort normal and breath sounds normal. No respiratory distress. She has no wheezes.  Abdominal: Soft. Bowel sounds are normal. She  exhibits no distension. There is no tenderness. There is no rebound and no guarding.  Genitourinary:  Genitourinary Comments: Incontinent   Musculoskeletal: She exhibits no edema, tenderness or deformity.  Moves x 4 extremities.   Lymphadenopathy:    She has no cervical adenopathy.  Neurological: She is alert.  nonverbal  Skin: Skin is warm and dry. No rash noted. No erythema. No pallor.  Psychiatric: She has a normal mood and affect.    Labs reviewed:  Recent Labs  05/23/15 1441  09/03/15 1230 09/03/15 1948 10/30/15 11/05/15 02/20/16  NA 140  < > 141 136 143 142 138  K 4.8  < > 3.6 3.4* 4.0 4.2 4.3  CL 103  --  102 102  --   --   --   CO2 30  --  30 28  --   --   --   GLUCOSE 116*  --  112* 97  --   --   --   BUN 20  < > 15 11 15 18 15   CREATININE 0.75  < > 0.58 0.51 0.5 0.6 0.6  CALCIUM 9.4  --  9.6 8.8*  --   --   --   < > = values in this interval not displayed.  Recent Labs  05/23/15 1441 06/02/15 10/30/15 11/05/15  AST 21 14 13 15   ALT 29 13 18 22   ALKPHOS 77 75 76 70  BILITOT 1.0  --   --   --   PROT 6.5  --   --   --   ALBUMIN 3.1*  --   --   --     Recent Labs  05/23/15 1441  09/03/15 1230 09/03/15 1948 10/30/15 11/05/15 02/20/16  WBC 8.7  < > 9.5 8.1 8.6 8.9 8.8  NEUTROABS 4.5  --   --  4.5  --   --   --   HGB 15.1*  < > 16.5* 14.6 14.1 12.9 12.8  HCT 46.7*  < > 52.9* 46.2* 45 41 40  MCV 89.6  --  91.4 89.9  --   --   --   PLT 322  < > 292 280 277 298 301  < > = values in this interval not displayed. Lab Results  Component Value Date   TSH 0.30 (A) 04/06/2016  Lab Results  Component Value Date   HGBA1C 6.1 03/31/2015   Lab Results  Component Value Date   CHOL 123 04/06/2016   HDL 39 04/06/2016   LDLCALC 60 04/06/2016   LDLDIRECT 83 12/10/2010   TRIG 116 04/06/2016   Assessment/Plan  Hypothyroidism  Recent TSH level 0.30 will recheck T3 and T4 if needed.currently on hospice service.   HTN B/p stable. Continue on Metoprolol,  amlodipine,cozaar and Imdur. Monitor BMP.    CHF Appears compensated. Continue on Metoprolol, amlodipine,cozaar and Imdur.monitor weight.   Hyperlipidemia   continue on simvastatin.   TMJ dislocation  Had recent episode 04/01/2016 was evaluated in the ED. Continue to monitor.   Family/ staff Communication: Reviewed plan of care with patient, Patient's daughter and facility Nurse supervisor.   Labs/tests ordered:   Stat CXR Pa/Lat rule out PNA

## 2016-04-14 ENCOUNTER — Encounter: Payer: Self-pay | Admitting: Cardiology

## 2016-04-16 ENCOUNTER — Encounter (HOSPITAL_COMMUNITY): Payer: Self-pay | Admitting: Emergency Medicine

## 2016-04-16 ENCOUNTER — Emergency Department (HOSPITAL_COMMUNITY)
Admission: EM | Admit: 2016-04-16 | Discharge: 2016-04-16 | Disposition: A | Discharge status: Home / Self Care | Attending: Emergency Medicine | Admitting: Emergency Medicine

## 2016-04-16 DIAGNOSIS — X58XXXA Exposure to other specified factors, initial encounter: Secondary | ICD-10-CM | POA: Insufficient documentation

## 2016-04-16 DIAGNOSIS — S0300XA Dislocation of jaw, unspecified side, initial encounter: Secondary | ICD-10-CM | POA: Insufficient documentation

## 2016-04-16 DIAGNOSIS — I5032 Chronic diastolic (congestive) heart failure: Secondary | ICD-10-CM | POA: Insufficient documentation

## 2016-04-16 DIAGNOSIS — Y999 Unspecified external cause status: Secondary | ICD-10-CM | POA: Insufficient documentation

## 2016-04-16 DIAGNOSIS — Z79899 Other long term (current) drug therapy: Secondary | ICD-10-CM | POA: Diagnosis not present

## 2016-04-16 DIAGNOSIS — Z7982 Long term (current) use of aspirin: Secondary | ICD-10-CM | POA: Diagnosis not present

## 2016-04-16 DIAGNOSIS — Y939 Activity, unspecified: Secondary | ICD-10-CM | POA: Diagnosis not present

## 2016-04-16 DIAGNOSIS — I130 Hypertensive heart and chronic kidney disease with heart failure and stage 1 through stage 4 chronic kidney disease, or unspecified chronic kidney disease: Secondary | ICD-10-CM | POA: Insufficient documentation

## 2016-04-16 DIAGNOSIS — Z8673 Personal history of transient ischemic attack (TIA), and cerebral infarction without residual deficits: Secondary | ICD-10-CM | POA: Insufficient documentation

## 2016-04-16 DIAGNOSIS — E039 Hypothyroidism, unspecified: Secondary | ICD-10-CM | POA: Insufficient documentation

## 2016-04-16 DIAGNOSIS — N189 Chronic kidney disease, unspecified: Secondary | ICD-10-CM | POA: Diagnosis not present

## 2016-04-16 DIAGNOSIS — Y929 Unspecified place or not applicable: Secondary | ICD-10-CM | POA: Diagnosis not present

## 2016-04-16 DIAGNOSIS — Z950 Presence of cardiac pacemaker: Secondary | ICD-10-CM | POA: Diagnosis not present

## 2016-04-16 NOTE — ED Triage Notes (Signed)
Pt has reoccurring jaw displacement associated with eating crackers verbalized by ems.  Pt's 2nd time in february, and 3 times in January, twice in December. Pt is sleeping, ems did attempt to suction. Noting to suction. V/s include 132/84, hr 67, rr18, sterile water neb 100. Pt comes from [facility], off [address]. gcs 9 ( non-verbal baseline, open eyes to verbal. DNR and allergies to penicillin.

## 2016-04-16 NOTE — ED Provider Notes (Signed)
[facility] DEPT Provider Note   CSN: [id number] Arrival date & time: 04/16/16  0112   By signing my name below, I, Soijett Blue, attest that this documentation has been prepared under the direction and in the presence of Varney Biles, MD. Electronically Signed: Soijett Blue, ED Scribe. 04/16/16. 2:06 AM.  History   Chief Complaint Chief Complaint  Patient presents with  . Jaw Pain    dislocates jaw eating      LEVEL 5 CAVEAT: PT NONVERBAL AT BASELINE  HPI  LEVEL 5 CAVEAT FOR ALTERED MENTAL STATUS Yvonne Buck is a 83 y.o. female with a PMHx of TMJ, HTN, who presents to the Emergency Department brought in by EMS complaining of recurrent jaw pain onset this evening PTA. EMS notes that the pt experienced jaw displacement s/p eating crackers tonight. EMS reports that the pt has a hx of jaw dislocation that she has been evaluated multiple times for. EMS denies giving the pt any medications for her symptoms. EMS denies any other symptoms.  The history is provided by the EMS personnel and the nursing home. No language interpreter was used.    Past Medical History:  Diagnosis Date  . Anxiety   . Asthma   . Chronic kidney disease   . Depression   . Fracture of greater trochanter of left femur ([place]) 04/02/2013  . Frequent falls   . Hip fracture, left ([place]) 04/02/2013  . Hyperlipidemia   . Hypertension   . Hypothyroidism   . Osteoporosis 06/23/2013  . Pacemaker   . Paroxysmal atrial fibrillation (H[facility])    chads2vasc score is at least 6.  She is not a candidate for anticoagulation due to falls.  Her AF burden is low.  . Pelvic fracture ([facility]) 06/23/2013  . Rhinitis, allergic 02/26/2014  . Senile osteoporosis 02/26/2014  . Sick sinus syndrome ([place])   . Stroke ([place])   . Tachycardia-bradycardia syndrome [facility])    s/p [employer] PPM implant in [place]  . TMJ (dislocation of temporomandibular joint) 06/16/2014  . Vitamin D deficiency     Patient Active Problem List   Diagnosis Date  Noted  . Benign hypertensive heart disease with CHF (congestive heart failure) ([place]) 01/18/2016  . Dysphagia 06/22/2015  . Dementia with behavioral disturbance 04/23/2015  . Vitamin D deficiency 04/23/2015  . Protein-calorie malnutrition ([place]) 04/23/2015  . Insomnia 12/28/2014  . Chronic diastolic congestive heart failure ([place]) 10/15/2014  . Hypertensive heart and renal disease 10/15/2014  . Frequent falls 08/07/2014  . Protein-calorie malnutrition, severe ([place]) 06/22/2014  . Weakness 06/17/2014  . Steroid-induced hyperglycemia 06/17/2014  . TMJ (dislocation of temporomandibular joint) 06/16/2014  . Hypokalemia 06/16/2014  . Sick sinus syndrome ([place]) 03/24/2014  . Hyperlipidemia 02/26/2014  . Senile osteoporosis 02/26/2014  . Asthmatic bronchitis 02/26/2014  . Rhinitis, allergic 02/26/2014  . Chronic asthma 06/16/2013  . Depression with anxiety 02/05/2013  . Gait instability 01/14/2013  . Left spastic hemiparesis ([place]) 11/16/2012  . Tachycardia-bradycardia syndrome ([place]) 01/06/2011  . Atrial fibrillation ([place]) 01/06/2011  . Hypothyroidism 12/10/2010    Past Surgical History:  Procedure Laterality Date  . CHOLECYSTECTOMY    . EP IMPLANTABLE DEVICE N/A 09/03/2015   Gen change with a St Jude Mediacl AssurityMRI DR pacemaker  . GALLBLADDER SURGERY    . PACEMAKER INSERTION  01/04/2005   [employer] Insignia PPM [phone]), GDT [phone] atrial lead and 4457 V lead all implanted in [place]      OB History  No data available       Home Medications    Prior to Admission medications   Medication Sig Start Date End Date Taking? Authorizing Provider  acetaminophen (TYLENOL) 325 MG tablet Take 650 mg by mouth every 4 (four) hours as needed for mild pain or moderate pain.    Historical Provider, MD  acetaminophen (TYLENOL) 500 MG tablet Take 1,000 mg by mouth 3 (three) times daily as needed for mild pain or moderate pain.     Historical  Provider, MD  amLODipine (NORVASC) 5 MG tablet Take 5 mg by mouth daily.  08/21/14   Historical Provider, MD  aspirin EC 81 MG tablet Take 81 mg by mouth at bedtime. for AFIB    Historical Provider, MD  cholecalciferol (VITAMIN D) 1000 UNITS tablet Take 1,000 Units by mouth at bedtime. for vitamin D deficiency    Historical Provider, MD  Fluticasone-Salmeterol (ADVAIR) 250-50 MCG/DOSE AEPB Inhale 1 puff into the lungs 2 (two) times daily. for SOB/Wheeze. Rinse mouth after use.    Historical Provider, MD  HYDROcodone-acetaminophen (NORCO/VICODIN) 5-325 MG tablet Take 1 tablet by mouth daily as needed for moderate pain.     Historical Provider, MD  isosorbide mononitrate (IMDUR) 30 MG 24 hr tablet Take 30 mg by mouth every morning. for hypertension    Historical Provider, MD  losartan (COZAAR) 100 MG tablet Take 100 mg by mouth daily.     Historical Provider, MD  memantine (NAMENDA XR) 28 MG CP24 24 hr capsule Take 28 mg by mouth daily.    Historical Provider, MD  metoprolol (LOPRESSOR) 50 MG tablet Take 50 mg by mouth 2 (two) times daily.     Historical Provider, MD  mirtazapine (REMERON) 7.5 MG tablet Take 7.5 mg by mouth at bedtime.    Historical Provider, MD  montelukast (SINGULAIR) 10 MG tablet Take 10 mg by mouth at bedtime. for allergies    Historical Provider, MD  OXYGEN Inhale 2 mLs into the lungs as needed (to keep sats above 90%).     Historical Provider, MD  potassium chloride (K-DUR,KLOR-CON) 10 MEQ tablet Take 10 mEq by mouth daily.    Historical Provider, MD  simvastatin (ZOCOR) 10 MG tablet Take 30 mg by mouth at bedtime.  04/25/15   Historical Provider, MD  UNABLE TO FIND Take 90 mLs by mouth 2 (two) times daily. Med Name: Med Pass    Historical Provider, MD    Family History Family History  Problem Relation Age of Onset  . Diabetes Mother   . High blood pressure Mother   . Heart Problems Father     Social History Social History  Substance Use Topics  . Smoking status: Never  Smoker  . Smokeless tobacco: Never Used  . Alcohol use No     Allergies   Penicillins   Review of Systems Review of Systems  Unable to perform ROS: Patient nonverbal     Physical Exam Updated Vital Signs BP 147/64 (BP Location: Left Arm)   Pulse (!) 59   Temp 97.5 F (36.4 C) (Oral)   Resp 22   SpO2 94%   Physical Exam  Constitutional: She is oriented to person, place, and time. She appears well-developed and well-nourished. No distress.  HENT:  Head: Normocephalic and atraumatic.  Pt's mouth is open  Eyes: EOM are normal.  Neck: Neck supple.  Cardiovascular: Normal rate.   Pulmonary/Chest: Effort normal. No respiratory distress.  Abdominal: She exhibits no distension.  Musculoskeletal: Normal range of  motion.  Neurological: She is alert and oriented to person, place, and time.  Skin: Skin is warm and dry.  Psychiatric: She has a normal mood and affect. Her behavior is normal.  Nursing note and vitals reviewed.    ED Treatments / Results  DIAGNOSTIC STUDIES: Oxygen Saturation is 93% on RA, low by my interpretation.    COORDINATION OF CARE: 2:06 AM Discussed treatment plan with pt Power of Health at bedside which includes jaw reduction and pt Power of Health agreed to plan.  1:55 AM- Spoke with pt Power of Health, Shirlee Latch who agrees with jaw reduction  Procedures Reduction of dislocation Date/Time: 04/16/2016 2:31 AM Performed by: Varney Biles Authorized by: Varney Biles  Consent: Verbal consent obtained. Risks and benefits: risks, benefits and alternatives were discussed Consent given by: guardian Patient understanding: patient states understanding of the procedure being performed Patient identity confirmed: arm band Time out: Immediately prior to procedure a "time out" was called to verify the correct patient, procedure, equipment, support staff and site/side marked as required. Local anesthesia used: no  Anesthesia: Local anesthesia  used: no  Sedation: Patient sedated: no Comments: Dislocated jaw was reduced without any difficulty    (including critical care time)  Medications Ordered in ED Medications - No data to display   Initial Impression / Assessment and Plan / ED Course  I have reviewed the triage vital signs and the nursing notes.  Pt comes in with dislocated jaw, which we reduced in the ER. Pt's daughter, who is POA gave the consent. Dressing placed around her jaw. Alignment now appears grossly normal.   Final Clinical Impressions(s) / ED Diagnoses   Final diagnoses:  Jaw dislocation, initial encounter    New Prescriptions New Prescriptions   No medications on file   I personally performed the services described in this documentation, which was scribed in my presence. The recorded information has been reviewed and is accurate.     Varney Biles, MD 04/16/16 [phone]

## 2016-04-16 NOTE — Discharge Instructions (Signed)
Jaw was reduced. Please have her see the ENT doctor if the problem is recurrent.

## 2016-04-16 NOTE — ED Notes (Signed)
Pt is hospice pt of [facility] [phone]

## 2016-04-16 NOTE — ED Notes (Signed)
Called [facility] for provider  [place] report to nurse pt d/c to [facility]   Called [facility] pt on list for pick up back to facility

## 2016-04-16 NOTE — ED Notes (Signed)
Bed: [id number] Expected date:  Expected time:  Means of arrival:  Comments: 83 yr old dislocated jaw

## 2016-04-18 ENCOUNTER — Emergency Department (HOSPITAL_COMMUNITY)
Admission: EM | Admit: 2016-04-18 | Discharge: 2016-04-18 | Disposition: A | Discharge status: Home / Self Care | Attending: Emergency Medicine | Admitting: Emergency Medicine

## 2016-04-18 ENCOUNTER — Encounter (HOSPITAL_COMMUNITY): Payer: Self-pay | Admitting: Emergency Medicine

## 2016-04-18 DIAGNOSIS — I130 Hypertensive heart and chronic kidney disease with heart failure and stage 1 through stage 4 chronic kidney disease, or unspecified chronic kidney disease: Secondary | ICD-10-CM | POA: Diagnosis not present

## 2016-04-18 DIAGNOSIS — Y939 Activity, unspecified: Secondary | ICD-10-CM | POA: Insufficient documentation

## 2016-04-18 DIAGNOSIS — S0300XA Dislocation of jaw, unspecified side, initial encounter: Secondary | ICD-10-CM

## 2016-04-18 DIAGNOSIS — S0301XA Dislocation of jaw, right side, initial encounter: Secondary | ICD-10-CM | POA: Diagnosis not present

## 2016-04-18 DIAGNOSIS — Z8673 Personal history of transient ischemic attack (TIA), and cerebral infarction without residual deficits: Secondary | ICD-10-CM | POA: Diagnosis not present

## 2016-04-18 DIAGNOSIS — E039 Hypothyroidism, unspecified: Secondary | ICD-10-CM | POA: Insufficient documentation

## 2016-04-18 DIAGNOSIS — Z950 Presence of cardiac pacemaker: Secondary | ICD-10-CM | POA: Insufficient documentation

## 2016-04-18 DIAGNOSIS — N189 Chronic kidney disease, unspecified: Secondary | ICD-10-CM | POA: Diagnosis not present

## 2016-04-18 DIAGNOSIS — Z79899 Other long term (current) drug therapy: Secondary | ICD-10-CM | POA: Diagnosis not present

## 2016-04-18 DIAGNOSIS — I5032 Chronic diastolic (congestive) heart failure: Secondary | ICD-10-CM | POA: Diagnosis not present

## 2016-04-18 DIAGNOSIS — X58XXXA Exposure to other specified factors, initial encounter: Secondary | ICD-10-CM | POA: Insufficient documentation

## 2016-04-18 DIAGNOSIS — J45909 Unspecified asthma, uncomplicated: Secondary | ICD-10-CM | POA: Insufficient documentation

## 2016-04-18 DIAGNOSIS — Y92129 Unspecified place in nursing home as the place of occurrence of the external cause: Secondary | ICD-10-CM | POA: Insufficient documentation

## 2016-04-18 DIAGNOSIS — Z7982 Long term (current) use of aspirin: Secondary | ICD-10-CM | POA: Insufficient documentation

## 2016-04-18 DIAGNOSIS — Y999 Unspecified external cause status: Secondary | ICD-10-CM | POA: Diagnosis not present

## 2016-04-18 NOTE — Discharge Instructions (Signed)
You were seen today for jaw pain. You have recurrent jaw dislocation. At this time, the only definitive treatment would be surgery but this is likely not in line with goals of care. Recommend soft diet and avoiding wide opening of the mouth. Follow-up with primary physician.

## 2016-04-18 NOTE — ED Notes (Signed)
Leaving with [facility] at this time. 

## 2016-04-18 NOTE — ED Provider Notes (Signed)
[facility] DEPT Provider Note   CSN: [id number] Arrival date & time: 04/18/16  [id number]  By signing my name below, I, Oleh Genin, attest that this documentation has been prepared under the direction and in the presence of Merryl Hacker, MD. Electronically Signed: Oleh Genin, Scribe. 04/18/16. 1:36 AM.   History   Chief Complaint Chief Complaint  Patient presents with  . Jaw Pain   LEVEL 5 CAVEAT DUE TO NON VERBAL STATUS  HPI Yvonne Buck is a 83 y.o. non-verbal female with history of TMJ dysfunction, CVA, paroxysmal A-fib not anticoagulated, and sick sinus syndrome s/p PPM who presents to the ED for evaluation of a recurrent jaw dislocation. Nursing staff at the patient's skilled nursing facility state that the patient dislocated her jaw while eating crackers. EMS did not provide any pre-hospital therapy. Medic denies any other concerns from nursing staff.   The history is provided by the nursing home and the EMS personnel. The history is limited by the condition of the patient.    Past Medical History:  Diagnosis Date  . Anxiety   . Asthma   . Chronic kidney disease   . Depression   . Fracture of greater trochanter of left femur ([place]) 04/02/2013  . Frequent falls   . Hip fracture, left ([place]) 04/02/2013  . Hyperlipidemia   . Hypertension   . Hypothyroidism   . Osteoporosis 06/23/2013  . Pacemaker   . Paroxysmal atrial fibrillation (H[facility])    chads2vasc score is at least 6.  She is not a candidate for anticoagulation due to falls.  Her AF burden is low.  . Pelvic fracture ([place]) 06/23/2013  . Rhinitis, allergic 02/26/2014  . Senile osteoporosis 02/26/2014  . Sick sinus syndrome ([place])   . Stroke ([place])   . Tachycardia-bradycardia syndrome [facility])    s/p [employer] PPM implant in [place]  . TMJ (dislocation of temporomandibular joint) 06/16/2014  . Vitamin D deficiency     Patient Active Problem List   Diagnosis Date Noted  . Benign hypertensive heart disease with  CHF (congestive heart failure) ([place]) 01/18/2016  . Dysphagia 06/22/2015  . Dementia with behavioral disturbance 04/23/2015  . Vitamin D deficiency 04/23/2015  . Protein-calorie malnutrition ([place]) 04/23/2015  . Insomnia 12/28/2014  . Chronic diastolic congestive heart failure ([place]) 10/15/2014  . Hypertensive heart and renal disease 10/15/2014  . Frequent falls 08/07/2014  . Protein-calorie malnutrition, severe ([place]) 06/22/2014  . Weakness 06/17/2014  . Steroid-induced hyperglycemia 06/17/2014  . TMJ (dislocation of temporomandibular joint) 06/16/2014  . Hypokalemia 06/16/2014  . Sick sinus syndrome ([place]) 03/24/2014  . Hyperlipidemia 02/26/2014  . Senile osteoporosis 02/26/2014  . Asthmatic bronchitis 02/26/2014  . Rhinitis, allergic 02/26/2014  . Chronic asthma 06/16/2013  . Depression with anxiety 02/05/2013  . Gait instability 01/14/2013  . Left spastic hemiparesis ([place]) 11/16/2012  . Tachycardia-bradycardia syndrome ([place]) 01/06/2011  . Atrial fibrillation ([place]) 01/06/2011  . Hypothyroidism 12/10/2010    Past Surgical History:  Procedure Laterality Date  . CHOLECYSTECTOMY    . EP IMPLANTABLE DEVICE N/A 09/03/2015   Gen change with a St Jude Mediacl AssurityMRI DR pacemaker  . GALLBLADDER SURGERY    . PACEMAKER INSERTION  01/04/2005   [employer] Insignia PPM [phone]), GDT [phone] atrial lead and 4457 V lead all implanted in [place]      OB History    No data available       Home Medications    Prior to Admission medications  Medication Sig Start Date End Date Taking? Authorizing Provider  acetaminophen (TYLENOL) 325 MG tablet Take 650 mg by mouth every 4 (four) hours as needed for mild pain or moderate pain.    Historical Provider, MD  acetaminophen (TYLENOL) 500 MG tablet Take 1,000 mg by mouth 3 (three) times daily as needed for mild pain or moderate pain.     Historical Provider, MD  amLODipine (NORVASC) 5 MG tablet Take  5 mg by mouth daily.  08/21/14   Historical Provider, MD  aspirin EC 81 MG tablet Take 81 mg by mouth at bedtime. for AFIB    Historical Provider, MD  cholecalciferol (VITAMIN D) 1000 UNITS tablet Take 1,000 Units by mouth at bedtime. for vitamin D deficiency    Historical Provider, MD  Fluticasone-Salmeterol (ADVAIR) 250-50 MCG/DOSE AEPB Inhale 1 puff into the lungs 2 (two) times daily. for SOB/Wheeze. Rinse mouth after use.    Historical Provider, MD  HYDROcodone-acetaminophen (NORCO/VICODIN) 5-325 MG tablet Take 1 tablet by mouth daily as needed for moderate pain.     Historical Provider, MD  isosorbide mononitrate (IMDUR) 30 MG 24 hr tablet Take 30 mg by mouth every morning. for hypertension    Historical Provider, MD  losartan (COZAAR) 100 MG tablet Take 100 mg by mouth daily.     Historical Provider, MD  memantine (NAMENDA XR) 28 MG CP24 24 hr capsule Take 28 mg by mouth daily.    Historical Provider, MD  metoprolol (LOPRESSOR) 50 MG tablet Take 50 mg by mouth 2 (two) times daily.     Historical Provider, MD  mirtazapine (REMERON) 7.5 MG tablet Take 7.5 mg by mouth at bedtime.    Historical Provider, MD  montelukast (SINGULAIR) 10 MG tablet Take 10 mg by mouth at bedtime. for allergies    Historical Provider, MD  OXYGEN Inhale 2 mLs into the lungs as needed (to keep sats above 90%).     Historical Provider, MD  potassium chloride (K-DUR,KLOR-CON) 10 MEQ tablet Take 10 mEq by mouth daily.    Historical Provider, MD  simvastatin (ZOCOR) 10 MG tablet Take 30 mg by mouth at bedtime.  04/25/15   Historical Provider, MD  UNABLE TO FIND Take 90 mLs by mouth 2 (two) times daily. Med Name: Med Pass    Historical Provider, MD    Family History Family History  Problem Relation Age of Onset  . Diabetes Mother   . High blood pressure Mother   . Heart Problems Father     Social History Social History  Substance Use Topics  . Smoking status: Never Smoker  . Smokeless tobacco: Never Used  . Alcohol  use No     Allergies   Penicillins   Review of Systems Review of Systems  Unable to perform ROS: Patient nonverbal     Physical Exam Updated Vital Signs BP 187/77 (BP Location: Left Arm)   Pulse 60   Temp 97.1 F (36.2 C) (Axillary)   Resp 10   Ht 5\' 5"  (1.651 m)   Wt 99 lb (44.9 kg)   SpO2 98%   BMI 16.47 kg/m   Physical Exam  Constitutional:  Elderly, ill-appearing but nontoxic  HENT:  Head: Normocephalic and atraumatic.  Mucous membranes dry, mouth in a dependent position with apparent right-sided anterior jaw dislocation  Eyes:  2 mm reactive bilaterally  Cardiovascular: Normal rate, regular rhythm and normal heart sounds.   Pulmonary/Chest: Effort normal and breath sounds normal. No respiratory distress. She has no wheezes.  Abdominal: Soft. There is no tenderness.  Neurological: She is alert.  Nonverbal, winces in pain occasionally, follows simple commands  Skin: Skin is warm and dry.  Psychiatric: She has a normal mood and affect.  Nursing note and vitals reviewed.    ED Treatments / Results  DIAGNOSTIC STUDIES:   COORDINATION OF CARE: 1:30 AM Discussed treatment plan with pt at bedside and pt agreed to plan.  Labs (all labs ordered are listed, but only abnormal results are displayed) Labs Reviewed - No data to display  EKG  EKG Interpretation None       Radiology No results found.  Procedures Reduction of dislocation Date/Time: 04/18/2016 2:14 AM Performed by: Merryl Hacker Authorized by: Thayer Jew F  Consent: The procedure was performed in an emergent situation. Verbal consent not obtained. Written consent not obtained. Patient identity confirmed: verbally with patient Time out: Immediately prior to procedure a "time out" was called to verify the correct patient, procedure, equipment, support staff and site/side marked as required. Local anesthesia used: no  Anesthesia: Local anesthesia used: no  Sedation: Patient  sedated: no Patient tolerance: Patient tolerated the procedure well with no immediate complications Comments: Patient with recurrent jaw dislocation. Reduced with external manipulation of the jaw. Patient had multiple recurrent dislocations while in the ED with easy re-reduction. Mouth closes with normal alignment postreduction. Dressing was placed around the head to keep patient's mouth closed.    (including critical care time)  Medications Ordered in ED Medications - No data to display   Initial Impression / Assessment and Plan / ED Course  I have reviewed the triage vital signs and the nursing notes.  Pertinent labs & imaging results that were available during my care of the patient were reviewed by me and considered in my medical decision making (see chart for details).    Patient presents with recurrent jaw dislocation. This is easily reduced but she had multiple dislocations while in the ED with subsequent reductions. A dressing was plates to try to prevent further dislocation. Given her goals of care and hospice status, she is unlikely to be a surgical candidate but I anticipate this to be a recurrent issue as her joint appears extremely lax.  After history, exam, and medical workup I feel the patient has been appropriately medically screened and is safe for discharge home. Pertinent diagnoses were discussed with the patient. Patient was given return precautions.   Final Clinical Impressions(s) / ED Diagnoses   Final diagnoses:  Closed dislocation of jaw, initial encounter    New Prescriptions New Prescriptions   No medications on file   I personally performed the services described in this documentation, which was scribed in my presence. The recorded information has been reviewed and is accurate.    Merryl Hacker, MD 04/18/16 [phone]

## 2016-04-18 NOTE — ED Notes (Signed)
[facility] called -ty 

## 2016-04-18 NOTE — ED Triage Notes (Signed)
Ems pt from [facility] sent over for dislocated jaw, reported to be a frequent problem. Seen last week for the same. Ems gave pt 150mg  total of fentanyl PTA.

## 2016-04-27 ENCOUNTER — Emergency Department (HOSPITAL_COMMUNITY)
Admission: EM | Admit: 2016-04-27 | Discharge: 2016-04-27 | Disposition: A | Discharge status: Home / Self Care | Attending: Emergency Medicine | Admitting: Emergency Medicine

## 2016-04-27 ENCOUNTER — Encounter (HOSPITAL_COMMUNITY): Payer: Self-pay

## 2016-04-27 DIAGNOSIS — Y929 Unspecified place or not applicable: Secondary | ICD-10-CM | POA: Diagnosis not present

## 2016-04-27 DIAGNOSIS — Y999 Unspecified external cause status: Secondary | ICD-10-CM | POA: Diagnosis not present

## 2016-04-27 DIAGNOSIS — I5032 Chronic diastolic (congestive) heart failure: Secondary | ICD-10-CM | POA: Diagnosis not present

## 2016-04-27 DIAGNOSIS — I130 Hypertensive heart and chronic kidney disease with heart failure and stage 1 through stage 4 chronic kidney disease, or unspecified chronic kidney disease: Secondary | ICD-10-CM | POA: Diagnosis not present

## 2016-04-27 DIAGNOSIS — J45909 Unspecified asthma, uncomplicated: Secondary | ICD-10-CM | POA: Insufficient documentation

## 2016-04-27 DIAGNOSIS — Z950 Presence of cardiac pacemaker: Secondary | ICD-10-CM | POA: Diagnosis not present

## 2016-04-27 DIAGNOSIS — S0300XA Dislocation of jaw, unspecified side, initial encounter: Secondary | ICD-10-CM

## 2016-04-27 DIAGNOSIS — Z7982 Long term (current) use of aspirin: Secondary | ICD-10-CM | POA: Diagnosis not present

## 2016-04-27 DIAGNOSIS — Z8673 Personal history of transient ischemic attack (TIA), and cerebral infarction without residual deficits: Secondary | ICD-10-CM | POA: Insufficient documentation

## 2016-04-27 DIAGNOSIS — S0301XA Dislocation of jaw, right side, initial encounter: Secondary | ICD-10-CM | POA: Diagnosis not present

## 2016-04-27 DIAGNOSIS — E039 Hypothyroidism, unspecified: Secondary | ICD-10-CM | POA: Insufficient documentation

## 2016-04-27 DIAGNOSIS — X58XXXA Exposure to other specified factors, initial encounter: Secondary | ICD-10-CM | POA: Diagnosis not present

## 2016-04-27 DIAGNOSIS — N189 Chronic kidney disease, unspecified: Secondary | ICD-10-CM | POA: Insufficient documentation

## 2016-04-27 DIAGNOSIS — Y939 Activity, unspecified: Secondary | ICD-10-CM | POA: Insufficient documentation

## 2016-04-27 NOTE — ED Triage Notes (Signed)
Pt comes via [facility] from [facility] with dislocated jaw, has happened several times in the past, hx of TMJ

## 2016-04-27 NOTE — ED Provider Notes (Signed)
[facility] DEPT Provider Note   CSN: [id number] Arrival date & time: 04/27/16  0516     History   Chief Complaint Chief Complaint  Patient presents with  . Dislocated Jaw    HPI Yvonne Buck is a 83 y.o. female past medical history of recurrent jaw dislocations presenting today with the same. Patient cannot give her own history due to dementia. History from EMS states that she is arriving from [facility] and her right TMJ is dislocated. No further history could be obtained.  HPI  Past Medical History:  Diagnosis Date  . Anxiety   . Asthma   . Chronic kidney disease   . Depression   . Fracture of greater trochanter of left femur ([place]) 04/02/2013  . Frequent falls   . Hip fracture, left ([place]) 04/02/2013  . Hyperlipidemia   . Hypertension   . Hypothyroidism   . Osteoporosis 06/23/2013  . Pacemaker   . Paroxysmal atrial fibrillation (HCC)    chads2vasc score is at least 6.  She is not a candidate for anticoagulation due to falls.  Her AF burden is low.  . Pelvic fracture ([place]) 06/23/2013  . Rhinitis, allergic 02/26/2014  . Senile osteoporosis 02/26/2014  . Sick sinus syndrome ([place])   . Stroke ([place])   . Tachycardia-bradycardia syndrome [facility])    s/p [employer] PPM implant in [place]  . TMJ (dislocation of temporomandibular joint) 06/16/2014  . Vitamin D deficiency     Patient Active Problem List   Diagnosis Date Noted  . Benign hypertensive heart disease with CHF (congestive heart failure) ([place]) 01/18/2016  . Dysphagia 06/22/2015  . Dementia with behavioral disturbance 04/23/2015  . Vitamin D deficiency 04/23/2015  . Protein-calorie malnutrition ([place]) 04/23/2015  . Insomnia 12/28/2014  . Chronic diastolic congestive heart failure ([place]) 10/15/2014  . Hypertensive heart and renal disease 10/15/2014  . Frequent falls 08/07/2014  . Protein-calorie malnutrition, severe ([place]) 06/22/2014  . Weakness 06/17/2014  . Steroid-induced hyperglycemia 06/17/2014  . TMJ  (dislocation of temporomandibular joint) 06/16/2014  . Hypokalemia 06/16/2014  . Sick sinus syndrome ([place]) 03/24/2014  . Hyperlipidemia 02/26/2014  . Senile osteoporosis 02/26/2014  . Asthmatic bronchitis 02/26/2014  . Rhinitis, allergic 02/26/2014  . Chronic asthma 06/16/2013  . Depression with anxiety 02/05/2013  . Gait instability 01/14/2013  . Left spastic hemiparesis ([place]) 11/16/2012  . Tachycardia-bradycardia syndrome ([place]) 01/06/2011  . Atrial fibrillation ([place]) 01/06/2011  . Hypothyroidism 12/10/2010    Past Surgical History:  Procedure Laterality Date  . CHOLECYSTECTOMY    . EP IMPLANTABLE DEVICE N/A 09/03/2015   Gen change with a St Jude Mediacl AssurityMRI DR pacemaker  . GALLBLADDER SURGERY    . PACEMAKER INSERTION  01/04/2005   [employer] [place] PPM [phone]), GDT [id number] atrial lead and 4457 V lead all implanted in [place]      OB History    No data available       Home Medications    Prior to Admission medications   Medication Sig Start Date End Date Taking? Authorizing Provider  acetaminophen (TYLENOL) 325 MG tablet Take 650 mg by mouth every 4 (four) hours as needed for mild pain or moderate pain.    Historical Provider, MD  acetaminophen (TYLENOL) 500 MG tablet Take 1,000 mg by mouth 3 (three) times daily as needed for mild pain or moderate pain.     Historical Provider, MD  amLODipine (NORVASC) 5 MG tablet Take 5 mg by mouth daily.  08/21/14  Historical Provider, MD  aspirin EC 81 MG tablet Take 81 mg by mouth at bedtime. for AFIB    Historical Provider, MD  cholecalciferol (VITAMIN D) 1000 UNITS tablet Take 1,000 Units by mouth at bedtime. for vitamin D deficiency    Historical Provider, MD  Fluticasone-Salmeterol (ADVAIR) 250-50 MCG/DOSE AEPB Inhale 1 puff into the lungs 2 (two) times daily. for SOB/Wheeze. Rinse mouth after use.    Historical Provider, MD  HYDROcodone-acetaminophen (NORCO/VICODIN) 5-325 MG  tablet Take 1 tablet by mouth daily as needed for moderate pain.     Historical Provider, MD  isosorbide mononitrate (IMDUR) 30 MG 24 hr tablet Take 30 mg by mouth every morning. for hypertension    Historical Provider, MD  losartan (COZAAR) 100 MG tablet Take 100 mg by mouth daily.     Historical Provider, MD  memantine (NAMENDA XR) 28 MG CP24 24 hr capsule Take 28 mg by mouth daily.    Historical Provider, MD  metoprolol (LOPRESSOR) 50 MG tablet Take 50 mg by mouth 2 (two) times daily.     Historical Provider, MD  mirtazapine (REMERON) 7.5 MG tablet Take 7.5 mg by mouth at bedtime.    Historical Provider, MD  montelukast (SINGULAIR) 10 MG tablet Take 10 mg by mouth at bedtime. for allergies    Historical Provider, MD  OXYGEN Inhale 2 mLs into the lungs as needed (to keep sats above 90%).     Historical Provider, MD  potassium chloride (K-DUR,KLOR-CON) 10 MEQ tablet Take 10 mEq by mouth daily.    Historical Provider, MD  simvastatin (ZOCOR) 10 MG tablet Take 30 mg by mouth at bedtime.  04/25/15   Historical Provider, MD  UNABLE TO FIND Take 90 mLs by mouth 2 (two) times daily. Med Name: Med Pass    Historical Provider, MD    Family History Family History  Problem Relation Age of Onset  . Diabetes Mother   . High blood pressure Mother   . Heart Problems Father     Social History Social History  Substance Use Topics  . Smoking status: Never Smoker  . Smokeless tobacco: Never Used  . Alcohol use No     Allergies   Penicillins   Review of Systems Review of Systems  Unable to perform ROS: Patient nonverbal     Physical Exam Updated Vital Signs BP 197/86 (BP Location: Left Arm)   Pulse 68   Temp 97.9 F (36.6 C) (Axillary)   Resp 20   SpO2 94%   Physical Exam  Constitutional: She is oriented to person, place, and time. She appears well-developed and well-nourished. No distress.  HENT:  Head: Normocephalic and atraumatic.  Nose: Nose normal.  Mouth/Throat: Oropharynx is  clear and moist. No oropharyngeal exudate.  Mouth is held open and patient cannot close it. There is tenderness over the right TMJ  Eyes: Conjunctivae and EOM are normal. Pupils are equal, round, and reactive to light. No scleral icterus.  Neck: Normal range of motion. Neck supple. No JVD present. No tracheal deviation present. No thyromegaly present.  Cardiovascular: Normal rate, regular rhythm and normal heart sounds.  Exam reveals no gallop and no friction rub.   No murmur heard. Pulmonary/Chest: Effort normal and breath sounds normal. No respiratory distress. She has no wheezes. She exhibits no tenderness.  Abdominal: Soft. Bowel sounds are normal. She exhibits no distension and no mass. There is no tenderness. There is no rebound and no guarding.  Musculoskeletal: Normal range of motion. She exhibits  no edema or tenderness.  Lymphadenopathy:    She has no cervical adenopathy.  Neurological: She is alert and oriented to person, place, and time. No cranial nerve deficit. She exhibits normal muscle tone.  Skin: Skin is warm and dry. No rash noted. No erythema. No pallor.  Nursing note and vitals reviewed.    ED Treatments / Results  Labs (all labs ordered are listed, but only abnormal results are displayed) Labs Reviewed - No data to display  EKG  EKG Interpretation None       Radiology No results found.  Procedures Procedures (including critical care time)  Medications Ordered in ED Medications - No data to display   Initial Impression / Assessment and Plan / ED Course  I have reviewed the triage vital signs and the nursing notes.  Pertinent labs & imaging results that were available during my care of the patient were reviewed by me and considered in my medical decision making (see chart for details).    Patient presents to the emergency department for a jaw dislocation. This will be reduced in the emergency department. She has a history of recurrent dislocations.  She  appears well and in no acute distress. Vital signs were within her normal limits and she is safe for discharge.    Reduction of dislocation Date/Time: 5:33 AM Performed by: Everlene Balls Authorized byEverlene Balls Consent: Verbal consent obtained. Risks and benefits: risks, benefits and alternatives were discussed Consent given by: patient Required items: required blood products, implants, devices, and special equipment available Time out: Immediately prior to procedure a "time out" was called to verify the correct patient, procedure, equipment, support staff and site/side marked as required.   Vitals: Vital signs were monitored during sedation. Patient tolerance: Patient tolerated the procedure well with no immediate complications. Joint: Jaw Reduction technique: external manipulation      Final Clinical Impressions(s) / ED Diagnoses   Final diagnoses:  Dislocation of temporomandibular joint, initial encounter    New Prescriptions New Prescriptions   No medications on file     Everlene Balls, MD 04/27/16 [phone]

## 2016-05-02 ENCOUNTER — Encounter (HOSPITAL_COMMUNITY): Payer: Self-pay | Admitting: *Deleted

## 2016-05-02 ENCOUNTER — Emergency Department (HOSPITAL_COMMUNITY)
Admission: EM | Admit: 2016-05-02 | Discharge: 2016-05-02 | Disposition: A | Discharge status: Home / Self Care | Attending: Emergency Medicine | Admitting: Emergency Medicine

## 2016-05-02 DIAGNOSIS — Y939 Activity, unspecified: Secondary | ICD-10-CM | POA: Diagnosis not present

## 2016-05-02 DIAGNOSIS — X58XXXA Exposure to other specified factors, initial encounter: Secondary | ICD-10-CM | POA: Insufficient documentation

## 2016-05-02 DIAGNOSIS — E039 Hypothyroidism, unspecified: Secondary | ICD-10-CM | POA: Diagnosis not present

## 2016-05-02 DIAGNOSIS — I5032 Chronic diastolic (congestive) heart failure: Secondary | ICD-10-CM | POA: Diagnosis not present

## 2016-05-02 DIAGNOSIS — Z7982 Long term (current) use of aspirin: Secondary | ICD-10-CM | POA: Insufficient documentation

## 2016-05-02 DIAGNOSIS — Y999 Unspecified external cause status: Secondary | ICD-10-CM | POA: Diagnosis not present

## 2016-05-02 DIAGNOSIS — S0303XA Dislocation of jaw, bilateral, initial encounter: Secondary | ICD-10-CM | POA: Diagnosis not present

## 2016-05-02 DIAGNOSIS — J45909 Unspecified asthma, uncomplicated: Secondary | ICD-10-CM | POA: Diagnosis not present

## 2016-05-02 DIAGNOSIS — N189 Chronic kidney disease, unspecified: Secondary | ICD-10-CM | POA: Diagnosis not present

## 2016-05-02 DIAGNOSIS — I130 Hypertensive heart and chronic kidney disease with heart failure and stage 1 through stage 4 chronic kidney disease, or unspecified chronic kidney disease: Secondary | ICD-10-CM | POA: Insufficient documentation

## 2016-05-02 DIAGNOSIS — S0300XA Dislocation of jaw, unspecified side, initial encounter: Secondary | ICD-10-CM | POA: Diagnosis present

## 2016-05-02 DIAGNOSIS — Z8673 Personal history of transient ischemic attack (TIA), and cerebral infarction without residual deficits: Secondary | ICD-10-CM | POA: Insufficient documentation

## 2016-05-02 DIAGNOSIS — Z950 Presence of cardiac pacemaker: Secondary | ICD-10-CM | POA: Diagnosis not present

## 2016-05-02 DIAGNOSIS — Y929 Unspecified place or not applicable: Secondary | ICD-10-CM | POA: Insufficient documentation

## 2016-05-02 MED ORDER — PROPOFOL 10 MG/ML IV BOLUS
0.5000 mg/kg | Freq: Once | INTRAVENOUS | Status: DC
Start: 2016-05-02 — End: 2016-05-02
  Filled 2016-05-02: qty 20

## 2016-05-02 MED ORDER — PROPOFOL 10 MG/ML IV BOLUS
INTRAVENOUS | Status: AC | PRN
  Administered 2016-05-02: 22 mg via INTRAVENOUS

## 2016-05-02 NOTE — Discharge Instructions (Signed)
Return for worsening symptoms, including redislocation, not eating or drinking or any other concerning symptoms

## 2016-05-02 NOTE — ED Notes (Signed)
Bed: WA17 Expected date:  Expected time:  Means of arrival: Ambulance Comments: EMS: jaw dislocated

## 2016-05-02 NOTE — ED Triage Notes (Signed)
Pt bib EMS from [facility] and presents with a dislocated jaw.  Pt has had this happen several before.  According to staff at facility it happens about once a week.  Hx Dementia, TMJ. Pt a/o per her normal and is nonverbal.

## 2016-05-02 NOTE — ED Provider Notes (Signed)
[facility] DEPT Provider Note   CSN: [id number] Arrival date & time: 05/02/16  1006     History   Chief Complaint Chief Complaint  Patient presents with  . Dislocation    Jaw    HPI Yvonne Buck is a 83 y.o. female.  The history is provided by the EMS personnel.  Level V caveat due to dementia.  H/o of recurrent jaw dislocation. Brought in by EMS for jaw dislocation today. Happens about once weekly for patient, per [facility]. Baseline she is non-verbal  Past Medical History:  Diagnosis Date  . Anxiety   . Asthma   . Chronic kidney disease   . Depression   . Fracture of greater trochanter of left femur ([place]) 04/02/2013  . Frequent falls   . Hip fracture, left ([place]) 04/02/2013  . Hyperlipidemia   . Hypertension   . Hypothyroidism   . Osteoporosis 06/23/2013  . Pacemaker   . Paroxysmal atrial fibrillation (HCC)    chads2vasc score is at least 6.  She is not a candidate for anticoagulation due to falls.  Her AF burden is low.  . Pelvic fracture ([place]) 06/23/2013  . Rhinitis, allergic 02/26/2014  . Senile osteoporosis 02/26/2014  . Sick sinus syndrome ([place])   . Stroke ([place])   . Tachycardia-bradycardia syndrome [facility])    s/p [employer] PPM implant in [place]  . TMJ (dislocation of temporomandibular joint) 06/16/2014  . Vitamin D deficiency     Patient Active Problem List   Diagnosis Date Noted  . Benign hypertensive heart disease with CHF (congestive heart failure) ([place]) 01/18/2016  . Dysphagia 06/22/2015  . Dementia with behavioral disturbance 04/23/2015  . Vitamin D deficiency 04/23/2015  . Protein-calorie malnutrition ([place]) 04/23/2015  . Insomnia 12/28/2014  . Chronic diastolic congestive heart failure ([place]) 10/15/2014  . Hypertensive heart and renal disease 10/15/2014  . Frequent falls 08/07/2014  . Protein-calorie malnutrition, severe ([place]) 06/22/2014  . Weakness 06/17/2014  . Steroid-induced hyperglycemia 06/17/2014  . TMJ (dislocation of  temporomandibular joint) 06/16/2014  . Hypokalemia 06/16/2014  . Sick sinus syndrome ([place]) 03/24/2014  . Hyperlipidemia 02/26/2014  . Senile osteoporosis 02/26/2014  . Asthmatic bronchitis 02/26/2014  . Rhinitis, allergic 02/26/2014  . Chronic asthma 06/16/2013  . Depression with anxiety 02/05/2013  . Gait instability 01/14/2013  . Left spastic hemiparesis ([place]) 11/16/2012  . Tachycardia-bradycardia syndrome ([place]) 01/06/2011  . Atrial fibrillation ([place]) 01/06/2011  . Hypothyroidism 12/10/2010    Past Surgical History:  Procedure Laterality Date  . CHOLECYSTECTOMY    . EP IMPLANTABLE DEVICE N/A 09/03/2015   Gen change with a St Jude Mediacl AssurityMRI DR pacemaker  . GALLBLADDER SURGERY    . PACEMAKER INSERTION  01/04/2005   [employer] [place] PPM [phone]), GDT [id number] atrial lead and 4457 V lead all implanted in [place]      OB History    No data available       Home Medications    Prior to Admission medications   Medication Sig Start Date End Date Taking? Authorizing Provider  acetaminophen (TYLENOL) 325 MG tablet Take 650 mg by mouth every 4 (four) hours as needed for mild pain or moderate pain.    Historical Provider, MD  acetaminophen (TYLENOL) 500 MG tablet Take 1,000 mg by mouth 3 (three) times daily as needed for mild pain or moderate pain.     Historical Provider, MD  amLODipine (NORVASC) 5 MG tablet Take 5 mg by mouth daily.  08/21/14  Historical Provider, MD  aspirin EC 81 MG tablet Take 81 mg by mouth at bedtime. for AFIB    Historical Provider, MD  cholecalciferol (VITAMIN D) 1000 UNITS tablet Take 1,000 Units by mouth at bedtime. for vitamin D deficiency    Historical Provider, MD  Fluticasone-Salmeterol (ADVAIR) 250-50 MCG/DOSE AEPB Inhale 1 puff into the lungs 2 (two) times daily. for SOB/Wheeze. Rinse mouth after use.    Historical Provider, MD  HYDROcodone-acetaminophen (NORCO/VICODIN) 5-325 MG tablet Take 1 tablet  by mouth daily as needed for moderate pain.     Historical Provider, MD  isosorbide mononitrate (IMDUR) 30 MG 24 hr tablet Take 30 mg by mouth every morning. for hypertension    Historical Provider, MD  losartan (COZAAR) 100 MG tablet Take 100 mg by mouth daily.     Historical Provider, MD  memantine (NAMENDA XR) 28 MG CP24 24 hr capsule Take 28 mg by mouth daily.    Historical Provider, MD  metoprolol (LOPRESSOR) 50 MG tablet Take 50 mg by mouth 2 (two) times daily.     Historical Provider, MD  mirtazapine (REMERON) 7.5 MG tablet Take 7.5 mg by mouth at bedtime.    Historical Provider, MD  montelukast (SINGULAIR) 10 MG tablet Take 10 mg by mouth at bedtime. for allergies    Historical Provider, MD  OXYGEN Inhale 2 mLs into the lungs as needed (to keep sats above 90%).     Historical Provider, MD  potassium chloride (K-DUR,KLOR-CON) 10 MEQ tablet Take 10 mEq by mouth daily.    Historical Provider, MD  simvastatin (ZOCOR) 10 MG tablet Take 30 mg by mouth at bedtime.  04/25/15   Historical Provider, MD  UNABLE TO FIND Take 90 mLs by mouth 2 (two) times daily. Med Name: Med Pass    Historical Provider, MD    Family History Family History  Problem Relation Age of Onset  . Diabetes Mother   . High blood pressure Mother   . Heart Problems Father     Social History Social History  Substance Use Topics  . Smoking status: Never Smoker  . Smokeless tobacco: Never Used  . Alcohol use No     Allergies   Penicillins   Review of Systems Review of Systems Unable to be obtained due to non-verbal, dementia  Physical Exam Updated Vital Signs BP 153/68   Pulse (!) 59   Temp (!) 96.8 F (36 C) (Axillary)   Resp 19   SpO2 97%   Physical Exam Physical Exam  Nursing note and vitals reviewed. Constitutional: non-toxic, and in no acute distress Head: Normocephalic and atraumatic.  Mouth/Throat: Oropharynx is clear and moist Jaw is open, unable to close, bilateral anterior jaw dislocation.    Neck: Normal range of motion. Neck supple.  Cardiovascular: Normal rate and regular rhythm.   Pulmonary/Chest: Effort normal and breath sounds normal.  Abdominal: Soft. There is no tenderness. There is no rebound and no guarding.  Musculoskeletal: Normal range of motion.  Neurological: Alert, no facial droop, nonverbal, winces in pain,  moves all extremities symmetrically Skin: Skin is warm and dry.  Psychiatric: Cooperative   ED Treatments / Results  Labs (all labs ordered are listed, but only abnormal results are displayed) Labs Reviewed - No data to display  EKG  EKG Interpretation None       Radiology No results found.  Procedures Procedures (including critical care time) CRITICAL CARE Performed by: Forde Dandy   Total critical care time: 35 minutes  Critical care time was exclusive of separately billable procedures and treating other patients.  Critical care was necessary to treat or prevent imminent or life-threatening deterioration.  Critical care was time spent personally by me on the following activities: development of treatment plan with patient and/or surrogate as well as nursing, discussions with consultants, evaluation of patient's response to treatment, examination of patient, obtaining history from patient or surrogate, ordering and performing treatments and interventions, ordering and review of laboratory studies, ordering and review of radiographic studies, pulse oximetry and re-evaluation of patient's condition.  Procedural sedation Performed by: Forde Dandy Consent: Verbal consent obtained. Risks and benefits: risks, benefits and alternatives were discussed Required items: required blood products, implants, devices, and special equipment available Patient identity confirmed: arm band and provided demographic data Time out: Immediately prior to procedure a "time out" was called to verify the correct patient, procedure, equipment, support staff and  site/side marked as required.  Sedation type: moderate (conscious) sedation NPO time confirmed and considedered  Sedatives: PROPOFOL  Physician Time at Bedside: 35  Vitals: Vital signs were monitored during sedation. Cardiac Monitor, pulse oximeter Patient tolerance: Patient tolerated the procedure well with no immediate complications. Comments: Pt with uneventful recovered. Returned to pre-procedural sedation baseline   Reduction of dislocation Date/Time: 12:20 PM Performed by: Forde Dandy Authorized by: Forde Dandy Consent: Verbal consent obtained. Risks and benefits: risks, benefits and alternatives were discussed Consent given by: patient Required items: required blood products, implants, devices, and special equipment available Time out: Immediately prior to procedure a "time out" was called to verify the correct patient, procedure, equipment, support staff and site/side marked as required.  Patient sedated: propofol  Vitals: Vital signs were monitored during sedation. Patient tolerance: Patient tolerated the procedure well with no immediate complications. Joint: bilateral TMJ Reduction technique: downward pressure, posterior force bilaterally    Medications Ordered in ED Medications  propofol (DIPRIVAN) 10 mg/mL bolus/IV push 22 mg (not administered)  propofol (DIPRIVAN) 10 mg/mL bolus/IV push (22 mg Intravenous Given 05/02/16 1128)     Initial Impression / Assessment and Plan / ED Course  I have reviewed the triage vital signs and the nursing notes.  Pertinent labs & imaging results that were available during my care of the patient were reviewed by me and considered in my medical decision making (see chart for details).     Bilateral jaw dislocation, recurrently issue.  Reduced at bedside.  Able to close jaw completely after exam. Baseline now. Will discharge back to facility.   Final Clinical Impressions(s) / ED Diagnoses   Final diagnoses:  Dislocation  of jaw, bilateral, initial encounter    New Prescriptions New Prescriptions   No medications on file     Forde Dandy, MD 05/02/16 1220

## 2016-05-02 NOTE — ED Notes (Signed)
Attempted to call report to [facility], Nurse unavailable to take report.  PTAR called for transport.

## 2016-05-03 ENCOUNTER — Emergency Department (HOSPITAL_COMMUNITY)
Admission: EM | Admit: 2016-05-03 | Discharge: 2016-05-03 | Disposition: A | Discharge status: Home / Self Care | Attending: Emergency Medicine | Admitting: Emergency Medicine

## 2016-05-03 ENCOUNTER — Encounter (HOSPITAL_COMMUNITY): Payer: Self-pay | Admitting: Emergency Medicine

## 2016-05-03 DIAGNOSIS — S0300XA Dislocation of jaw, unspecified side, initial encounter: Secondary | ICD-10-CM | POA: Diagnosis not present

## 2016-05-03 DIAGNOSIS — Y929 Unspecified place or not applicable: Secondary | ICD-10-CM | POA: Insufficient documentation

## 2016-05-03 DIAGNOSIS — I5032 Chronic diastolic (congestive) heart failure: Secondary | ICD-10-CM | POA: Diagnosis not present

## 2016-05-03 DIAGNOSIS — X58XXXA Exposure to other specified factors, initial encounter: Secondary | ICD-10-CM | POA: Insufficient documentation

## 2016-05-03 DIAGNOSIS — Z79899 Other long term (current) drug therapy: Secondary | ICD-10-CM | POA: Diagnosis not present

## 2016-05-03 DIAGNOSIS — Z7982 Long term (current) use of aspirin: Secondary | ICD-10-CM | POA: Insufficient documentation

## 2016-05-03 DIAGNOSIS — Y999 Unspecified external cause status: Secondary | ICD-10-CM | POA: Insufficient documentation

## 2016-05-03 DIAGNOSIS — E039 Hypothyroidism, unspecified: Secondary | ICD-10-CM | POA: Insufficient documentation

## 2016-05-03 DIAGNOSIS — I110 Hypertensive heart disease with heart failure: Secondary | ICD-10-CM | POA: Insufficient documentation

## 2016-05-03 DIAGNOSIS — M26629 Arthralgia of temporomandibular joint, unspecified side: Secondary | ICD-10-CM | POA: Diagnosis present

## 2016-05-03 DIAGNOSIS — Z950 Presence of cardiac pacemaker: Secondary | ICD-10-CM | POA: Insufficient documentation

## 2016-05-03 DIAGNOSIS — Y939 Activity, unspecified: Secondary | ICD-10-CM | POA: Diagnosis not present

## 2016-05-03 DIAGNOSIS — J45909 Unspecified asthma, uncomplicated: Secondary | ICD-10-CM | POA: Diagnosis not present

## 2016-05-03 DIAGNOSIS — Z8673 Personal history of transient ischemic attack (TIA), and cerebral infarction without residual deficits: Secondary | ICD-10-CM | POA: Diagnosis not present

## 2016-05-03 MED ORDER — FENTANYL CITRATE (PF) 100 MCG/2ML IJ SOLN
50.0000 ug | Freq: Once | INTRAMUSCULAR | Status: AC
  Administered 2016-05-03: 50 ug via INTRAVENOUS
  Filled 2016-05-03: qty 2

## 2016-05-03 NOTE — ED Notes (Signed)
Called report to [facility] and ptar called for transport

## 2016-05-03 NOTE — Discharge Instructions (Signed)
Make sure to wear jaw sling at all times. Follow up with maxillofacial as needed.

## 2016-05-03 NOTE — ED Triage Notes (Signed)
Per EMS, pt from [facility], was found w/o her jaw securing equipment in place.  She has been unable to close her jaw for two hours.  This happens often, pt is at baseline, dementia and nonverbal.

## 2016-05-03 NOTE — ED Provider Notes (Signed)
[facility] DEPT Provider Note   CSN: [id number] Arrival date & time: 05/03/16  9485     History   Chief Complaint Chief Complaint  Patient presents with  . Temporomandibular Joint Pain    HPI Yvonne Buck is a 83 y.o. female.  HPI Yvonne Buck is a 83 y.o. female presents to emergency department with recurrent jaw dislocation. Found this morning upon awakening unable to close her mouth. History of the same. Patient is nonverbal. No other complaints. Pt is non verbal at baseline and unable to provide history.   Past Medical History:  Diagnosis Date  . Anxiety   . Asthma   . Chronic kidney disease   . Depression   . Fracture of greater trochanter of left femur ([place]) 04/02/2013  . Frequent falls   . Hip fracture, left ([place]) 04/02/2013  . Hyperlipidemia   . Hypertension   . Hypothyroidism   . Osteoporosis 06/23/2013  . Pacemaker   . Paroxysmal atrial fibrillation (HCC)    chads2vasc score is at least 6.  She is not a candidate for anticoagulation due to falls.  Her AF burden is low.  . Pelvic fracture ([place]) 06/23/2013  . Rhinitis, allergic 02/26/2014  . Senile osteoporosis 02/26/2014  . Sick sinus syndrome ([place])   . Stroke ([place])   . Tachycardia-bradycardia syndrome [facility])    s/p [employer] PPM implant in [place]  . TMJ (dislocation of temporomandibular joint) 06/16/2014  . Vitamin D deficiency     Patient Active Problem List   Diagnosis Date Noted  . Benign hypertensive heart disease with CHF (congestive heart failure) ([place]) 01/18/2016  . Dysphagia 06/22/2015  . Dementia with behavioral disturbance 04/23/2015  . Vitamin D deficiency 04/23/2015  . Protein-calorie malnutrition ([place]) 04/23/2015  . Insomnia 12/28/2014  . Chronic diastolic congestive heart failure ([place]) 10/15/2014  . Hypertensive heart and renal disease 10/15/2014  . Frequent falls 08/07/2014  . Protein-calorie malnutrition, severe ([place]) 06/22/2014  . Weakness 06/17/2014  . Steroid-induced  hyperglycemia 06/17/2014  . TMJ (dislocation of temporomandibular joint) 06/16/2014  . Hypokalemia 06/16/2014  . Sick sinus syndrome ([place]) 03/24/2014  . Hyperlipidemia 02/26/2014  . Senile osteoporosis 02/26/2014  . Asthmatic bronchitis 02/26/2014  . Rhinitis, allergic 02/26/2014  . Chronic asthma 06/16/2013  . Depression with anxiety 02/05/2013  . Gait instability 01/14/2013  . Left spastic hemiparesis ([place]) 11/16/2012  . Tachycardia-bradycardia syndrome ([place]) 01/06/2011  . Atrial fibrillation ([place]) 01/06/2011  . Hypothyroidism 12/10/2010    Past Surgical History:  Procedure Laterality Date  . CHOLECYSTECTOMY    . EP IMPLANTABLE DEVICE N/A 09/03/2015   Gen change with a St Jude Mediacl AssurityMRI DR pacemaker  . GALLBLADDER SURGERY    . PACEMAKER INSERTION  01/04/2005   [employer] [place] PPM 1290 [phone]), GDT [phone] atrial lead and 4457 V lead all implanted in [place]      OB History    No data available       Home Medications    Prior to Admission medications   Medication Sig Start Date End Date Taking? Authorizing Provider  acetaminophen (TYLENOL) 325 MG tablet Take 650 mg by mouth every 4 (four) hours as needed for mild pain or moderate pain.    Historical Provider, MD  acetaminophen (TYLENOL) 500 MG tablet Take 1,000 mg by mouth 3 (three) times daily as needed for mild pain or moderate pain.     Historical Provider, MD  amLODipine (NORVASC) 5 MG tablet Take 5 mg by  mouth daily.  08/21/14   Historical Provider, MD  aspirin EC 81 MG tablet Take 81 mg by mouth at bedtime. for AFIB    Historical Provider, MD  cholecalciferol (VITAMIN D) 1000 UNITS tablet Take 1,000 Units by mouth at bedtime. for vitamin D deficiency    Historical Provider, MD  Fluticasone-Salmeterol (ADVAIR) 250-50 MCG/DOSE AEPB Inhale 1 puff into the lungs 2 (two) times daily. for SOB/Wheeze. Rinse mouth after use.    Historical Provider, MD    HYDROcodone-acetaminophen (NORCO/VICODIN) 5-325 MG tablet Take 1 tablet by mouth daily as needed for moderate pain.     Historical Provider, MD  isosorbide mononitrate (IMDUR) 30 MG 24 hr tablet Take 30 mg by mouth every morning. for hypertension    Historical Provider, MD  losartan (COZAAR) 100 MG tablet Take 100 mg by mouth daily.     Historical Provider, MD  memantine (NAMENDA XR) 28 MG CP24 24 hr capsule Take 28 mg by mouth daily.    Historical Provider, MD  metoprolol (LOPRESSOR) 50 MG tablet Take 50 mg by mouth 2 (two) times daily.     Historical Provider, MD  mirtazapine (REMERON) 7.5 MG tablet Take 7.5 mg by mouth at bedtime.    Historical Provider, MD  montelukast (SINGULAIR) 10 MG tablet Take 10 mg by mouth at bedtime. for allergies    Historical Provider, MD  OXYGEN Inhale 2 mLs into the lungs as needed (to keep sats above 90%).     Historical Provider, MD  potassium chloride (K-DUR,KLOR-CON) 10 MEQ tablet Take 10 mEq by mouth daily.    Historical Provider, MD  simvastatin (ZOCOR) 10 MG tablet Take 30 mg by mouth at bedtime.  04/25/15   Historical Provider, MD  UNABLE TO FIND Take 90 mLs by mouth 2 (two) times daily. Med Name: Med Pass    Historical Provider, MD    Family History Family History  Problem Relation Age of Onset  . Diabetes Mother   . High blood pressure Mother   . Heart Problems Father     Social History Social History  Substance Use Topics  . Smoking status: Never Smoker  . Smokeless tobacco: Never Used  . Alcohol use No     Allergies   Penicillins   Review of Systems Review of Systems  Unable to perform ROS: Dementia     Physical Exam Updated Vital Signs BP 175/72 (BP Location: Right Arm)   Pulse 60   Temp (!) 96.8 F (36 C) (Axillary)   Resp 18   SpO2 96%   Physical Exam  Constitutional: She appears well-developed and well-nourished. No distress.  HENT:  Mouth open, unable to close, jaw dislocated  Eyes: Conjunctivae are normal.  Neck:  Neck supple.  Neurological: She is alert.  Skin: Skin is warm and dry.  Nursing note and vitals reviewed.    ED Treatments / Results  Labs (all labs ordered are listed, but only abnormal results are displayed) Labs Reviewed - No data to display  EKG  EKG Interpretation None       Radiology No results found.  Procedures Procedures (including critical care time)  Reduction of dislocation Date/Time: 7:47 AM Performed by: Jeannett Senior A and Dr. Maryan Rued Authorized by: Jeannett Senior A Consent: Verbal consent obtained. Risks and benefits: risks, benefits and alternatives were discussed Consent given by: patient Required items: required blood products, implants, devices, and special equipment available Time out: Immediately prior to procedure a "time out" was called to verify the correct  patient, procedure, equipment, support staff and site/side marked as required.  Patient sedated: medicated with fentanyl 44mcg  Vitals: Vital signs were monitored during sedation. Patient tolerance: Patient tolerated the procedure well with no immediate complications. Joint: mandible Reduction technique: down traction and anterior manipulation   Medications Ordered in ED Medications  fentaNYL (SUBLIMAZE) injection 50 mcg (not administered)     Initial Impression / Assessment and Plan / ED Course  I have reviewed the triage vital signs and the nursing notes.  Pertinent labs & imaging results that were available during my care of the patient were reviewed by me and considered in my medical decision making (see chart for details).     Patient in the emergency department with recurrent jaw dislocation. This is a recurrent problem. Patient has history of dementia, nonverbal at baseline. Patient is noncompliant with wearing her jaw sling.  jaw dislocation was reduced after administering 50 g of fentanyl. Patient tolerated procedure well. Ace wrap was wrapped around her jaw for  support. She was monitored after that no, she is more awake, alert, follows simple commands. Normal teeth alignment and patient is able to close her mouth completely after reduction. She is in no distress. She is stable for discharge home. Advised to continue to monitor and follow up as needed.   Vitals:   05/03/16 0650 05/03/16 0700 05/03/16 0800 05/03/16 0900  BP: 175/72 180/71 129/66 162/64  Pulse: 60 60 60 (!) 59  Resp: 18  15 15   Temp: (!) 96.8 F (36 C)     TempSrc: Axillary     SpO2: 96% 97% 99% 98%     Final Clinical Impressions(s) / ED Diagnoses   Final diagnoses:  Closed dislocation of jaw, initial encounter    New Prescriptions New Prescriptions   No medications on file     Jeannett Senior, PA-C 05/03/16 [place], MD 05/03/16 1130

## 2016-05-04 ENCOUNTER — Non-Acute Institutional Stay (SKILLED_NURSING_FACILITY): Payer: Medicare Other | Admitting: Family

## 2016-05-04 ENCOUNTER — Encounter: Payer: Self-pay | Admitting: Family

## 2016-05-04 DIAGNOSIS — F0391 Unspecified dementia with behavioral disturbance: Secondary | ICD-10-CM | POA: Diagnosis not present

## 2016-05-04 DIAGNOSIS — I509 Heart failure, unspecified: Secondary | ICD-10-CM | POA: Diagnosis not present

## 2016-05-04 DIAGNOSIS — I5032 Chronic diastolic (congestive) heart failure: Secondary | ICD-10-CM

## 2016-05-04 DIAGNOSIS — I110 Hypertensive heart disease with heart failure: Secondary | ICD-10-CM

## 2016-05-04 NOTE — Progress Notes (Signed)
Location:  [facility] Room Number: [facility]:  SNF (31) Provider: Dinah Ngetich FNP-C   Blanchie Serve, MD  Patient Care Team: Blanchie Serve, MD as PCP - General (Internal Medicine) Gerlene Fee, NP as Nurse Practitioner (Nurse Practitioner)  Extended Emergency Contact Information Primary Emergency Contact: [facility] Address: [address] [place], [place] [address] Johnnette Litter of [place] Phone: [phone] Mobile Phone: [phone] Relation: Daughter Secondary Emergency Contact: Rodriguez,Jorge Address: [address]          [place] Allegra, [place] Mobile Phone: [phone] Relation: Yolanda Bonine  Code Status:  DNR  Goals of care: Advanced Directive information Advanced Directives 05/04/2016  Does Patient Have a Medical Advance Directive? Yes  Type of Advance Directive Out of facility DNR (pink MOST or yellow form)  Does patient want to make changes to medical advance directive? No - Patient declined  Copy of [place] in Chart? -  Would patient like information on creating a medical advance directive? -  Pre-existing out of facility DNR order (yellow form or pink MOST form) Yellow form placed in chart (order not valid for inpatient use)     Chief Complaint  Patient presents with  . Medical Management of Chronic Issues    Routine Visit    HPI:  Pt is a 83 y.o. female seen today at [facility] for an acute visit for  Evaluation of cough. She has a medical history of HTN, CHF, Sick sinus syndrome, Afib, Dementia with behavioral disturbance, TMJ, Depression among other conditions. She is seen in her room today. She was evaluated in the ED x 5 times since prior visit for her chronic lower jaw dislocation.She has also had a 3 pound weight loss over one month. She continues to follow up with Registered Dietician with multiple  interventions in place.Also currently on Hospice service.  Patient unable to provide HPI and ROS due to dementia. Facility Nurse reports no new concerns this visit. Skin intact.   Past Medical History:  Diagnosis Date  . Anxiety   . Asthma   . Chronic kidney disease   . Depression   . Fracture of greater trochanter of left femur ([place]) 04/02/2013  . Frequent falls   . Hip fracture, left ([place]) 04/02/2013  . Hyperlipidemia   . Hypertension   . Hypothyroidism   . Osteoporosis 06/23/2013  . Pacemaker   . Paroxysmal atrial fibrillation (HCC)    chads2vasc score is at least 6.  She is not a candidate for anticoagulation due to falls.  Her AF burden is low.  . Pelvic fracture ([place]) 06/23/2013  . Rhinitis, allergic 02/26/2014  . Senile osteoporosis 02/26/2014  . Sick sinus syndrome ([place])   . Stroke ([place])   . Tachycardia-bradycardia syndrome [facility])    s/p [employer] PPM implant in [place]  . TMJ (dislocation of temporomandibular joint) 06/16/2014  . Vitamin D deficiency    Past Surgical History:  Procedure Laterality Date  . CHOLECYSTECTOMY    . EP IMPLANTABLE DEVICE N/A 09/03/2015   Gen change with a St Jude Mediacl AssurityMRI DR pacemaker  . GALLBLADDER SURGERY    . PACEMAKER INSERTION  01/04/2005   [employer] [place] PPM 1290 [phone]), GDT [phone] atrial lead and 4457 V lead all implanted in [place]  . TONSILLECTOMY AND ADENOIDECTOMY      Allergies  Allergen  Reactions  . Penicillins Other (See Comments)    Red spots, ### Tolerated Rocephin 12/2012 ###, Has patient had a PCN reaction causing immediate rash, facial/tongue/throat swelling, SOB or lightheadedness with hypotension: Unknown Has patient had a PCN reaction causing severe rash involving mucus membranes or skin necrosis: Unknown Has patient had a PCN reaction that required hospitalization Unknown Has patient had a PCN reaction occurring within the last 10 years: Unknown If all of the above answers are "NO", then may proceed  with Cephalosporin u    Allergies as of 05/04/2016      Reactions   Penicillins Other (See Comments)   Red spots, ### Tolerated Rocephin 12/2012 ###, Has patient had a PCN reaction causing immediate rash, facial/tongue/throat swelling, SOB or lightheadedness with hypotension: Unknown Has patient had a PCN reaction causing severe rash involving mucus membranes or skin necrosis: Unknown Has patient had a PCN reaction that required hospitalization Unknown Has patient had a PCN reaction occurring within the last 10 years: Unknown If all of the above answers are "NO", then may proceed with Cephalosporin u      Medication List       Accurate as of 05/04/16  2:06 PM. Always use your most recent med list.          acetaminophen 500 MG tablet Commonly known as:  TYLENOL Take 1,000 mg by mouth 3 (three) times daily as needed for mild pain or moderate pain.   acetaminophen 325 MG tablet Commonly known as:  TYLENOL Take 650 mg by mouth every 4 (four) hours as needed for mild pain or moderate pain.   amLODipine 5 MG tablet Commonly known as:  NORVASC Take 5 mg by mouth daily.   aspirin EC 81 MG tablet Take 81 mg by mouth at bedtime. for AFIB   cholecalciferol 1000 units tablet Commonly known as:  VITAMIN D Take 1,000 Units by mouth at bedtime. for vitamin D deficiency   Fluticasone-Salmeterol 250-50 MCG/DOSE Aepb Commonly known as:  ADVAIR Inhale 1 puff into the lungs 2 (two) times daily. for SOB/Wheeze. Rinse mouth after use.   HYDROcodone-acetaminophen 5-325 MG tablet Commonly known as:  NORCO/VICODIN Take 1 tablet by mouth daily as needed for moderate pain.   isosorbide mononitrate 30 MG 24 hr tablet Commonly known as:  IMDUR Take 30 mg by mouth every morning. for hypertension   losartan 100 MG tablet Commonly known as:  COZAAR Take 100 mg by mouth daily.   metoprolol 50 MG tablet Commonly known as:  LOPRESSOR Take 50 mg by mouth 2 (two) times daily.   mirtazapine 7.5  MG tablet Commonly known as:  REMERON Take 7.5 mg by mouth at bedtime.   montelukast 10 MG tablet Commonly known as:  SINGULAIR Take 10 mg by mouth at bedtime. for allergies   NAMENDA XR 28 MG Cp24 24 hr capsule Generic drug:  memantine Take 28 mg by mouth daily.   OXYGEN Inhale 2 mLs into the lungs as needed (to keep sats above 90%).   potassium chloride 10 MEQ tablet Commonly known as:  K-DUR,KLOR-CON Take 10 mEq by mouth daily.   simvastatin 10 MG tablet Commonly known as:  ZOCOR Take 30 mg by mouth at bedtime.   UNABLE TO FIND Take 90 mLs by mouth 2 (two) times daily. Med Name: Med Pass       Review of Systems  Unable to perform ROS: Dementia    Immunization History  Administered Date(s) Administered  . Influenza-Unspecified 11/28/2013, 12/03/2014  .  PPD Test 01/03/2013, 01/18/2013, 04/19/2013, 06/25/2014, 10/29/2014  . Pneumococcal-Unspecified 02/01/2013   Pertinent  Health Maintenance Due  Topic Date Due  . DEXA SCAN  10/04/1996  . PNA vac Low Risk Adult (2 of 2 - PCV13) 02/01/2014  . INFLUENZA VACCINE  02/21/2017 (Originally 09/22/2015)   Fall Risk  12/10/2010  Risk for fall due to : History of fall(s);Impaired balance/gait    Vitals:   05/04/16 1043  BP: (!) 141/73  Pulse: 79  Resp: 18  Temp: 97.5 F (36.4 C)  TempSrc: Oral  SpO2: 90%  Weight: 95 lb 11.2 oz (43.4 kg)  Height: 5\' 5"  (1.651 m)   Body mass index is 15.93 kg/m. Physical Exam  Constitutional: She appears well-developed. No distress.  Frail elderly in no acute distress   HENT:  Head: Normocephalic.  Mouth/Throat: Oropharynx is clear and moist. No oropharyngeal exudate.  Eyes: Conjunctivae and EOM are normal. Pupils are equal, round, and reactive to light. Right eye exhibits no discharge. Left eye exhibits no discharge. No scleral icterus.  Neck: Neck supple. No JVD present.  Cardiovascular: Normal rate, regular rhythm, normal heart sounds and intact distal pulses.  Exam reveals  no gallop and no friction rub.   No murmur heard. Pulmonary/Chest: Effort normal and breath sounds normal. No respiratory distress. She has no wheezes.  Abdominal: Soft. Bowel sounds are normal. She exhibits no distension. There is no tenderness. There is no rebound and no guarding.  Genitourinary:  Genitourinary Comments: Incontinent   Musculoskeletal: She exhibits no edema, tenderness or deformity.  Moves x 4 extremities.Wheelchair bound    Lymphadenopathy:    She has no cervical adenopathy.  Neurological: She is alert.  Nonverbal this visit but has good eye contact.  Skin: Skin is warm and dry. No rash noted. No erythema. No pallor.  Skin intact   Psychiatric: She has a normal mood and affect.    Labs reviewed:  Recent Labs  05/23/15 1441  09/03/15 1230 09/03/15 1948 10/30/15 11/05/15 02/20/16  NA 140  < > 141 136 143 142 138  K 4.8  < > 3.6 3.4* 4.0 4.2 4.3  CL 103  --  102 102  --   --   --   CO2 30  --  30 28  --   --   --   GLUCOSE 116*  --  112* 97  --   --   --   BUN 20  < > 15 11 15 18 15   CREATININE 0.75  < > 0.58 0.51 0.5 0.6 0.6  CALCIUM 9.4  --  9.6 8.8*  --   --   --   < > = values in this interval not displayed.  Recent Labs  05/23/15 1441 06/02/15 10/30/15 11/05/15  AST 21 14 13 15   ALT 29 13 18 22   ALKPHOS 77 75 76 70  BILITOT 1.0  --   --   --   PROT 6.5  --   --   --   ALBUMIN 3.1*  --   --   --     Recent Labs  05/23/15 1441  09/03/15 1230 09/03/15 1948 10/30/15 11/05/15 02/20/16  WBC 8.7  < > 9.5 8.1 8.6 8.9 8.8  NEUTROABS 4.5  --   --  4.5  --   --   --   HGB 15.1*  < > 16.5* 14.6 14.1 12.9 12.8  HCT 46.7*  < > 52.9* 46.2* 45 41 40  MCV 89.6  --  91.4 89.9  --   --   --   PLT 322  < > 292 280 277 298 301  < > = values in this interval not displayed. Lab Results  Component Value Date   TSH 0.30 (A) 04/06/2016   Lab Results  Component Value Date   HGBA1C 6.1 03/31/2015   Lab Results  Component Value Date   CHOL 123 04/06/2016    HDL 39 04/06/2016   LDLCALC 60 04/06/2016   LDLDIRECT 83 12/10/2010   TRIG 116 04/06/2016   Assessment/Plan  HTN B/p stable. Continue on Metoprolol, amlodipine,cozaar and Imdur. Monitor BMP.    CHF Stable.negative exam findings.Continue on Metoprolol, amlodipine,cozaar and Imdur.monitor weight.    Dementia with Behavioral disturbance Has had no new behavioral issues.continue to assist with ADL's. Aspiration precautions. Skin care. Continue to follow up with Hospice service.   Family/ staff Communication: Reviewed plan of care with patient and facility Nurse supervisor.   Labs/tests ordered:   None

## 2016-05-05 LAB — CBC AND DIFFERENTIAL
HCT: 46 % (ref 36–46)
Hemoglobin: 14.1 g/dL (ref 12.0–16.0)
PLATELETS: 365 10*3/uL (ref 150–399)
WBC: 8.3 10^3/mL

## 2016-05-05 LAB — HEPATIC FUNCTION PANEL
ALT: 21 U/L (ref 7–35)
AST: 22 U/L (ref 13–35)
Alkaline Phosphatase: 75 U/L (ref 25–125)
Bilirubin, Total: 0.6 mg/dL

## 2016-05-05 LAB — BASIC METABOLIC PANEL
BUN: 17 mg/dL (ref 4–21)
CREATININE: 0.6 mg/dL (ref 0.5–1.1)
GLUCOSE: 81 mg/dL
Potassium: 4.6 mmol/L (ref 3.4–5.3)
Sodium: 143 mmol/L (ref 137–147)

## 2016-05-11 ENCOUNTER — Encounter (HOSPITAL_COMMUNITY): Payer: Self-pay | Admitting: Emergency Medicine

## 2016-05-11 ENCOUNTER — Emergency Department (HOSPITAL_COMMUNITY)
Admission: EM | Admit: 2016-05-11 | Discharge: 2016-05-11 | Disposition: A | Discharge status: Home / Self Care | Attending: Emergency Medicine | Admitting: Emergency Medicine

## 2016-05-11 ENCOUNTER — Emergency Department (HOSPITAL_COMMUNITY)
Admission: EM | Admit: 2016-05-11 | Discharge: 2016-05-11 | Disposition: A | Discharge status: Home / Self Care | Source: Home / Self Care | Attending: Emergency Medicine | Admitting: Emergency Medicine

## 2016-05-11 ENCOUNTER — Encounter (HOSPITAL_COMMUNITY): Payer: Self-pay | Admitting: Nurse Practitioner

## 2016-05-11 DIAGNOSIS — I130 Hypertensive heart and chronic kidney disease with heart failure and stage 1 through stage 4 chronic kidney disease, or unspecified chronic kidney disease: Secondary | ICD-10-CM | POA: Insufficient documentation

## 2016-05-11 DIAGNOSIS — Z950 Presence of cardiac pacemaker: Secondary | ICD-10-CM

## 2016-05-11 DIAGNOSIS — X58XXXA Exposure to other specified factors, initial encounter: Secondary | ICD-10-CM | POA: Insufficient documentation

## 2016-05-11 DIAGNOSIS — Z8673 Personal history of transient ischemic attack (TIA), and cerebral infarction without residual deficits: Secondary | ICD-10-CM | POA: Insufficient documentation

## 2016-05-11 DIAGNOSIS — Z7982 Long term (current) use of aspirin: Secondary | ICD-10-CM

## 2016-05-11 DIAGNOSIS — J45909 Unspecified asthma, uncomplicated: Secondary | ICD-10-CM | POA: Insufficient documentation

## 2016-05-11 DIAGNOSIS — N189 Chronic kidney disease, unspecified: Secondary | ICD-10-CM | POA: Insufficient documentation

## 2016-05-11 DIAGNOSIS — Y929 Unspecified place or not applicable: Secondary | ICD-10-CM

## 2016-05-11 DIAGNOSIS — Y999 Unspecified external cause status: Secondary | ICD-10-CM | POA: Insufficient documentation

## 2016-05-11 DIAGNOSIS — I5032 Chronic diastolic (congestive) heart failure: Secondary | ICD-10-CM | POA: Diagnosis not present

## 2016-05-11 DIAGNOSIS — E039 Hypothyroidism, unspecified: Secondary | ICD-10-CM | POA: Insufficient documentation

## 2016-05-11 DIAGNOSIS — Z79899 Other long term (current) drug therapy: Secondary | ICD-10-CM | POA: Insufficient documentation

## 2016-05-11 DIAGNOSIS — S0300XA Dislocation of jaw, unspecified side, initial encounter: Secondary | ICD-10-CM | POA: Insufficient documentation

## 2016-05-11 DIAGNOSIS — Y939 Activity, unspecified: Secondary | ICD-10-CM | POA: Insufficient documentation

## 2016-05-11 NOTE — ED Notes (Signed)
Bed: HY07 Expected date:  Expected time:  Means of arrival:  Comments: EMS 83 yo female jaw pain/hx multiple dislocations to jaw

## 2016-05-11 NOTE — ED Provider Notes (Signed)
[facility] DEPT Provider Note   CSN: [id number] Arrival date & time: 05/11/16  1409     History   Chief Complaint No chief complaint on file.   HPI Yvonne Buck is a 83 y.o. female.  HPI Yvonne Buck is a 83 y.o. female on hospice/palliative care, with multiple medical problems, dementia, presents to emergency department with recurrent jaw dislocation. Patient was just seen for the same this morning. Patient coming from a nursing home. Was found again with her mandible dislocated, unable to close her mouth. Patient demented at baseline, unable to provide any history. According to nursing home staff, patient does not wear her mandible sling. No other complaints.   Past Medical History:  Diagnosis Date  . Anxiety   . Asthma   . Chronic kidney disease   . Depression   . Fracture of greater trochanter of left femur ([place]) 04/02/2013  . Frequent falls   . Hip fracture, left ([place]) 04/02/2013  . Hyperlipidemia   . Hypertension   . Hypothyroidism   . Osteoporosis 06/23/2013  . Pacemaker   . Paroxysmal atrial fibrillation (HCC)    chads2vasc score is at least 6.  She is not a candidate for anticoagulation due to falls.  Her AF burden is low.  . Pelvic fracture ([place]) 06/23/2013  . Rhinitis, allergic 02/26/2014  . Senile osteoporosis 02/26/2014  . Sick sinus syndrome ([place])   . Stroke ([place])   . Tachycardia-bradycardia syndrome [facility])    s/p [employer] PPM implant in [place]  . TMJ (dislocation of temporomandibular joint) 06/16/2014  . Vitamin D deficiency     Patient Active Problem List   Diagnosis Date Noted  . Benign hypertensive heart disease with CHF (congestive heart failure) ([place]) 01/18/2016  . Dysphagia 06/22/2015  . Dementia with behavioral disturbance 04/23/2015  . Vitamin D deficiency 04/23/2015  . Protein-calorie malnutrition ([place]) 04/23/2015  . Insomnia 12/28/2014  . Chronic diastolic congestive heart failure ([place]) 10/15/2014  . Hypertensive heart and renal  disease 10/15/2014  . Frequent falls 08/07/2014  . Protein-calorie malnutrition, severe ([place]) 06/22/2014  . Weakness 06/17/2014  . Steroid-induced hyperglycemia 06/17/2014  . TMJ (dislocation of temporomandibular joint) 06/16/2014  . Hypokalemia 06/16/2014  . Sick sinus syndrome ([place]) 03/24/2014  . Hyperlipidemia 02/26/2014  . Senile osteoporosis 02/26/2014  . Asthmatic bronchitis 02/26/2014  . Rhinitis, allergic 02/26/2014  . Chronic asthma 06/16/2013  . Depression with anxiety 02/05/2013  . Gait instability 01/14/2013  . Left spastic hemiparesis ([place]) 11/16/2012  . Tachycardia-bradycardia syndrome ([place]) 01/06/2011  . Atrial fibrillation ([place]) 01/06/2011  . Hypothyroidism 12/10/2010    Past Surgical History:  Procedure Laterality Date  . CHOLECYSTECTOMY    . EP IMPLANTABLE DEVICE N/A 09/03/2015   Gen change with a St Jude Mediacl AssurityMRI DR pacemaker  . GALLBLADDER SURGERY    . PACEMAKER INSERTION  01/04/2005   [employer] [place] PPM 1290 [phone]), GDT [phone] atrial lead and 4457 V lead all implanted in [place]      OB History    No data available       Home Medications    Prior to Admission medications   Medication Sig Start Date End Date Taking? Authorizing Provider  acetaminophen (TYLENOL) 325 MG tablet Take 650 mg by mouth every 4 (four) hours as needed for mild pain or moderate pain.    Historical Provider, MD  acetaminophen (TYLENOL) 500 MG tablet Take 1,000 mg by mouth 3 (three) times daily as needed for mild  pain or moderate pain.     Historical Provider, MD  amLODipine (NORVASC) 5 MG tablet Take 5 mg by mouth daily.  08/21/14   Historical Provider, MD  aspirin EC 81 MG tablet Take 81 mg by mouth at bedtime. for AFIB    Historical Provider, MD  cholecalciferol (VITAMIN D) 1000 UNITS tablet Take 1,000 Units by mouth at bedtime. for vitamin D deficiency    Historical Provider, MD  Fluticasone-Salmeterol (ADVAIR) 250-50  MCG/DOSE AEPB Inhale 1 puff into the lungs 2 (two) times daily. for SOB/Wheeze. Rinse mouth after use.    Historical Provider, MD  HYDROcodone-acetaminophen (NORCO/VICODIN) 5-325 MG tablet Take 1 tablet by mouth daily as needed for moderate pain.     Historical Provider, MD  isosorbide mononitrate (IMDUR) 30 MG 24 hr tablet Take 30 mg by mouth every morning. for hypertension    Historical Provider, MD  losartan (COZAAR) 100 MG tablet Take 100 mg by mouth daily.     Historical Provider, MD  memantine (NAMENDA XR) 28 MG CP24 24 hr capsule Take 28 mg by mouth daily.    Historical Provider, MD  metoprolol (LOPRESSOR) 50 MG tablet Take 50 mg by mouth 2 (two) times daily.     Historical Provider, MD  mirtazapine (REMERON) 7.5 MG tablet Take 7.5 mg by mouth at bedtime.    Historical Provider, MD  montelukast (SINGULAIR) 10 MG tablet Take 10 mg by mouth at bedtime. for allergies    Historical Provider, MD  OXYGEN Inhale 2 mLs into the lungs as needed (to keep sats above 90%).     Historical Provider, MD  potassium chloride (K-DUR,KLOR-CON) 10 MEQ tablet Take 10 mEq by mouth daily.    Historical Provider, MD  simvastatin (ZOCOR) 10 MG tablet Take 30 mg by mouth at bedtime.  04/25/15   Historical Provider, MD  UNABLE TO FIND Take 90 mLs by mouth 2 (two) times daily. Med Name: Med Pass    Historical Provider, MD    Family History Family History  Problem Relation Age of Onset  . Diabetes Mother   . High blood pressure Mother   . Heart Problems Father     Social History Social History  Substance Use Topics  . Smoking status: Never Smoker  . Smokeless tobacco: Never Used  . Alcohol use No     Allergies   Penicillins   Review of Systems Review of Systems  Unable to perform ROS: Dementia     Physical Exam Updated Vital Signs There were no vitals taken for this visit.  Physical Exam  Constitutional: She appears well-developed and well-nourished. No distress.  HENT:  Obviously dislocated  mandible. Mouth is held wide open. Unable to close.  Eyes: Conjunctivae are normal.  Neck: Neck supple.  Neurological: She is alert.  Skin: Skin is warm and dry.  Nursing note and vitals reviewed.    ED Treatments / Results  Labs (all labs ordered are listed, but only abnormal results are displayed) Labs Reviewed - No data to display  EKG  EKG Interpretation None       Radiology No results found.  Procedures Procedures (including critical care time)  Reduction of dislocation Date/Time: 3:22 PM Performed by: Renold Genta Authorized by: Jeannett Senior A Consent: Verbal consent obtained. Risks and benefits: risks, benefits and alternatives were discussed Consent given by: patient Required items: required blood products, implants, devices, and special equipment available Time out: Immediately prior to procedure a "time out" was called to verify the  correct patient, procedure, equipment, support staff and site/side marked as required.  Patient sedated: no  Vitals: Vital signs were monitored during sedation. Patient tolerance: Patient tolerated the procedure well with no immediate complications. Joint: mandible Reduction technique: external gentile down and anterior traction.    Medications Ordered in ED Medications - No data to display   Initial Impression / Assessment and Plan / ED Course  I have reviewed the triage vital signs and the nursing notes.  Pertinent labs & imaging results that were available during my care of the patient were reviewed by me and considered in my medical decision making (see chart for details).     Patient in emergency department with recurrent jaw dislocation. This is a recurrent issue, she was seen in emergency department several days ago for the same and again this morning. Jar reduced with no difficulty. Ace wrap applied for support. Will discharge back to the facility.  Vitals:   05/11/16 1426  BP: 126/78  Pulse:  88  Resp: 18  Temp: 98.2 F (36.8 C)  TempSrc: Oral  SpO2: 99%  Weight: 43.1 kg  Height: 5\' 5"  (1.651 m)     Final Clinical Impressions(s) / ED Diagnoses   Final diagnoses:  Jaw dislocation, initial encounter    New Prescriptions New Prescriptions   No medications on file     Jeannett Senior, PA-C 05/11/16 [place], MD 05/11/16 [phone]

## 2016-05-11 NOTE — ED Notes (Signed)
PTAR made aware of patient transport needed back to [facility].

## 2016-05-11 NOTE — ED Triage Notes (Signed)
Per [facility] patient from [facility] coming in for jaw dislocation that wont close. Patient has TMJ and history of jaw dislocations.  Patient has dementia and at her baseline per SNF staff.

## 2016-05-11 NOTE — ED Notes (Signed)
Bed: [facility] Expected date:  Expected time:  Means of arrival:  Comments: EMS-jaw dislocation

## 2016-05-11 NOTE — Progress Notes (Signed)
[facility] Liaison: RN  Spoke with Dr. Vanita Panda regarding patient's frequent visits to ED.   I asked if there was anything we could do to help with this.  He advised that ideally, it would be nice if we could get patient to keep her brace on at all times.  Unfortunately, given dementia status, hard to accomplish that goal.     Will continue to monitor.  Thank you,  Edyth Gunnels, RN, [facility] Liaison [phone]  All hospital liaison's are now on [place].

## 2016-05-11 NOTE — ED Provider Notes (Signed)
[facility] DEPT Provider Note   CSN: [id number] Arrival date & time: 05/11/16  0711     History   Chief Complaint Chief Complaint  Patient presents with  . jaw dislocation    HPI Yvonne Buck is a 83 y.o. female.  HPI  Level V caveat, dementia  Elderly female presents from her nursing facility due to grossly dislocated jaw. Patient has a history of recurrent dislocations, is supposed to wear a supportive device. Per report the patient was found this morning without the device in place. Patient is nonverbal, provide any details of the history of present illness.   Past Medical History:  Diagnosis Date  . Anxiety   . Asthma   . Chronic kidney disease   . Depression   . Fracture of greater trochanter of left femur ([place]) 04/02/2013  . Frequent falls   . Hip fracture, left ([place]) 04/02/2013  . Hyperlipidemia   . Hypertension   . Hypothyroidism   . Osteoporosis 06/23/2013  . Pacemaker   . Paroxysmal atrial fibrillation (HCC)    chads2vasc score is at least 6.  She is not a candidate for anticoagulation due to falls.  Her AF burden is low.  . Pelvic fracture ([place]) 06/23/2013  . Rhinitis, allergic 02/26/2014  . Senile osteoporosis 02/26/2014  . Sick sinus syndrome ([place])   . Stroke ([place])   . Tachycardia-bradycardia syndrome [facility])    s/p [employer] PPM implant in [place]  . TMJ (dislocation of temporomandibular joint) 06/16/2014  . Vitamin D deficiency     Patient Active Problem List   Diagnosis Date Noted  . Benign hypertensive heart disease with CHF (congestive heart failure) ([place]) 01/18/2016  . Dysphagia 06/22/2015  . Dementia with behavioral disturbance 04/23/2015  . Vitamin D deficiency 04/23/2015  . Protein-calorie malnutrition ([place]) 04/23/2015  . Insomnia 12/28/2014  . Chronic diastolic congestive heart failure ([place]) 10/15/2014  . Hypertensive heart and renal disease 10/15/2014  . Frequent falls 08/07/2014  . Protein-calorie malnutrition, severe ([place])  06/22/2014  . Weakness 06/17/2014  . Steroid-induced hyperglycemia 06/17/2014  . TMJ (dislocation of temporomandibular joint) 06/16/2014  . Hypokalemia 06/16/2014  . Sick sinus syndrome ([place]) 03/24/2014  . Hyperlipidemia 02/26/2014  . Senile osteoporosis 02/26/2014  . Asthmatic bronchitis 02/26/2014  . Rhinitis, allergic 02/26/2014  . Chronic asthma 06/16/2013  . Depression with anxiety 02/05/2013  . Gait instability 01/14/2013  . Left spastic hemiparesis ([place]) 11/16/2012  . Tachycardia-bradycardia syndrome ([place]) 01/06/2011  . Atrial fibrillation ([place]) 01/06/2011  . Hypothyroidism 12/10/2010    Past Surgical History:  Procedure Laterality Date  . CHOLECYSTECTOMY    . EP IMPLANTABLE DEVICE N/A 09/03/2015   Gen change with a St Jude Mediacl AssurityMRI DR pacemaker  . GALLBLADDER SURGERY    . PACEMAKER INSERTION  01/04/2005   [employer] [place] PPM 1290 [phone]), GDT [phone] atrial lead and 4457 V lead all implanted in [place]      OB History    No data available       Home Medications    Prior to Admission medications   Medication Sig Start Date End Date Taking? Authorizing Provider  acetaminophen (TYLENOL) 325 MG tablet Take 650 mg by mouth every 4 (four) hours as needed for mild pain or moderate pain.    Historical Provider, MD  acetaminophen (TYLENOL) 500 MG tablet Take 1,000 mg by mouth 3 (three) times daily as needed for mild pain or moderate pain.     Historical Provider, MD  amLODipine (NORVASC) 5 MG tablet Take 5 mg by mouth daily.  08/21/14   Historical Provider, MD  aspirin EC 81 MG tablet Take 81 mg by mouth at bedtime. for AFIB    Historical Provider, MD  cholecalciferol (VITAMIN D) 1000 UNITS tablet Take 1,000 Units by mouth at bedtime. for vitamin D deficiency    Historical Provider, MD  Fluticasone-Salmeterol (ADVAIR) 250-50 MCG/DOSE AEPB Inhale 1 puff into the lungs 2 (two) times daily. for SOB/Wheeze. Rinse mouth after  use.    Historical Provider, MD  HYDROcodone-acetaminophen (NORCO/VICODIN) 5-325 MG tablet Take 1 tablet by mouth daily as needed for moderate pain.     Historical Provider, MD  isosorbide mononitrate (IMDUR) 30 MG 24 hr tablet Take 30 mg by mouth every morning. for hypertension    Historical Provider, MD  losartan (COZAAR) 100 MG tablet Take 100 mg by mouth daily.     Historical Provider, MD  memantine (NAMENDA XR) 28 MG CP24 24 hr capsule Take 28 mg by mouth daily.    Historical Provider, MD  metoprolol (LOPRESSOR) 50 MG tablet Take 50 mg by mouth 2 (two) times daily.     Historical Provider, MD  mirtazapine (REMERON) 7.5 MG tablet Take 7.5 mg by mouth at bedtime.    Historical Provider, MD  montelukast (SINGULAIR) 10 MG tablet Take 10 mg by mouth at bedtime. for allergies    Historical Provider, MD  OXYGEN Inhale 2 mLs into the lungs as needed (to keep sats above 90%).     Historical Provider, MD  potassium chloride (K-DUR,KLOR-CON) 10 MEQ tablet Take 10 mEq by mouth daily.    Historical Provider, MD  simvastatin (ZOCOR) 10 MG tablet Take 30 mg by mouth at bedtime.  04/25/15   Historical Provider, MD  UNABLE TO FIND Take 90 mLs by mouth 2 (two) times daily. Med Name: Med Pass    Historical Provider, MD    Family History Family History  Problem Relation Age of Onset  . Diabetes Mother   . High blood pressure Mother   . Heart Problems Father     Social History Social History  Substance Use Topics  . Smoking status: Never Smoker  . Smokeless tobacco: Never Used  . Alcohol use No     Allergies   Penicillins   Review of Systems Review of Systems  Unable to perform ROS: Dementia     Physical Exam Updated Vital Signs BP (!) 171/75 (BP Location: Left Arm)   Pulse 60   Temp 97.5 F (36.4 C) (Axillary) Comment: patient unable to close mouth  Resp 19   SpO2 95%   Physical Exam  Constitutional: She appears well-developed and well-nourished. No distress.  HENT:  Mouth open,  unable to close, jaw dislocated  Eyes: Conjunctivae are normal.  Neck: Neck supple.  Cardiovascular: Normal rate and intact distal pulses.   Pulmonary/Chest: Effort normal. No respiratory distress.  Musculoskeletal: She exhibits no deformity.  Neurological: She is alert.  Essentially nonverbal, follows some commands, inconsistently, moves all extremity spontaneously  Skin: Skin is warm and dry.  Psychiatric: Cognition and memory are impaired. She is noncommunicative.  Nursing note and vitals reviewed.    ED Treatments / Results  Chart review notable for 18 visits over the past days, typically for jaw dislocation. Procedures ORTHOPEDIC INJURY TREATMENT Date/Time: 05/11/2016 7:25 AM Performed by: Carmin Muskrat Authorized by: Carmin Muskrat  Consent: The procedure was performed in an emergent situation. Verbal consent not obtained. Written consent not obtained. Risks  and benefits discussed: dementia patient. Consent given by: physician - emergent procedure. Patient identity confirmed: arm band Time out: Immediately prior to procedure a "time out" was called to verify the correct patient, procedure, equipment, support staff and site/side marked as required. Injury location: jaw Location details: bilateral TMJ Injury type: dislocation Jaw dislocation chronicity: recurrent Pre-procedure neurovascular assessment: neurovascularly intact Pre-procedure distal perfusion: normal Pre-procedure neurological function: normal Pre-procedure range of motion: reduced  Anesthesia: Local anesthesia used: no  Sedation: Patient sedated: no Manipulation performed: yes Reduction method: downward posterior pressure Reduction successful: yes X-ray confirmed reduction: clinical - opens and closes spontaneously, Post-procedure neurovascular assessment: post-procedure neurovascularly intact Post-procedure distal perfusion: normal Post-procedure neurological function: normal Post-procedure range  of motion: unchanged Patient tolerance: Patient tolerated the procedure well with no immediate complications    (including critical care time)   Initial Impression / Assessment and Plan / ED Course  I have reviewed the triage vital signs and the nursing notes.  Pertinent labs & imaging results that were available during my care of the patient were reviewed by me and considered in my medical decision making (see chart for details).  After the initial evaluation, chart review, I reduced the patient's jaw without complication.   Final Clinical Impressions(s) / ED Diagnoses   Final diagnoses:  Dislocation of temporomandibular joint, initial encounter     Carmin Muskrat, MD 05/11/16 [phone]

## 2016-05-11 NOTE — ED Notes (Signed)
Bed: [facility]D Expected date:  Expected time:  Means of arrival:  Comments: 

## 2016-05-11 NOTE — Discharge Instructions (Signed)
Please make sure Yvonne Buck wears her jaw sling. Please follow up with her doctor to figure out a plan to prevent further dislocations

## 2016-05-11 NOTE — ED Triage Notes (Signed)
Patient was just in the ER earlier today for jaw dislocation. She has returnned for the same thing

## 2016-05-14 ENCOUNTER — Emergency Department (HOSPITAL_COMMUNITY)
Admission: EM | Admit: 2016-05-14 | Discharge: 2016-05-14 | Disposition: A | Discharge status: Home / Self Care | Attending: Emergency Medicine | Admitting: Emergency Medicine

## 2016-05-14 ENCOUNTER — Encounter (HOSPITAL_COMMUNITY): Payer: Self-pay | Admitting: Emergency Medicine

## 2016-05-14 DIAGNOSIS — E039 Hypothyroidism, unspecified: Secondary | ICD-10-CM | POA: Insufficient documentation

## 2016-05-14 DIAGNOSIS — N189 Chronic kidney disease, unspecified: Secondary | ICD-10-CM | POA: Diagnosis not present

## 2016-05-14 DIAGNOSIS — X58XXXA Exposure to other specified factors, initial encounter: Secondary | ICD-10-CM | POA: Diagnosis not present

## 2016-05-14 DIAGNOSIS — Z8673 Personal history of transient ischemic attack (TIA), and cerebral infarction without residual deficits: Secondary | ICD-10-CM | POA: Diagnosis not present

## 2016-05-14 DIAGNOSIS — Y929 Unspecified place or not applicable: Secondary | ICD-10-CM | POA: Insufficient documentation

## 2016-05-14 DIAGNOSIS — Y999 Unspecified external cause status: Secondary | ICD-10-CM | POA: Diagnosis not present

## 2016-05-14 DIAGNOSIS — Y939 Activity, unspecified: Secondary | ICD-10-CM | POA: Insufficient documentation

## 2016-05-14 DIAGNOSIS — I130 Hypertensive heart and chronic kidney disease with heart failure and stage 1 through stage 4 chronic kidney disease, or unspecified chronic kidney disease: Secondary | ICD-10-CM | POA: Insufficient documentation

## 2016-05-14 DIAGNOSIS — S0300XA Dislocation of jaw, unspecified side, initial encounter: Secondary | ICD-10-CM | POA: Diagnosis present

## 2016-05-14 DIAGNOSIS — Z950 Presence of cardiac pacemaker: Secondary | ICD-10-CM | POA: Insufficient documentation

## 2016-05-14 DIAGNOSIS — Z7982 Long term (current) use of aspirin: Secondary | ICD-10-CM | POA: Insufficient documentation

## 2016-05-14 DIAGNOSIS — I5032 Chronic diastolic (congestive) heart failure: Secondary | ICD-10-CM | POA: Insufficient documentation

## 2016-05-14 NOTE — ED Provider Notes (Signed)
[facility] DEPT Provider Note   CSN: [id number] Arrival date & time:        History   Chief Complaint No chief complaint on file.  Level V caveat: Dementia  HPI Yvonne Buck is a 83 y.o. female.  HPI  Patient with a history of recurrent jaw dislocations presents to the emergency department from the nursing home with a jaw dislocation.  No other complaints at this time.   Past Medical History:  Diagnosis Date  . Anxiety   . Asthma   . Chronic kidney disease   . Depression   . Fracture of greater trochanter of left femur ([place]) 04/02/2013  . Frequent falls   . Hip fracture, left ([place]) 04/02/2013  . Hyperlipidemia   . Hypertension   . Hypothyroidism   . Osteoporosis 06/23/2013  . Pacemaker   . Paroxysmal atrial fibrillation (HCC)    chads2vasc score is at least 6.  She is not a candidate for anticoagulation due to falls.  Her AF burden is low.  . Pelvic fracture ([place]) 06/23/2013  . Rhinitis, allergic 02/26/2014  . Senile osteoporosis 02/26/2014  . Sick sinus syndrome ([place])   . Stroke ([place])   . Tachycardia-bradycardia syndrome [facility])    s/p [employer] PPM implant in [place]  . TMJ (dislocation of temporomandibular joint) 06/16/2014  . Vitamin D deficiency     Patient Active Problem List   Diagnosis Date Noted  . Benign hypertensive heart disease with CHF (congestive heart failure) ([place]) 01/18/2016  . Dysphagia 06/22/2015  . Dementia with behavioral disturbance 04/23/2015  . Vitamin D deficiency 04/23/2015  . Protein-calorie malnutrition ([place]) 04/23/2015  . Insomnia 12/28/2014  . Chronic diastolic congestive heart failure ([place]) 10/15/2014  . Hypertensive heart and renal disease 10/15/2014  . Frequent falls 08/07/2014  . Protein-calorie malnutrition, severe ([place]) 06/22/2014  . Weakness 06/17/2014  . Steroid-induced hyperglycemia 06/17/2014  . TMJ (dislocation of temporomandibular joint) 06/16/2014  . Hypokalemia 06/16/2014  . Sick sinus syndrome ([place]) 03/24/2014    . Hyperlipidemia 02/26/2014  . Senile osteoporosis 02/26/2014  . Asthmatic bronchitis 02/26/2014  . Rhinitis, allergic 02/26/2014  . Chronic asthma 06/16/2013  . Depression with anxiety 02/05/2013  . Gait instability 01/14/2013  . Left spastic hemiparesis ([place]) 11/16/2012  . Tachycardia-bradycardia syndrome ([place]) 01/06/2011  . Atrial fibrillation ([place]) 01/06/2011  . Hypothyroidism 12/10/2010    Past Surgical History:  Procedure Laterality Date  . CHOLECYSTECTOMY    . EP IMPLANTABLE DEVICE N/A 09/03/2015   Gen change with a St Jude Mediacl AssurityMRI DR pacemaker  . GALLBLADDER SURGERY    . PACEMAKER INSERTION  01/04/2005   [employer] [place] PPM [phone]), GDT [phone] atrial lead and 4457 V lead all implanted in [place]      OB History    No data available       Home Medications    Prior to Admission medications   Medication Sig Start Date End Date Taking? Authorizing Provider  acetaminophen (TYLENOL) 325 MG tablet Take 650 mg by mouth every 4 (four) hours as needed for mild pain or moderate pain.    Historical Provider, MD  acetaminophen (TYLENOL) 500 MG tablet Take 1,000 mg by mouth 3 (three) times daily as needed for mild pain or moderate pain.     Historical Provider, MD  amLODipine (NORVASC) 5 MG tablet Take 5 mg by mouth daily.  08/21/14   Historical Provider, MD  aspirin EC 81 MG tablet Take 81 mg by  mouth at bedtime. for AFIB    Historical Provider, MD  cholecalciferol (VITAMIN D) 1000 UNITS tablet Take 1,000 Units by mouth at bedtime. for vitamin D deficiency    Historical Provider, MD  Fluticasone-Salmeterol (ADVAIR) 250-50 MCG/DOSE AEPB Inhale 1 puff into the lungs 2 (two) times daily. for SOB/Wheeze. Rinse mouth after use.    Historical Provider, MD  HYDROcodone-acetaminophen (NORCO/VICODIN) 5-325 MG tablet Take 1 tablet by mouth daily as needed for moderate pain.     Historical Provider, MD  isosorbide mononitrate  (IMDUR) 30 MG 24 hr tablet Take 30 mg by mouth every morning. for hypertension    Historical Provider, MD  losartan (COZAAR) 100 MG tablet Take 100 mg by mouth daily.     Historical Provider, MD  memantine (NAMENDA XR) 28 MG CP24 24 hr capsule Take 28 mg by mouth daily.    Historical Provider, MD  metoprolol (LOPRESSOR) 50 MG tablet Take 50 mg by mouth 2 (two) times daily.     Historical Provider, MD  mirtazapine (REMERON) 7.5 MG tablet Take 7.5 mg by mouth at bedtime.    Historical Provider, MD  montelukast (SINGULAIR) 10 MG tablet Take 10 mg by mouth at bedtime. for allergies    Historical Provider, MD  OXYGEN Inhale 2 mLs into the lungs as needed (to keep sats above 90%).     Historical Provider, MD  potassium chloride (K-DUR,KLOR-CON) 10 MEQ tablet Take 10 mEq by mouth daily.    Historical Provider, MD  simvastatin (ZOCOR) 10 MG tablet Take 30 mg by mouth at bedtime.  04/25/15   Historical Provider, MD  UNABLE TO FIND Take 90 mLs by mouth 2 (two) times daily. Med Name: Med Pass    Historical Provider, MD    Family History Family History  Problem Relation Age of Onset  . Diabetes Mother   . High blood pressure Mother   . Heart Problems Father     Social History Social History  Substance Use Topics  . Smoking status: Never Smoker  . Smokeless tobacco: Never Used  . Alcohol use No     Allergies   Penicillins   Review of Systems Review of Systems  Unable to perform ROS: Dementia     Physical Exam Updated Vital Signs There were no vitals taken for this visit.  Physical Exam  Constitutional: She is oriented to person, place, and time. She appears well-developed and well-nourished.  HENT:  Head: Normocephalic.  Mouth open wide.  Unable to oppose teeth  Eyes: EOM are normal.  Neck: Normal range of motion.  Pulmonary/Chest: Effort normal.  Abdominal: She exhibits no distension.  Musculoskeletal: Normal range of motion.  Neurological: She is alert and oriented to person,  place, and time.  Psychiatric: She has a normal mood and affect.  Nursing note and vitals reviewed.    ED Treatments / Results  Labs (all labs ordered are listed, but only abnormal results are displayed) Labs Reviewed - No data to display  EKG  EKG Interpretation None       Radiology No results found.  Procedures Procedures (including critical care time)  ++++++++++++++++++++++++++++++++++++++++++++++++  Reduction of dislocation Performed by: Hoy Morn Consent: Verbal consent obtained. Risks and benefits: risks, benefits and alternatives were discussed Consent given by: patient Required items: required blood products, implants, devices, and special equipment available Time out: Immediately prior to procedure a "time out" was called to verify the correct patient, procedure, equipment, support staff and site/side marked as required. Patient sedated: no  Vitals: Vital signs were monitored during sedation. Patient tolerance: Patient tolerated the procedure well with no immediate complications. Joint: TMJ (mandible) Reduction technique: manipulation  ++++++++++++++++++++++++++++++++++++++++++++++++   Medications Ordered in ED Medications - No data to display   Initial Impression / Assessment and Plan / ED Course  I have reviewed the triage vital signs and the nursing notes.  Pertinent labs & imaging results that were available during my care of the patient were reviewed by me and considered in my medical decision making (see chart for details).     Jaw dislocation.  Patient tolerated the procedure well.  Discharge back to the nursing home and this time.  Final Clinical Impressions(s) / ED Diagnoses   Final diagnoses:  Closed dislocation of mandible, initial encounter    New Prescriptions New Prescriptions   No medications on file     Jola Schmidt, MD 05/14/16 [phone]

## 2016-05-14 NOTE — ED Triage Notes (Addendum)
Pt from SNF via [facility] with c/o reoccurring jaw dislocation.  Dr Venora Maples saw pt before being triaged and reduced the jaw back into place and discharged her while still on the [place] stretcher.  Pt does not appear to be in any discomfort.  Sleeping comfortable on [facility] stretcher.  NAD, A&O.

## 2016-05-16 ENCOUNTER — Encounter (HOSPITAL_COMMUNITY): Payer: Self-pay | Admitting: Emergency Medicine

## 2016-05-16 ENCOUNTER — Emergency Department (HOSPITAL_COMMUNITY)
Admission: EM | Admit: 2016-05-16 | Discharge: 2016-05-16 | Disposition: A | Discharge status: Home / Self Care | Attending: Emergency Medicine | Admitting: Emergency Medicine

## 2016-05-16 DIAGNOSIS — E039 Hypothyroidism, unspecified: Secondary | ICD-10-CM | POA: Diagnosis not present

## 2016-05-16 DIAGNOSIS — Z950 Presence of cardiac pacemaker: Secondary | ICD-10-CM | POA: Insufficient documentation

## 2016-05-16 DIAGNOSIS — X58XXXA Exposure to other specified factors, initial encounter: Secondary | ICD-10-CM | POA: Diagnosis not present

## 2016-05-16 DIAGNOSIS — Y939 Activity, unspecified: Secondary | ICD-10-CM | POA: Diagnosis not present

## 2016-05-16 DIAGNOSIS — Z8673 Personal history of transient ischemic attack (TIA), and cerebral infarction without residual deficits: Secondary | ICD-10-CM | POA: Insufficient documentation

## 2016-05-16 DIAGNOSIS — Z7982 Long term (current) use of aspirin: Secondary | ICD-10-CM | POA: Insufficient documentation

## 2016-05-16 DIAGNOSIS — N189 Chronic kidney disease, unspecified: Secondary | ICD-10-CM | POA: Diagnosis not present

## 2016-05-16 DIAGNOSIS — Y929 Unspecified place or not applicable: Secondary | ICD-10-CM | POA: Diagnosis not present

## 2016-05-16 DIAGNOSIS — S0300XA Dislocation of jaw, unspecified side, initial encounter: Secondary | ICD-10-CM | POA: Diagnosis present

## 2016-05-16 DIAGNOSIS — Y999 Unspecified external cause status: Secondary | ICD-10-CM | POA: Diagnosis not present

## 2016-05-16 DIAGNOSIS — J45909 Unspecified asthma, uncomplicated: Secondary | ICD-10-CM | POA: Insufficient documentation

## 2016-05-16 DIAGNOSIS — I130 Hypertensive heart and chronic kidney disease with heart failure and stage 1 through stage 4 chronic kidney disease, or unspecified chronic kidney disease: Secondary | ICD-10-CM | POA: Diagnosis not present

## 2016-05-16 DIAGNOSIS — I5032 Chronic diastolic (congestive) heart failure: Secondary | ICD-10-CM | POA: Diagnosis not present

## 2016-05-16 NOTE — ED Provider Notes (Signed)
[facility] DEPT Provider Note   CSN: [id number] Arrival date & time: 05/16/16  6144  By signing my name below, I, Sonum Patel, attest that this documentation has been prepared under the direction and in the presence of Virgel Manifold, MD. Electronically Signed: Sonum Patel, Scribe. 05/16/16. 9:28 AM.  History   Chief Complaint No chief complaint on file.   The history is provided by the EMS personnel. The history is limited by the condition of the patient. No language interpreter was used.    LEVEL 5 CAVEAT: Dementia HPI Comments: Yvonne Buck is a 83 y.o. female brought in by ambulance, who presents to the Emergency Department from a nursing home with a jaw dislocation today. She is frequently seen in the ED for similar occurrences. No other complaints at this time.   Past Medical History:  Diagnosis Date  . Anxiety   . Asthma   . Chronic kidney disease   . Depression   . Fracture of greater trochanter of left femur ([place]) 04/02/2013  . Frequent falls   . Hip fracture, left ([place]) 04/02/2013  . Hyperlipidemia   . Hypertension   . Hypothyroidism   . Osteoporosis 06/23/2013  . Pacemaker   . Paroxysmal atrial fibrillation (HCC)    chads2vasc score is at least 6.  She is not a candidate for anticoagulation due to falls.  Her AF burden is low.  . Pelvic fracture ([place]) 06/23/2013  . Rhinitis, allergic 02/26/2014  . Senile osteoporosis 02/26/2014  . Sick sinus syndrome ([place])   . Stroke ([place])   . Tachycardia-bradycardia syndrome [facility])    s/p [employer] PPM implant in [place]  . TMJ (dislocation of temporomandibular joint) 06/16/2014  . Vitamin D deficiency     Patient Active Problem List   Diagnosis Date Noted  . Benign hypertensive heart disease with CHF (congestive heart failure) ([place]) 01/18/2016  . Dysphagia 06/22/2015  . Dementia with behavioral disturbance 04/23/2015  . Vitamin D deficiency 04/23/2015  . Protein-calorie malnutrition ([place]) 04/23/2015  . Insomnia 12/28/2014    . Chronic diastolic congestive heart failure ([place]) 10/15/2014  . Hypertensive heart and renal disease 10/15/2014  . Frequent falls 08/07/2014  . Protein-calorie malnutrition, severe ([place]) 06/22/2014  . Weakness 06/17/2014  . Steroid-induced hyperglycemia 06/17/2014  . TMJ (dislocation of temporomandibular joint) 06/16/2014  . Hypokalemia 06/16/2014  . Sick sinus syndrome ([place]) 03/24/2014  . Hyperlipidemia 02/26/2014  . Senile osteoporosis 02/26/2014  . Asthmatic bronchitis 02/26/2014  . Rhinitis, allergic 02/26/2014  . Chronic asthma 06/16/2013  . Depression with anxiety 02/05/2013  . Gait instability 01/14/2013  . Left spastic hemiparesis ([place]) 11/16/2012  . Tachycardia-bradycardia syndrome ([place]) 01/06/2011  . Atrial fibrillation ([place]) 01/06/2011  . Hypothyroidism 12/10/2010    Past Surgical History:  Procedure Laterality Date  . CHOLECYSTECTOMY    . EP IMPLANTABLE DEVICE N/A 09/03/2015   Gen change with a St Jude Mediacl AssurityMRI DR pacemaker  . GALLBLADDER SURGERY    . PACEMAKER INSERTION  01/04/2005   [employer] [place] PPM [phone]), GDT [phone] atrial lead and 4457 V lead all implanted in [place]      OB History    No data available       Home Medications    Prior to Admission medications   Medication Sig Start Date End Date Taking? Authorizing Provider  acetaminophen (TYLENOL) 325 MG tablet Take 650 mg by mouth every 4 (four) hours as needed for mild pain or moderate pain.  Historical Provider, MD  acetaminophen (TYLENOL) 500 MG tablet Take 1,000 mg by mouth 3 (three) times daily as needed for mild pain or moderate pain.     Historical Provider, MD  amLODipine (NORVASC) 5 MG tablet Take 5 mg by mouth daily.  08/21/14   Historical Provider, MD  aspirin EC 81 MG tablet Take 81 mg by mouth at bedtime. for AFIB    Historical Provider, MD  cholecalciferol (VITAMIN D) 1000 UNITS tablet Take 1,000 Units by mouth at bedtime.  for vitamin D deficiency    Historical Provider, MD  Fluticasone-Salmeterol (ADVAIR) 250-50 MCG/DOSE AEPB Inhale 1 puff into the lungs 2 (two) times daily. for SOB/Wheeze. Rinse mouth after use.    Historical Provider, MD  HYDROcodone-acetaminophen (NORCO/VICODIN) 5-325 MG tablet Take 1 tablet by mouth daily as needed for moderate pain.     Historical Provider, MD  isosorbide mononitrate (IMDUR) 30 MG 24 hr tablet Take 30 mg by mouth every morning. for hypertension    Historical Provider, MD  losartan (COZAAR) 100 MG tablet Take 100 mg by mouth daily.     Historical Provider, MD  memantine (NAMENDA XR) 28 MG CP24 24 hr capsule Take 28 mg by mouth daily.    Historical Provider, MD  metoprolol (LOPRESSOR) 50 MG tablet Take 50 mg by mouth 2 (two) times daily.     Historical Provider, MD  mirtazapine (REMERON) 7.5 MG tablet Take 7.5 mg by mouth at bedtime.    Historical Provider, MD  montelukast (SINGULAIR) 10 MG tablet Take 10 mg by mouth at bedtime. for allergies    Historical Provider, MD  OXYGEN Inhale 2 mLs into the lungs as needed (to keep sats above 90%).     Historical Provider, MD  potassium chloride (K-DUR,KLOR-CON) 10 MEQ tablet Take 10 mEq by mouth daily.    Historical Provider, MD  simvastatin (ZOCOR) 10 MG tablet Take 30 mg by mouth at bedtime.  04/25/15   Historical Provider, MD  UNABLE TO FIND Take 90 mLs by mouth 2 (two) times daily. Med Name: Med Pass    Historical Provider, MD    Family History Family History  Problem Relation Age of Onset  . Diabetes Mother   . High blood pressure Mother   . Heart Problems Father     Social History Social History  Substance Use Topics  . Smoking status: Never Smoker  . Smokeless tobacco: Never Used  . Alcohol use No     Allergies   Penicillins   Review of Systems Review of Systems  Unable to perform ROS: Dementia     Physical Exam Updated Vital Signs There were no vitals taken for this visit.  Physical Exam    Constitutional: She is oriented to person, place, and time. She appears well-developed and well-nourished.  HENT:  Head: Normocephalic.  Mouth open and deviated to pt's right.   Eyes: EOM are normal.  Neck: Normal range of motion.  Pulmonary/Chest: Effort normal.  Abdominal: She exhibits no distension.  Musculoskeletal: Normal range of motion.  Neurological: She is alert and oriented to person, place, and time.  Psychiatric: She has a normal mood and affect.  Nursing note and vitals reviewed.    ED Treatments / Results    Labs (all labs ordered are listed, but only abnormal results are displayed) Labs Reviewed - No data to display  EKG  EKG Interpretation None       Radiology No results found.  Procedures Procedures (including critical care time)  Reduction of dislocation Performed by: Virgel Manifold Consent: Verbal consent obtained. Risks and benefits: risks, benefits and alternatives were discussed Consent given by: patient Required items: required blood products, implants, devices, and special equipment available Time out: Immediately prior to procedure a "time out" was called to verify the correct patient, procedure, equipment, support staff and site/side marked as required. Patient sedated: no Vitals: Vital signs were monitored during sedation. Patient tolerance: Patient tolerated the procedure well with no immediate complications. Joint: TMJ (mandible) Reduction technique: manipulation  Medications Ordered in ED Medications - No data to display   Initial Impression / Assessment and Plan / ED Course  I have reviewed the triage vital signs and the nursing notes.  Pertinent labs & imaging results that were available during my care of the patient were reviewed by me and considered in my medical decision making (see chart for details).     83 year old female with mandible dislocation. She is seen frequently for the same. This was easily reduced. Given the  frequency of her visits for this and no other concerning findigns, I do not feel she requires imaging today.  Final Clinical Impressions(s) / ED Diagnoses   Final diagnoses:  Closed dislocation of jaw, initial encounter    New Prescriptions New Prescriptions   No medications on file   I personally preformed the services scribed in my presence. The recorded information has been reviewed is accurate. Virgel Manifold, MD.    Virgel Manifold, MD 05/16/16 [phone]

## 2016-05-16 NOTE — ED Notes (Signed)
Dr Wilson Singer easily reduced jaw dislocation.  Pt tolerated well.

## 2016-05-16 NOTE — ED Triage Notes (Signed)
Pt has spontaneous jaw dislocations regularly.  Second time today.  Dr Wilson Singer into room.

## 2016-05-23 ENCOUNTER — Emergency Department (HOSPITAL_COMMUNITY)
Admission: EM | Admit: 2016-05-23 | Discharge: 2016-05-23 | Disposition: A | Discharge status: Home / Self Care | Attending: Emergency Medicine | Admitting: Emergency Medicine

## 2016-05-23 ENCOUNTER — Emergency Department (HOSPITAL_COMMUNITY)

## 2016-05-23 ENCOUNTER — Encounter (HOSPITAL_COMMUNITY): Payer: Self-pay | Admitting: *Deleted

## 2016-05-23 DIAGNOSIS — Y929 Unspecified place or not applicable: Secondary | ICD-10-CM | POA: Insufficient documentation

## 2016-05-23 DIAGNOSIS — Y939 Activity, unspecified: Secondary | ICD-10-CM | POA: Diagnosis not present

## 2016-05-23 DIAGNOSIS — Z950 Presence of cardiac pacemaker: Secondary | ICD-10-CM | POA: Diagnosis not present

## 2016-05-23 DIAGNOSIS — Z8673 Personal history of transient ischemic attack (TIA), and cerebral infarction without residual deficits: Secondary | ICD-10-CM | POA: Insufficient documentation

## 2016-05-23 DIAGNOSIS — N189 Chronic kidney disease, unspecified: Secondary | ICD-10-CM | POA: Insufficient documentation

## 2016-05-23 DIAGNOSIS — Z7982 Long term (current) use of aspirin: Secondary | ICD-10-CM | POA: Insufficient documentation

## 2016-05-23 DIAGNOSIS — S0300XA Dislocation of jaw, unspecified side, initial encounter: Secondary | ICD-10-CM

## 2016-05-23 DIAGNOSIS — E039 Hypothyroidism, unspecified: Secondary | ICD-10-CM | POA: Insufficient documentation

## 2016-05-23 DIAGNOSIS — J45909 Unspecified asthma, uncomplicated: Secondary | ICD-10-CM | POA: Diagnosis not present

## 2016-05-23 DIAGNOSIS — Y999 Unspecified external cause status: Secondary | ICD-10-CM | POA: Diagnosis not present

## 2016-05-23 DIAGNOSIS — I5032 Chronic diastolic (congestive) heart failure: Secondary | ICD-10-CM | POA: Diagnosis not present

## 2016-05-23 DIAGNOSIS — S0303XA Dislocation of jaw, bilateral, initial encounter: Secondary | ICD-10-CM | POA: Insufficient documentation

## 2016-05-23 DIAGNOSIS — I130 Hypertensive heart and chronic kidney disease with heart failure and stage 1 through stage 4 chronic kidney disease, or unspecified chronic kidney disease: Secondary | ICD-10-CM | POA: Diagnosis not present

## 2016-05-23 DIAGNOSIS — X58XXXA Exposure to other specified factors, initial encounter: Secondary | ICD-10-CM | POA: Insufficient documentation

## 2016-05-23 LAB — COMPREHENSIVE METABOLIC PANEL
ALT: 14 U/L (ref 14–54)
ANION GAP: 7 (ref 5–15)
AST: 15 U/L (ref 15–41)
Albumin: 3 g/dL — ABNORMAL LOW (ref 3.5–5.0)
Alkaline Phosphatase: 67 U/L (ref 38–126)
BUN: 24 mg/dL — ABNORMAL HIGH (ref 6–20)
CHLORIDE: 111 mmol/L (ref 101–111)
CO2: 29 mmol/L (ref 22–32)
Calcium: 8.9 mg/dL (ref 8.9–10.3)
Creatinine, Ser: 0.51 mg/dL (ref 0.44–1.00)
GFR calc non Af Amer: >60 mL/min (ref >60)
Glucose, Bld: 89 mg/dL (ref 65–99)
POTASSIUM: 3.2 mmol/L — AB (ref 3.5–5.1)
SODIUM: 147 mmol/L — AB (ref 135–145)
Total Bilirubin: 0.8 mg/dL (ref 0.3–1.2)
Total Protein: 5.9 g/dL — ABNORMAL LOW (ref 6.5–8.1)

## 2016-05-23 LAB — CBC WITH DIFFERENTIAL/PLATELET
Basophils Absolute: 0 10*3/uL (ref 0.0–0.1)
Basophils Relative: 0 %
EOS ABS: 1.2 10*3/uL — AB (ref 0.0–0.7)
EOS PCT: 14 %
HCT: 39.1 % (ref 36.0–46.0)
Hemoglobin: 12.4 g/dL (ref 12.0–15.0)
LYMPHS ABS: 1.4 10*3/uL (ref 0.7–4.0)
Lymphocytes Relative: 16 %
MCH: 28.6 pg (ref 26.0–34.0)
MCHC: 31.7 g/dL (ref 30.0–36.0)
MCV: 90.1 fL (ref 78.0–100.0)
MONO ABS: 0.5 10*3/uL (ref 0.1–1.0)
Monocytes Relative: 6 %
Neutro Abs: 5.6 10*3/uL (ref 1.7–7.7)
Neutrophils Relative %: 64 %
Platelets: 302 10*3/uL (ref 150–400)
RBC: 4.34 MIL/uL (ref 3.87–5.11)
RDW: 14.1 % (ref 11.5–15.5)
WBC: 8.7 10*3/uL (ref 4.0–10.5)

## 2016-05-23 LAB — I-STAT CG4 LACTIC ACID, ED: LACTIC ACID, VENOUS: 0.86 mmol/L (ref 0.5–1.9)

## 2016-05-23 LAB — I-STAT TROPONIN, ED: TROPONIN I, POC: 0 ng/mL (ref 0.00–0.08)

## 2016-05-23 MED ORDER — FENTANYL CITRATE (PF) 100 MCG/2ML IJ SOLN
50.0000 ug | Freq: Once | INTRAMUSCULAR | Status: AC
  Administered 2016-05-23: 50 ug via INTRAVENOUS
  Filled 2016-05-23: qty 2

## 2016-05-23 NOTE — Progress Notes (Signed)
[facility] Liaison:  Visited patient in room with no family present at bedside.  Patient responds to pain, but unable to participate in conversation which is baseline.  Patient presented with dislocated jaw, which has now been successfully reduced.  Plan at this time is to discharge back to the facility.  Will speak to hospice RN to assess if family would like in and cath for urinalysis to r/o UTI.  Please feel free to contact with any hospice-related questions or concerns.  Thank you, Freddi Starr RN, [facility] Liaison [phone]

## 2016-05-23 NOTE — ED Notes (Signed)
Contacted facility and [facility] to pick up.  Room 1005

## 2016-05-23 NOTE — ED Provider Notes (Signed)
[name] Provider Note   CSN: [id number] Arrival date & time: 05/23/16  1442     History   Chief Complaint Chief Complaint  Patient presents with  . Dysphagia  . Dislocation    jaw    HPI Yvonne Buck is a 83 y.o. female.  HPI   83 year old female with extensive past medical history as below including end-stage dementia, with history of recurrent jaw dislocations, here with jaw dislocation. History is limited due to severe dementia. Patient is nonverbal. According to EMS report, they were told patient has had decreased by mouth intake over the last 2 days then dislocated her jaw while eating this morning. She has not had any fevers.  Level 5 caveat invoked as remainder of history, ROS, and physical exam limited due to patient's dementia.   Past Medical History:  Diagnosis Date  . Anxiety   . Asthma   . Chronic kidney disease   . Depression   . Fracture of greater trochanter of left femur ([place]) 04/02/2013  . Frequent falls   . Hip fracture, left ([place]) 04/02/2013  . Hyperlipidemia   . Hypertension   . Hypothyroidism   . Osteoporosis 06/23/2013  . Pacemaker   . Paroxysmal atrial fibrillation (HCC)    chads2vasc score is at least 6.  She is not a candidate for anticoagulation due to falls.  Her AF burden is low.  . Pelvic fracture ([place]) 06/23/2013  . Rhinitis, allergic 02/26/2014  . Senile osteoporosis 02/26/2014  . Sick sinus syndrome ([place])   . Stroke ([place])   . Tachycardia-bradycardia syndrome [facility])    s/p [employer] PPM implant in [place]  . TMJ (dislocation of temporomandibular joint) 06/16/2014  . Vitamin D deficiency     Patient Active Problem List   Diagnosis Date Noted  . Benign hypertensive heart disease with CHF (congestive heart failure) ([place]) 01/18/2016  . Dysphagia 06/22/2015  . Dementia with behavioral disturbance 04/23/2015  . Vitamin D deficiency 04/23/2015  . Protein-calorie malnutrition ([place]) 04/23/2015  . Insomnia 12/28/2014  . Chronic  diastolic congestive heart failure ([place]) 10/15/2014  . Hypertensive heart and renal disease 10/15/2014  . Frequent falls 08/07/2014  . Protein-calorie malnutrition, severe ([place]) 06/22/2014  . Weakness 06/17/2014  . Steroid-induced hyperglycemia 06/17/2014  . TMJ (dislocation of temporomandibular joint) 06/16/2014  . Hypokalemia 06/16/2014  . Sick sinus syndrome ([place]) 03/24/2014  . Hyperlipidemia 02/26/2014  . Senile osteoporosis 02/26/2014  . Asthmatic bronchitis 02/26/2014  . Rhinitis, allergic 02/26/2014  . Chronic asthma 06/16/2013  . Depression with anxiety 02/05/2013  . Gait instability 01/14/2013  . Left spastic hemiparesis ([place]) 11/16/2012  . Tachycardia-bradycardia syndrome ([place]) 01/06/2011  . Atrial fibrillation ([place]) 01/06/2011  . Hypothyroidism 12/10/2010    Past Surgical History:  Procedure Laterality Date  . CHOLECYSTECTOMY    . EP IMPLANTABLE DEVICE N/A 09/03/2015   Gen change with a St Jude Mediacl AssurityMRI DR pacemaker  . GALLBLADDER SURGERY    . PACEMAKER INSERTION  01/04/2005   [employer] [place] PPM [phone]), GDT [phone] atrial lead and 4457 V lead all implanted in [place]      OB History    No data available       Home Medications    Prior to Admission medications   Medication Sig Start Date End Date Taking? Authorizing Provider  acetaminophen (TYLENOL) 325 MG tablet Take 650 mg by mouth every 4 (four) hours as needed for mild pain or moderate pain.  Historical Provider, MD  acetaminophen (TYLENOL) 500 MG tablet Take 1,000 mg by mouth 3 (three) times daily as needed for mild pain or moderate pain.     Historical Provider, MD  amLODipine (NORVASC) 5 MG tablet Take 5 mg by mouth daily.  08/21/14   Historical Provider, MD  aspirin EC 81 MG tablet Take 81 mg by mouth at bedtime. for AFIB    Historical Provider, MD  cholecalciferol (VITAMIN D) 1000 UNITS tablet Take 1,000 Units by mouth at bedtime. for vitamin  D deficiency    Historical Provider, MD  Fluticasone-Salmeterol (ADVAIR) 250-50 MCG/DOSE AEPB Inhale 1 puff into the lungs 2 (two) times daily. for SOB/Wheeze. Rinse mouth after use.    Historical Provider, MD  HYDROcodone-acetaminophen (NORCO/VICODIN) 5-325 MG tablet Take 1 tablet by mouth daily as needed for moderate pain.     Historical Provider, MD  isosorbide mononitrate (IMDUR) 30 MG 24 hr tablet Take 30 mg by mouth every morning. for hypertension    Historical Provider, MD  losartan (COZAAR) 100 MG tablet Take 100 mg by mouth daily.     Historical Provider, MD  memantine (NAMENDA XR) 28 MG CP24 24 hr capsule Take 28 mg by mouth daily.    Historical Provider, MD  metoprolol (LOPRESSOR) 50 MG tablet Take 50 mg by mouth 2 (two) times daily.     Historical Provider, MD  mirtazapine (REMERON) 7.5 MG tablet Take 7.5 mg by mouth at bedtime.    Historical Provider, MD  montelukast (SINGULAIR) 10 MG tablet Take 10 mg by mouth at bedtime. for allergies    Historical Provider, MD  OXYGEN Inhale 2 mLs into the lungs as needed (to keep sats above 90%).     Historical Provider, MD  potassium chloride (K-DUR,KLOR-CON) 10 MEQ tablet Take 10 mEq by mouth daily.    Historical Provider, MD  simvastatin (ZOCOR) 10 MG tablet Take 30 mg by mouth at bedtime.  04/25/15   Historical Provider, MD  UNABLE TO FIND Take 90 mLs by mouth 2 (two) times daily. Med Name: Med Pass    Historical Provider, MD    Family History Family History  Problem Relation Age of Onset  . Diabetes Mother   . High blood pressure Mother   . Heart Problems Father     Social History Social History  Substance Use Topics  . Smoking status: Never Smoker  . Smokeless tobacco: Never Used  . Alcohol use No     Allergies   Penicillins   Review of Systems Review of Systems  Unable to perform ROS: Dementia     Physical Exam Updated Vital Signs BP (!) 161/132 (BP Location: Right Arm)   Pulse 73   Temp 98 F (36.7 C) (Axillary)    Resp 20   SpO2 91%   Physical Exam  Constitutional: She appears well-developed and well-nourished. No distress.  HENT:  Head: Normocephalic and atraumatic.  Jaw malocclusion noted, unable to close mouth. Marked ligamentous laxity. No trauma or bruising. OP patent and clear without bleeding.  Eyes: Conjunctivae are normal.  Neck: Neck supple.  Cardiovascular: Normal rate, regular rhythm and normal heart sounds.  Exam reveals no friction rub.   No murmur heard. Pulmonary/Chest: Effort normal and breath sounds normal. No respiratory distress. She has no wheezes. She has no rales.  Abdominal: She exhibits no distension.  Musculoskeletal: She exhibits no edema.  Skin: Skin is warm. Capillary refill takes less than 2 seconds.  Psychiatric: She has a normal mood and  affect.  Nursing note and vitals reviewed.    ED Treatments / Results  Labs (all labs ordered are listed, but only abnormal results are displayed) Labs Reviewed  CBC WITH DIFFERENTIAL/PLATELET - Abnormal; Notable for the following:       Result Value   Eosinophils Absolute 1.2 (*)    All other components within normal limits  COMPREHENSIVE METABOLIC PANEL - Abnormal; Notable for the following:    Sodium 147 (*)    Potassium 3.2 (*)    BUN 24 (*)    Total Protein 5.9 (*)    Albumin 3.0 (*)    All other components within normal limits  I-STAT CG4 LACTIC ACID, ED  I-STAT TROPOININ, ED  I-STAT CG4 LACTIC ACID, ED    EKG  EKG Interpretation None       Radiology Dg Mandible 1-3 Views  Result Date: 05/23/2016 CLINICAL DATA:  Post reduction.  Locked jaw. EXAM: MANDIBLE - 1-3 VIEW COMPARISON:  02/03/2016 and 01/14/2016 FINDINGS: Satisfactory position of the bilateral mandibular condyles in the TMJs, better visualized on the left. No fracture is seen. IMPRESSION: Negative. Electronically Signed   By: Julian Hy M.D.   On: 05/23/2016 17:00    Procedures Reduction of dislocation Date/Time: 05/23/2016 5:59  PM Performed by: Duffy Bruce Authorized by: Duffy Bruce  Consent: The procedure was performed in an emergent situation. Required items: required blood products, implants, devices, and special equipment available Patient identity confirmed: arm band Time out: Immediately prior to procedure a "time out" was called to verify the correct patient, procedure, equipment, support staff and site/side marked as required. Local anesthesia used: no  Anesthesia: Local anesthesia used: no  Sedation: Patient sedated: yes Sedatives: fentanyl Vitals: Vital signs were monitored during sedation. Patient tolerance: Patient tolerated the procedure well with no immediate complications    (including critical care time)  Medications Ordered in ED Medications  fentaNYL (SUBLIMAZE) injection 50 mcg (50 mcg Intravenous Given 05/23/16 1548)     Initial Impression / Assessment and Plan / ED Course  I have reviewed the triage vital signs and the nursing notes.  Pertinent labs & imaging results that were available during my care of the patient were reviewed by me and considered in my medical decision making (see chart for details).     83 year old female with history of recurrent jaw dislocations here with jaw dislocation. Patient also reportedly had decreased by mouth intake. The jaw was reduced as above uneventfully and films confirm appropriate placement. She is now closing her mouth without difficulty. Otherwise, screening lab work largely unremarkable. I discussed with hospice, they do not feel further workup is indicated and would like patient to return home. I believe this is reasonable. She is otherwise full hospice care. She does not appear to be in pain or distress. Discharged home.   Final Clinical Impressions(s) / ED Diagnoses   Final diagnoses:  Dislocation of temporomandibular joint, initial encounter    New Prescriptions Discharge Medication List as of 05/23/2016  5:15 PM        Duffy Bruce, MD 05/23/16 [phone]

## 2016-05-23 NOTE — ED Triage Notes (Signed)
Pt bib EMS from [facility].  Pt presents with a locked jaw.  Pt has been seen in this ED for similar complaints in the past.  Today staff at the facility also reports that pt has been having trouble swallowing foods.  Staff is unsure how long this has been going on but they have witness this for the past day.  Pt has not had any food or her medications.  Pt has a DNR at bedside. EMS gave pt about 250 ml of NaCl enroute.

## 2016-06-01 ENCOUNTER — Encounter (HOSPITAL_COMMUNITY): Payer: Self-pay

## 2016-06-01 ENCOUNTER — Emergency Department (HOSPITAL_COMMUNITY)
Admission: EM | Admit: 2016-06-01 | Discharge: 2016-06-01 | Disposition: A | Discharge status: Home / Self Care | Attending: Emergency Medicine | Admitting: Emergency Medicine

## 2016-06-01 DIAGNOSIS — Z7982 Long term (current) use of aspirin: Secondary | ICD-10-CM | POA: Insufficient documentation

## 2016-06-01 DIAGNOSIS — Z79899 Other long term (current) drug therapy: Secondary | ICD-10-CM | POA: Diagnosis not present

## 2016-06-01 DIAGNOSIS — S0303XA Dislocation of jaw, bilateral, initial encounter: Secondary | ICD-10-CM | POA: Diagnosis present

## 2016-06-01 DIAGNOSIS — I5032 Chronic diastolic (congestive) heart failure: Secondary | ICD-10-CM | POA: Insufficient documentation

## 2016-06-01 DIAGNOSIS — X58XXXA Exposure to other specified factors, initial encounter: Secondary | ICD-10-CM | POA: Insufficient documentation

## 2016-06-01 DIAGNOSIS — Y929 Unspecified place or not applicable: Secondary | ICD-10-CM | POA: Insufficient documentation

## 2016-06-01 DIAGNOSIS — J45909 Unspecified asthma, uncomplicated: Secondary | ICD-10-CM | POA: Insufficient documentation

## 2016-06-01 DIAGNOSIS — S0300XA Dislocation of jaw, unspecified side, initial encounter: Secondary | ICD-10-CM

## 2016-06-01 DIAGNOSIS — Y939 Activity, unspecified: Secondary | ICD-10-CM | POA: Insufficient documentation

## 2016-06-01 DIAGNOSIS — Z950 Presence of cardiac pacemaker: Secondary | ICD-10-CM | POA: Diagnosis not present

## 2016-06-01 DIAGNOSIS — Y999 Unspecified external cause status: Secondary | ICD-10-CM | POA: Insufficient documentation

## 2016-06-01 DIAGNOSIS — N189 Chronic kidney disease, unspecified: Secondary | ICD-10-CM | POA: Insufficient documentation

## 2016-06-01 DIAGNOSIS — Z8673 Personal history of transient ischemic attack (TIA), and cerebral infarction without residual deficits: Secondary | ICD-10-CM | POA: Diagnosis not present

## 2016-06-01 DIAGNOSIS — I130 Hypertensive heart and chronic kidney disease with heart failure and stage 1 through stage 4 chronic kidney disease, or unspecified chronic kidney disease: Secondary | ICD-10-CM | POA: Insufficient documentation

## 2016-06-01 DIAGNOSIS — E039 Hypothyroidism, unspecified: Secondary | ICD-10-CM | POA: Diagnosis not present

## 2016-06-01 NOTE — ED Notes (Signed)
Report given to Pryor Montes, RN at [facility].  PTAR called for transport.

## 2016-06-01 NOTE — ED Triage Notes (Signed)
[name], patient from [facility].  Patient wears a chin strap at night to sleep and patient removed the strap and her jaw dislocated.  Per EMS patient has this to happen x2 a week.  BP en route124/56, PR 62, 95%RA, 22 RR, CBG 138.

## 2016-06-01 NOTE — ED Notes (Signed)
Bed: WA17 Expected date:  Expected time:  Means of arrival:  Comments: EMS 83 yo female dislocated jaw

## 2016-06-01 NOTE — ED Provider Notes (Signed)
[facility] DEPT Provider Note   CSN: [id number] Arrival date & time: 06/01/16  0444     History   Chief Complaint Chief Complaint  Patient presents with  . Jaw stuck open   Level V caveat: Dementia  HPI Yvonne Buck is a 83 y.o. female.  HPI Patient is well known to this emergency department with a history of recurrent jaw dislocations.  She presents the emergency department from the nursing home with bilateral mandible dislocation.  Patient is unable to provide additional history secondary to dementia   Past Medical History:  Diagnosis Date  . Anxiety   . Asthma   . Chronic kidney disease   . Depression   . Fracture of greater trochanter of left femur ([place]) 04/02/2013  . Frequent falls   . Hip fracture, left ([place]) 04/02/2013  . Hyperlipidemia   . Hypertension   . Hypothyroidism   . Osteoporosis 06/23/2013  . Pacemaker   . Paroxysmal atrial fibrillation (HCC)    chads2vasc score is at least 6.  She is not a candidate for anticoagulation due to falls.  Her AF burden is low.  . Pelvic fracture ([place]) 06/23/2013  . Rhinitis, allergic 02/26/2014  . Senile osteoporosis 02/26/2014  . Sick sinus syndrome ([place])   . Stroke ([place])   . Tachycardia-bradycardia syndrome [facility])    s/p [employer] PPM implant in [place]  . TMJ (dislocation of temporomandibular joint) 06/16/2014  . Vitamin D deficiency     Patient Active Problem List   Diagnosis Date Noted  . Benign hypertensive heart disease with CHF (congestive heart failure) ([place]) 01/18/2016  . Dysphagia 06/22/2015  . Dementia with behavioral disturbance 04/23/2015  . Vitamin D deficiency 04/23/2015  . Protein-calorie malnutrition ([place]) 04/23/2015  . Insomnia 12/28/2014  . Chronic diastolic congestive heart failure ([place]) 10/15/2014  . Hypertensive heart and renal disease 10/15/2014  . Frequent falls 08/07/2014  . Protein-calorie malnutrition, severe ([place]) 06/22/2014  . Weakness 06/17/2014  . Steroid-induced hyperglycemia  06/17/2014  . TMJ (dislocation of temporomandibular joint) 06/16/2014  . Hypokalemia 06/16/2014  . Sick sinus syndrome ([place]) 03/24/2014  . Hyperlipidemia 02/26/2014  . Senile osteoporosis 02/26/2014  . Asthmatic bronchitis 02/26/2014  . Rhinitis, allergic 02/26/2014  . Chronic asthma 06/16/2013  . Depression with anxiety 02/05/2013  . Gait instability 01/14/2013  . Left spastic hemiparesis ([place]) 11/16/2012  . Tachycardia-bradycardia syndrome ([place]) 01/06/2011  . Atrial fibrillation ([place]) 01/06/2011  . Hypothyroidism 12/10/2010    Past Surgical History:  Procedure Laterality Date  . CHOLECYSTECTOMY    . EP IMPLANTABLE DEVICE N/A 09/03/2015   Gen change with a St Jude Mediacl AssurityMRI DR pacemaker  . GALLBLADDER SURGERY    . PACEMAKER INSERTION  01/04/2005   [employer] [place] PPM [phone]), GDT [phone] atrial lead and 4457 V lead all implanted in [place]      OB History    No data available       Home Medications    Prior to Admission medications   Medication Sig Start Date End Date Taking? Authorizing Provider  acetaminophen (TYLENOL) 325 MG tablet Take 650 mg by mouth every 4 (four) hours as needed for mild pain or moderate pain.    Historical Provider, MD  acetaminophen (TYLENOL) 500 MG tablet Take 1,000 mg by mouth 3 (three) times daily as needed for mild pain or moderate pain.     Historical Provider, MD  amLODipine (NORVASC) 5 MG tablet Take 5 mg by mouth daily.  08/21/14   Historical Provider, MD  aspirin EC 81 MG tablet Take 81 mg by mouth at bedtime. for AFIB    Historical Provider, MD  cholecalciferol (VITAMIN D) 1000 UNITS tablet Take 1,000 Units by mouth at bedtime. for vitamin D deficiency    Historical Provider, MD  Fluticasone-Salmeterol (ADVAIR) 250-50 MCG/DOSE AEPB Inhale 1 puff into the lungs 2 (two) times daily. for SOB/Wheeze. Rinse mouth after use.    Historical Provider, MD  HYDROcodone-acetaminophen  (NORCO/VICODIN) 5-325 MG tablet Take 1 tablet by mouth daily as needed for moderate pain.     Historical Provider, MD  isosorbide mononitrate (IMDUR) 30 MG 24 hr tablet Take 30 mg by mouth every morning. for hypertension    Historical Provider, MD  losartan (COZAAR) 100 MG tablet Take 100 mg by mouth daily.     Historical Provider, MD  memantine (NAMENDA XR) 28 MG CP24 24 hr capsule Take 28 mg by mouth daily.    Historical Provider, MD  metoprolol (LOPRESSOR) 50 MG tablet Take 50 mg by mouth 2 (two) times daily.     Historical Provider, MD  mirtazapine (REMERON) 7.5 MG tablet Take 7.5 mg by mouth at bedtime.    Historical Provider, MD  montelukast (SINGULAIR) 10 MG tablet Take 10 mg by mouth at bedtime. for allergies    Historical Provider, MD  OXYGEN Inhale 2 mLs into the lungs as needed (to keep sats above 90%).     Historical Provider, MD  potassium chloride (K-DUR,KLOR-CON) 10 MEQ tablet Take 10 mEq by mouth daily.    Historical Provider, MD  simvastatin (ZOCOR) 10 MG tablet Take 30 mg by mouth at bedtime.  04/25/15   Historical Provider, MD  UNABLE TO FIND Take 90 mLs by mouth 2 (two) times daily. Med Name: Med Pass    Historical Provider, MD    Family History Family History  Problem Relation Age of Onset  . Diabetes Mother   . High blood pressure Mother   . Heart Problems Father     Social History Social History  Substance Use Topics  . Smoking status: Never Smoker  . Smokeless tobacco: Never Used  . Alcohol use No     Allergies   Penicillins   Review of Systems Review of Systems  Unable to perform ROS: Dementia     Physical Exam Updated Vital Signs BP (!) 103/55 (BP Location: Right Arm)   Pulse 62   Temp 98.5 F (36.9 C) (Axillary)   Resp 12   SpO2 94%   Physical Exam  Constitutional: She is oriented to person, place, and time. She appears well-developed and well-nourished.  HENT:  Head: Normocephalic.  Mouth open with inability to oppose teeth consistent  with bilateral mandible dislocation  Eyes: EOM are normal.  Neck: Normal range of motion.  Pulmonary/Chest: Effort normal.  Abdominal: She exhibits no distension.  Musculoskeletal: Normal range of motion.  Neurological: She is alert and oriented to person, place, and time.  Psychiatric: She has a normal mood and affect.  Nursing note and vitals reviewed.    ED Treatments / Results  Labs (all labs ordered are listed, but only abnormal results are displayed) Labs Reviewed - No data to display  EKG  EKG Interpretation None       Radiology No results found.  Procedures Reduction of dislocation Performed by: Jola Schmidt Authorized by: Jola Schmidt  Patient identity confirmed: arm band Time out: Immediately prior to procedure a "time out" was called to verify the  correct patient, procedure, equipment, support staff and site/side marked as required. Local anesthesia used: no  Anesthesia: Local anesthesia used: no  Sedation: Patient sedated: no Patient tolerance: Patient tolerated the procedure well with no immediate complications Comments: Bilateral Mandible Dislocation reduced without difficulty    (including critical care time)  Medications Ordered in ED Medications - No data to display   Initial Impression / Assessment and Plan / ED Course  I have reviewed the triage vital signs and the nursing notes.  Pertinent labs & imaging results that were available during my care of the patient were reviewed by me and considered in my medical decision making (see chart for details).     Recurrent jaw dislocation.  Reduced without difficulty.  Placed in a [place] collar  Final Clinical Impressions(s) / ED Diagnoses   Final diagnoses:  Jaw dislocation, initial encounter    New Prescriptions New Prescriptions   No medications on file     Jola Schmidt, MD 06/01/16 0500

## 2016-06-01 NOTE — ED Notes (Signed)
Attempted report to [facility].  No answer at this time.  This RN will attempt to make contact again.

## 2016-06-01 NOTE — ED Notes (Addendum)
Patient presents with mouth open with difficulty closing.  Breathing even and unlabored.  NAD at this time.

## 2016-06-06 ENCOUNTER — Encounter (HOSPITAL_COMMUNITY): Payer: Self-pay | Admitting: Emergency Medicine

## 2016-06-06 ENCOUNTER — Emergency Department (HOSPITAL_COMMUNITY)
Admission: EM | Admit: 2016-06-06 | Discharge: 2016-06-06 | Disposition: A | Discharge status: Home / Self Care | Payer: No Typology Code available for payment source | Attending: Emergency Medicine | Admitting: Emergency Medicine

## 2016-06-06 DIAGNOSIS — Y999 Unspecified external cause status: Secondary | ICD-10-CM | POA: Diagnosis not present

## 2016-06-06 DIAGNOSIS — Y939 Activity, unspecified: Secondary | ICD-10-CM | POA: Insufficient documentation

## 2016-06-06 DIAGNOSIS — Z8673 Personal history of transient ischemic attack (TIA), and cerebral infarction without residual deficits: Secondary | ICD-10-CM | POA: Insufficient documentation

## 2016-06-06 DIAGNOSIS — Y929 Unspecified place or not applicable: Secondary | ICD-10-CM | POA: Insufficient documentation

## 2016-06-06 DIAGNOSIS — I5032 Chronic diastolic (congestive) heart failure: Secondary | ICD-10-CM | POA: Diagnosis not present

## 2016-06-06 DIAGNOSIS — N189 Chronic kidney disease, unspecified: Secondary | ICD-10-CM | POA: Insufficient documentation

## 2016-06-06 DIAGNOSIS — S0300XA Dislocation of jaw, unspecified side, initial encounter: Secondary | ICD-10-CM | POA: Insufficient documentation

## 2016-06-06 DIAGNOSIS — Z79899 Other long term (current) drug therapy: Secondary | ICD-10-CM | POA: Insufficient documentation

## 2016-06-06 DIAGNOSIS — Z7982 Long term (current) use of aspirin: Secondary | ICD-10-CM | POA: Diagnosis not present

## 2016-06-06 DIAGNOSIS — X58XXXA Exposure to other specified factors, initial encounter: Secondary | ICD-10-CM | POA: Insufficient documentation

## 2016-06-06 DIAGNOSIS — Z950 Presence of cardiac pacemaker: Secondary | ICD-10-CM | POA: Diagnosis not present

## 2016-06-06 DIAGNOSIS — E039 Hypothyroidism, unspecified: Secondary | ICD-10-CM | POA: Diagnosis not present

## 2016-06-06 DIAGNOSIS — I130 Hypertensive heart and chronic kidney disease with heart failure and stage 1 through stage 4 chronic kidney disease, or unspecified chronic kidney disease: Secondary | ICD-10-CM | POA: Insufficient documentation

## 2016-06-06 NOTE — Discharge Instructions (Signed)
Continue jaw supports. Follow up as needed

## 2016-06-06 NOTE — ED Notes (Signed)
Bed: MH68 Expected date:  Expected time:  Means of arrival:  Comments: EMS jaw dislocation

## 2016-06-06 NOTE — ED Notes (Signed)
PTAR notified about transfer to [facility].

## 2016-06-06 NOTE — ED Provider Notes (Signed)
[facility] DEPT Provider Note   CSN: [id number] Arrival date & time: 06/06/16  1059     History   Chief Complaint Chief Complaint  Patient presents with  . jaw dislocation    HPI Yvonne Buck is a 83 y.o. female.  HPI Yvonne Buck is a 83 y.o. female with hx of recurrent jaw dislocation presents to ED with recurrent jaw dislocations, presents to ED with recurrent dislocation. Pt is unable to provide any history due to her at baseline dementia. No other complaints reported by EMS.   Past Medical History:  Diagnosis Date  . Anxiety   . Asthma   . Chronic kidney disease   . Depression   . Fracture of greater trochanter of left femur ([place]) 04/02/2013  . Frequent falls   . Hip fracture, left ([place]) 04/02/2013  . Hyperlipidemia   . Hypertension   . Hypothyroidism   . Osteoporosis 06/23/2013  . Pacemaker   . Paroxysmal atrial fibrillation (HCC)    chads2vasc score is at least 6.  She is not a candidate for anticoagulation due to falls.  Her AF burden is low.  . Pelvic fracture ([place]) 06/23/2013  . Rhinitis, allergic 02/26/2014  . Senile osteoporosis 02/26/2014  . Sick sinus syndrome ([place])   . Stroke ([place])   . Tachycardia-bradycardia syndrome [facility])    s/p [employer] PPM implant in [place]  . TMJ (dislocation of temporomandibular joint) 06/16/2014  . Vitamin D deficiency     Patient Active Problem List   Diagnosis Date Noted  . Benign hypertensive heart disease with CHF (congestive heart failure) ([place]) 01/18/2016  . Dysphagia 06/22/2015  . Dementia with behavioral disturbance 04/23/2015  . Vitamin D deficiency 04/23/2015  . Protein-calorie malnutrition ([place]) 04/23/2015  . Insomnia 12/28/2014  . Chronic diastolic congestive heart failure ([place]) 10/15/2014  . Hypertensive heart and renal disease 10/15/2014  . Frequent falls 08/07/2014  . Protein-calorie malnutrition, severe ([place]) 06/22/2014  . Weakness 06/17/2014  . Steroid-induced hyperglycemia 06/17/2014  . TMJ  (dislocation of temporomandibular joint) 06/16/2014  . Hypokalemia 06/16/2014  . Sick sinus syndrome ([place]) 03/24/2014  . Hyperlipidemia 02/26/2014  . Senile osteoporosis 02/26/2014  . Asthmatic bronchitis 02/26/2014  . Rhinitis, allergic 02/26/2014  . Chronic asthma 06/16/2013  . Depression with anxiety 02/05/2013  . Gait instability 01/14/2013  . Left spastic hemiparesis ([place]) 11/16/2012  . Tachycardia-bradycardia syndrome ([place]) 01/06/2011  . Atrial fibrillation ([place]) 01/06/2011  . Hypothyroidism 12/10/2010    Past Surgical History:  Procedure Laterality Date  . CHOLECYSTECTOMY    . EP IMPLANTABLE DEVICE N/A 09/03/2015   Gen change with a St Jude Mediacl AssurityMRI DR pacemaker  . GALLBLADDER SURGERY    . PACEMAKER INSERTION  01/04/2005   [employer] [place] PPM 1290 [phone]), GDT [phone] atrial lead and 4457 V lead all implanted in [place]      OB History    No data available       Home Medications    Prior to Admission medications   Medication Sig Start Date End Date Taking? Authorizing Provider  acetaminophen (TYLENOL) 325 MG tablet Take 650 mg by mouth every 4 (four) hours as needed for mild pain or moderate pain.    Historical Provider, MD  acetaminophen (TYLENOL) 500 MG tablet Take 1,000 mg by mouth 3 (three) times daily as needed for mild pain or moderate pain.     Historical Provider, MD  amLODipine (NORVASC) 5 MG tablet Take 5 mg by mouth daily.  08/21/14   Historical Provider, MD  aspirin EC 81 MG tablet Take 81 mg by mouth at bedtime. for AFIB    Historical Provider, MD  cholecalciferol (VITAMIN D) 1000 UNITS tablet Take 1,000 Units by mouth at bedtime. for vitamin D deficiency    Historical Provider, MD  Fluticasone-Salmeterol (ADVAIR) 250-50 MCG/DOSE AEPB Inhale 1 puff into the lungs 2 (two) times daily. for SOB/Wheeze. Rinse mouth after use.    Historical Provider, MD  HYDROcodone-acetaminophen (NORCO/VICODIN) 5-325 MG  tablet Take 1 tablet by mouth daily as needed for moderate pain.     Historical Provider, MD  isosorbide mononitrate (IMDUR) 30 MG 24 hr tablet Take 30 mg by mouth every morning. for hypertension    Historical Provider, MD  losartan (COZAAR) 100 MG tablet Take 100 mg by mouth daily.     Historical Provider, MD  memantine (NAMENDA XR) 28 MG CP24 24 hr capsule Take 28 mg by mouth daily.    Historical Provider, MD  metoprolol (LOPRESSOR) 50 MG tablet Take 50 mg by mouth 2 (two) times daily.     Historical Provider, MD  mirtazapine (REMERON) 7.5 MG tablet Take 7.5 mg by mouth at bedtime.    Historical Provider, MD  montelukast (SINGULAIR) 10 MG tablet Take 10 mg by mouth at bedtime. for allergies    Historical Provider, MD  OXYGEN Inhale 2 mLs into the lungs as needed (to keep sats above 90%).     Historical Provider, MD  potassium chloride (K-DUR,KLOR-CON) 10 MEQ tablet Take 10 mEq by mouth daily.    Historical Provider, MD  simvastatin (ZOCOR) 10 MG tablet Take 30 mg by mouth at bedtime.  04/25/15   Historical Provider, MD  UNABLE TO FIND Take 90 mLs by mouth 2 (two) times daily. Med Name: Med Pass    Historical Provider, MD    Family History Family History  Problem Relation Age of Onset  . Diabetes Mother   . High blood pressure Mother   . Heart Problems Father     Social History Social History  Substance Use Topics  . Smoking status: Never Smoker  . Smokeless tobacco: Never Used  . Alcohol use No     Allergies   Penicillins   Review of Systems Review of Systems  Unable to perform ROS: Patient nonverbal     Physical Exam Updated Vital Signs BP (!) 180/72 (BP Location: Left Arm)   Pulse 60   Temp 98 F (36.7 C) (Oral)   Resp 15   SpO2 95%   Physical Exam  Constitutional: She appears well-developed and well-nourished. No distress.  HENT:  Mouth held wide open. Unable to approximate teeth. Obvious mandible dislocation.   Eyes: Conjunctivae are normal.  Neck: Neck  supple.  Neurological: She is alert.  Skin: Skin is warm and dry.  Nursing note and vitals reviewed.    ED Treatments / Results  Labs (all labs ordered are listed, but only abnormal results are displayed) Labs Reviewed - No data to display  EKG  EKG Interpretation None       Radiology No results found.  Procedures Reduction of dislocation Date/Time: 06/06/2016 3:57 PM Performed by: Jeannett Senior Authorized by: Jeannett Senior  Risks and benefits: risks, benefits and alternatives were discussed Patient identity confirmed: arm band Time out: Immediately prior to procedure a "time out" was called to verify the correct patient, procedure, equipment, support staff and site/side marked as required. Local anesthesia used: no  Anesthesia: Local anesthesia used: no  Sedation: Patient sedated: no Patient tolerance: Patient tolerated the procedure well with no immediate complications Comments: External manipulation technique    (including critical care time)   Medications Ordered in ED Medications - No data to display   Initial Impression / Assessment and Plan / ED Course  I have reviewed the triage vital signs and the nursing notes.  Pertinent labs & imaging results that were available during my care of the patient were reviewed by me and considered in my medical decision making (see chart for details).     Pt in ed with recurrent mandible dislocation. Reduced using external traction and manipulation. Jaw reduced easily. Home with follow up as needed.   Vitals:   06/06/16 1114  BP: (!) 180/72  Pulse: 60  Resp: 15  Temp: 98 F (36.7 C)  TempSrc: Oral  SpO2: 95%     Final Clinical Impressions(s) / ED Diagnoses   Final diagnoses:  Closed dislocation of jaw, initial encounter    New Prescriptions Discharge Medication List as of 06/06/2016 11:36 AM       Jeannett Senior, PA-C 06/06/16 1558    Lacretia Leigh, MD 06/07/16 [phone]

## 2016-06-06 NOTE — ED Triage Notes (Signed)
Per [facility] patient from [facility] for jaw dislocation. Patient has TMJ and becomes chronic problem. Patient is non-verbal.

## 2016-06-07 ENCOUNTER — Encounter (HOSPITAL_COMMUNITY): Payer: Self-pay | Admitting: *Deleted

## 2016-06-07 ENCOUNTER — Emergency Department (HOSPITAL_COMMUNITY)
Admission: EM | Admit: 2016-06-07 | Discharge: 2016-06-07 | Disposition: A | Discharge status: Home / Self Care | Payer: No Typology Code available for payment source | Attending: Emergency Medicine | Admitting: Emergency Medicine

## 2016-06-07 DIAGNOSIS — Z950 Presence of cardiac pacemaker: Secondary | ICD-10-CM | POA: Diagnosis not present

## 2016-06-07 DIAGNOSIS — S0300XA Dislocation of jaw, unspecified side, initial encounter: Secondary | ICD-10-CM

## 2016-06-07 DIAGNOSIS — N189 Chronic kidney disease, unspecified: Secondary | ICD-10-CM | POA: Diagnosis not present

## 2016-06-07 DIAGNOSIS — Y999 Unspecified external cause status: Secondary | ICD-10-CM | POA: Insufficient documentation

## 2016-06-07 DIAGNOSIS — X58XXXA Exposure to other specified factors, initial encounter: Secondary | ICD-10-CM | POA: Insufficient documentation

## 2016-06-07 DIAGNOSIS — E039 Hypothyroidism, unspecified: Secondary | ICD-10-CM | POA: Insufficient documentation

## 2016-06-07 DIAGNOSIS — I5032 Chronic diastolic (congestive) heart failure: Secondary | ICD-10-CM | POA: Insufficient documentation

## 2016-06-07 DIAGNOSIS — Y929 Unspecified place or not applicable: Secondary | ICD-10-CM | POA: Diagnosis not present

## 2016-06-07 DIAGNOSIS — I130 Hypertensive heart and chronic kidney disease with heart failure and stage 1 through stage 4 chronic kidney disease, or unspecified chronic kidney disease: Secondary | ICD-10-CM | POA: Diagnosis not present

## 2016-06-07 DIAGNOSIS — Z7982 Long term (current) use of aspirin: Secondary | ICD-10-CM | POA: Insufficient documentation

## 2016-06-07 DIAGNOSIS — Y939 Activity, unspecified: Secondary | ICD-10-CM | POA: Insufficient documentation

## 2016-06-07 DIAGNOSIS — Z8673 Personal history of transient ischemic attack (TIA), and cerebral infarction without residual deficits: Secondary | ICD-10-CM | POA: Diagnosis not present

## 2016-06-07 DIAGNOSIS — Z79899 Other long term (current) drug therapy: Secondary | ICD-10-CM | POA: Insufficient documentation

## 2016-06-07 NOTE — ED Notes (Signed)
Gave pt additional apple juice to drink.  No distress.  EMS is here to pick pt up and transport her back

## 2016-06-07 NOTE — ED Provider Notes (Signed)
[place] DEPT Provider Note   CSN: [id number] Arrival date & time: 06/07/16  1157     History   Chief Complaint Chief Complaint  Patient presents with  . Jaw Pain    Jaw dislocation    HPI Yvonne Buck is a 83 y.o. female.  HPI    83 yo F with PMHx recurrent jaw dislocations, severe dementia here with jaw dislocation. Pt wears chin strap at facility but intermittently takes it off. This occurred just prior to arrival while eating. History of the same and hospice has tried to have RN reduce at facility, but no RN available today. No falls or trauma. No recent illnesses.  Level 5 caveat invoked as remainder of history, ROS, and physical exam limited due to patient's dementia.   Past Medical History:  Diagnosis Date  . Anxiety   . Asthma   . Chronic kidney disease   . Depression   . Fracture of greater trochanter of left femur ([place]) 04/02/2013  . Frequent falls   . Hip fracture, left ([place]) 04/02/2013  . Hyperlipidemia   . Hypertension   . Hypothyroidism   . Osteoporosis 06/23/2013  . Pacemaker   . Paroxysmal atrial fibrillation (HCC)    chads2vasc score is at least 6.  She is not a candidate for anticoagulation due to falls.  Her AF burden is low.  . Pelvic fracture ([place]) 06/23/2013  . Rhinitis, allergic 02/26/2014  . Senile osteoporosis 02/26/2014  . Sick sinus syndrome ([place])   . Stroke ([place])   . Tachycardia-bradycardia syndrome [facility])    s/p [employer] PPM implant in [place]  . TMJ (dislocation of temporomandibular joint) 06/16/2014  . Vitamin D deficiency     Patient Active Problem List   Diagnosis Date Noted  . Benign hypertensive heart disease with CHF (congestive heart failure) ([place]) 01/18/2016  . Dysphagia 06/22/2015  . Dementia with behavioral disturbance 04/23/2015  . Vitamin D deficiency 04/23/2015  . Protein-calorie malnutrition ([place]) 04/23/2015  . Insomnia 12/28/2014  . Chronic diastolic congestive heart failure ([place]) 10/15/2014  . Hypertensive  heart and renal disease 10/15/2014  . Frequent falls 08/07/2014  . Protein-calorie malnutrition, severe ([place]) 06/22/2014  . Weakness 06/17/2014  . Steroid-induced hyperglycemia 06/17/2014  . TMJ (dislocation of temporomandibular joint) 06/16/2014  . Hypokalemia 06/16/2014  . Sick sinus syndrome ([place]) 03/24/2014  . Hyperlipidemia 02/26/2014  . Senile osteoporosis 02/26/2014  . Asthmatic bronchitis 02/26/2014  . Rhinitis, allergic 02/26/2014  . Chronic asthma 06/16/2013  . Depression with anxiety 02/05/2013  . Gait instability 01/14/2013  . Left spastic hemiparesis ([place]) 11/16/2012  . Tachycardia-bradycardia syndrome ([place]) 01/06/2011  . Atrial fibrillation ([place]) 01/06/2011  . Hypothyroidism 12/10/2010    Past Surgical History:  Procedure Laterality Date  . CHOLECYSTECTOMY    . EP IMPLANTABLE DEVICE N/A 09/03/2015   Gen change with a St Jude Mediacl AssurityMRI DR pacemaker  . GALLBLADDER SURGERY    . PACEMAKER INSERTION  01/04/2005   [employer] [place] PPM 1290 [phone]), GDT [phone] atrial lead and 4457 V lead all implanted in [place]      OB History    No data available       Home Medications    Prior to Admission medications   Medication Sig Start Date End Date Taking? Authorizing Provider  acetaminophen (TYLENOL) 325 MG tablet Take 650 mg by mouth every 4 (four) hours as needed for mild pain or moderate pain.    Historical Provider, MD  acetaminophen (  TYLENOL) 500 MG tablet Take 1,000 mg by mouth 3 (three) times daily as needed for mild pain or moderate pain.     Historical Provider, MD  amLODipine (NORVASC) 5 MG tablet Take 5 mg by mouth daily.  08/21/14   Historical Provider, MD  aspirin EC 81 MG tablet Take 81 mg by mouth at bedtime. for AFIB    Historical Provider, MD  cholecalciferol (VITAMIN D) 1000 UNITS tablet Take 1,000 Units by mouth at bedtime. for vitamin D deficiency    Historical Provider, MD  Fluticasone-Salmeterol  (ADVAIR) 250-50 MCG/DOSE AEPB Inhale 1 puff into the lungs 2 (two) times daily. for SOB/Wheeze. Rinse mouth after use.    Historical Provider, MD  HYDROcodone-acetaminophen (NORCO/VICODIN) 5-325 MG tablet Take 1 tablet by mouth daily as needed for moderate pain.     Historical Provider, MD  isosorbide mononitrate (IMDUR) 30 MG 24 hr tablet Take 30 mg by mouth every morning. for hypertension    Historical Provider, MD  losartan (COZAAR) 100 MG tablet Take 100 mg by mouth daily.     Historical Provider, MD  memantine (NAMENDA XR) 28 MG CP24 24 hr capsule Take 28 mg by mouth daily.    Historical Provider, MD  metoprolol (LOPRESSOR) 50 MG tablet Take 50 mg by mouth 2 (two) times daily.     Historical Provider, MD  mirtazapine (REMERON) 7.5 MG tablet Take 7.5 mg by mouth at bedtime.    Historical Provider, MD  montelukast (SINGULAIR) 10 MG tablet Take 10 mg by mouth at bedtime. for allergies    Historical Provider, MD  OXYGEN Inhale 2 mLs into the lungs as needed (to keep sats above 90%).     Historical Provider, MD  potassium chloride (K-DUR,KLOR-CON) 10 MEQ tablet Take 10 mEq by mouth daily.    Historical Provider, MD  simvastatin (ZOCOR) 10 MG tablet Take 30 mg by mouth at bedtime.  04/25/15   Historical Provider, MD  UNABLE TO FIND Take 90 mLs by mouth 2 (two) times daily. Med Name: Med Pass    Historical Provider, MD    Family History Family History  Problem Relation Age of Onset  . Diabetes Mother   . High blood pressure Mother   . Heart Problems Father     Social History Social History  Substance Use Topics  . Smoking status: Never Smoker  . Smokeless tobacco: Never Used  . Alcohol use No     Allergies   Penicillins   Review of Systems Review of Systems  Unable to perform ROS: Dementia     Physical Exam Updated Vital Signs BP (!) 168/84   Pulse 78   Temp 98.4 F (36.9 C) (Oral)   Resp 18   SpO2 96%   Physical Exam  Constitutional: She is oriented to person, place,  and time. She appears well-developed and well-nourished. No distress.  HENT:  Head: Normocephalic and atraumatic.  Apparent anterior inferior dislocation of mandible with open mouth and inability to close mouth. No bleeding. No open wounds.  Eyes: Conjunctivae are normal.  Neck: Neck supple.  Cardiovascular: Normal rate, regular rhythm and normal heart sounds.  Exam reveals no friction rub.   No murmur heard. Pulmonary/Chest: Effort normal and breath sounds normal. No respiratory distress. She has no wheezes. She has no rales.  Abdominal: She exhibits no distension.  Musculoskeletal: She exhibits no edema.  Neurological: She is alert and oriented to person, place, and time. She exhibits normal muscle tone.  Skin: Skin is warm.  Capillary refill takes less than 2 seconds.  Psychiatric: She has a normal mood and affect.  Nursing note and vitals reviewed.    ED Treatments / Results  Labs (all labs ordered are listed, but only abnormal results are displayed) Labs Reviewed - No data to display  EKG  EKG Interpretation None       Radiology No results found.  Procedures Reduction of dislocation Date/Time: 06/07/2016 4:47 PM Performed by: Duffy Bruce Authorized by: Duffy Bruce  Consent: The procedure was performed in an emergent situation. Required items: required blood products, implants, devices, and special equipment available Patient identity confirmed: arm band Time out: Immediately prior to procedure a "time out" was called to verify the correct patient, procedure, equipment, support staff and site/side marked as required. Preparation: Patient was prepped and draped in the usual sterile fashion. Local anesthesia used: no  Anesthesia: Local anesthesia used: no  Sedation: Patient sedated: no Patient tolerance: Patient tolerated the procedure well with no immediate complications Comments: Jaw reduced with external manipulation (traction/rotation) with easy  reduction. Patient closes mouth spontaneously w/o difficulty after reduction.    (including critical care time)  Medications Ordered in ED Medications - No data to display   Initial Impression / Assessment and Plan / ED Course  I have reviewed the triage vital signs and the nursing notes.  Pertinent labs & imaging results that were available during my care of the patient were reviewed by me and considered in my medical decision making (see chart for details).     83 yo F with h/o recurrent jaw dislocations here with recurrent dislocation. No trauma. Demented so unable to provide history. Jaw dislocated as above uneventfully. Pt closing mouth without difficulty. Notified hospice and will continue conservative attempts to prevent this at the facility. No other acute issues.  Final Clinical Impressions(s) / ED Diagnoses   Final diagnoses:  Dislocation of temporomandibular joint, initial encounter    New Prescriptions Discharge Medication List as of 06/07/2016 12:36 PM       Duffy Bruce, MD 06/07/16 [phone]

## 2016-06-07 NOTE — ED Notes (Signed)
[facility] arrived.  Informs me that they are not allowed to transport [facility] pt.  Paramedic is calling [facility] to arrange for transport

## 2016-06-07 NOTE — ED Triage Notes (Signed)
Called Hospice to notify then that pt had come in as [facility] prompted me to do.  Informed them that Dr. Ellender Hose has reduced the dislocated jaw.  Suggested to them getting pt a chin strap (I checked and there seem to be options available at [place], [facility] and [place]).

## 2016-06-07 NOTE — ED Notes (Signed)
Pt transported back with ems

## 2016-06-07 NOTE — ED Notes (Signed)
Gave pt apple juice per her request as she is thirsty.  Will continue to monitor

## 2016-06-07 NOTE — ED Notes (Signed)
Call to [facility] for transport back to [facility]

## 2016-06-07 NOTE — ED Notes (Signed)
Pt jaw was reduced by Dr. Ellender Hose.  Placed coban to hold it in place at this time and Dr. Ellender Hose placed the c-collar to give further support. Call to Hospice at this time, currently on hold

## 2016-06-08 ENCOUNTER — Encounter: Payer: Self-pay | Admitting: Family

## 2016-06-08 ENCOUNTER — Non-Acute Institutional Stay (SKILLED_NURSING_FACILITY): Payer: Medicare Other | Admitting: Family

## 2016-06-08 DIAGNOSIS — E876 Hypokalemia: Secondary | ICD-10-CM

## 2016-06-08 DIAGNOSIS — E782 Mixed hyperlipidemia: Secondary | ICD-10-CM | POA: Diagnosis not present

## 2016-06-08 DIAGNOSIS — F418 Other specified anxiety disorders: Secondary | ICD-10-CM

## 2016-06-08 NOTE — Progress Notes (Signed)
Location:  [facility] Room Number: 1005-A Place of Service:  SNF (31) Provider: Dinah Ngetich FNP-C   Blanchie Serve, MD  Patient Care Team: Blanchie Serve, MD as PCP - General (Internal Medicine) Gerlene Fee, NP as Nurse Practitioner (Nurse Practitioner)  Extended Emergency Contact Information Primary Emergency Contact: [facility] Address: [address] [place], [place] [address] Johnnette Litter of [place] Phone: [phone] Mobile Phone: [phone] Relation: Daughter Secondary Emergency Contact: Rodriguez,Jorge Address: [address]          Rondall Allegra, [place] of [place] Mobile Phone: [phone] Relation: Yolanda Bonine  Code Status:  DNR  Goals of care: Advanced Directive information Advanced Directives 06/07/2016  Does Patient Have a Medical Advance Directive? No  Type of Advance Directive (No Data)  Does patient want to make changes to medical advance directive? -  Copy of [place] in Chart? -  Would patient like information on creating a medical advance directive? -  Pre-existing out of facility DNR order (yellow form or pink MOST form) -     Chief Complaint  Patient presents with  . Medical Management of Chronic Issues    Routine Visit     HPI:  Pt is a 83 y.o. female seen today at [facility] for an acute visit for  Evaluation of cough. She has a medical history of HTN, CHF, TMJ,Sick sinus syndrome, Afib, Dementia with behavioral disturbance,Depression among other conditions. She is seen in her room today. She continues to require multiple  intermittent ED visit due to chronic lower jaw dislocation. Hospice service continue to follow up.No recent weight changes or fall episode since prior visit.unable to provide HPI and ROS due to dementia.   Past Medical History:  Diagnosis Date  . Anxiety   . Asthma   . Chronic kidney disease   .  Depression   . Fracture of greater trochanter of left femur ([place]) 04/02/2013  . Frequent falls   . Hip fracture, left ([place]) 04/02/2013  . Hyperlipidemia   . Hypertension   . Hypothyroidism   . Osteoporosis 06/23/2013  . Pacemaker   . Paroxysmal atrial fibrillation (HCC)    chads2vasc score is at least 6.  She is not a candidate for anticoagulation due to falls.  Her AF burden is low.  . Pelvic fracture ([place]) 06/23/2013  . Rhinitis, allergic 02/26/2014  . Senile osteoporosis 02/26/2014  . Sick sinus syndrome ([place])   . Stroke ([place])   . Tachycardia-bradycardia syndrome [facility])    s/p [employer] PPM implant in [place]  . TMJ (dislocation of temporomandibular joint) 06/16/2014  . Vitamin D deficiency    Past Surgical History:  Procedure Laterality Date  . CHOLECYSTECTOMY    . EP IMPLANTABLE DEVICE N/A 09/03/2015   Gen change with a St Jude Mediacl AssurityMRI DR pacemaker  . GALLBLADDER SURGERY    . PACEMAKER INSERTION  01/04/2005   [employer] PPM 1290 [phone]), GDT [id number] atrial lead and 4457 V lead all implanted in [place]  . TONSILLECTOMY AND ADENOIDECTOMY      Allergies  Allergen Reactions  . Penicillins Other (See Comments)    Red spots, ### Tolerated Rocephin 12/2012 ###, Has patient had a PCN reaction causing immediate rash, facial/tongue/throat swelling, SOB or lightheadedness with hypotension: Unknown Has patient had a PCN reaction causing severe rash involving mucus membranes or skin necrosis: Unknown Has  patient had a PCN reaction that required hospitalization Unknown Has patient had a PCN reaction occurring within the last 10 years: Unknown If all of the above answers are "NO", then may proceed with Cephalosporin u    Allergies as of 06/08/2016      Reactions   Penicillins Other (See Comments)   Red spots, ### Tolerated Rocephin 12/2012 ###, Has patient had a PCN reaction causing immediate rash, facial/tongue/throat swelling, SOB or lightheadedness with hypotension:  Unknown Has patient had a PCN reaction causing severe rash involving mucus membranes or skin necrosis: Unknown Has patient had a PCN reaction that required hospitalization Unknown Has patient had a PCN reaction occurring within the last 10 years: Unknown If all of the above answers are "NO", then may proceed with Cephalosporin u      Medication List       Accurate as of 06/08/16 10:15 PM. Always use your most recent med list.          acetaminophen 500 MG tablet Commonly known as:  TYLENOL Take 1,000 mg by mouth 3 (three) times daily as needed for mild pain or moderate pain.   acetaminophen 325 MG tablet Commonly known as:  TYLENOL Take 650 mg by mouth every 4 (four) hours as needed for mild pain or moderate pain.   amLODipine 5 MG tablet Commonly known as:  NORVASC Take 5 mg by mouth daily.   aspirin EC 81 MG tablet Take 81 mg by mouth at bedtime. for AFIB   cholecalciferol 1000 units tablet Commonly known as:  VITAMIN D Take 1,000 Units by mouth at bedtime. for vitamin D deficiency   Fluticasone-Salmeterol 250-50 MCG/DOSE Aepb Commonly known as:  ADVAIR Inhale 1 puff into the lungs 2 (two) times daily. for SOB/Wheeze. Rinse mouth after use.   isosorbide mononitrate 30 MG 24 hr tablet Commonly known as:  IMDUR Take 30 mg by mouth every morning. for hypertension   losartan 100 MG tablet Commonly known as:  COZAAR Take 100 mg by mouth daily.   metoprolol 50 MG tablet Commonly known as:  LOPRESSOR Take 50 mg by mouth 2 (two) times daily.   mirtazapine 7.5 MG tablet Commonly known as:  REMERON Take 7.5 mg by mouth at bedtime.   montelukast 10 MG tablet Commonly known as:  SINGULAIR Take 10 mg by mouth at bedtime. for allergies   morphine 20 MG/ML concentrated solution Commonly known as:  ROXANOL Take 5 mg by mouth every 8 (eight) hours as needed for severe pain.   multivitamin tablet Take 1 tablet by mouth daily.   NAMENDA XR 28 MG Cp24 24 hr  capsule Generic drug:  memantine Take 28 mg by mouth daily.   OXYGEN Inhale 2 mLs into the lungs as needed (to keep sats above 90%).   potassium chloride 10 MEQ tablet Commonly known as:  K-DUR,KLOR-CON Take 10 mEq by mouth daily.   simvastatin 10 MG tablet Commonly known as:  ZOCOR Take 30 mg by mouth at bedtime.   UNABLE TO FIND Take 90 mLs by mouth 2 (two) times daily. Med Name: Med Pass       Review of Systems  Unable to perform ROS: Dementia    Immunization History  Administered Date(s) Administered  . Influenza-Unspecified 11/28/2013, 12/03/2014  . PPD Test 01/03/2013, 01/18/2013, 04/19/2013, 06/25/2014, 10/29/2014  . Pneumococcal-Unspecified 02/01/2013   Pertinent  Health Maintenance Due  Topic Date Due  . INFLUENZA VACCINE  02/21/2017 (Originally 09/21/2016)  . DEXA SCAN  02/21/2018 (Originally  10/04/1996)  . PNA vac Low Risk Adult (2 of 2 - PCV13) 02/21/2018 (Originally 02/01/2014)   Fall Risk  12/10/2010  Risk for fall due to : History of fall(s);Impaired balance/gait    Vitals:   06/08/16 1207  BP: (!) 153/78  Pulse: 63  Resp: 20  Temp: 97.4 F (36.3 C)  TempSrc: Oral  SpO2: 94%  Weight: 89 lb (40.4 kg)  Height: 5\' 5"  (1.651 m)   Body mass index is 14.81 kg/m. Physical Exam  Constitutional: She appears well-developed.  Thin frail elderly in no acute distress   HENT:  Head: Normocephalic.  Mouth/Throat: Oropharynx is clear and moist. No oropharyngeal exudate.  Eyes: Conjunctivae and EOM are normal. Pupils are equal, round, and reactive to light. Right eye exhibits no discharge. Left eye exhibits no discharge. No scleral icterus.  Neck: Neck supple. No JVD present.  Cardiovascular: Normal rate, regular rhythm, normal heart sounds and intact distal pulses.  Exam reveals no gallop and no friction rub.   No murmur heard. Pulmonary/Chest: Effort normal and breath sounds normal. No respiratory distress. She has no wheezes.  Abdominal: Soft. Bowel  sounds are normal. She exhibits no distension. There is no tenderness. There is no rebound and no guarding.  Genitourinary:  Genitourinary Comments: Incontinent both bowel and bladder   Musculoskeletal: She exhibits no edema, tenderness or deformity.  Moves x 4 extremities without any difficulties.   Lymphadenopathy:    She has no cervical adenopathy.  Neurological:  Alert and orient to person   Skin: Skin is warm and dry. No rash noted. No erythema. No pallor.  No skin breakdown   Psychiatric: She has a normal mood and affect.    Labs reviewed:  Recent Labs  09/03/15 1230 09/03/15 1948  02/20/16 05/05/16 05/23/16 1622  NA 141 136  < > 138 143 147*  K 3.6 3.4*  < > 4.3 4.6 3.2*  CL 102 102  --   --   --  111  CO2 30 28  --   --   --  29  GLUCOSE 112* 97  --   --   --  89  BUN 15 11  < > 15 17 24*  CREATININE 0.58 0.51  < > 0.6 0.6 0.51  CALCIUM 9.6 8.8*  --   --   --  8.9  < > = values in this interval not displayed.  Recent Labs  11/05/15 05/05/16 05/23/16 1622  AST 15 22 15   ALT 22 21 14   ALKPHOS 70 75 67  BILITOT  --   --  0.8  PROT  --   --  5.9*  ALBUMIN  --   --  3.0*    Recent Labs  09/03/15 1230 09/03/15 1948  02/20/16 05/05/16 05/23/16 1622  WBC 9.5 8.1  < > 8.8 8.3 8.7  NEUTROABS  --  4.5  --   --   --  5.6  HGB 16.5* 14.6  < > 12.8 14.1 12.4  HCT 52.9* 46.2*  < > 40 46 39.1  MCV 91.4 89.9  --   --   --  90.1  PLT 292 280  < > 301 365 302  < > = values in this interval not displayed. Lab Results  Component Value Date   TSH 0.30 (A) 04/06/2016   Lab Results  Component Value Date   HGBA1C 6.1 03/31/2015   Lab Results  Component Value Date   CHOL 123 04/06/2016   HDL 39  04/06/2016   LDLCALC 60 04/06/2016   LDLDIRECT 83 12/10/2010   TRIG 116 04/06/2016   Assessment/Plan Hypokalemia  Continue potassium supplements. Continue to monitor BMP.   Depression with anxiety Stable.continue on Remeron.continue to monitor mood changes.     Hyperlipidemia  Continue Simvastatin. Monitor lipid panel periodically.       Family/ staff Communication: Reviewed plan of care with patient and facility Nurse supervisor.   Labs/tests ordered:   None

## 2016-06-13 ENCOUNTER — Emergency Department (HOSPITAL_COMMUNITY)
Admission: EM | Admit: 2016-06-13 | Discharge: 2016-06-13 | Disposition: A | Discharge status: Home / Self Care | Attending: Emergency Medicine | Admitting: Emergency Medicine

## 2016-06-13 ENCOUNTER — Encounter (HOSPITAL_COMMUNITY): Payer: Self-pay

## 2016-06-13 DIAGNOSIS — Z7982 Long term (current) use of aspirin: Secondary | ICD-10-CM | POA: Diagnosis not present

## 2016-06-13 DIAGNOSIS — Z8673 Personal history of transient ischemic attack (TIA), and cerebral infarction without residual deficits: Secondary | ICD-10-CM | POA: Diagnosis not present

## 2016-06-13 DIAGNOSIS — Z950 Presence of cardiac pacemaker: Secondary | ICD-10-CM | POA: Insufficient documentation

## 2016-06-13 DIAGNOSIS — E039 Hypothyroidism, unspecified: Secondary | ICD-10-CM | POA: Diagnosis not present

## 2016-06-13 DIAGNOSIS — Y939 Activity, unspecified: Secondary | ICD-10-CM | POA: Diagnosis not present

## 2016-06-13 DIAGNOSIS — J45909 Unspecified asthma, uncomplicated: Secondary | ICD-10-CM | POA: Insufficient documentation

## 2016-06-13 DIAGNOSIS — I5032 Chronic diastolic (congestive) heart failure: Secondary | ICD-10-CM | POA: Insufficient documentation

## 2016-06-13 DIAGNOSIS — X58XXXA Exposure to other specified factors, initial encounter: Secondary | ICD-10-CM | POA: Diagnosis not present

## 2016-06-13 DIAGNOSIS — S0993XA Unspecified injury of face, initial encounter: Secondary | ICD-10-CM | POA: Diagnosis present

## 2016-06-13 DIAGNOSIS — I130 Hypertensive heart and chronic kidney disease with heart failure and stage 1 through stage 4 chronic kidney disease, or unspecified chronic kidney disease: Secondary | ICD-10-CM | POA: Diagnosis not present

## 2016-06-13 DIAGNOSIS — S0300XA Dislocation of jaw, unspecified side, initial encounter: Secondary | ICD-10-CM | POA: Insufficient documentation

## 2016-06-13 DIAGNOSIS — Z79899 Other long term (current) drug therapy: Secondary | ICD-10-CM | POA: Diagnosis not present

## 2016-06-13 DIAGNOSIS — Y92129 Unspecified place in nursing home as the place of occurrence of the external cause: Secondary | ICD-10-CM | POA: Diagnosis not present

## 2016-06-13 DIAGNOSIS — Y999 Unspecified external cause status: Secondary | ICD-10-CM | POA: Diagnosis not present

## 2016-06-13 DIAGNOSIS — N189 Chronic kidney disease, unspecified: Secondary | ICD-10-CM | POA: Diagnosis not present

## 2016-06-13 NOTE — ED Triage Notes (Signed)
Pt. Here from [facility] for dislocated jaw. EDP at bedside reducing jaw. Pt. Hx of same.

## 2016-06-13 NOTE — Discharge Instructions (Signed)
Return to the emergency department if you experience additional problems.

## 2016-06-13 NOTE — ED Provider Notes (Signed)
[name] DEPT Provider Note   CSN: [id number] Arrival date & time: 06/13/16  0930     History   Chief Complaint Chief Complaint  Patient presents with  . Jaw Pain    HPI Yvonne Buck is a 83 y.o. female.  Patient is an 83 year old female with extensive past medical history. She is a resident of a nursing home and is currently under hospice care. She is brought today for evaluation of mandible dislocation. She has a history of frequent dislocations and is brought to the ER frequently with this complaint.   The history is provided by the patient.    Past Medical History:  Diagnosis Date  . Anxiety   . Asthma   . Chronic kidney disease   . Depression   . Fracture of greater trochanter of left femur ([place]) 04/02/2013  . Frequent falls   . Hip fracture, left ([place]) 04/02/2013  . Hyperlipidemia   . Hypertension   . Hypothyroidism   . Osteoporosis 06/23/2013  . Pacemaker   . Paroxysmal atrial fibrillation (HCC)    chads2vasc score is at least 6.  She is not a candidate for anticoagulation due to falls.  Her AF burden is low.  . Pelvic fracture ([place]) 06/23/2013  . Rhinitis, allergic 02/26/2014  . Senile osteoporosis 02/26/2014  . Sick sinus syndrome ([place])   . Stroke ([place])   . Tachycardia-bradycardia syndrome [facility])    s/p [employer] PPM implant in [place]  . TMJ (dislocation of temporomandibular joint) 06/16/2014  . Vitamin D deficiency     Patient Active Problem List   Diagnosis Date Noted  . Benign hypertensive heart disease with CHF (congestive heart failure) ([place]) 01/18/2016  . Dysphagia 06/22/2015  . Dementia with behavioral disturbance 04/23/2015  . Vitamin D deficiency 04/23/2015  . Protein-calorie malnutrition ([place]) 04/23/2015  . Insomnia 12/28/2014  . Chronic diastolic congestive heart failure ([place]) 10/15/2014  . Hypertensive heart and renal disease 10/15/2014  . Frequent falls 08/07/2014  . Protein-calorie malnutrition, severe ([place]) 06/22/2014  . Weakness  06/17/2014  . Steroid-induced hyperglycemia 06/17/2014  . TMJ (dislocation of temporomandibular joint) 06/16/2014  . Hypokalemia 06/16/2014  . Sick sinus syndrome ([place]) 03/24/2014  . Hyperlipidemia 02/26/2014  . Senile osteoporosis 02/26/2014  . Asthmatic bronchitis 02/26/2014  . Rhinitis, allergic 02/26/2014  . Chronic asthma 06/16/2013  . Depression with anxiety 02/05/2013  . Gait instability 01/14/2013  . Left spastic hemiparesis ([place]) 11/16/2012  . Tachycardia-bradycardia syndrome ([place]) 01/06/2011  . Atrial fibrillation ([place]) 01/06/2011  . Hypothyroidism 12/10/2010    Past Surgical History:  Procedure Laterality Date  . CHOLECYSTECTOMY    . EP IMPLANTABLE DEVICE N/A 09/03/2015   Gen change with a St Jude Mediacl AssurityMRI DR pacemaker  . GALLBLADDER SURGERY    . PACEMAKER INSERTION  01/04/2005   [employer] [place] PPM 1290 [phone]), GDT [id number] atrial lead and 4457 V lead all implanted in [place]      OB History    No data available       Home Medications    Prior to Admission medications   Medication Sig Start Date End Date Taking? Authorizing Provider  acetaminophen (TYLENOL) 325 MG tablet Take 650 mg by mouth every 4 (four) hours as needed for mild pain or moderate pain.    Historical Provider, MD  acetaminophen (TYLENOL) 500 MG tablet Take 1,000 mg by mouth 3 (three) times daily as needed for mild pain or moderate pain.     Historical  Provider, MD  amLODipine (NORVASC) 5 MG tablet Take 5 mg by mouth daily.  08/21/14   Historical Provider, MD  aspirin EC 81 MG tablet Take 81 mg by mouth at bedtime. for AFIB    Historical Provider, MD  cholecalciferol (VITAMIN D) 1000 UNITS tablet Take 1,000 Units by mouth at bedtime. for vitamin D deficiency    Historical Provider, MD  Fluticasone-Salmeterol (ADVAIR) 250-50 MCG/DOSE AEPB Inhale 1 puff into the lungs 2 (two) times daily. for SOB/Wheeze. Rinse mouth after use.    Historical  Provider, MD  isosorbide mononitrate (IMDUR) 30 MG 24 hr tablet Take 30 mg by mouth every morning. for hypertension    Historical Provider, MD  losartan (COZAAR) 100 MG tablet Take 100 mg by mouth daily.     Historical Provider, MD  memantine (NAMENDA XR) 28 MG CP24 24 hr capsule Take 28 mg by mouth daily.    Historical Provider, MD  metoprolol (LOPRESSOR) 50 MG tablet Take 50 mg by mouth 2 (two) times daily.     Historical Provider, MD  mirtazapine (REMERON) 7.5 MG tablet Take 7.5 mg by mouth at bedtime.    Historical Provider, MD  montelukast (SINGULAIR) 10 MG tablet Take 10 mg by mouth at bedtime. for allergies    Historical Provider, MD  morphine (ROXANOL) 20 MG/ML concentrated solution Take 5 mg by mouth every 8 (eight) hours as needed for severe pain.    Historical Provider, MD  Multiple Vitamin (MULTIVITAMIN) tablet Take 1 tablet by mouth daily.    Historical Provider, MD  OXYGEN Inhale 2 mLs into the lungs as needed (to keep sats above 90%).     Historical Provider, MD  potassium chloride (K-DUR,KLOR-CON) 10 MEQ tablet Take 10 mEq by mouth daily.    Historical Provider, MD  simvastatin (ZOCOR) 10 MG tablet Take 30 mg by mouth at bedtime.  04/25/15   Historical Provider, MD  UNABLE TO FIND Take 90 mLs by mouth 2 (two) times daily. Med Name: Med Pass    Historical Provider, MD    Family History Family History  Problem Relation Age of Onset  . Diabetes Mother   . High blood pressure Mother   . Heart Problems Father     Social History Social History  Substance Use Topics  . Smoking status: Never Smoker  . Smokeless tobacco: Never Used  . Alcohol use No     Allergies   Penicillins   Review of Systems Review of Systems  All other systems reviewed and are negative.    Physical Exam Updated Vital Signs BP (!) 143/63   Pulse (!) 59   Temp 98.2 F (36.8 C) (Axillary)   Resp 14   Ht 5\' 5"  (1.651 m)   Wt 89 lb (40.4 kg)   SpO2 96%   BMI 14.81 kg/m   Physical Exam    Constitutional: She appears well-developed and well-nourished. No distress.  HENT:  Head: Normocephalic and atraumatic.  The mandible appears to be dislocated anteriorly at the TMJ.  Neck: Normal range of motion. Neck supple.  Pulmonary/Chest: Effort normal.  Neurological: She is alert.  Patient is awake and alert, however not oriented.  Skin: She is not diaphoretic.  Nursing note and vitals reviewed.    ED Treatments / Results  Labs (all labs ordered are listed, but only abnormal results are displayed) Labs Reviewed - No data to display  EKG  EKG Interpretation None       Radiology No results found.  Procedures  Procedures (including critical care time)  Medications Ordered in ED Medications - No data to display   Initial Impression / Assessment and Plan / ED Course  I have reviewed the triage vital signs and the nursing notes.  Pertinent labs & imaging results that were available during my care of the patient were reviewed by me and considered in my medical decision making (see chart for details).  The mandible was successfully reduced with downward traction. No further workup indicated. She will be discharged, to return as needed for any problems.  Final Clinical Impressions(s) / ED Diagnoses   Final diagnoses:  None    New Prescriptions New Prescriptions   No medications on file     Veryl Speak, MD 06/13/16 [phone]

## 2016-06-20 ENCOUNTER — Emergency Department (HOSPITAL_COMMUNITY)
Admission: EM | Admit: 2016-06-20 | Discharge: 2016-06-20 | Disposition: A | Discharge status: Home / Self Care | Source: Home / Self Care | Attending: Emergency Medicine | Admitting: Emergency Medicine

## 2016-06-20 ENCOUNTER — Emergency Department (HOSPITAL_COMMUNITY)
Admission: EM | Admit: 2016-06-20 | Discharge: 2016-06-20 | Disposition: A | Discharge status: Home / Self Care | Attending: Emergency Medicine | Admitting: Emergency Medicine

## 2016-06-20 ENCOUNTER — Encounter (HOSPITAL_COMMUNITY): Payer: Self-pay

## 2016-06-20 ENCOUNTER — Encounter (HOSPITAL_COMMUNITY): Payer: Self-pay | Admitting: Emergency Medicine

## 2016-06-20 DIAGNOSIS — Y999 Unspecified external cause status: Secondary | ICD-10-CM

## 2016-06-20 DIAGNOSIS — I130 Hypertensive heart and chronic kidney disease with heart failure and stage 1 through stage 4 chronic kidney disease, or unspecified chronic kidney disease: Secondary | ICD-10-CM | POA: Insufficient documentation

## 2016-06-20 DIAGNOSIS — Y939 Activity, unspecified: Secondary | ICD-10-CM

## 2016-06-20 DIAGNOSIS — Z950 Presence of cardiac pacemaker: Secondary | ICD-10-CM | POA: Insufficient documentation

## 2016-06-20 DIAGNOSIS — I5032 Chronic diastolic (congestive) heart failure: Secondary | ICD-10-CM | POA: Insufficient documentation

## 2016-06-20 DIAGNOSIS — X58XXXA Exposure to other specified factors, initial encounter: Secondary | ICD-10-CM

## 2016-06-20 DIAGNOSIS — Z8673 Personal history of transient ischemic attack (TIA), and cerebral infarction without residual deficits: Secondary | ICD-10-CM

## 2016-06-20 DIAGNOSIS — N189 Chronic kidney disease, unspecified: Secondary | ICD-10-CM | POA: Insufficient documentation

## 2016-06-20 DIAGNOSIS — Z7982 Long term (current) use of aspirin: Secondary | ICD-10-CM | POA: Insufficient documentation

## 2016-06-20 DIAGNOSIS — S0300XA Dislocation of jaw, unspecified side, initial encounter: Secondary | ICD-10-CM | POA: Insufficient documentation

## 2016-06-20 DIAGNOSIS — J45909 Unspecified asthma, uncomplicated: Secondary | ICD-10-CM | POA: Insufficient documentation

## 2016-06-20 DIAGNOSIS — Y929 Unspecified place or not applicable: Secondary | ICD-10-CM

## 2016-06-20 DIAGNOSIS — S0303XA Dislocation of jaw, bilateral, initial encounter: Secondary | ICD-10-CM | POA: Diagnosis not present

## 2016-06-20 DIAGNOSIS — E039 Hypothyroidism, unspecified: Secondary | ICD-10-CM | POA: Insufficient documentation

## 2016-06-20 NOTE — ED Notes (Signed)
Placed dressing around chin to secure jaw in place.

## 2016-06-20 NOTE — ED Triage Notes (Signed)
Pt BIB EMS from SNF with dislocated jaw. No information given to EMS unit.

## 2016-06-20 NOTE — ED Notes (Signed)
Pt transported to [facility]

## 2016-06-20 NOTE — ED Triage Notes (Addendum)
Per EMS, pt from [facility].  Jaw dislocation.  Chronic.  Vitals: 124/80, hr 60, resp 18, cbg 125

## 2016-06-20 NOTE — ED Provider Notes (Signed)
[facility] DEPT Provider Note   CSN: [id number] Arrival date & time: 06/20/16  0404     History   Chief Complaint Chief Complaint  Patient presents with  . Dislocation    HPI Yvonne Buck is a 83 y.o. female.  Patient is an 83 year old female with past medical history of dementia and recurrent mandible dislocation. She was brought from her extended care facility for evaluation of a recurrence of this. Patient adds no additional history secondary to dementia.   The history is provided by the EMS personnel and the nursing home.    Past Medical History:  Diagnosis Date  . Anxiety   . Asthma   . Chronic kidney disease   . Depression   . Fracture of greater trochanter of left femur ([place]) 04/02/2013  . Frequent falls   . Hip fracture, left ([place]) 04/02/2013  . Hyperlipidemia   . Hypertension   . Hypothyroidism   . Osteoporosis 06/23/2013  . Pacemaker   . Paroxysmal atrial fibrillation (H[facility])    chads2vasc score is at least 6.  She is not a candidate for anticoagulation due to falls.  Her AF burden is low.  . Pelvic fracture ([place]) 06/23/2013  . Rhinitis, allergic 02/26/2014  . Senile osteoporosis 02/26/2014  . Sick sinus syndrome ([place])   . Stroke ([place])   . Tachycardia-bradycardia syndrome [facility])    s/p [employer] PPM implant in [place]  . TMJ (dislocation of temporomandibular joint) 06/16/2014  . Vitamin D deficiency     Patient Active Problem List   Diagnosis Date Noted  . Benign hypertensive heart disease with CHF (congestive heart failure) ([place]) 01/18/2016  . Dysphagia 06/22/2015  . Dementia with behavioral disturbance 04/23/2015  . Vitamin D deficiency 04/23/2015  . Protein-calorie malnutrition ([place]) 04/23/2015  . Insomnia 12/28/2014  . Chronic diastolic congestive heart failure ([place]) 10/15/2014  . Hypertensive heart and renal disease 10/15/2014  . Frequent falls 08/07/2014  . Protein-calorie malnutrition, severe ([place]) 06/22/2014  . Weakness 06/17/2014  .  Steroid-induced hyperglycemia 06/17/2014  . TMJ (dislocation of temporomandibular joint) 06/16/2014  . Hypokalemia 06/16/2014  . Sick sinus syndrome ([place]) 03/24/2014  . Hyperlipidemia 02/26/2014  . Senile osteoporosis 02/26/2014  . Asthmatic bronchitis 02/26/2014  . Rhinitis, allergic 02/26/2014  . Chronic asthma 06/16/2013  . Depression with anxiety 02/05/2013  . Gait instability 01/14/2013  . Left spastic hemiparesis ([place]) 11/16/2012  . Tachycardia-bradycardia syndrome ([place]) 01/06/2011  . Atrial fibrillation ([place]) 01/06/2011  . Hypothyroidism 12/10/2010    Past Surgical History:  Procedure Laterality Date  . CHOLECYSTECTOMY    . EP IMPLANTABLE DEVICE N/A 09/03/2015   Gen change with a St Jude Mediacl AssurityMRI DR pacemaker  . GALLBLADDER SURGERY    . PACEMAKER INSERTION  01/04/2005   [employer] [place] PPM [phone]), GDT [phone] atrial lead and 4457 V lead all implanted in [place]      OB History    No data available       Home Medications    Prior to Admission medications   Medication Sig Start Date End Date Taking? Authorizing Provider  acetaminophen (TYLENOL) 325 MG tablet Take 650 mg by mouth every 4 (four) hours as needed for mild pain or moderate pain.    Historical Provider, MD  acetaminophen (TYLENOL) 500 MG tablet Take 1,000 mg by mouth 3 (three) times daily as needed for mild pain or moderate pain.     Historical Provider, MD  amLODipine (NORVASC) 5 MG tablet  Take 5 mg by mouth daily.  08/21/14   Historical Provider, MD  aspirin EC 81 MG tablet Take 81 mg by mouth at bedtime. for AFIB    Historical Provider, MD  cholecalciferol (VITAMIN D) 1000 UNITS tablet Take 1,000 Units by mouth at bedtime. for vitamin D deficiency    Historical Provider, MD  Fluticasone-Salmeterol (ADVAIR) 250-50 MCG/DOSE AEPB Inhale 1 puff into the lungs 2 (two) times daily. for SOB/Wheeze. Rinse mouth after use.    Historical Provider, MD    isosorbide mononitrate (IMDUR) 30 MG 24 hr tablet Take 30 mg by mouth every morning. for hypertension    Historical Provider, MD  losartan (COZAAR) 100 MG tablet Take 100 mg by mouth daily.     Historical Provider, MD  memantine (NAMENDA XR) 28 MG CP24 24 hr capsule Take 28 mg by mouth daily.    Historical Provider, MD  metoprolol (LOPRESSOR) 50 MG tablet Take 50 mg by mouth 2 (two) times daily.     Historical Provider, MD  mirtazapine (REMERON) 7.5 MG tablet Take 7.5 mg by mouth at bedtime.    Historical Provider, MD  montelukast (SINGULAIR) 10 MG tablet Take 10 mg by mouth at bedtime. for allergies    Historical Provider, MD  morphine (ROXANOL) 20 MG/ML concentrated solution Take 5 mg by mouth every 8 (eight) hours as needed for severe pain.    Historical Provider, MD  Multiple Vitamin (MULTIVITAMIN) tablet Take 1 tablet by mouth daily.    Historical Provider, MD  OXYGEN Inhale 2 mLs into the lungs as needed (to keep sats above 90%).     Historical Provider, MD  potassium chloride (K-DUR,KLOR-CON) 10 MEQ tablet Take 10 mEq by mouth daily.    Historical Provider, MD  simvastatin (ZOCOR) 10 MG tablet Take 30 mg by mouth at bedtime.  04/25/15   Historical Provider, MD  UNABLE TO FIND Take 90 mLs by mouth 2 (two) times daily. Med Name: Med Pass    Historical Provider, MD    Family History Family History  Problem Relation Age of Onset  . Diabetes Mother   . High blood pressure Mother   . Heart Problems Father     Social History Social History  Substance Use Topics  . Smoking status: Never Smoker  . Smokeless tobacco: Never Used  . Alcohol use No     Allergies   Penicillins   Review of Systems Review of Systems  All other systems reviewed and are negative.    Physical Exam Updated Vital Signs There were no vitals taken for this visit.  Physical Exam  Constitutional: She appears well-developed and well-nourished. No distress.  HENT:  Head: Normocephalic and atraumatic.   There is an obvious TMJ dislocation bilaterally. Mouth is held in an open position with the lower jaw displaced anteriorly.  Neck: Normal range of motion. Neck supple.  Pulmonary/Chest: No respiratory distress.  Neurological: She is alert.  Skin: Skin is warm and dry. She is not diaphoretic.  Nursing note and vitals reviewed.    ED Treatments / Results  Labs (all labs ordered are listed, but only abnormal results are displayed) Labs Reviewed - No data to display  EKG  EKG Interpretation None       Radiology No results found.  Procedures Procedures (including critical care time)  Medications Ordered in ED Medications - No data to display   Initial Impression / Assessment and Plan / ED Course  I have reviewed the triage vital signs and the  nursing notes.  Pertinent labs & imaging results that were available during my care of the patient were reviewed by me and considered in my medical decision making (see chart for details).  Successful reduction was performed using downward traction and backward relocation. To follow-up as needed.  Final Clinical Impressions(s) / ED Diagnoses   Final diagnoses:  None    New Prescriptions New Prescriptions   No medications on file     Veryl Speak, MD 06/20/16 [phone]

## 2016-06-20 NOTE — ED Notes (Signed)
Bed: CC61 Expected date:  Expected time:  Means of arrival:  Comments: 83 yo, jaw dislocation

## 2016-06-20 NOTE — ED Provider Notes (Signed)
[facility] DEPT Provider Note   CSN: [id number] Arrival date & time: 06/20/16  0903     History   Chief Complaint Chief Complaint  Patient presents with  . Dislocation    HPI Yvonne Buck is a 83 y.o. female.  83 year old female with history of dementia who presents with recurrent jaw dislocation. Old records reviewed and patient is seen here a few hours ago for similar issue and her jaw was reduced. Patient is unable to keep her mouth closed and has chronic dislocations. Patient is noncommunicative and all the history is per EMS as well as the old records.      Past Medical History:  Diagnosis Date  . Anxiety   . Asthma   . Chronic kidney disease   . Depression   . Fracture of greater trochanter of left femur ([place]) 04/02/2013  . Frequent falls   . Hip fracture, left ([place]) 04/02/2013  . Hyperlipidemia   . Hypertension   . Hypothyroidism   . Osteoporosis 06/23/2013  . Pacemaker   . Paroxysmal atrial fibrillation (H[facility])    chads2vasc score is at least 6.  She is not a candidate for anticoagulation due to falls.  Her AF burden is low.  . Pelvic fracture ([place]) 06/23/2013  . Rhinitis, allergic 02/26/2014  . Senile osteoporosis 02/26/2014  . Sick sinus syndrome ([place])   . Stroke ([place])   . Tachycardia-bradycardia syndrome [facility])    s/p [employer] PPM implant in [place]  . TMJ (dislocation of temporomandibular joint) 06/16/2014  . Vitamin D deficiency     Patient Active Problem List   Diagnosis Date Noted  . Benign hypertensive heart disease with CHF (congestive heart failure) ([place]) 01/18/2016  . Dysphagia 06/22/2015  . Dementia with behavioral disturbance 04/23/2015  . Vitamin D deficiency 04/23/2015  . Protein-calorie malnutrition ([place]) 04/23/2015  . Insomnia 12/28/2014  . Chronic diastolic congestive heart failure ([place]) 10/15/2014  . Hypertensive heart and renal disease 10/15/2014  . Frequent falls 08/07/2014  . Protein-calorie malnutrition, severe ([place]) 06/22/2014    . Weakness 06/17/2014  . Steroid-induced hyperglycemia 06/17/2014  . TMJ (dislocation of temporomandibular joint) 06/16/2014  . Hypokalemia 06/16/2014  . Sick sinus syndrome ([place]) 03/24/2014  . Hyperlipidemia 02/26/2014  . Senile osteoporosis 02/26/2014  . Asthmatic bronchitis 02/26/2014  . Rhinitis, allergic 02/26/2014  . Chronic asthma 06/16/2013  . Depression with anxiety 02/05/2013  . Gait instability 01/14/2013  . Left spastic hemiparesis ([place]) 11/16/2012  . Tachycardia-bradycardia syndrome ([place]) 01/06/2011  . Atrial fibrillation ([place]) 01/06/2011  . Hypothyroidism 12/10/2010    Past Surgical History:  Procedure Laterality Date  . CHOLECYSTECTOMY    . EP IMPLANTABLE DEVICE N/A 09/03/2015   Gen change with a St Jude Mediacl AssurityMRI DR pacemaker  . GALLBLADDER SURGERY    . PACEMAKER INSERTION  01/04/2005   [employer] [place] PPM [phone]), GDT [id number] atrial lead and 4457 V lead all implanted in [place]      OB History    No data available       Home Medications    Prior to Admission medications   Medication Sig Start Date End Date Taking? Authorizing Provider  acetaminophen (TYLENOL) 500 MG tablet Take 1,000 mg by mouth 3 (three) times daily as needed for mild pain or moderate pain.    Yes Historical Provider, MD  amLODipine (NORVASC) 5 MG tablet Take 5 mg by mouth daily.  08/21/14  Yes Historical Provider, MD  aspirin EC 81 MG  tablet Take 81 mg by mouth at bedtime. for AFIB   Yes Historical Provider, MD  cholecalciferol (VITAMIN D) 1000 UNITS tablet Take 1,000 Units by mouth at bedtime. for vitamin D deficiency   Yes Historical Provider, MD  Fluticasone-Salmeterol (ADVAIR) 250-50 MCG/DOSE AEPB Inhale 1 puff into the lungs 2 (two) times daily. for SOB/Wheeze. Rinse mouth after use.   Yes Historical Provider, MD  isosorbide mononitrate (IMDUR) 30 MG 24 hr tablet Take 30 mg by mouth every morning. for hypertension   Yes  Historical Provider, MD  losartan (COZAAR) 100 MG tablet Take 100 mg by mouth daily.    Yes Historical Provider, MD  metoprolol (LOPRESSOR) 50 MG tablet Take 50 mg by mouth 2 (two) times daily.    Yes Historical Provider, MD  mirtazapine (REMERON) 7.5 MG tablet Take 7.5 mg by mouth at bedtime.   Yes Historical Provider, MD  montelukast (SINGULAIR) 10 MG tablet Take 10 mg by mouth at bedtime. for allergies   Yes Historical Provider, MD  morphine (ROXANOL) 20 MG/ML concentrated solution Take 5 mg by mouth every 8 (eight) hours as needed for severe pain.   Yes Historical Provider, MD  Multiple Vitamin (MULTIVITAMIN) tablet Take 1 tablet by mouth daily.   Yes Historical Provider, MD  OXYGEN Inhale 2 mLs into the lungs as needed (to keep sats above 90%).    Yes Historical Provider, MD  potassium chloride (K-DUR,KLOR-CON) 10 MEQ tablet Take 10 mEq by mouth daily.   Yes Historical Provider, MD  sertraline (ZOLOFT) 20 MG/ML concentrated solution Take 20 mg by mouth daily.   Yes Historical Provider, MD  simvastatin (ZOCOR) 10 MG tablet Take 30 mg by mouth at bedtime.  04/25/15  Yes Historical Provider, MD  UNABLE TO FIND Take 90 mLs by mouth 2 (two) times daily. Med Name: Med Pass   Yes Historical Provider, MD    Family History Family History  Problem Relation Age of Onset  . Diabetes Mother   . High blood pressure Mother   . Heart Problems Father     Social History Social History  Substance Use Topics  . Smoking status: Never Smoker  . Smokeless tobacco: Never Used  . Alcohol use No     Allergies   Penicillins   Review of Systems Review of Systems  Unable to perform ROS: Dementia     Physical Exam Updated Vital Signs BP (!) 151/74   Pulse 60   Temp 97.2 F (36.2 C) (Axillary)   Resp 16   SpO2 100%   Physical Exam  Constitutional: She appears well-developed and well-nourished.  Non-toxic appearance. No distress.  HENT:  Head: Normocephalic and atraumatic.  Mouth open with  apparent mandibular dislocation. Nontender at the TMJs.  Eyes: Conjunctivae, EOM and lids are normal. Pupils are equal, round, and reactive to light.  Neck: Normal range of motion. Neck supple. No tracheal deviation present. No thyroid mass present.  Cardiovascular: Normal rate, regular rhythm and normal heart sounds.  Exam reveals no gallop.   No murmur heard. Pulmonary/Chest: Effort normal and breath sounds normal. No stridor. No respiratory distress. She has no decreased breath sounds. She has no wheezes. She has no rhonchi. She has no rales.  Abdominal: Soft. Normal appearance and bowel sounds are normal. She exhibits no distension. There is no tenderness. There is no rebound and no CVA tenderness.  Musculoskeletal: Normal range of motion. She exhibits no edema or tenderness.  Neurological: She is alert. She is disoriented. No cranial nerve  deficit. GCS eye subscore is 4. GCS verbal subscore is 4. GCS motor subscore is 5.  Skin: Skin is warm and dry. No abrasion and no rash noted.  Nursing note and vitals reviewed.    ED Treatments / Results  Labs (all labs ordered are listed, but only abnormal results are displayed) Labs Reviewed - No data to display  EKG  EKG Interpretation None       Radiology No results found.  Procedures Procedures (including critical care time)  Medications Ordered in ED Medications - No data to display   Initial Impression / Assessment and Plan / ED Course  I have reviewed the triage vital signs and the nursing notes.  Pertinent labs & imaging results that were available during my care of the patient were reviewed by me and considered in my medical decision making (see chart for details).     Patient's mandible relocated and she is able to close her mouth properly. I had a long discussion with the nursing administrator at her facility and have encouraged them to relocate the patient's job there as this is a chronic issue and the patient's  mandible will continue to dislocate when she opens her mouth. I gave the administrator my phone number to have the physician or nurse practitioner call me directly.  Final Clinical Impressions(s) / ED Diagnoses   Final diagnoses:  None    New Prescriptions New Prescriptions   No medications on file     Lacretia Leigh, MD 06/20/16 [phone]

## 2016-06-20 NOTE — ED Notes (Signed)
Pt departed in NAD, in care of [facility] 

## 2016-07-22 DEATH — deceased

## 2017-01-15 IMAGING — CR DG MANDIBLE 1-3V
5 series · 5 of 5 positions shown · non-contrast
Comparison: Facial bone CT 06/20/2014

CLINICAL DATA: Chronic mandibular dislocation post reduction

EXAM:
MANDIBLE - 1-3 VIEW

[w mandible pa (1 of 3)]
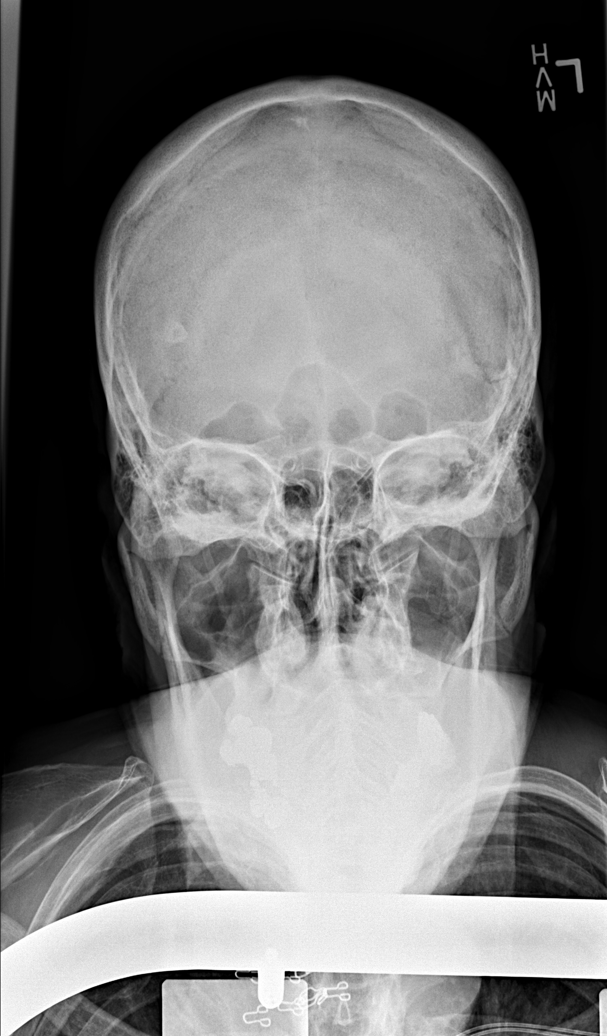

[w mandible pa (2 of 3)]
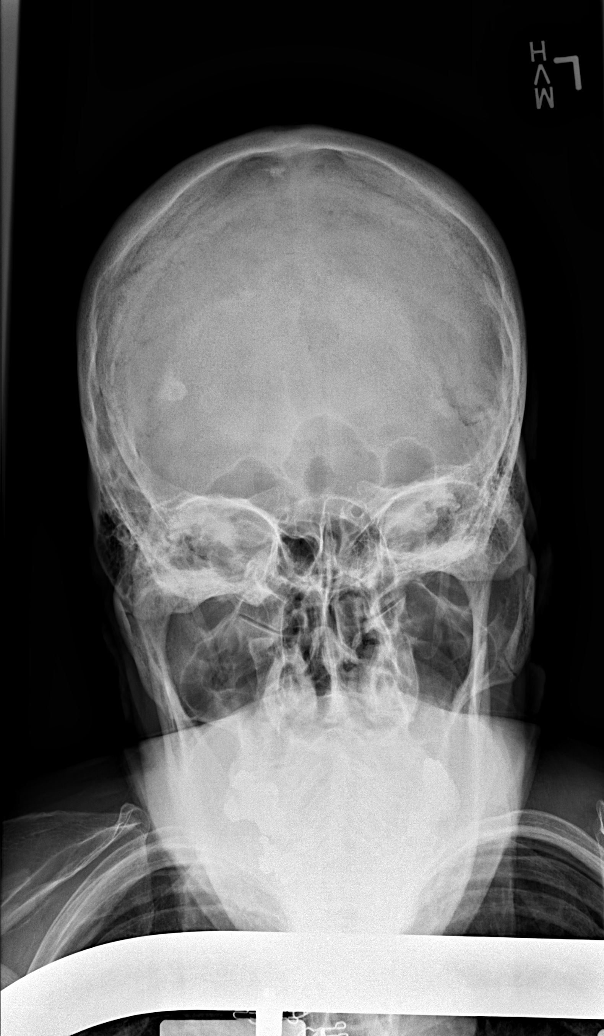

[w mandible pa (3 of 3)]
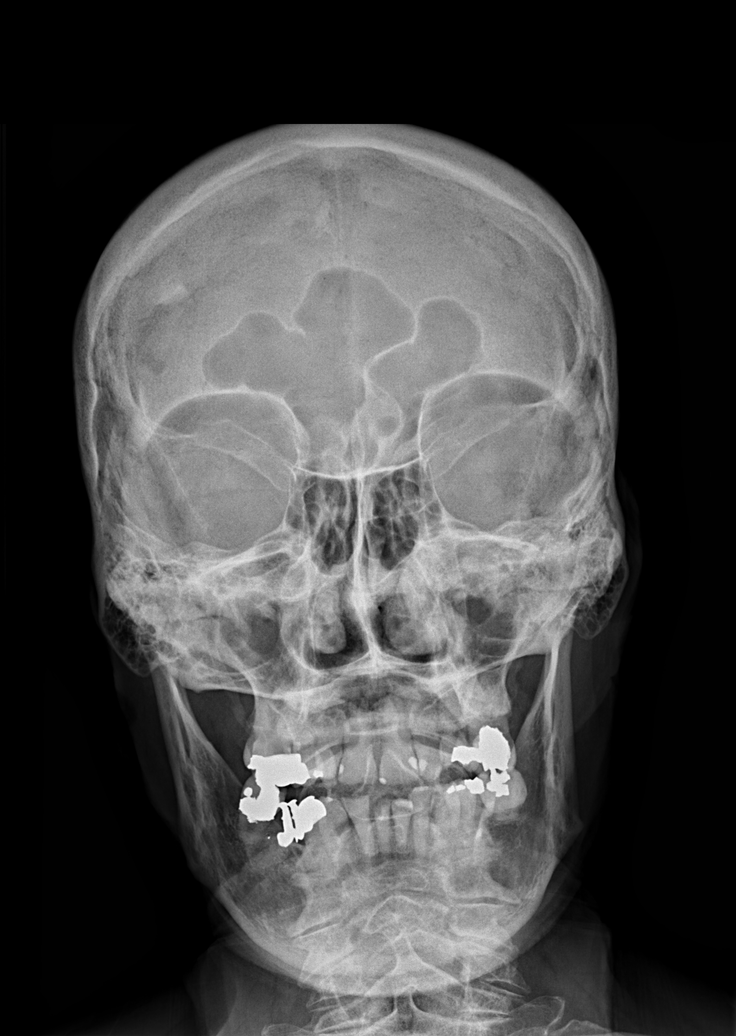

[w mandible lat (1 of 2)]
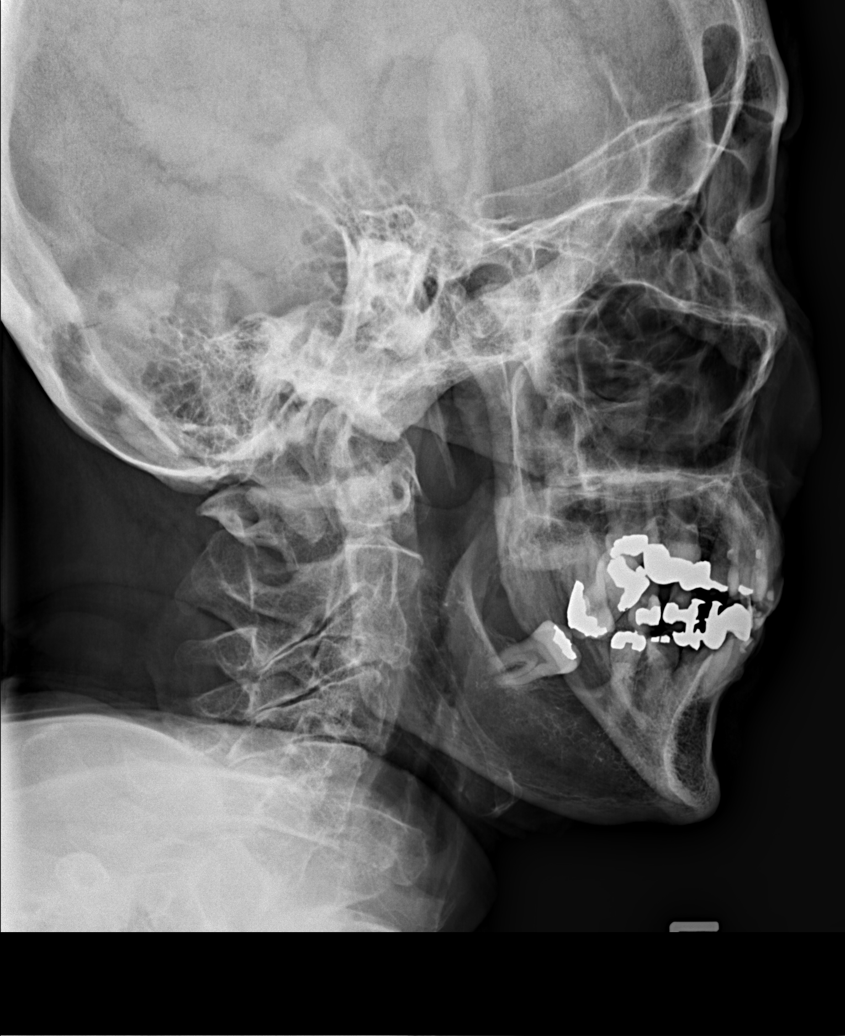

[w mandible lat (2 of 2)]
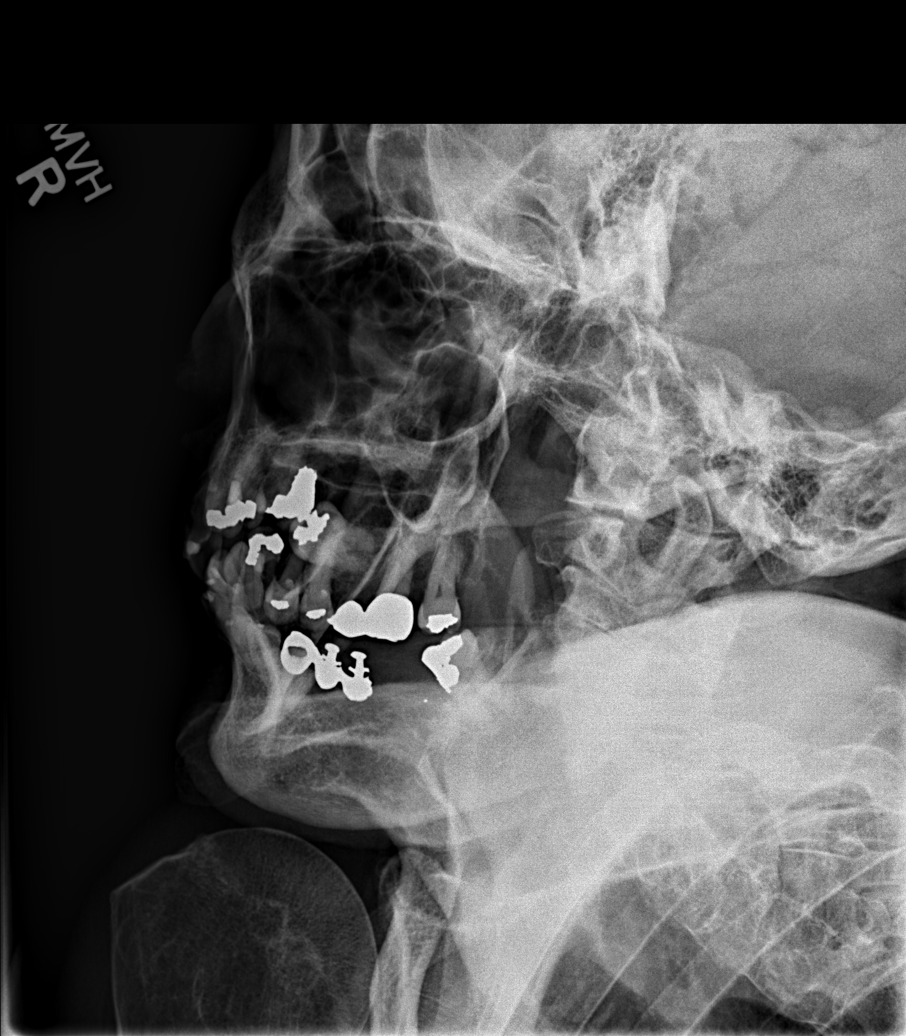

[5 of 5 positions shown; findings below may reference images not displayed]

FINDINGS: Osseous mineralization normal.

Temporomandibular joint alignments grossly normal.

No definite fracture, dislocation or bone destruction.

Visualized paranasal sinuses clear.
IMPRESSION: No acute mandibular abnormalities identified.
